# Patient Record
Sex: Female | Born: 1940
Health system: Southern US, Community
[De-identification: ages and names within clinical notes are randomized; demographics above are authoritative.]

## PROBLEM LIST (undated history)

## (undated) DIAGNOSIS — Z952 Presence of prosthetic heart valve: Secondary | ICD-10-CM

## (undated) DIAGNOSIS — I350 Nonrheumatic aortic (valve) stenosis: Secondary | ICD-10-CM

## (undated) DIAGNOSIS — D649 Anemia, unspecified: Secondary | ICD-10-CM

## (undated) DIAGNOSIS — I5032 Chronic diastolic (congestive) heart failure: Secondary | ICD-10-CM

## (undated) DIAGNOSIS — I700 Atherosclerosis of aorta: Secondary | ICD-10-CM

## (undated) DIAGNOSIS — J45909 Unspecified asthma, uncomplicated: Secondary | ICD-10-CM

## (undated) DIAGNOSIS — I10 Essential (primary) hypertension: Secondary | ICD-10-CM

## (undated) DIAGNOSIS — I4819 Other persistent atrial fibrillation: Secondary | ICD-10-CM

## (undated) DIAGNOSIS — I2510 Atherosclerotic heart disease of native coronary artery without angina pectoris: Secondary | ICD-10-CM

## (undated) DIAGNOSIS — E119 Type 2 diabetes mellitus without complications: Secondary | ICD-10-CM

## (undated) DIAGNOSIS — E785 Hyperlipidemia, unspecified: Secondary | ICD-10-CM

## (undated) HISTORY — PX: TUBAL LIGATION: SHX77

---

## 2014-09-03 DIAGNOSIS — I2510 Atherosclerotic heart disease of native coronary artery without angina pectoris: Secondary | ICD-10-CM

## 2014-09-03 HISTORY — DX: Atherosclerotic heart disease of native coronary artery without angina pectoris: I25.10

## 2014-09-25 ENCOUNTER — Encounter (HOSPITAL_COMMUNITY): Payer: Self-pay | Admitting: *Deleted

## 2014-09-25 ENCOUNTER — Inpatient Hospital Stay (HOSPITAL_COMMUNITY)
Admission: AD | Admit: 2014-09-25 | Discharge: 2014-09-28 | DRG: 286 | Disposition: A | Discharge status: Home / Self Care | Payer: Medicare Other | Source: Other Acute Inpatient Hospital | Attending: Cardiovascular Disease | Admitting: Cardiovascular Disease

## 2014-09-25 DIAGNOSIS — Z79899 Other long term (current) drug therapy: Secondary | ICD-10-CM | POA: Diagnosis not present

## 2014-09-25 DIAGNOSIS — I501 Left ventricular failure: Secondary | ICD-10-CM | POA: Insufficient documentation

## 2014-09-25 DIAGNOSIS — Z794 Long term (current) use of insulin: Secondary | ICD-10-CM

## 2014-09-25 DIAGNOSIS — Z7982 Long term (current) use of aspirin: Secondary | ICD-10-CM | POA: Diagnosis not present

## 2014-09-25 DIAGNOSIS — J810 Acute pulmonary edema: Secondary | ICD-10-CM

## 2014-09-25 DIAGNOSIS — R079 Chest pain, unspecified: Secondary | ICD-10-CM | POA: Diagnosis present

## 2014-09-25 DIAGNOSIS — I10 Essential (primary) hypertension: Secondary | ICD-10-CM | POA: Diagnosis present

## 2014-09-25 DIAGNOSIS — I2510 Atherosclerotic heart disease of native coronary artery without angina pectoris: Secondary | ICD-10-CM | POA: Diagnosis present

## 2014-09-25 DIAGNOSIS — I214 Non-ST elevation (NSTEMI) myocardial infarction: Secondary | ICD-10-CM

## 2014-09-25 DIAGNOSIS — Z880 Allergy status to penicillin: Secondary | ICD-10-CM | POA: Diagnosis not present

## 2014-09-25 DIAGNOSIS — E119 Type 2 diabetes mellitus without complications: Secondary | ICD-10-CM

## 2014-09-25 DIAGNOSIS — E669 Obesity, unspecified: Secondary | ICD-10-CM | POA: Diagnosis present

## 2014-09-25 DIAGNOSIS — I248 Other forms of acute ischemic heart disease: Secondary | ICD-10-CM | POA: Diagnosis present

## 2014-09-25 DIAGNOSIS — Z8249 Family history of ischemic heart disease and other diseases of the circulatory system: Secondary | ICD-10-CM | POA: Diagnosis not present

## 2014-09-25 DIAGNOSIS — I5032 Chronic diastolic (congestive) heart failure: Secondary | ICD-10-CM | POA: Diagnosis present

## 2014-09-25 DIAGNOSIS — Z789 Other specified health status: Secondary | ICD-10-CM

## 2014-09-25 DIAGNOSIS — Z882 Allergy status to sulfonamides status: Secondary | ICD-10-CM | POA: Diagnosis not present

## 2014-09-25 DIAGNOSIS — Z6841 Body Mass Index (BMI) 40.0 and over, adult: Secondary | ICD-10-CM | POA: Diagnosis not present

## 2014-09-25 DIAGNOSIS — I2489 Other forms of acute ischemic heart disease: Secondary | ICD-10-CM | POA: Diagnosis present

## 2014-09-25 DIAGNOSIS — E785 Hyperlipidemia, unspecified: Secondary | ICD-10-CM | POA: Diagnosis present

## 2014-09-25 DIAGNOSIS — Z888 Allergy status to other drugs, medicaments and biological substances status: Secondary | ICD-10-CM

## 2014-09-25 DIAGNOSIS — Z885 Allergy status to narcotic agent status: Secondary | ICD-10-CM | POA: Diagnosis not present

## 2014-09-25 DIAGNOSIS — I5031 Acute diastolic (congestive) heart failure: Secondary | ICD-10-CM | POA: Diagnosis present

## 2014-09-25 DIAGNOSIS — I5033 Acute on chronic diastolic (congestive) heart failure: Secondary | ICD-10-CM | POA: Diagnosis present

## 2014-09-25 HISTORY — DX: Essential (primary) hypertension: I10

## 2014-09-25 HISTORY — DX: Type 2 diabetes mellitus without complications: E11.9

## 2014-09-25 LAB — GLUCOSE, CAPILLARY: Glucose-Capillary: 129 mg/dL — ABNORMAL HIGH (ref 70–99)

## 2014-09-25 MED ORDER — INSULIN ASPART 100 UNIT/ML ~~LOC~~ SOLN: 0.0000 [IU] | Freq: Every day | SUBCUTANEOUS | Status: DC

## 2014-09-25 MED ORDER — ONDANSETRON HCL 4 MG/2ML IJ SOLN: 4.0000 mg | Freq: Four times a day (QID) | INTRAMUSCULAR | Status: DC | PRN

## 2014-09-25 MED ORDER — LOSARTAN POTASSIUM 50 MG PO TABS
50.0000 mg | ORAL_TABLET | Freq: Every day | ORAL | Status: DC
  Administered 2014-09-26 – 2014-09-28 (×3): 50 mg via ORAL
  Filled 2014-09-25 (×3): qty 1

## 2014-09-25 MED ORDER — METOPROLOL TARTRATE 50 MG PO TABS
50.0000 mg | ORAL_TABLET | Freq: Two times a day (BID) | ORAL | Status: DC
  Administered 2014-09-26 – 2014-09-27 (×4): 50 mg via ORAL
  Filled 2014-09-25 (×5): qty 1

## 2014-09-25 MED ORDER — CETYLPYRIDINIUM CHLORIDE 0.05 % MT LIQD: 7.0000 mL | Freq: Two times a day (BID) | OROMUCOSAL | Status: DC

## 2014-09-25 MED ORDER — ASPIRIN EC 81 MG PO TBEC
81.0000 mg | DELAYED_RELEASE_TABLET | Freq: Every day | ORAL | Status: DC
  Administered 2014-09-26: 81 mg via ORAL
  Filled 2014-09-25: qty 1

## 2014-09-25 MED ORDER — LOSARTAN POTASSIUM-HCTZ 50-12.5 MG PO TABS: 1.0000 | ORAL_TABLET | Freq: Every day | ORAL | Status: DC

## 2014-09-25 MED ORDER — FUROSEMIDE 10 MG/ML IJ SOLN
80.0000 mg | Freq: Once | INTRAMUSCULAR | Status: AC
  Administered 2014-09-26: 80 mg via INTRAVENOUS
  Filled 2014-09-25: qty 8

## 2014-09-25 MED ORDER — ACETAMINOPHEN 325 MG PO TABS
650.0000 mg | ORAL_TABLET | ORAL | Status: DC | PRN
  Administered 2014-09-26: 650 mg via ORAL
  Filled 2014-09-25: qty 2

## 2014-09-25 MED ORDER — INSULIN GLARGINE 100 UNIT/ML ~~LOC~~ SOLN
25.0000 [IU] | Freq: Every day | SUBCUTANEOUS | Status: DC
  Administered 2014-09-26 – 2014-09-28 (×3): 25 [IU] via SUBCUTANEOUS
  Filled 2014-09-25 (×3): qty 0.25

## 2014-09-25 MED ORDER — NITROGLYCERIN 0.4 MG SL SUBL: 0.4000 mg | SUBLINGUAL_TABLET | SUBLINGUAL | Status: DC | PRN

## 2014-09-25 MED ORDER — OMEGA-3-ACID ETHYL ESTERS 1 G PO CAPS
3.0000 g | ORAL_CAPSULE | Freq: Two times a day (BID) | ORAL | Status: DC
  Administered 2014-09-26 – 2014-09-28 (×5): 3 g via ORAL
  Filled 2014-09-25 (×8): qty 3

## 2014-09-25 MED ORDER — HYDROCHLOROTHIAZIDE 12.5 MG PO CAPS
12.5000 mg | ORAL_CAPSULE | Freq: Every day | ORAL | Status: DC
  Administered 2014-09-26 – 2014-09-28 (×3): 12.5 mg via ORAL
  Filled 2014-09-25 (×3): qty 1

## 2014-09-25 MED ORDER — INSULIN ASPART 100 UNIT/ML ~~LOC~~ SOLN
0.0000 [IU] | Freq: Three times a day (TID) | SUBCUTANEOUS | Status: DC
  Administered 2014-09-26: 3 [IU] via SUBCUTANEOUS
  Administered 2014-09-26 – 2014-09-27 (×3): 2 [IU] via SUBCUTANEOUS

## 2014-09-25 NOTE — H&P (Signed)
PCP:   No primary care provider on file.   Chief Complaint:  Chest pain and shortness of breath  HPI: This is 74 year old Caucasian female with past medical history of hypertension, hyperlipidemia, diabetes mellitus type 2 on insulin who was transferred from outside emergency department with chest pain, shortness of breath and mildly elevated troponin. Patient will show blinks no when she started having 10 out of 10 substernal heaviness and pressure as if elephant was seated in her chest. She also developed significant shortness of breath and wasn't able to catch her breath at all. Her neighbor called an ambulance and patient was transferred to the nearest emergency department. Patient reported that her symptoms lasted for at least 30 minutes until interventions in the outside emergency department. Patient's workup over there was notable for pulmonary venous congestion on the chest x-ray, unremarkable EKG, mildly elevated troponin. Start on heparin drip, given Nitropaste and transferred to [facility] The time of my evaluation patient reported mild dyspnea if she takes oxygen off, she also reported 0.5 out of 10 heaviness in her chest. Patient denied active symptoms of  PND orthopnea, lower extremity edema, frequent or prolonged palpitations, nausea vomiting, dysuria, diarrhea, fevers, chills  Review of Systems:  The patient denies anorexia, fever, weight loss,, vision loss, decreased hearing, hoarseness, chest pain, syncope, dyspnea on exertion, peripheral edema, balance deficits, hemoptysis, abdominal pain, melena, hematochezia, severe indigestion/heartburn, hematuria, incontinence, genital sores, muscle weakness, suspicious skin lesions, transient blindness, difficulty walking, depression, unusual weight change, abnormal bleeding, enlarged lymph nodes, angioedema, and breast masses.  Past Medical History: Past Medical History  Diagnosis Date  . Hypertension   . Diabetes mellitus without  complication     type 2  . Heart murmur    Past Surgical History  Procedure Laterality Date  . No past surgeries      Medications: Prior to Admission medications   Medication Sig Start Date End Date Taking? Authorizing Provider  aspirin 325 MG tablet Take 325 mg by mouth daily.   Yes Historical Provider, MD  insulin glargine (LANTUS) 100 UNIT/ML injection Inject 25 Units into the skin daily.   Yes Historical Provider, MD  losartan-hydrochlorothiazide (HYZAAR) 50-12.5 MG per tablet Take 1 tablet by mouth daily.   Yes Historical Provider, MD  metFORMIN (GLUCOPHAGE) 500 MG tablet Take 1,000 mg by mouth 2 (two) times daily with a meal.   Yes Historical Provider, MD  metoprolol (LOPRESSOR) 50 MG tablet Take 50 mg by mouth 2 (two) times daily.   Yes Historical Provider, MD  Multiple Vitamin (MULTIVITAMIN) tablet Take 1 tablet by mouth daily.   Yes Historical Provider, MD  omega-3 acid ethyl esters (LOVAZA) 1 G capsule Take 3 g by mouth 2 (two) times daily.   Yes Historical Provider, MD    Allergies:   Allergies  Allergen Reactions  . Penicillins Anaphylaxis  . Statins Other (See Comments)    Myalgias   . Codeine   . Prednisone Other (See Comments)    psychosis   . Lidocaine Palpitations  . Sulfa Antibiotics Rash    Social History:  reports that she has never smoked. She does not have any smokeless tobacco history on file. She reports that she does not drink alcohol or use illicit drugs.  Family History: Family History  Problem Relation Age of Onset  . Heart attack Mother 4  . Heart attack Brother 47    PHYSICAL EXAM:  Filed Vitals:   09/25/14 2200 09/25/14 2215 09/25/14 2230  BP:  189/78 138/69 134/71  Pulse: 92    Temp: 98.6 F (37 C)    TempSrc: Oral    Resp: 28 20 19   Height: 4\' 10"  (1.473 m)    Weight: 96.3 kg (212 lb 4.9 oz)    SpO2: 97% 98% 96%   General:  Well appearing. No respiratory difficulty HEENT: normal Neck: supple. no JVD. Carotids 2+ bilat; no  bruits. No lymphadenopathy or thryomegaly appreciated. Cor: PMI nondisplaced. Regular rate & rhythm. No rubs, gallops or murmurs. Lungs: bi-basilar crackles, B-lines bilaterally on the lung ultrasound Abdomen: soft, nontender, nondistended. No hepatosplenomegaly. No bruits or masses. Good bowel sounds. Extremities: no cyanosis, clubbing, rash, 1 +edema Neuro: alert & oriented x 3, cranial nerves grossly intact. moves all 4 extremities w/o difficulty. Affect pleasant.  Labs on Admission:  White cell count 13.60m hemoglobin 13.4; hematocrit 43.8; platelets 197 Sodium 139, potassium 4.1, chloride 101, CO2 28, BUN 22, creatinine 0.71 glucose 142, calcium 9.2, AST 42; ALT 49 albumin 3.9  EKG personally reviewed and interpreted by me: Normal sinus rhythm 69 bpm, normal axis, no acute ST deviations Chest x-ray report from outside emergency department indicated small lateral pleural effusions with signs of pulmonary venous congestion  Assessment/Plan Present on Admission:  . NSTEMI (non-ST elevated myocardial infarction) . Acute cardiogenic pulmonary edema Diabetes mellitus type 2 insulin-dependent Hyperlipidemia (stain intolerant) Hypertension  Plan: Admit to step down Continue hydrochlorothiazide, losartan, metoprolol, Lovaza for hypertension and hyperlipidemia Lasix IV 80 mg 1 for acute pulmonary edema Heparin drip Continue Nitropaste, may switch to nitroglycerin drip later Lantus continue home dose for diabetes as well as sliding scale Check TSH, A1c, BNP in the morning A.m. labs in the morning     Declin Rajan 09/25/2014, 11:25 PM

## 2014-09-26 DIAGNOSIS — I214 Non-ST elevation (NSTEMI) myocardial infarction: Secondary | ICD-10-CM

## 2014-09-26 LAB — GLUCOSE, CAPILLARY
Glucose-Capillary: 155 mg/dL — ABNORMAL HIGH (ref 70–99)
Glucose-Capillary: 144 mg/dL — ABNORMAL HIGH (ref 70–99)
Glucose-Capillary: 153 mg/dL — ABNORMAL HIGH (ref 70–99)
Glucose-Capillary: 169 mg/dL — ABNORMAL HIGH (ref 70–99)

## 2014-09-26 LAB — LIPID PANEL
Cholesterol: 219 mg/dL — ABNORMAL HIGH (ref 0–200)
HDL: 43 mg/dL (ref >39)
LDL Cholesterol: 130 mg/dL — ABNORMAL HIGH (ref 0–99)
Total CHOL/HDL Ratio: 5.1 RATIO
Triglycerides: 231 mg/dL — ABNORMAL HIGH (ref <150)
VLDL: 46 mg/dL — ABNORMAL HIGH (ref 0–40)

## 2014-09-26 LAB — BRAIN NATRIURETIC PEPTIDE: B Natriuretic Peptide: 278.5 pg/mL — ABNORMAL HIGH (ref 0.0–100.0)

## 2014-09-26 LAB — BASIC METABOLIC PANEL
Anion gap: 14 (ref 5–15)
BUN: 24 mg/dL — ABNORMAL HIGH (ref 6–23)
CO2: 28 mmol/L (ref 19–32)
Calcium: 9.4 mg/dL (ref 8.4–10.5)
Chloride: 99 mmol/L (ref 96–112)
Creatinine, Ser: 0.78 mg/dL (ref 0.50–1.10)
GFR calc Af Amer: >90 mL/min (ref >90)
GFR calc non Af Amer: 82 mL/min — ABNORMAL LOW (ref >90)
Glucose, Bld: 140 mg/dL — ABNORMAL HIGH (ref 70–99)
Potassium: 4.2 mmol/L (ref 3.5–5.1)
Sodium: 141 mmol/L (ref 135–145)

## 2014-09-26 LAB — MRSA PCR SCREENING: MRSA by PCR: NEGATIVE

## 2014-09-26 LAB — TROPONIN I
Troponin I: 0.35 ng/mL — ABNORMAL HIGH (ref <0.031)
Troponin I: 0.19 ng/mL — ABNORMAL HIGH (ref <0.031)

## 2014-09-26 LAB — HEMOGLOBIN A1C
Hgb A1c MFr Bld: 6.7 % — ABNORMAL HIGH (ref <5.7)
Mean Plasma Glucose: 146 mg/dL — ABNORMAL HIGH (ref <117)

## 2014-09-26 LAB — HEPARIN LEVEL (UNFRACTIONATED)
Heparin Unfractionated: 0.29 IU/mL — ABNORMAL LOW (ref 0.30–0.70)
Heparin Unfractionated: 0.18 IU/mL — ABNORMAL LOW (ref 0.30–0.70)

## 2014-09-26 LAB — MAGNESIUM: Magnesium: 1.7 mg/dL (ref 1.5–2.5)

## 2014-09-26 LAB — TSH: TSH: 1.94 u[IU]/mL (ref 0.350–4.500)

## 2014-09-26 MED ORDER — SODIUM CHLORIDE 0.9 % IJ SOLN: 3.0000 mL | INTRAMUSCULAR | Status: DC | PRN

## 2014-09-26 MED ORDER — SODIUM CHLORIDE 0.9 % IJ SOLN
3.0000 mL | Freq: Two times a day (BID) | INTRAMUSCULAR | Status: DC
  Administered 2014-09-26 – 2014-09-28 (×4): 3 mL via INTRAVENOUS

## 2014-09-26 MED ORDER — HEPARIN (PORCINE) IN NACL 100-0.45 UNIT/ML-% IJ SOLN
1400.0000 [IU]/h | INTRAMUSCULAR | Status: DC
  Administered 2014-09-26: 1400 [IU]/h via INTRAVENOUS
  Administered 2014-09-26: 1100 [IU]/h via INTRAVENOUS
  Filled 2014-09-26 (×5): qty 250

## 2014-09-26 MED ORDER — ASPIRIN EC 81 MG PO TBEC
81.0000 mg | DELAYED_RELEASE_TABLET | Freq: Every day | ORAL | Status: DC
Start: 2014-09-28 — End: 2014-09-28
  Administered 2014-09-28: 81 mg via ORAL
  Filled 2014-09-26: qty 1

## 2014-09-26 MED ORDER — ASPIRIN 81 MG PO CHEW
81.0000 mg | CHEWABLE_TABLET | ORAL | Status: AC
  Administered 2014-09-27: 81 mg via ORAL
  Filled 2014-09-26: qty 1

## 2014-09-26 MED ORDER — SODIUM CHLORIDE 0.9 % IV SOLN: 250.0000 mL | INTRAVENOUS | Status: DC | PRN

## 2014-09-26 NOTE — Progress Notes (Signed)
ANTICOAGULATION CONSULT NOTE - Initial Consult  Pharmacy Consult for Heparin Indication: chest pain/ACS  Allergies  Allergen Reactions  . Penicillins Anaphylaxis  . Statins Other (See Comments)    Myalgias   . Codeine   . Prednisone Other (See Comments)    psychosis   . Lidocaine Palpitations  . Sulfa Antibiotics Rash    Patient Measurements: Height: 4\' 10"  (147.3 cm) Weight: 212 lb 4.9 oz (96.3 kg) IBW/kg (Calculated) : 40.9 Heparin Dosing Weight: 65 kg  Vital Signs: Temp: 98.6 F (37 C) (01/23 2200) Temp Source: Oral (01/23 2300) BP: 145/82 mmHg (01/23 2300) Pulse Rate: 77 (01/23 2300)  Labs (at [facility]): WBC 13.1 Hgb 13.4 Hct 43.8 Plt 197  SCr 0.71   No results for input(s): HGB, HCT, PLT, APTT, LABPROT, INR, HEPARINUNFRC, CREATININE, CKTOTAL, CKMB, TROPONINI in the last 72 hours.  CrCl cannot be calculated (Patient has no serum creatinine result on file.).   Medical History: Past Medical History  Diagnosis Date  . Hypertension   . Diabetes mellitus without complication     type 2  . Heart murmur     Medications:  ASA  Lantus  Cozaar  Metformin  Lopressor  MVI  Lovaza  Assessment: 74 yo female with chest pain/SOB for heparin.  Heparin 7000 units IV bolus and 1450 units/hr started at OSH at 1945.    Goal of Therapy:  Heparin level 0.3-0.7 units/ml Monitor platelets by anticoagulation protocol: Yes   Plan:  Decrease heparin 1100 units/hr Check heparin level in 8 hours.   Eddie Candle 09/26/2014,12:02 AM

## 2014-09-26 NOTE — Progress Notes (Signed)
ANTICOAGULATION CONSULT NOTE - Follow Up Consult  Pharmacy Consult for Heparin Indication: chest pain/ACS  Allergies  Allergen Reactions  . Penicillins Anaphylaxis  . Statins Other (See Comments)    Myalgias   . Codeine   . Prednisone Other (See Comments)    psychosis   . Lidocaine Palpitations  . Sulfa Antibiotics Rash    Patient Measurements: Height: 4\' 10"  (147.3 cm) Weight: 211 lb 3.2 oz (95.8 kg) IBW/kg (Calculated) : 40.9 Heparin Dosing Weight: 65 kg  Vital Signs: Temp: 97.9 F (36.6 C) (01/24 0831) Temp Source: Oral (01/24 0831) BP: 124/78 mmHg (01/24 0831) Pulse Rate: 65 (01/24 0831)  Labs:  Recent Labs  09/26/14 0030 09/26/14 0645 09/26/14 0745  HEPARINUNFRC  --   --  0.29*  CREATININE  --  0.78  --   TROPONINI 0.35* 0.19*  --     Estimated Creatinine Clearance: 63.1 mL/min (by C-G formula based on Cr of 0.78).   Medications:  Scheduled:  . antiseptic oral rinse  7 mL Mouth Rinse BID  . aspirin EC  81 mg Oral Daily  . losartan  50 mg Oral Daily   And  . hydrochlorothiazide  12.5 mg Oral Daily  . insulin aspart  0-15 Units Subcutaneous TID WC  . insulin aspart  0-5 Units Subcutaneous QHS  . insulin glargine  25 Units Subcutaneous Daily  . metoprolol  50 mg Oral BID  . omega-3 acid ethyl esters  3 g Oral BID   Infusions:  . heparin 1,100 Units/hr (09/26/14 0031)    Assessment: 74 yo f admitted on 1/23 for CP/SOB as a transfer from [place].  HL this AM was just slightly subtherapeutic at 0.29 on 1100 units/hr. No CBC currently (one ordered for tomorrow AM).   Goal of Therapy:  Heparin level 0.3-0.7 units/ml Monitor platelets by anticoagulation protocol: Yes   Plan:  Increase heparin infusion slightly to 1150 units/hr 8-hr HL @ 1730 Monitor hgb/plts, s/s of bleeding, plans for cath  Cassie L. Roseanne Reno, PharmD  Clinical Pharmacy Resident Pager: [phone] 09/26/2014 9:43 AM

## 2014-09-26 NOTE — Progress Notes (Signed)
Utilization review completed.  

## 2014-09-26 NOTE — Progress Notes (Addendum)
Patient ID: Taylor Jenkins, female   DOB: 03-23-1941, 74 y.o.   MRN: [id number]    Subjective:  Denies SSCP, palpitations or Dyspnea Transferred from [facility] last night   Objective:  Filed Vitals:   09/26/14 0000 09/26/14 0032 09/26/14 0326 09/26/14 0831  BP:  137/47 96/42 124/78  Pulse:  74 58 65  Temp:   97.5 F (36.4 C) 97.9 F (36.6 C)  TempSrc:   Oral Oral  Resp: 24  20 17   Height:      Weight:   95.8 kg (211 lb 3.2 oz)   SpO2: 98%  97% 94%    Intake/Output from previous day:  Intake/Output Summary (Last 24 hours) at 09/26/14 5993 Last data filed at 09/26/14 0500  Gross per 24 hour  Intake  49.32 ml  Output   1100 ml  Net -1050.68 ml    Physical Exam: Affect appropriate Overweight white female  HEENT: normal Neck supple with no adenopathy JVP normal no bruits no thyromegaly Lungs clear with no wheezing and good diaphragmatic motion Heart:  S1/S2 no murmur, no rub, gallop or click PMI normal Abdomen: benighn, BS positve, no tenderness, no AAA no bruit.  No HSM or HJR Distal pulses intact with no bruits No edema Neuro non-focal Skin warm and dry No muscular weakness   Lab Results: Basic Metabolic Panel:  Recent Labs  57/01/77 0645  NA 141  K 4.2  CL 99  CO2 28  GLUCOSE 140*  BUN 24*  CREATININE 0.78  CALCIUM 9.4  MG 1.7   Cardiac Enzymes:  Recent Labs  09/26/14 0030 09/26/14 0645  TROPONINI 0.35* 0.19*   Fasting Lipid Panel:  Recent Labs  09/26/14 0030  CHOL 219*  HDL 43  LDLCALC 130*  TRIG 231*  CHOLHDL 5.1   Thyroid Function Tests:  Recent Labs  09/26/14 0030  TSH 1.940    Imaging: No results found.  Cardiac Studies:  ECG:   Per Fellow not in Epic   Normal sinus rhythm 69 bpm, normal axis, no acute ST deviations   Telemetry:  NSR no arrhythmia   Echo:   Medications:   . antiseptic oral rinse  7 mL Mouth Rinse BID  . aspirin EC  81 mg Oral Daily  . losartan  50 mg Oral Daily   And  .  hydrochlorothiazide  12.5 mg Oral Daily  . insulin aspart  0-15 Units Subcutaneous TID WC  . insulin aspart  0-5 Units Subcutaneous QHS  . insulin glargine  25 Units Subcutaneous Daily  . metoprolol  50 mg Oral BID  . omega-3 acid ethyl esters  3 g Oral BID     . heparin 1,100 Units/hr (09/26/14 0031)    Assessment/Plan:  Chest Pressure:  Concern for angina with mild elevation in troponin and IDDM  Discussed cath with patient including risks of stroke , bleeding MI and need for  Emergency CABG  willing to proceed Orders written put on board for MC/PJ DM  Has lost 50lbs over last year on Atkins diet doing well At home just once daily lantus  SS in house Chol:  Intolerant to statins check fasting lipids consider Praluent  HTN:  On ARB     Charlton Haws 09/26/2014, 8:39 AM

## 2014-09-26 NOTE — Progress Notes (Signed)
ANTICOAGULATION CONSULT NOTE - Follow Up Consult  Pharmacy Consult for Heparin Indication: chest pain/ACS  Allergies  Allergen Reactions  . Penicillins Anaphylaxis  . Statins Other (See Comments)    Myalgias   . Codeine   . Prednisone Other (See Comments)    psychosis   . Lidocaine Palpitations  . Sulfa Antibiotics Rash    Patient Measurements: Height: 4\' 10"  (147.3 cm) Weight: 211 lb 3.2 oz (95.8 kg) IBW/kg (Calculated) : 40.9 Heparin Dosing Weight: 65 kg  Vital Signs: Temp: 98 F (36.7 C) (01/24 1532) Temp Source: Oral (01/24 1532) BP: 152/71 mmHg (01/24 1532) Pulse Rate: 57 (01/24 1125)  Labs:  Recent Labs  09/26/14 0030 09/26/14 0645 09/26/14 0745 09/26/14 1803  HEPARINUNFRC  --   --  0.29* 0.18*  CREATININE  --  0.78  --   --   TROPONINI 0.35* 0.19*  --   --     Estimated Creatinine Clearance: 63.1 mL/min (by C-G formula based on Cr of 0.78).   Medications:  Scheduled:  . aspirin EC  81 mg Oral Daily  . losartan  50 mg Oral Daily   And  . hydrochlorothiazide  12.5 mg Oral Daily  . insulin aspart  0-15 Units Subcutaneous TID WC  . insulin aspart  0-5 Units Subcutaneous QHS  . insulin glargine  25 Units Subcutaneous Daily  . metoprolol  50 mg Oral BID  . omega-3 acid ethyl esters  3 g Oral BID   Infusions:  . heparin 1,130 Units/hr (09/26/14 1000)    Assessment: 74 yo f admitted on 1/23 for CP/SOB as a transfer from [place].  HL this AM was just slightly subtherapeutic at 0.29 on 1100 units/hr. No CBC currently (one ordered for tomorrow AM).  Rate was increased to 1150 uts/hr but HL dropped 0.18.  RN reports IV line running without problems.  Goal of Therapy:  Heparin level 0.3-0.7 units/ml Monitor platelets by anticoagulation protocol: Yes   Plan:  Increase heparin infusion 1400 units/hr Daily HL, CBC  Leota Sauers Pharm.D. CPP, BCPS Clinical Pharmacist [phone] 09/26/2014 7:04 PM

## 2014-09-27 ENCOUNTER — Encounter (HOSPITAL_COMMUNITY)
Admission: AD | Disposition: A | Discharge status: Home / Self Care | Payer: Self-pay | Source: Other Acute Inpatient Hospital | Attending: Cardiovascular Disease

## 2014-09-27 ENCOUNTER — Encounter (HOSPITAL_COMMUNITY): Payer: Self-pay | Admitting: Cardiovascular Disease

## 2014-09-27 DIAGNOSIS — I10 Essential (primary) hypertension: Secondary | ICD-10-CM

## 2014-09-27 DIAGNOSIS — R7989 Other specified abnormal findings of blood chemistry: Secondary | ICD-10-CM

## 2014-09-27 DIAGNOSIS — I2510 Atherosclerotic heart disease of native coronary artery without angina pectoris: Secondary | ICD-10-CM

## 2014-09-27 DIAGNOSIS — R778 Other specified abnormalities of plasma proteins: Secondary | ICD-10-CM | POA: Insufficient documentation

## 2014-09-27 DIAGNOSIS — E785 Hyperlipidemia, unspecified: Secondary | ICD-10-CM

## 2014-09-27 HISTORY — PX: LEFT HEART CATHETERIZATION WITH CORONARY ANGIOGRAM: SHX5451

## 2014-09-27 LAB — BASIC METABOLIC PANEL
Anion gap: 13 (ref 5–15)
BUN: 26 mg/dL — ABNORMAL HIGH (ref 6–23)
CO2: 26 mmol/L (ref 19–32)
Calcium: 9.1 mg/dL (ref 8.4–10.5)
Chloride: 101 mmol/L (ref 96–112)
Creatinine, Ser: 0.74 mg/dL (ref 0.50–1.10)
GFR calc Af Amer: >90 mL/min (ref >90)
GFR calc non Af Amer: 83 mL/min — ABNORMAL LOW (ref >90)
Glucose, Bld: 109 mg/dL — ABNORMAL HIGH (ref 70–99)
Potassium: 3.7 mmol/L (ref 3.5–5.1)
Sodium: 140 mmol/L (ref 135–145)

## 2014-09-27 LAB — PROTIME-INR
INR: 1.3 (ref 0.00–1.49)
Prothrombin Time: 16.3 seconds — ABNORMAL HIGH (ref 11.6–15.2)

## 2014-09-27 LAB — HEPARIN LEVEL (UNFRACTIONATED): Heparin Unfractionated: 0.31 IU/mL (ref 0.30–0.70)

## 2014-09-27 LAB — CBC
HCT: 39.2 % (ref 36.0–46.0)
Hemoglobin: 12.5 g/dL (ref 12.0–15.0)
MCH: 27.2 pg (ref 26.0–34.0)
MCHC: 31.9 g/dL (ref 30.0–36.0)
MCV: 85.2 fL (ref 78.0–100.0)
Platelets: 178 10*3/uL (ref 150–400)
RBC: 4.6 MIL/uL (ref 3.87–5.11)
RDW: 15.3 % (ref 11.5–15.5)
WBC: 8.5 10*3/uL (ref 4.0–10.5)

## 2014-09-27 LAB — GLUCOSE, CAPILLARY
Glucose-Capillary: 123 mg/dL — ABNORMAL HIGH (ref 70–99)
Glucose-Capillary: 96 mg/dL (ref 70–99)

## 2014-09-27 SURGERY — LEFT HEART CATHETERIZATION WITH CORONARY ANGIOGRAM
Anesthesia: LOCAL

## 2014-09-27 MED ORDER — METOPROLOL TARTRATE 50 MG PO TABS
75.0000 mg | ORAL_TABLET | Freq: Two times a day (BID) | ORAL | Status: DC
  Administered 2014-09-27 – 2014-09-28 (×2): 75 mg via ORAL
  Filled 2014-09-27 (×3): qty 1

## 2014-09-27 MED ORDER — MIDAZOLAM HCL 2 MG/2ML IJ SOLN
INTRAMUSCULAR | Status: AC
  Filled 2014-09-27: qty 2

## 2014-09-27 MED ORDER — HEPARIN (PORCINE) IN NACL 2-0.9 UNIT/ML-% IJ SOLN
INTRAMUSCULAR | Status: AC
Start: 2014-09-27 — End: 2014-09-27
  Filled 2014-09-27: qty 1500

## 2014-09-27 MED ORDER — NITROGLYCERIN 1 MG/10 ML FOR IR/CATH LAB
INTRA_ARTERIAL | Status: AC
  Filled 2014-09-27: qty 10

## 2014-09-27 MED ORDER — SODIUM CHLORIDE 0.9 % IV SOLN
INTRAVENOUS | Status: DC
  Administered 2014-09-27: 08:00:00 via INTRAVENOUS

## 2014-09-27 MED ORDER — BUPIVACAINE HCL (PF) 0.25 % IJ SOLN
INTRAMUSCULAR | Status: AC
  Filled 2014-09-27: qty 30

## 2014-09-27 MED ORDER — FENTANYL CITRATE 0.05 MG/ML IJ SOLN
INTRAMUSCULAR | Status: AC
  Filled 2014-09-27: qty 2

## 2014-09-27 MED ORDER — HEPARIN SODIUM (PORCINE) 1000 UNIT/ML IJ SOLN
INTRAMUSCULAR | Status: AC
  Filled 2014-09-27: qty 1

## 2014-09-27 NOTE — Interval H&P Note (Signed)
History and Physical Interval Note:  09/27/2014 10:04 AM  Taylor Jenkins  has presented today for surgery, with the diagnosis of NSTEMI  The various methods of treatment have been discussed with the patient and family. After consideration of risks, benefits and other options for treatment, the patient has consented to  Procedure(s): LEFT HEART CATHETERIZATION WITH CORONARY ANGIOGRAM (N/A) as a surgical intervention .  The patient's history has been reviewed, patient examined, no change in status, stable for surgery.  I have reviewed the patient's chart and labs.  Questions were answered to the patient's satisfaction.    Cath Lab Visit (complete for each Cath Lab visit)  Clinical Evaluation Leading to the Procedure:   ACS: Yes.    Non-ACS:    Anginal Classification: CCS IV  Anti-ischemic medical therapy: Minimal Therapy (1 class of medications)  Non-Invasive Test Results: No non-invasive testing performed  Prior CABG: No previous CABG       Tonny Bollman

## 2014-09-27 NOTE — Progress Notes (Signed)
ANTICOAGULATION CONSULT NOTE - Follow Up Consult  Pharmacy Consult for Heparin  Indication: chest pain/ACS  Allergies  Allergen Reactions  . Penicillins Anaphylaxis  . Statins Other (See Comments)    Myalgias   . Codeine   . Prednisone Other (See Comments)    psychosis   . Lidocaine Palpitations  . Sulfa Antibiotics Rash    Patient Measurements: Height: 4\' 10"  (147.3 cm) Weight: 211 lb 3.2 oz (95.8 kg) IBW/kg (Calculated) : 40.9  Vital Signs: Temp: 97.7 F (36.5 C) (01/25 0300) Temp Source: Oral (01/25 0300) BP: 110/39 mmHg (01/25 0300) Pulse Rate: 66 (01/25 0300)  Labs:  Recent Labs  09/26/14 0030 09/26/14 0645 09/26/14 0745 09/26/14 1803 09/27/14 0240  HGB  --   --   --   --  12.5  HCT  --   --   --   --  39.2  PLT  --   --   --   --  178  HEPARINUNFRC  --   --  0.29* 0.18* 0.31  CREATININE  --  0.78  --   --  0.74  TROPONINI 0.35* 0.19*  --   --   --     Estimated Creatinine Clearance: 63.1 mL/min (by C-G formula based on Cr of 0.74).   Assessment: Therapeutic heparin level x 1, other labs as above, looks like plan is for cath today.   Goal of Therapy:  Heparin level 0.3-0.7 units/ml Monitor platelets by anticoagulation protocol: Yes   Plan:  -Continue heparin at 1400 units/hr -1000 HL, pending cath -Daily CBC/HL -Monitor for bleeding  Abran Duke 09/27/2014,5:06 AM

## 2014-09-27 NOTE — CV Procedure (Signed)
    Cardiac Catheterization Procedure Note  Name: Taylor Jenkins MRN: [id number] DOB: [birth year]  Procedure: Left Heart Cath, Selective Coronary Angiography, LV angiography  Indication: NSTEMI/heart failure   Procedural Details: The right wrist was prepped, draped, and anesthetized with 1% lidocaine. Using the modified Seldinger technique, a 5/6 French Slender sheath was introduced into the right radial artery. 3 mg of verapamil was administered through the sheath, weight-based unfractionated heparin was administered intravenously. Standard Judkins catheters were used for selective coronary angiography and left ventriculography. Catheter exchanges were performed over an exchange length guidewire. There were no immediate procedural complications. A TR band was used for radial hemostasis at the completion of the procedure.  The patient was transferred to the post catheterization recovery area for further monitoring.  Procedural Findings: Hemodynamics: AO 122/58 LV 122/58  Coronary angiography: Coronary dominance: right  Left mainstem: The left mainstem is patent. The vessel arises from the left cusp and it has mild distal narrowing of about 30%.  Left anterior descending (LAD): The LAD is patent to the apex of the heart. There are diffuse irregularities. The mid vessel has 40-50% stenosis. The proximal vessel has diffuse irregularity with 20-30% stenosis. The diagonal branches are patent.  Left circumflex (LCx): The left circumflex is a large caliber vessel. The ostium has 30% stenosis. The mid circumflex has 30-40% stenosis. The obtuse marginal branches are patent.  Right coronary artery (RCA): This is a dominant vessel. There is no significant obstruction noted. There are minimal irregularities in the proximal, mid, and distal vessel. The PDA and PLA branches are patent.  Left ventriculography: Left ventricular systolic function is vigorous, LVEF is estimated at 75 %, there is no  significant mitral regurgitation   Estimated Blood Loss: Minimal  Final Conclusions:   1. Mild diffuse nonobstructive coronary artery disease 2. Hyperdynamic LV systolic function  Recommendations: Suspect patient's symptoms related to obesity and diastolic heart failure. Recommend medical therapy for nonobstructive coronary artery disease.  Tonny Bollman MD, [facility] 09/27/2014, 10:38 AM

## 2014-09-27 NOTE — Progress Notes (Signed)
PROGRESS NOTE  Subjective:   Taylor Jenkins is a 74 yo with hx of HTN,hyperlipidemia, DM2 who was transferred for furthere evaluation of CP and minimally elevated Troponin levels .  Cath showed mild CAD with vigorous LV function.     Objective:    Vital Signs:   Temp:  [97.3 F (36.3 C)-98.7 F (37.1 C)] 97.3 F (36.3 C) (01/25 1130) Pulse Rate:  [54-66] 63 (01/25 1130) Resp:  [14-20] 20 (01/25 1120) BP: (101-152)/(38-87) 137/87 mmHg (01/25 1120) SpO2:  [92 %-97 %] 97 % (01/25 1120) Weight:  [210 lb 5.1 oz (95.4 kg)] 210 lb 5.1 oz (95.4 kg) (01/25 0500)  Last BM Date: 09/25/14   24-hour weight change: Weight change: -1 lb 15.8 oz (-0.9 kg)  Weight trends: Filed Weights   09/25/14 2200 09/26/14 0326 09/27/14 0500  Weight: 212 lb 4.9 oz (96.3 kg) 211 lb 3.2 oz (95.8 kg) 210 lb 5.1 oz (95.4 kg)    Intake/Output:  01/24 0701 - 01/25 0700 In: 469.5 [P.O.:180; I.V.:289.5] Out: 2750 [Urine:2750] Total I/O In: 295.5 [I.V.:295.5] Out: -    Physical Exam: BP 137/87 mmHg  Pulse 63  Temp(Src) 97.3 F (36.3 C) (Oral)  Resp 20  Ht 4\' 10"  (1.473 m)  Wt 210 lb 5.1 oz (95.4 kg)  BMI 43.97 kg/m2  SpO2 97%  Wt Readings from Last 3 Encounters:  09/27/14 210 lb 5.1 oz (95.4 kg)    General: Vital signs reviewed and noted.   Head: Normocephalic, atraumatic.  Eyes: conjunctivae/corneas clear.  EOM's intact.   Throat: normal  Neck:  normal   Lungs:    clear  Heart:  Rr  Abdomen:  Soft, non-tender, non-distended  , mildly obese   Extremities: Right radial TR band in place    Neurologic: A&O X3, CN II - XII are grossly intact.   Psych: Normal     Labs: BMET:  Recent Labs  09/26/14 0645 09/27/14 0240  NA 141 140  K 4.2 3.7  CL 99 101  CO2 28 26  GLUCOSE 140* 109*  BUN 24* 26*  CREATININE 0.78 0.74  CALCIUM 9.4 9.1  MG 1.7  --     Liver function tests: No results for input(s): AST, ALT, ALKPHOS, BILITOT, PROT, ALBUMIN in the last 72 hours. No results for  input(s): LIPASE, AMYLASE in the last 72 hours.  CBC:  Recent Labs  09/27/14 0240  WBC 8.5  HGB 12.5  HCT 39.2  MCV 85.2  PLT 178    Cardiac Enzymes:  Recent Labs  09/26/14 0030 09/26/14 0645  TROPONINI 0.35* 0.19*    Coagulation Studies:  Recent Labs  09/27/14 0216  LABPROT 16.3*  INR 1.30    Other: Invalid input(s): POCBNP No results for input(s): DDIMER in the last 72 hours.  Recent Labs  09/26/14 0645  HGBA1C 6.7*    Recent Labs  09/26/14 0030  CHOL 219*  HDL 43  LDLCALC 130*  TRIG 231*  CHOLHDL 5.1    Recent Labs  09/26/14 0030  TSH 1.940   No results for input(s): VITAMINB12, FOLATE, FERRITIN, TIBC, IRON, RETICCTPCT in the last 72 hours.   Other results:  EKG :    NSR , no ST or T wave changes.   Medications:    Infusions: . sodium chloride 75 mL/hr at 09/27/14 1583    Scheduled Medications: . [START ON 09/28/2014] aspirin EC  81 mg Oral Daily  . losartan  50 mg Oral Daily  And  . hydrochlorothiazide  12.5 mg Oral Daily  . insulin aspart  0-15 Units Subcutaneous TID WC  . insulin aspart  0-5 Units Subcutaneous QHS  . insulin glargine  25 Units Subcutaneous Daily  . metoprolol  50 mg Oral BID  . omega-3 acid ethyl esters  3 g Oral BID  . sodium chloride  3 mL Intravenous Q12H   I have reviewed the cardiac cath and discussed with Dr. Excell Seltzer.    Assessment/ Plan:   1. Chest pressure:  Associated with a minimal Troponin elevation. Cath shows minor coronary irreg.  I suspect her chest pressure was due to overexertion.   Will increase her metoprolol. She needs to start a regular exercise program. She denies any shortness of breath at rest. No evidence of Pulmonary embolus.    2. Mild Troponin elevation:  Not c/w a NSTEMI.  I suspect she had demand ischemia.  Coronaries have minor luminal irreg.   3.  HTN : continue medical therapy.  Increase metoprolol   4. DM - further plans per primary md   5. Hyperlipidemia:     Disposition: anticipate DC tomorrow.   Length of Stay: 2  Alvia Grove., MD, [facility] 09/27/2014, 12:13 PM Office [phone] Pager [phone]

## 2014-09-27 NOTE — H&P (View-Only) (Signed)
Patient ID: Taylor Jenkins, female   DOB: 09/28/1940, 73 y.o.   MRN: [id number]    Subjective:  Denies SSCP, palpitations or Dyspnea Transferred from [facility] last night   Objective:  Filed Vitals:   09/26/14 0000 09/26/14 0032 09/26/14 0326 09/26/14 0831  BP:  137/47 96/42 124/78  Pulse:  74 58 65  Temp:   97.5 F (36.4 C) 97.9 F (36.6 C)  TempSrc:   Oral Oral  Resp: 24  20 17  Height:      Weight:   95.8 kg (211 lb 3.2 oz)   SpO2: 98%  97% 94%    Intake/Output from previous day:  Intake/Output Summary (Last 24 hours) at 09/26/14 0839 Last data filed at 09/26/14 0500  Gross per 24 hour  Intake  49.32 ml  Output   1100 ml  Net -1050.68 ml    Physical Exam: Affect appropriate Overweight white female  HEENT: normal Neck supple with no adenopathy JVP normal no bruits no thyromegaly Lungs clear with no wheezing and good diaphragmatic motion Heart:  S1/S2 no murmur, no rub, gallop or click PMI normal Abdomen: benighn, BS positve, no tenderness, no AAA no bruit.  No HSM or HJR Distal pulses intact with no bruits No edema Neuro non-focal Skin warm and dry No muscular weakness   Lab Results: Basic Metabolic Panel:  Recent Labs  09/26/14 0645  NA 141  K 4.2  CL 99  CO2 28  GLUCOSE 140*  BUN 24*  CREATININE 0.78  CALCIUM 9.4  MG 1.7   Cardiac Enzymes:  Recent Labs  09/26/14 0030 09/26/14 0645  TROPONINI 0.35* 0.19*   Fasting Lipid Panel:  Recent Labs  09/26/14 0030  CHOL 219*  HDL 43  LDLCALC 130*  TRIG 231*  CHOLHDL 5.1   Thyroid Function Tests:  Recent Labs  09/26/14 0030  TSH 1.940    Imaging: No results found.  Cardiac Studies:  ECG:   Per Fellow not in Epic   Normal sinus rhythm 69 bpm, normal axis, no acute ST deviations   Telemetry:  NSR no arrhythmia   Echo:   Medications:   . antiseptic oral rinse  7 mL Mouth Rinse BID  . aspirin EC  81 mg Oral Daily  . losartan  50 mg Oral Daily   And  .  hydrochlorothiazide  12.5 mg Oral Daily  . insulin aspart  0-15 Units Subcutaneous TID WC  . insulin aspart  0-5 Units Subcutaneous QHS  . insulin glargine  25 Units Subcutaneous Daily  . metoprolol  50 mg Oral BID  . omega-3 acid ethyl esters  3 g Oral BID     . heparin 1,100 Units/hr (09/26/14 0031)    Assessment/Plan:  Chest Pressure:  Concern for angina with mild elevation in troponin and IDDM  Discussed cath with patient including risks of stroke , bleeding MI and need for  Emergency CABG  willing to proceed Orders written put on board for MC/PJ DM  Has lost 50lbs over last year on Atkins diet doing well At home just once daily lantus  SS in house Chol:  Intolerant to statins check fasting lipids consider Praluent  HTN:  On ARB     Taylor Jenkins 09/26/2014, 8:39 AM     

## 2014-09-28 DIAGNOSIS — I2510 Atherosclerotic heart disease of native coronary artery without angina pectoris: Secondary | ICD-10-CM | POA: Diagnosis present

## 2014-09-28 DIAGNOSIS — Z789 Other specified health status: Secondary | ICD-10-CM

## 2014-09-28 DIAGNOSIS — I5031 Acute diastolic (congestive) heart failure: Secondary | ICD-10-CM

## 2014-09-28 DIAGNOSIS — E669 Obesity, unspecified: Secondary | ICD-10-CM | POA: Diagnosis present

## 2014-09-28 LAB — GLUCOSE, CAPILLARY
Glucose-Capillary: 81 mg/dL (ref 70–99)
Glucose-Capillary: 126 mg/dL — ABNORMAL HIGH (ref 70–99)
Glucose-Capillary: 104 mg/dL — ABNORMAL HIGH (ref 70–99)
Glucose-Capillary: 89 mg/dL (ref 70–99)

## 2014-09-28 MED ORDER — LOSARTAN POTASSIUM 50 MG PO TABS: 100.0000 mg | ORAL_TABLET | Freq: Every day | ORAL | Status: DC

## 2014-09-28 MED ORDER — HYDROCHLOROTHIAZIDE 25 MG PO TABS: 25.0000 mg | ORAL_TABLET | Freq: Every day | ORAL | Status: DC

## 2014-09-28 MED ORDER — ASPIRIN 81 MG PO TBEC: 81.0000 mg | DELAYED_RELEASE_TABLET | Freq: Every day | ORAL | Status: DC

## 2014-09-28 MED ORDER — ACETAMINOPHEN 325 MG PO TABS
650.0000 mg | ORAL_TABLET | ORAL | Status: DC | PRN
Start: 2014-09-28 — End: 2018-10-22

## 2014-09-28 MED ORDER — NITROGLYCERIN 0.4 MG SL SUBL: 0.4000 mg | SUBLINGUAL_TABLET | SUBLINGUAL | Status: DC | PRN

## 2014-09-28 MED ORDER — METFORMIN HCL 500 MG PO TABS: 1000.0000 mg | ORAL_TABLET | Freq: Two times a day (BID) | ORAL | Status: DC

## 2014-09-28 MED ORDER — METOPROLOL TARTRATE 50 MG PO TABS: 75.0000 mg | ORAL_TABLET | Freq: Two times a day (BID) | ORAL | Status: DC

## 2014-09-28 NOTE — Discharge Instructions (Signed)
Radial Site Care Refer to this sheet in the next few weeks. These instructions provide you with information on caring for yourself after your procedure. Your caregiver may also give you more specific instructions. Your treatment has been planned according to current medical practices, but problems sometimes occur. Call your caregiver if you have any problems or questions after your procedure. HOME CARE INSTRUCTIONS  You may shower the day after the procedure.Remove the bandage (dressing) and gently wash the site with plain soap and water.Gently pat the site dry.  Do not apply powder or lotion to the site.  Do not submerge the affected site in water for 3 to 5 days.  Inspect the site at least twice daily.  Do not flex or bend the affected arm for 24 hours.  No lifting over 5 pounds (2.3 kg) for 5 days after your procedure.  Do not drive home if you are discharged the same day of the procedure. Have someone else drive you.  You may drive 24 hours after the procedure unless otherwise instructed by your caregiver.  Do not operate machinery or power tools for 24 hours.  A responsible adult should be with you for the first 24 hours after you arrive home. What to expect:  Any bruising will usually fade within 1 to 2 weeks.  Blood that collects in the tissue (hematoma) may be painful to the touch. It should usually decrease in size and tenderness within 1 to 2 weeks. SEEK IMMEDIATE MEDICAL CARE IF:  You have unusual pain at the radial site.  You have redness, warmth, swelling, or pain at the radial site.  You have drainage (other than a small amount of blood on the dressing).  You have chills.  You have a fever or persistent symptoms for more than 72 hours.  You have a fever and your symptoms suddenly get worse.  Your arm becomes pale, cool, tingly, or numb.  You have heavy bleeding from the site. Hold pressure on the site. Document Released: 09/22/2010 Document Revised:  11/12/2011 Document Reviewed: 09/22/2010 [facility] Patient Information 2015 [place], [place]. This information is not intended to replace advice given to you by your health care provider. Make sure you discuss any questions you have with your health care provider. Hypertension Hypertension is another name for high blood pressure. High blood pressure forces your heart to work harder to pump blood. A blood pressure reading has two numbers, which includes a higher number over a lower number (example: 110/72). HOME CARE   Have your blood pressure rechecked by your doctor.  Only take medicine as told by your doctor. Follow the directions carefully. The medicine does not work as well if you skip doses. Skipping doses also puts you at risk for problems.  Do not smoke.  Monitor your blood pressure at home as told by your doctor. GET HELP IF:  You think you are having a reaction to the medicine you are taking.  You have repeat headaches or feel dizzy.  You have puffiness (swelling) in your ankles.  You have trouble with your vision. GET HELP RIGHT AWAY IF:   You get a very bad headache and are confused.  You feel weak, numb, or faint.  You get chest or belly (abdominal) pain.  You throw up (vomit).  You cannot breathe very well. MAKE SURE YOU:   Understand these instructions.  Will watch your condition.  Will get help right away if you are not doing well or get worse. Document Released: 02/06/2008  Document Revised: 08/25/2013 Document Reviewed: 06/12/2013 [facility] Patient Information 2015 [place], [place]. This information is not intended to replace advice given to you by your health care provider. Make sure you discuss any questions you have with your health care provider.

## 2014-09-28 NOTE — Care Management Note (Signed)
    Page 1 of 1   09/28/2014     11:05:50 AM CARE MANAGEMENT NOTE 09/28/2014  Patient:  Taylor Jenkins, Taylor Jenkins   Account Number:  [id number]  Date Initiated:  09/28/2014  Documentation initiated by:  Junius Creamer  Subjective/Objective Assessment:   adm w nstemi     Action/Plan:   lives at home   Anticipated DC Date:     Anticipated DC Plan:  HOME/SELF CARE         Choice offered to / List presented to:             Status of service:   Medicare Important Message given?  YES (If response is "NO", the following Medicare IM given date fields will be blank) Date Medicare IM given:  09/28/2014 Medicare IM given by:  Junius Creamer Date Additional Medicare IM given:   Additional Medicare IM given by:    Discharge Disposition:    Per UR Regulation:  Reviewed for med. necessity/level of care/duration of stay  If discussed at Long Length of Stay Meetings, dates discussed:    Comments:

## 2014-09-28 NOTE — Progress Notes (Addendum)
PROGRESS NOTE  Subjective:   Taylor Jenkins is a 74 yo with hx of HTN,hyperlipidemia, DM2 who was transferred for furthere evaluation of CP and minimally elevated Troponin levels .  Cath showed mild CAD with vigorous LV function.  She had an echo 1 month ago - by a local cardiologist in [place].  She does not know the exact results.    Objective:    Vital Signs:   Temp:  [97.3 F (36.3 C)-98 F (36.7 C)] 97.7 F (36.5 C) (01/26 0724) Pulse Rate:  [48-63] 61 (01/26 0724) Resp:  [14-20] 18 (01/26 0724) BP: (108-155)/(50-89) 144/57 mmHg (01/26 0724) SpO2:  [95 %-98 %] 95 % (01/26 0724) Weight:  [211 lb 13.8 oz (96.1 kg)] 211 lb 13.8 oz (96.1 kg) (01/26 0500)  Last BM Date: 09/25/14   24-hour weight change: Weight change: 1 lb 8.7 oz (0.7 kg)  Weight trends: Filed Weights   09/26/14 0326 09/27/14 0500 09/28/14 0500  Weight: 211 lb 3.2 oz (95.8 kg) 210 lb 5.1 oz (95.4 kg) 211 lb 13.8 oz (96.1 kg)    Intake/Output:  01/25 0701 - 01/26 0700 In: 1090.5 [P.O.:120; I.V.:970.5] Out: -  Total I/O In: 150 [P.O.:150] Out: -    Physical Exam: BP 144/57 mmHg  Pulse 61  Temp(Src) 97.7 F (36.5 C) (Oral)  Resp 18  Ht 4\' 10"  (1.473 m)  Wt 211 lb 13.8 oz (96.1 kg)  BMI 44.29 kg/m2  SpO2 95%  Wt Readings from Last 3 Encounters:  09/28/14 211 lb 13.8 oz (96.1 kg)    General: Vital signs reviewed and noted.   Head: Normocephalic, atraumatic.  Eyes: conjunctivae/corneas clear.  EOM's intact.   Throat: normal  Neck:  normal   Lungs:    clear  Heart:  Rr, 2/6 systolic murmur  Abdomen:  Soft, non-tender, non-distended  , mildly obese   Extremities: Right radial cath site looks good.   Neurologic: A&O X3, CN II - XII are grossly intact.   Psych: Normal     Labs: BMET:  Recent Labs  09/26/14 0645 09/27/14 0240  NA 141 140  K 4.2 3.7  CL 99 101  CO2 28 26  GLUCOSE 140* 109*  BUN 24* 26*  CREATININE 0.78 0.74  CALCIUM 9.4 9.1  MG 1.7  --     Liver function  tests: No results for input(s): AST, ALT, ALKPHOS, BILITOT, PROT, ALBUMIN in the last 72 hours. No results for input(s): LIPASE, AMYLASE in the last 72 hours.  CBC:  Recent Labs  09/27/14 0240  WBC 8.5  HGB 12.5  HCT 39.2  MCV 85.2  PLT 178    Cardiac Enzymes:  Recent Labs  09/26/14 0030 09/26/14 0645  TROPONINI 0.35* 0.19*    Coagulation Studies:  Recent Labs  09/27/14 0216  LABPROT 16.3*  INR 1.30    Other: Invalid input(s): POCBNP No results for input(s): DDIMER in the last 72 hours.  Recent Labs  09/26/14 0645  HGBA1C 6.7*    Recent Labs  09/26/14 0030  CHOL 219*  HDL 43  LDLCALC 130*  TRIG 231*  CHOLHDL 5.1    Recent Labs  09/26/14 0030  TSH 1.940   No results for input(s): VITAMINB12, FOLATE, FERRITIN, TIBC, IRON, RETICCTPCT in the last 72 hours.   Other results:  EKG :    NSR , no ST or T wave changes.   Medications:    Infusions: . sodium chloride Stopped (09/27/14 2100)  Scheduled Medications: . aspirin EC  81 mg Oral Daily  . losartan  50 mg Oral Daily   And  . hydrochlorothiazide  12.5 mg Oral Daily  . insulin aspart  0-15 Units Subcutaneous TID WC  . insulin aspart  0-5 Units Subcutaneous QHS  . insulin glargine  25 Units Subcutaneous Daily  . metoprolol  75 mg Oral BID  . omega-3 acid ethyl esters  3 g Oral BID  . sodium chloride  3 mL Intravenous Q12H   I have reviewed the cardiac cath and discussed with Dr. Excell Seltzer.    Assessment/ Plan:   1. Chest pressure:  Associated with a minimal Troponin elevation. Cath shows minor coronary irreg.  I suspect her chest pressure was due to overexertion.   Will increase her metoprolol. She needs to start a regular exercise program. She denies any shortness of breath at rest. No evidence of Pulmonary embolus.    2. Mild Troponin elevation:  Not c/w a NSTEMI.  I suspect she had demand ischemia.  Coronaries have minor luminal irreg.   3.  HTN : continue medical therapy.   Increase metoprolol   4. DM - further plans per primary md   5. Hyperlipidemia:    6. Murmur:  She has a soft systolic murmur - ? Outflow murmur.   LV function is vigorous - she may have a dynamic outflow obstruction. We have increased her metoprolol.  HR is well controlled.   Will have her follow up with her cardiologist in [facility] [place].    7. Obesity:  She is going to work on a diet and exercise program  Body mass index is 44.29 kg/(m^2).  Disposition: anticipate DC today.    Length of Stay: 3  Vesta Mixer, Montez Hageman., MD, [facility] 09/28/2014, 9:59 AM Office [phone] Pager [phone]

## 2014-09-28 NOTE — Progress Notes (Addendum)
CARDIAC REHAB PHASE I   PRE:  Rate/Rhythm: 59 SB with PACs    BP: sitting 166/60    SaO2:   MODE:  Ambulation: 600 ft   POST:  Rate/Rhythm: 90 SR with PACs    BP: sitting 198/81     SaO2: 98 RA  Pt tolerated well, sts she feels good. Does note some weakness from being in bed for a few days. BP elevated after walking. Sts she is a very active lady but does admit to needing more aerobic ex in her life. Gave walking gl to get started. Not interested in CRPII at this time. Pt is comfortable with her diet as she has been losing wt since last March.   [id number] Elissa Lovett [place] CES, ACSM 09/28/2014 10:40 AM

## 2014-09-28 NOTE — Discharge Summary (Signed)
Patient ID: Taylor Jenkins,  MRN: [id number], DOB/AGE: 73-19-1942 73 y.o.  Admit date: 09/25/2014 Discharge date: 09/28/2014  Primary Care Provider: No primary care provider on file. Primary Cardiologist: [facility] Eden Emms)  Discharge Diagnoses Principal Problem:   Demand ischemia Active Problems:   Acute diastolic CHF (congestive heart failure)   IDDM (insulin dependent diabetes mellitus)   HTN (hypertension)   HLD (hyperlipidemia)   Obesity BMI 44   CAD- mild, non obstructive by cath 09/27/14   Statin intolerance    Procedures:  Cath 09/27/14   Hospital Course:  74 year old obese (lost 50 lbs over the past year) Caucasian female with past medical history of hypertension, hyperlipidemia, diabetes mellitus type 2 on insulin who was transferred 09/25/14 from [facility] ED with chest pain, shortness of breath and mildly elevated troponin. Patient states she started having 10 out of 10 substernal heaviness and pressure as if elephant was seated in her chest. She also developed significant shortness of breath and wasn't able to catch her breath at all. Her neighbor called an ambulance and patient was transferred to the nearest emergency department. Patient reported that her symptoms lasted for at least 30 minutes until interventions in the emergency department. Patient's workup there was notable for pulmonary venous congestion on the chest x-ray, unremarkable EKG, mildly elevated troponin. She was started on a heparin drip, given Nitropaste and transferred to [facility]. It was felt she acute diastolic congestive heart failure and she was given IV lasix with a 2 L diuresis. Cath was done 09/27/14 and revealed minor,non obstructive CAD and hyperdynamic LVF. Plan is for medical Rx. Her beta blocker has been incrased. She was seen on the morning of the 26 th by Dr Elease Hashimoto and felt to be stable for discharge. The pt says she would prefer to f/u with her PCP. We will be glad to see her PRN. Home diuretics  were considered but Dr Elease Hashimoto felt she could be at risk for dynamic outflow obstruction if she became dehydrated.   Discharge Vitals:  Blood pressure 133/72, pulse 53, temperature 98 F (36.7 C), temperature source Oral, resp. rate 17, height  (1.473 m), weight 211 lb 13.8 oz (96.1 kg), SpO2 96 %.    Labs: Results for orders placed or performed during the hospital encounter of 09/25/14 (from the past 24 hour(s))  Glucose, capillary     Status: None   Collection Time: 09/27/14  4:34 PM  Result Value Ref Range   Glucose-Capillary 81 70 - 99 mg/dL   Comment 1 Capillary Sample   Glucose, capillary     Status: Abnormal   Collection Time: 09/27/14  9:17 PM  Result Value Ref Range   Glucose-Capillary 126 (H) 70 - 99 mg/dL   Comment 1 Capillary Sample   Glucose, capillary     Status: Abnormal   Collection Time: 09/28/14  7:29 AM  Result Value Ref Range   Glucose-Capillary 104 (H) 70 - 99 mg/dL   Comment 1 Capillary Sample   Glucose, capillary     Status: None   Collection Time: 09/28/14 12:07 PM  Result Value Ref Range   Glucose-Capillary 89 70 - 99 mg/dL   Comment 1 Capillary Sample     Disposition:      Follow-up Information    Please follow up.   Why:  call your doctor for follow up      Discharge Medications:    Medication List    STOP taking these medications  aspirin 325 MG tablet  Replaced by:  aspirin 81 MG EC tablet      TAKE these medications        acetaminophen 325 MG tablet  Commonly known as:  TYLENOL  Take 2 tablets (650 mg total) by mouth every 4 (four) hours as needed for headache or mild pain.     aspirin 81 MG EC tablet  Take 1 tablet (81 mg total) by mouth daily.     insulin glargine 100 UNIT/ML injection  Commonly known as:  LANTUS  Inject 25 Units into the skin daily.     losartan-hydrochlorothiazide 50-12.5 MG per tablet  Commonly known as:  HYZAAR  Take 1 tablet by mouth daily.     metFORMIN 500 MG tablet  Commonly known  as:  GLUCOPHAGE  Take 2 tablets (1,000 mg total) by mouth 2 (two) times daily with a meal.  Start taking on:  09/30/2014     metoprolol 50 MG tablet  Commonly known as:  LOPRESSOR  Take 1.5 tablets (75 mg total) by mouth 2 (two) times daily.     multivitamin tablet  Take 1 tablet by mouth daily.     nitroGLYCERIN 0.4 MG SL tablet  Commonly known as:  NITROSTAT  Place 1 tablet (0.4 mg total) under the tongue every 5 (five) minutes x 3 doses as needed for chest pain (or severe SOB).     omega-3 acid ethyl esters 1 G capsule  Commonly known as:  LOVAZA  Take 3 g by mouth 2 (two) times daily.         Duration of Discharge Encounter: Greater than 30 minutes including physician time.  Jolene Provost PA-C 09/28/2014 3:43 PM

## 2014-09-28 NOTE — Progress Notes (Signed)
Reveiwed AVS with patient using teachback method. Pt verbalized understanding and denied any questions at this time. Her daughter did have a question about a Lasix Rx that was discussed yesterday with Dr Melburn Popper. He was paged and asked about this Lasix Rx and stated that he chose not to place her on that medication in fear she would easily get dehydrated. The daughter was pleased with this answer and was eager to leave with her mother. Pt sent home with belongings.  Dawson Bills, RN

## 2015-06-03 LAB — HM DEXA SCAN

## 2015-08-02 ENCOUNTER — Ambulatory Visit (INDEPENDENT_AMBULATORY_CARE_PROVIDER_SITE_OTHER): Payer: Medicare Other | Admitting: Family Medicine

## 2015-08-02 ENCOUNTER — Encounter: Payer: Self-pay | Admitting: Family Medicine

## 2015-08-02 VITALS — BP 146/82 | HR 66 | Ht <= 58 in | Wt 216.0 lb

## 2015-08-02 DIAGNOSIS — M25561 Pain in right knee: Secondary | ICD-10-CM

## 2015-08-02 DIAGNOSIS — S8991XA Unspecified injury of right lower leg, initial encounter: Secondary | ICD-10-CM | POA: Diagnosis not present

## 2015-08-02 MED ORDER — METHYLPREDNISOLONE ACETATE 40 MG/ML IJ SUSP
40.0000 mg | Freq: Once | INTRAMUSCULAR | Status: AC
  Administered 2015-08-02: 40 mg via INTRA_ARTICULAR

## 2015-08-02 NOTE — Patient Instructions (Signed)
I'm concerned you tore the meniscus in your knee (likely medial). Icing 15 minutes at a time 3-4 times a day. Elevate above your heart as much as possible to help with swelling. TED hose for compression. You were given a cortisone injection today. If you're not improving after 1-2 weeks give me a call. Other considerations would be an MRI or physical therapy. I would recommend an MRI as the next step if the injection does not provide you with relief.

## 2015-08-03 DIAGNOSIS — S8991XA Unspecified injury of right lower leg, initial encounter: Secondary | ICD-10-CM | POA: Insufficient documentation

## 2015-08-03 NOTE — Progress Notes (Signed)
PCP: Danise Edge, MD  Subjective:   HPI: Patient is a 74 y.o. female here for right knee pain.  Patient reports having 3 months of anterior right knee pain. Started at that time when she got out of chair to get some water, turned and felt sharp pain posterior knee. Difficulty putting pressure on this leg immediately after this. Has been popping, clicking, catching, locking. Pain level 2/10, dull but up to 8/10 and sharp at times. + swelling into lower leg. Tried immobilizer, knee brace. No skin changes, fever, other complaints.  Past Medical History  Diagnosis Date  . Hypertension   . Diabetes mellitus without complication (HCC)     type 2  . Heart murmur     Current Outpatient Prescriptions on File Prior to Visit  Medication Sig Dispense Refill  . acetaminophen (TYLENOL) 325 MG tablet Take 2 tablets (650 mg total) by mouth every 4 (four) hours as needed for headache or mild pain.    Marland Kitchen aspirin EC 81 MG EC tablet Take 1 tablet (81 mg total) by mouth daily.    . insulin glargine (LANTUS) 100 UNIT/ML injection Inject 25 Units into the skin daily.    Marland Kitchen losartan-hydrochlorothiazide (HYZAAR) 50-12.5 MG per tablet Take 1 tablet by mouth daily.    . metFORMIN (GLUCOPHAGE) 500 MG tablet Take 2 tablets (1,000 mg total) by mouth 2 (two) times daily with a meal.    . metoprolol (LOPRESSOR) 50 MG tablet Take 1.5 tablets (75 mg total) by mouth 2 (two) times daily. 100 tablet 5  . Multiple Vitamin (MULTIVITAMIN) tablet Take 1 tablet by mouth daily.    . nitroGLYCERIN (NITROSTAT) 0.4 MG SL tablet Place 1 tablet (0.4 mg total) under the tongue every 5 (five) minutes x 3 doses as needed for chest pain (or severe SOB). 25 tablet 2  . omega-3 acid ethyl esters (LOVAZA) 1 G capsule Take 3 g by mouth 2 (two) times daily.     No current facility-administered medications on file prior to visit.    Past Surgical History  Procedure Laterality Date  . No past surgeries    . Left heart  catheterization with coronary angiogram N/A 09/27/2014    Procedure: LEFT HEART CATHETERIZATION WITH CORONARY ANGIOGRAM;  Surgeon: Micheline Chapman, MD;  Location[facility] CATH LAB;  Service: Cardiovascular;  Laterality: N/A;    Allergies  Allergen Reactions  . Penicillins Anaphylaxis  . Statins Other (See Comments)    Myalgias   . Codeine   . Prednisone Other (See Comments)    psychosis   . Lidocaine Palpitations  . Sulfa Antibiotics Rash    Social History   Social History  . Marital Status: Single    Spouse Name: N/A  . Number of Children: N/A  . Years of Education: N/A   Occupational History  . Not on file.   Social History Main Topics  . Smoking status: Never Smoker   . Smokeless tobacco: Not on file  . Alcohol Use: No  . Drug Use: No  . Sexual Activity: Yes    Birth Control/ Protection: Post-menopausal   Other Topics Concern  . Not on file   Social History Narrative    Family History  Problem Relation Age of Onset  . Heart attack Mother 15  . Heart attack Brother 47    BP 146/82 mmHg  Pulse 66  Ht  (1.473 m)  Wt 216 lb (97.977 kg)  BMI 45.16 kg/m2  Review of Systems: See HPI above.  Objective:  Physical Exam:  Gen: NAD  Right knee: Mild effusion.  No bruising. 1+ pitting edema compared to trace on left side.  No other deformity. TTP medial joint line.  No other tenderness. FROM. Negative ant/post drawers. Negative valgus/varus testing. Negative lachmanns. Positive mcmurrays, apleys, negative patellar apprehension. NV intact distally.  Left knee: FROM without pain.    Assessment & Plan:  1. Right knee injury - consistent with a medial meniscus tear.  Discussed options - will start with conservative measures though discussed concern about her mechanical symptoms.  Icing, elevation, compression.  Home exercises reviewed.  Injection given today.  If not improving would consider MRI.    After informed written consent, patient was lying  supine on exam table. Right knee was prepped with alcohol swab and utilizing superolateral approach with ultrasound guidance, patient's right knee was injected intraarticularly with 3:1 marcaine: depomedrol. Patient tolerated the procedure well without immediate complications.

## 2015-08-03 NOTE — Assessment & Plan Note (Signed)
consistent with a medial meniscus tear.  Discussed options - will start with conservative measures though discussed concern about her mechanical symptoms.  Icing, elevation, compression.  Home exercises reviewed.  Injection given today.  If not improving would consider MRI.    After informed written consent, patient was lying supine on exam table. Right knee was prepped with alcohol swab and utilizing superolateral approach with ultrasound guidance, patient's right knee was injected intraarticularly with 3:1 marcaine: depomedrol. Patient tolerated the procedure well without immediate complications.

## 2015-12-15 ENCOUNTER — Ambulatory Visit: Payer: Medicare Other | Admitting: Family Medicine

## 2016-01-27 ENCOUNTER — Ambulatory Visit: Payer: Medicare Other | Admitting: Family Medicine

## 2016-05-18 ENCOUNTER — Ambulatory Visit: Payer: Medicare Other | Admitting: Family Medicine

## 2016-09-07 ENCOUNTER — Telehealth: Payer: Self-pay

## 2016-09-07 NOTE — Telephone Encounter (Signed)
Left message fpor patient to call regarding pre-visit information.

## 2016-09-10 ENCOUNTER — Ambulatory Visit: Payer: Medicare Other | Admitting: Family Medicine

## 2017-04-24 ENCOUNTER — Emergency Department (HOSPITAL_BASED_OUTPATIENT_CLINIC_OR_DEPARTMENT_OTHER): Payer: Medicare Other

## 2017-04-24 ENCOUNTER — Inpatient Hospital Stay (HOSPITAL_BASED_OUTPATIENT_CLINIC_OR_DEPARTMENT_OTHER)
Admission: EM | Admit: 2017-04-24 | Discharge: 2017-04-29 | DRG: 377 | Disposition: A | Discharge status: Home / Self Care | Payer: Medicare Other | Attending: Internal Medicine | Admitting: Internal Medicine

## 2017-04-24 ENCOUNTER — Encounter (HOSPITAL_BASED_OUTPATIENT_CLINIC_OR_DEPARTMENT_OTHER): Payer: Self-pay

## 2017-04-24 DIAGNOSIS — I350 Nonrheumatic aortic (valve) stenosis: Secondary | ICD-10-CM | POA: Diagnosis present

## 2017-04-24 DIAGNOSIS — Z6838 Body mass index (BMI) 38.0-38.9, adult: Secondary | ICD-10-CM | POA: Diagnosis not present

## 2017-04-24 DIAGNOSIS — Z882 Allergy status to sulfonamides status: Secondary | ICD-10-CM

## 2017-04-24 DIAGNOSIS — Z79899 Other long term (current) drug therapy: Secondary | ICD-10-CM

## 2017-04-24 DIAGNOSIS — E785 Hyperlipidemia, unspecified: Secondary | ICD-10-CM | POA: Diagnosis present

## 2017-04-24 DIAGNOSIS — R0602 Shortness of breath: Secondary | ICD-10-CM

## 2017-04-24 DIAGNOSIS — Z7982 Long term (current) use of aspirin: Secondary | ICD-10-CM

## 2017-04-24 DIAGNOSIS — I248 Other forms of acute ischemic heart disease: Secondary | ICD-10-CM | POA: Diagnosis present

## 2017-04-24 DIAGNOSIS — K219 Gastro-esophageal reflux disease without esophagitis: Secondary | ICD-10-CM | POA: Diagnosis present

## 2017-04-24 DIAGNOSIS — I10 Essential (primary) hypertension: Secondary | ICD-10-CM | POA: Diagnosis not present

## 2017-04-24 DIAGNOSIS — I700 Atherosclerosis of aorta: Secondary | ICD-10-CM | POA: Diagnosis not present

## 2017-04-24 DIAGNOSIS — I5031 Acute diastolic (congestive) heart failure: Secondary | ICD-10-CM | POA: Diagnosis not present

## 2017-04-24 DIAGNOSIS — J810 Acute pulmonary edema: Secondary | ICD-10-CM | POA: Diagnosis not present

## 2017-04-24 DIAGNOSIS — E119 Type 2 diabetes mellitus without complications: Secondary | ICD-10-CM | POA: Diagnosis present

## 2017-04-24 DIAGNOSIS — I2510 Atherosclerotic heart disease of native coronary artery without angina pectoris: Secondary | ICD-10-CM | POA: Diagnosis present

## 2017-04-24 DIAGNOSIS — K5730 Diverticulosis of large intestine without perforation or abscess without bleeding: Secondary | ICD-10-CM | POA: Diagnosis present

## 2017-04-24 DIAGNOSIS — E669 Obesity, unspecified: Secondary | ICD-10-CM | POA: Diagnosis present

## 2017-04-24 DIAGNOSIS — E8771 Transfusion associated circulatory overload: Secondary | ICD-10-CM | POA: Diagnosis not present

## 2017-04-24 DIAGNOSIS — D62 Acute posthemorrhagic anemia: Secondary | ICD-10-CM | POA: Diagnosis present

## 2017-04-24 DIAGNOSIS — I4891 Unspecified atrial fibrillation: Secondary | ICD-10-CM

## 2017-04-24 DIAGNOSIS — K921 Melena: Secondary | ICD-10-CM | POA: Diagnosis present

## 2017-04-24 DIAGNOSIS — M109 Gout, unspecified: Secondary | ICD-10-CM | POA: Diagnosis present

## 2017-04-24 DIAGNOSIS — D649 Anemia, unspecified: Secondary | ICD-10-CM | POA: Diagnosis not present

## 2017-04-24 DIAGNOSIS — I110 Hypertensive heart disease with heart failure: Secondary | ICD-10-CM | POA: Diagnosis present

## 2017-04-24 DIAGNOSIS — Y848 Other medical procedures as the cause of abnormal reaction of the patient, or of later complication, without mention of misadventure at the time of the procedure: Secondary | ICD-10-CM | POA: Diagnosis not present

## 2017-04-24 DIAGNOSIS — D500 Iron deficiency anemia secondary to blood loss (chronic): Secondary | ICD-10-CM

## 2017-04-24 DIAGNOSIS — E872 Acidosis, unspecified: Secondary | ICD-10-CM

## 2017-04-24 DIAGNOSIS — Z515 Encounter for palliative care: Secondary | ICD-10-CM | POA: Diagnosis not present

## 2017-04-24 DIAGNOSIS — Z888 Allergy status to other drugs, medicaments and biological substances status: Secondary | ICD-10-CM

## 2017-04-24 DIAGNOSIS — D631 Anemia in chronic kidney disease: Secondary | ICD-10-CM | POA: Diagnosis present

## 2017-04-24 DIAGNOSIS — Z880 Allergy status to penicillin: Secondary | ICD-10-CM | POA: Diagnosis not present

## 2017-04-24 DIAGNOSIS — I361 Nonrheumatic tricuspid (valve) insufficiency: Secondary | ICD-10-CM | POA: Diagnosis not present

## 2017-04-24 DIAGNOSIS — I5033 Acute on chronic diastolic (congestive) heart failure: Secondary | ICD-10-CM | POA: Diagnosis present

## 2017-04-24 DIAGNOSIS — Z794 Long term (current) use of insulin: Secondary | ICD-10-CM | POA: Diagnosis not present

## 2017-04-24 DIAGNOSIS — Z886 Allergy status to analgesic agent status: Secondary | ICD-10-CM

## 2017-04-24 DIAGNOSIS — R195 Other fecal abnormalities: Secondary | ICD-10-CM | POA: Diagnosis not present

## 2017-04-24 DIAGNOSIS — I5032 Chronic diastolic (congestive) heart failure: Secondary | ICD-10-CM | POA: Diagnosis present

## 2017-04-24 HISTORY — DX: Atherosclerotic heart disease of native coronary artery without angina pectoris: I25.10

## 2017-04-24 HISTORY — DX: Nonrheumatic aortic (valve) stenosis: I35.0

## 2017-04-24 HISTORY — DX: Atherosclerosis of aorta: I70.0

## 2017-04-24 HISTORY — DX: Anemia, unspecified: D64.9

## 2017-04-24 LAB — CBC WITH DIFFERENTIAL/PLATELET
Basophils Absolute: 0 10*3/uL (ref 0.0–0.1)
Basophils Relative: 0 %
Eosinophils Absolute: 0.3 10*3/uL (ref 0.0–0.7)
Eosinophils Relative: 2 %
HCT: 23.2 % — ABNORMAL LOW (ref 36.0–46.0)
Hemoglobin: 6.6 g/dL — CL (ref 12.0–15.0)
Lymphocytes Relative: 15 %
Lymphs Abs: 1.9 10*3/uL (ref 0.7–4.0)
MCH: 28.2 pg (ref 26.0–34.0)
MCHC: 28.4 g/dL — ABNORMAL LOW (ref 30.0–36.0)
MCV: 99.1 fL (ref 78.0–100.0)
Monocytes Absolute: 1.1 10*3/uL — ABNORMAL HIGH (ref 0.1–1.0)
Monocytes Relative: 9 %
Neutro Abs: 9.2 10*3/uL — ABNORMAL HIGH (ref 1.7–7.7)
Neutrophils Relative %: 74 %
Platelets: 272 10*3/uL (ref 150–400)
RBC: 2.34 MIL/uL — ABNORMAL LOW (ref 3.87–5.11)
RDW: 17.9 % — ABNORMAL HIGH (ref 11.5–15.5)
WBC: 12.5 10*3/uL — ABNORMAL HIGH (ref 4.0–10.5)

## 2017-04-24 LAB — URINALYSIS, ROUTINE W REFLEX MICROSCOPIC
Bilirubin Urine: NEGATIVE
Glucose, UA: NEGATIVE mg/dL
Hgb urine dipstick: NEGATIVE
Ketones, ur: NEGATIVE mg/dL
Leukocytes, UA: NEGATIVE
Nitrite: NEGATIVE
Protein, ur: NEGATIVE mg/dL
Specific Gravity, Urine: 1.012 (ref 1.005–1.030)
pH: 5 (ref 5.0–8.0)

## 2017-04-24 LAB — COMPREHENSIVE METABOLIC PANEL
ALT: 41 U/L (ref 14–54)
AST: 47 U/L — ABNORMAL HIGH (ref 15–41)
Albumin: 3.4 g/dL — ABNORMAL LOW (ref 3.5–5.0)
Alkaline Phosphatase: 102 U/L (ref 38–126)
Anion gap: 13 (ref 5–15)
BUN: 38 mg/dL — ABNORMAL HIGH (ref 6–20)
CO2: 24 mmol/L (ref 22–32)
Calcium: 8.5 mg/dL — ABNORMAL LOW (ref 8.9–10.3)
Chloride: 99 mmol/L — ABNORMAL LOW (ref 101–111)
Creatinine, Ser: 0.83 mg/dL (ref 0.44–1.00)
GFR calc Af Amer: >60 mL/min (ref >60)
GFR calc non Af Amer: >60 mL/min (ref >60)
Glucose, Bld: 108 mg/dL — ABNORMAL HIGH (ref 65–99)
Potassium: 4.1 mmol/L (ref 3.5–5.1)
Sodium: 136 mmol/L (ref 135–145)
Total Bilirubin: 0.3 mg/dL (ref 0.3–1.2)
Total Protein: 6.8 g/dL (ref 6.5–8.1)

## 2017-04-24 LAB — T4, FREE: Free T4: 1.09 ng/dL (ref 0.61–1.12)

## 2017-04-24 LAB — TSH: TSH: 1.413 u[IU]/mL (ref 0.350–4.500)

## 2017-04-24 LAB — I-STAT CG4 LACTIC ACID, ED
Lactic Acid, Venous: 2.97 mmol/L (ref 0.5–1.9)
Lactic Acid, Venous: 1.9 mmol/L (ref 0.5–1.9)

## 2017-04-24 LAB — TROPONIN I: Troponin I: 0.08 ng/mL (ref <0.03)

## 2017-04-24 LAB — BRAIN NATRIURETIC PEPTIDE: B Natriuretic Peptide: 562.9 pg/mL — ABNORMAL HIGH (ref 0.0–100.0)

## 2017-04-24 LAB — OCCULT BLOOD X 1 CARD TO LAB, STOOL: Fecal Occult Bld: POSITIVE — AB

## 2017-04-24 MED ORDER — METOPROLOL SUCCINATE ER 50 MG PO TB24
50.0000 mg | ORAL_TABLET | Freq: Every day | ORAL | Status: DC
  Administered 2017-04-25: 50 mg via ORAL
  Filled 2017-04-24 (×2): qty 1

## 2017-04-24 MED ORDER — ACETAMINOPHEN 325 MG PO TABS: 650.0000 mg | ORAL_TABLET | ORAL | Status: DC | PRN

## 2017-04-24 MED ORDER — ONDANSETRON HCL 4 MG PO TABS: 4.0000 mg | ORAL_TABLET | Freq: Four times a day (QID) | ORAL | Status: DC | PRN

## 2017-04-24 MED ORDER — ONDANSETRON HCL 4 MG/2ML IJ SOLN: 4.0000 mg | Freq: Four times a day (QID) | INTRAMUSCULAR | Status: DC | PRN

## 2017-04-24 MED ORDER — ADULT MULTIVITAMIN W/MINERALS CH
1.0000 | ORAL_TABLET | Freq: Every day | ORAL | Status: DC
  Administered 2017-04-25 – 2017-04-29 (×5): 1 via ORAL
  Filled 2017-04-24 (×5): qty 1

## 2017-04-24 MED ORDER — INSULIN GLARGINE 100 UNIT/ML ~~LOC~~ SOLN: 10.0000 [IU] | Freq: Every day | SUBCUTANEOUS | Status: DC

## 2017-04-24 MED ORDER — INSULIN ASPART 100 UNIT/ML ~~LOC~~ SOLN
0.0000 [IU] | SUBCUTANEOUS | Status: DC
  Administered 2017-04-25 (×2): 1 [IU] via SUBCUTANEOUS
  Administered 2017-04-26: 3 [IU] via SUBCUTANEOUS
  Administered 2017-04-26: 1 [IU] via SUBCUTANEOUS
  Administered 2017-04-26: 2 [IU] via SUBCUTANEOUS
  Administered 2017-04-27: 3 [IU] via SUBCUTANEOUS
  Administered 2017-04-27 – 2017-04-28 (×2): 1 [IU] via SUBCUTANEOUS
  Administered 2017-04-28: 2 [IU] via SUBCUTANEOUS
  Administered 2017-04-28 – 2017-04-29 (×2): 1 [IU] via SUBCUTANEOUS
  Filled 2017-04-24 (×2): qty 1

## 2017-04-24 MED ORDER — SODIUM CHLORIDE 0.9 % IV SOLN
10.0000 mL/h | Freq: Once | INTRAVENOUS | Status: AC
  Administered 2017-04-25: 10 mL/h via INTRAVENOUS

## 2017-04-24 MED ORDER — LEVALBUTEROL HCL 0.63 MG/3ML IN NEBU: 0.6300 mg | INHALATION_SOLUTION | Freq: Once | RESPIRATORY_TRACT | Status: DC

## 2017-04-24 MED ORDER — VANCOMYCIN HCL IN DEXTROSE 1-5 GM/200ML-% IV SOLN
1000.0000 mg | Freq: Once | INTRAVENOUS | Status: AC
  Administered 2017-04-24: 1000 mg via INTRAVENOUS
  Filled 2017-04-24: qty 200

## 2017-04-24 MED ORDER — PANTOPRAZOLE SODIUM 40 MG IV SOLR: 40.0000 mg | Freq: Two times a day (BID) | INTRAVENOUS | Status: DC

## 2017-04-24 MED ORDER — SODIUM CHLORIDE 0.9 % IV SOLN
80.0000 mg | Freq: Once | INTRAVENOUS | Status: AC
  Administered 2017-04-25: 80 mg via INTRAVENOUS
  Filled 2017-04-24: qty 80

## 2017-04-24 MED ORDER — LEVALBUTEROL HCL 0.63 MG/3ML IN NEBU
0.6300 mg | INHALATION_SOLUTION | Freq: Once | RESPIRATORY_TRACT | Status: AC
  Administered 2017-04-24: 0.63 mg via RESPIRATORY_TRACT
  Filled 2017-04-24: qty 3

## 2017-04-24 MED ORDER — LEVOFLOXACIN IN D5W 500 MG/100ML IV SOLN
500.0000 mg | Freq: Once | INTRAVENOUS | Status: AC
  Administered 2017-04-24: 500 mg via INTRAVENOUS
  Filled 2017-04-24: qty 100

## 2017-04-24 MED ORDER — SODIUM CHLORIDE 0.9 % IV SOLN
8.0000 mg/h | INTRAVENOUS | Status: DC
  Administered 2017-04-25: 8 mg/h via INTRAVENOUS
  Filled 2017-04-24 (×2): qty 80

## 2017-04-24 MED ORDER — METOPROLOL TARTRATE 25 MG PO TABS: 75.0000 mg | ORAL_TABLET | Freq: Two times a day (BID) | ORAL | Status: DC

## 2017-04-24 MED ORDER — INSULIN GLARGINE 100 UNIT/ML ~~LOC~~ SOLN
10.0000 [IU] | Freq: Every day | SUBCUTANEOUS | Status: DC
  Filled 2017-04-24 (×2): qty 0.1

## 2017-04-24 NOTE — Progress Notes (Signed)
Nurse to page cardiology service once patient arrive at [facility].   Currently waiting for bed. EKG shows afib with HR 106. Positive FOB. Anemic with hemoglobin 6.6. Trop borderline elevated, maybe related to demand ischemia, but will assess based on trend. Elevated lactic acid. Admitting to hospitalist service, cardiology to consult.  Ramond Dial PA Pager: [phone]

## 2017-04-24 NOTE — Progress Notes (Addendum)
Relda, Gleason Female, 76 y.o., 1941-02-17  Transfer from [facility] ED due to dyspnea, new onset of afib (heart rate in the low 100's, bp low normal),  acute on chronic heart failure with bilateral lower extremity pitting edema,  cxr with cardiomegaly and pulmonary edema, she is also found to have acute anemia ( hgb 6.6) with + FOBT (she has been on iron supplement, but reports stool have been getting darker recently).  She denies chest pain, no acute st/t changes, she is on 2liter oxygen.  She does has leukocytosis and lactic acidosis, she got iv vanc/levaquin in the ED. she did not get lasix due to concerns of borderline bp per EDP . she did not get blood transfusion, not available at [facility] ED per EDP.  EDP consulted cardiology Dr Excell Seltzer who states will consult on patient.  She will need prbc transfusion with lasix and  gi consult once she gets  to [place].  Patient is accepted to stepdown.   Please call flow manager once patient arrives to [facility].

## 2017-04-24 NOTE — ED Triage Notes (Addendum)
Was advised to come to ED for low HGB-presents to triage in w/c-pale/tachypneic-denies pain-pt was seen Saturday by PCP office and started on levaquin for probable PNE-no CXR was done

## 2017-04-24 NOTE — H&P (Addendum)
History and Physical    Taylor Jenkins PVV:[id number] DOB: Mar 17, 1941 DOA: 04/24/2017  PCP: Bradd Canary, MD  Patient coming from: Home  I have personally briefly reviewed patient's old medical records in [facility]  Chief Complaint: Fatigue  HPI: Taylor Jenkins is a 76 y.o. female with medical history significant of DM2, HTN, moderate aortic stenosis.  Patient presents to the ED at [facility] with c/o increasing fatigue over last couple of weeks.  DOE, dizziness with exertion.   ED Course: HGB 6.6 in ED.  Patient notes that stool has been soft and black intermittently for the past 2+ weeks.  She had thought it was the iron supplement she was taking.  Hemoccult is positive.   Review of Systems: As per HPI otherwise 10 point review of systems negative.   Past Medical History:  Diagnosis Date  . Anemia   . Diabetes mellitus without complication (HCC)    type 2  . Heart murmur   . Hypertension     Past Surgical History:  Procedure Laterality Date  . LEFT HEART CATHETERIZATION WITH CORONARY ANGIOGRAM N/A 09/27/2014   Procedure: LEFT HEART CATHETERIZATION WITH CORONARY ANGIOGRAM;  Surgeon: Micheline Chapman, MD;  Location: [facility] CATH LAB;  Service: Cardiovascular;  Laterality: N/A;  . NO PAST SURGERIES       reports that she has never smoked. She has never used smokeless tobacco. She reports that she does not drink alcohol or use drugs.  Allergies  Allergen Reactions  . Penicillins Anaphylaxis  . Statins Other (See Comments)    Myalgias   . Codeine   . Prednisone Other (See Comments)    psychosis   . Lidocaine Palpitations  . Sulfa Antibiotics Rash    Family History  Problem Relation Age of Onset  . Heart attack Mother 71  . Heart attack Brother 47     Prior to Admission medications   Medication Sig Start Date End Date Taking? Authorizing Provider  acetaminophen (TYLENOL) 325 MG tablet Take 2 tablets (650 mg total) by mouth every 4 (four) hours as needed for  headache or mild pain. 09/28/14  Yes Kilroy, Luke K, PA-C  allopurinol (ZYLOPRIM) 100 MG tablet Take 100 mg by mouth daily. 01/29/17  Yes [provider]  aspirin EC 81 MG EC tablet Take 1 tablet (81 mg total) by mouth daily. 09/28/14  Yes Kilroy, Luke K, PA-C  insulin NPH Human (HUMULIN N,NOVOLIN N) 100 UNIT/ML injection Inject 18-25 Units into the skin See admin instructions. Use 25 units every morning and use 18 units at night   Yes [provider]  levofloxacin (LEVAQUIN) 750 MG tablet Take 750 mg by mouth daily. 04/20/17  Yes [provider]  metFORMIN (GLUCOPHAGE) 1000 MG tablet Take 1,000 mg by mouth 2 (two) times daily with a meal.   Yes [provider]  metoprolol succinate (TOPROL-XL) 50 MG 24 hr tablet Take 50 mg by mouth daily. 01/29/17  Yes [provider]  Multiple Vitamin (MULTIVITAMIN) tablet Take 1 tablet by mouth daily.   Yes [provider]  nitroGLYCERIN (NITROSTAT) 0.4 MG SL tablet Place 1 tablet (0.4 mg total) under the tongue every 5 (five) minutes x 3 doses as needed for chest pain (or severe SOB). 09/28/14  Yes Kilroy, Luke K, PA-C  omega-3 acid ethyl esters (LOVAZA) 1 G capsule Take 1-2 g by mouth See admin instructions. Take 2 capsules every morning and take 1 capsule every evening   Yes [provider]  potassium chloride (MICRO-K) 10 MEQ CR capsule Take 10 mEq by mouth daily.  08/01/15  Yes [provider]  torsemide (DEMADEX) 10 MG tablet Take 5 mg by mouth daily.  08/01/15  Yes [provider]    Physical Exam: Vitals:   04/24/17 2100 04/24/17 2130 04/24/17 2200 04/24/17 2317  BP: 102/64 (!) 107/49 97/79 133/77  Pulse:   63 88  Resp: (!) 25 (!) 26 (!) 21 (!) 30  Temp:    98.2 F (36.8 C)  TempSrc:    Oral  SpO2:   100% 100%  Weight:      Height:        Constitutional: NAD, calm, comfortable Eyes: PERRL, lids and conjunctivae normal ENMT: Mucous membranes are moist. Posterior pharynx  clear of any exudate or lesions.Normal dentition.  Neck: normal, supple, no masses, no thyromegaly Respiratory: clear to auscultation bilaterally, no wheezing, no crackles. Normal respiratory effort. No accessory muscle use.  Cardiovascular: Irr, irr with murmur Abdomen: no tenderness, no masses palpated. No hepatosplenomegaly. Bowel sounds positive.  Musculoskeletal: no clubbing / cyanosis. No joint deformity upper and lower extremities. Good ROM, no contractures. Normal muscle tone.  Skin: no rashes, lesions, ulcers. No induration Neurologic: CN 2-12 grossly intact. Sensation intact, DTR normal. Strength 5/5 in all 4.  Psychiatric: Normal judgment and insight. Alert and oriented x 3. Normal mood.    Labs on Admission: I have personally reviewed following labs and imaging studies  CBC:  Recent Labs Lab 04/24/17 1538  WBC 12.5*  NEUTROABS 9.2*  HGB 6.6*  HCT 23.2*  MCV 99.1  PLT 272   Basic Metabolic Panel:  Recent Labs Lab 04/24/17 1538  NA 136  K 4.1  CL 99*  CO2 24  GLUCOSE 108*  BUN 38*  CREATININE 0.83  CALCIUM 8.5*   GFR: Estimated Creatinine Clearance: 57.7 mL/min (by C-G formula based on SCr of 0.83 mg/dL). Liver Function Tests:  Recent Labs Lab 04/24/17 1538  AST 47*  ALT 41  ALKPHOS 102  BILITOT 0.3  PROT 6.8  ALBUMIN 3.4*   No results for input(s): LIPASE, AMYLASE in the last 168 hours. No results for input(s): AMMONIA in the last 168 hours. Coagulation Profile: No results for input(s): INR, PROTIME in the last 168 hours. Cardiac Enzymes:  Recent Labs Lab 04/24/17 1538  TROPONINI 0.08*   BNP (last 3 results) No results for input(s): PROBNP in the last 8760 hours. HbA1C: No results for input(s): HGBA1C in the last 72 hours. CBG: No results for input(s): GLUCAP in the last 168 hours. Lipid Profile: No results for input(s): CHOL, HDL, LDLCALC, TRIG, CHOLHDL, LDLDIRECT in the last 72 hours. Thyroid Function Tests:  Recent Labs   04/24/17 1538  TSH 1.413  FREET4 1.09   Anemia Panel: No results for input(s): VITAMINB12, FOLATE, FERRITIN, TIBC, IRON, RETICCTPCT in the last 72 hours. Urine analysis:    Component Value Date/Time   COLORURINE YELLOW 04/24/2017 1608   APPEARANCEUR CLEAR 04/24/2017 1608   LABSPEC 1.012 04/24/2017 1608   PHURINE 5.0 04/24/2017 1608   GLUCOSEU NEGATIVE 04/24/2017 1608   HGBUR NEGATIVE 04/24/2017 1608   BILIRUBINUR NEGATIVE 04/24/2017 1608   KETONESUR NEGATIVE 04/24/2017 1608   PROTEINUR NEGATIVE 04/24/2017 1608   NITRITE NEGATIVE 04/24/2017 1608   LEUKOCYTESUR NEGATIVE 04/24/2017 1608    Radiological Exams on Admission: Dg Chest Port 1 View  Result Date: 04/24/2017 CLINICAL DATA:  Low hemoglobin and shortness of breath. EXAM: PORTABLE CHEST 1 VIEW COMPARISON:  None. FINDINGS:  Interstitial coarsening that could be congestive or less likely bronchitic. No Kerley lines. Cardiomegaly and mild aortic tortuosity. No effusion or pneumothorax. Presumed hyperinflation with flat diaphragm. IMPRESSION: 1. Interstitial coarsening, favored congestive rather than bronchitic. 2. Cardiomegaly. Electronically Signed   By: Marnee Spring M.D.   On: 04/24/2017 16:25    EKG: Independently reviewed.  Assessment/Plan Principal Problem:   Acute blood loss anemia Active Problems:   Demand ischemia (HCC)   Acute diastolic CHF (congestive heart failure) (HCC)   IDDM (insulin dependent diabetes mellitus) (HCC)   HTN (hypertension)   Melena   New onset a-fib (HCC)    1. Acute blood loss anemia - 1. 2 unit PRBC transfusion 2. Repeat CBC in AM 3. PPI bolus and gtt for likely slow bleeding UGI source (maybe ulcer?) 4. Call GI in AM 5. Tele monitor 2. Demand ischemia and acute diastolic CHF - Secondary to above 1. Continue torsemide daily 2. May require additional diuresis in setting of transfusion 3. Transfuse slowly as patient has moderate AS as well 4. Serial trops ordered 3. HTN  - 1. Continue metoprolol daily 4. DM2 - 1. Hold home NPH and metformin 2. Lantus 10 daily 3. Sensitive SSI q4h 5. New onset A.Fib - 1. Continue metoprolol daily that she is conveniently enough already on and appears to be rate controlling her to just about 100 2. Obviously no blood thinners right now in setting of GI bleed  DVT prophylaxis: SCDs Code Status: Full Family Communication: Family at bedside Disposition Plan: Home after admit Consults called: None Admission status: Admit to inpatient - inpatient for GIB with significant symptomatic anemia   Takyia Sindt M. DO [facility] Pager [phone]  If 7AM-7PM, please contact day team taking care of patient [email or web address] Password [facility]  04/24/2017, 11:56 PM

## 2017-04-24 NOTE — ED Provider Notes (Signed)
11:22 PM Pt in ED the transfer from at [facility] with diagnosis of anemia. Patient is awaiting stepdown bed, not available at this time. Patient states that she has been feeling increasingly fatigued over the last several weeks. She states that she is usually very active, however the last few days she hasn't been able to do anything without getting short of breath and dizzy. Patient's hemoglobin was found to be 6.6.her fecal occult blood was positive. She was [facility]   Patient states that she is feeling nervous but overall has no complaints. Her vital signs are normal. She is maintaining normal blood pressure and heart rate. She does appear to be pale on exam. Regular heart rate and rhythm. No murmurs. Lungs are clear bilaterally. I will order blood transfusion. Patient will be in ED until she gets a stepdown bed.   Vitals:   04/24/17 2100 04/24/17 2130 04/24/17 2200 04/24/17 2317  BP: 102/64 (!) 107/49 97/79 133/77  Pulse:   63 88  Resp: (!) 25 (!) 26 (!) 21 (!) 30  Temp:    98.2 F (36.8 C)  TempSrc:    Oral  SpO2:   100% 100%  Weight:      Height:          Jaynie Crumble, PA-C 04/24/17 2329    Gilda Crease, MD 04/24/17 [phone]

## 2017-04-24 NOTE — ED Provider Notes (Signed)
[facility] Provider Note   CSN: [id number] Arrival date & time: 04/24/17  1514     History   Chief Complaint Chief Complaint  Patient presents with  . Abnormal Lab    HPI Taylor Jenkins is a 76 y.o. female.  HPI Patient is referred from her primary physician's office for anemia. Hemoglobin was 6 in the office. Patient was recently seen for dyspnea, nonproductive cough, fatigue and diaphoresis. Started on antibiotic. Patient states her symptoms have been persistent. Denies any chest pain. Has had lower extremity swelling but this has improved somewhat. Patient has noticed change in stool is now dark and melanotic appearing. She is on persistent iron therapy with no recent changes in dose. No history of atrial fibrillation. Past Medical History:  Diagnosis Date  . Anemia   . Diabetes mellitus without complication (HCC)    type 2  . Heart murmur   . Hypertension     Patient Active Problem List   Diagnosis Date Noted  . Anemia 04/24/2017  . Right knee injury 08/03/2015  . Obesity BMI 44 09/28/2014  . CAD- mild, non obstructive by cath 09/27/14 09/28/2014  . Statin intolerance 09/28/2014  . Elevated troponin   . Demand ischemia (HCC) 09/25/2014  . Acute diastolic CHF (congestive heart failure) (HCC) 09/25/2014  . Acute cardiogenic pulmonary edema (HCC) 09/25/2014  . IDDM (insulin dependent diabetes mellitus) (HCC) 09/25/2014  . HTN (hypertension) 09/25/2014  . HLD (hyperlipidemia) 09/25/2014    Past Surgical History:  Procedure Laterality Date  . LEFT HEART CATHETERIZATION WITH CORONARY ANGIOGRAM N/A 09/27/2014   Procedure: LEFT HEART CATHETERIZATION WITH CORONARY ANGIOGRAM;  Surgeon: Micheline Chapman, MD;  Location: [facility] CATH LAB;  Service: Cardiovascular;  Laterality: N/A;  . NO PAST SURGERIES      OB History    No data available       Home Medications    Prior to Admission medications   Medication Sig Start Date End Date Taking? Authorizing Provider    acetaminophen (TYLENOL) 325 MG tablet Take 2 tablets (650 mg total) by mouth every 4 (four) hours as needed for headache or mild pain. 09/28/14   Abelino Derrick, PA-C  aspirin EC 81 MG EC tablet Take 1 tablet (81 mg total) by mouth daily. 09/28/14   Abelino Derrick, PA-C  insulin glargine (LANTUS) 100 UNIT/ML injection Inject 25 Units into the skin daily.    [provider]  losartan-hydrochlorothiazide (HYZAAR) 50-12.5 MG per tablet Take 1 tablet by mouth daily.    [provider]  metFORMIN (GLUCOPHAGE) 500 MG tablet Take 2 tablets (1,000 mg total) by mouth 2 (two) times daily with a meal. 09/30/14   Kilroy, Eda Paschal, PA-C  metoprolol (LOPRESSOR) 50 MG tablet Take 1.5 tablets (75 mg total) by mouth 2 (two) times daily. 09/28/14   Abelino Derrick, PA-C  Multiple Vitamin (MULTIVITAMIN) tablet Take 1 tablet by mouth daily.    [provider]  nitroGLYCERIN (NITROSTAT) 0.4 MG SL tablet Place 1 tablet (0.4 mg total) under the tongue every 5 (five) minutes x 3 doses as needed for chest pain (or severe SOB). 09/28/14   Abelino Derrick, PA-C  omega-3 acid ethyl esters (LOVAZA) 1 G capsule Take 3 g by mouth 2 (two) times daily.    [provider]  potassium chloride (MICRO-K) 10 MEQ CR capsule  08/01/15   [provider]  torsemide (DEMADEX) 10 MG tablet  08/01/15   [provider]    Family  History Family History  Problem Relation Age of Onset  . Heart attack Mother 27  . Heart attack Brother 56    Social History Social History  Substance Use Topics  . Smoking status: Never Smoker  . Smokeless tobacco: Never Used  . Alcohol use No     Allergies   Penicillins; Statins; Codeine; Prednisone; Lidocaine; and Sulfa antibiotics   Review of Systems Review of Systems  Constitutional: Positive for activity change, diaphoresis and fatigue. Negative for fever.  HENT: Negative for facial swelling and trouble swallowing.   Respiratory: Positive for  cough, shortness of breath and wheezing.   Cardiovascular: Positive for leg swelling. Negative for chest pain and palpitations.  Gastrointestinal: Negative for abdominal pain, constipation, diarrhea, nausea and vomiting.  Genitourinary: Negative for dysuria, flank pain and frequency.  Musculoskeletal: Negative for back pain, myalgias, neck pain and neck stiffness.  Skin: Positive for pallor. Negative for rash and wound.  Neurological: Positive for weakness (generalized), light-headedness and headaches. Negative for syncope and numbness.  All other systems reviewed and are negative.    Physical Exam Updated Vital Signs BP (!) 101/58   Pulse 87   Temp 98.9 F (37.2 C) (Oral)   Resp (!) 22   Ht 4\' 11"  (1.499 m)   Wt 88.9 kg (196 lb)   SpO2 99%   BMI 39.59 kg/m   Physical Exam  Constitutional: She is oriented to person, place, and time. She appears well-developed and well-nourished. No distress.  HENT:  Head: Normocephalic and atraumatic.  Mouth/Throat: Oropharynx is clear and moist. No oropharyngeal exudate.  Eyes: Pupils are equal, round, and reactive to light. EOM are normal.  Neck: Normal range of motion. Neck supple.  Cardiovascular:  Tachycardia. Irregularly irregular.  Pulmonary/Chest: Effort normal. She has wheezes.  Expiratory wheezing throughout with crackles in bilateral bases. Mild increase work of breathing.  Abdominal: Soft. Bowel sounds are normal. There is no tenderness. There is no rebound and no guarding.  Musculoskeletal: Normal range of motion. She exhibits edema. She exhibits no tenderness.  1+ bilateral lower extremity edema. Distal pulses intact. No calf tenderness or asymmetry.  Lymphadenopathy:    She has no cervical adenopathy.  Neurological: She is alert and oriented to person, place, and time.  Moves all extremities without focal deficit. Sensation intact.  Skin: Skin is warm. No rash noted. She is diaphoretic. No erythema. There is pallor.   Psychiatric: She has a normal mood and affect. Her behavior is normal.  Nursing note and vitals reviewed.    ED Treatments / Results  Labs (all labs ordered are listed, but only abnormal results are displayed) Labs Reviewed  CBC WITH DIFFERENTIAL/PLATELET - Abnormal; Notable for the following:       Result Value   WBC 12.5 (*)    RBC 2.34 (*)    Hemoglobin 6.6 (*)    HCT 23.2 (*)    MCHC 28.4 (*)    RDW 17.9 (*)    Neutro Abs 9.2 (*)    Monocytes Absolute 1.1 (*)    All other components within normal limits  COMPREHENSIVE METABOLIC PANEL - Abnormal; Notable for the following:    Chloride 99 (*)    Glucose, Bld 108 (*)    BUN 38 (*)    Calcium 8.5 (*)    Albumin 3.4 (*)    AST 47 (*)    All other components within normal limits  BRAIN NATRIURETIC PEPTIDE - Abnormal; Notable for the following:    B Natriuretic  Peptide 562.9 (*)    All other components within normal limits  TROPONIN I - Abnormal; Notable for the following:    Troponin I 0.08 (*)    All other components within normal limits  OCCULT BLOOD X 1 CARD TO LAB, STOOL - Abnormal; Notable for the following:    Fecal Occult Bld POSITIVE (*)    All other components within normal limits  I-STAT CG4 LACTIC ACID, ED - Abnormal; Notable for the following:    Lactic Acid, Venous 2.97 (*)    All other components within normal limits  CULTURE, BLOOD (ROUTINE X 2)  CULTURE, BLOOD (ROUTINE X 2)  TSH  T4, FREE  URINALYSIS, ROUTINE W REFLEX MICROSCOPIC  I-STAT CG4 LACTIC ACID, ED    EKG  EKG Interpretation  Date/Time:  Wednesday April 24 2017 16:01:34 EDT Ventricular Rate:  106 PR Interval:    QRS Duration: 116 QT Interval:  362 QTC Calculation: 481 R Axis:   75 Text Interpretation:  Atrial fibrillation Ventricular premature complex Nonspecific intraventricular conduction delay Low voltage, precordial leads Borderline ST depression, anterolateral leads Baseline wander in lead(s) II aVF Confirmed by Ranae Palms  MD,  Stephanieann Popescu ([id number]) on 04/24/2017 5:31:44 PM       Radiology Dg Chest Port 1 View  Result Date: 04/24/2017 CLINICAL DATA:  Low hemoglobin and shortness of breath. EXAM: PORTABLE CHEST 1 VIEW COMPARISON:  None. FINDINGS: Interstitial coarsening that could be congestive or less likely bronchitic. No Kerley lines. Cardiomegaly and mild aortic tortuosity. No effusion or pneumothorax. Presumed hyperinflation with flat diaphragm. IMPRESSION: 1. Interstitial coarsening, favored congestive rather than bronchitic. 2. Cardiomegaly. Electronically Signed   By: Marnee Spring M.D.   On: 04/24/2017 16:25    Procedures Procedures (including critical care time)  Medications Ordered in ED Medications  levalbuterol (XOPENEX) nebulizer solution 0.63 mg (0.63 mg Nebulization Given 04/24/17 1612)  vancomycin (VANCOCIN) IVPB 1000 mg/200 mL premix (0 mg Intravenous Stopped 04/24/17 1828)  levofloxacin (LEVAQUIN) IVPB 500 mg (0 mg Intravenous Stopped 04/24/17 1944)    CRITICAL CARE Performed by: Ranae Palms, Hernan Turnage Total critical care time: 65 minutes Critical care time was exclusive of separately billable procedures and treating other patients. Critical care was necessary to treat or prevent imminent or life-threatening deterioration. Critical care was time spent personally by me on the following activities: development of treatment plan with patient and/or surrogate as well as nursing, discussions with consultants, evaluation of patient's response to treatment, examination of patient, obtaining history from patient or surrogate, ordering and performing treatments and interventions, ordering and review of laboratory studies, ordering and review of radiographic studies, pulse oximetry and re-evaluation of patient's condition. Initial Impression / Assessment and Plan / ED Course  I have reviewed the triage vital signs and the nursing notes.  Pertinent labs & imaging results that were available during my care of the  patient were reviewed by me and considered in my medical decision making (see chart for details).    Patient with anemia, CHF exacerbation, new onset atrial fibrillation with elevation and lactic acid and white blood cell count. She has had a recent change in her stool color and has a positive Hemoccult though she is taking iron. Believe that the loss is most likely cause for her anemia. Her other symptoms may be also caused by her anemia including new onset atrial fibrillation which is exacerbating her congestive heart failure. Though I believe sepsis to be less likely, blood cultures have been sent. We'll cover with broad-spectrum antibiotics but will  hold IV fluids given concern for fluid overload status. Patient will need blood products and likely gentle diuresis. She's been given a Xopenex treatment in the emergency department states that her shortness of breath has improved. She still does have expiratory wheezing but appears more comfortable. Have discussed with Dr. Excell Seltzer who seen the patient in the past. Cardiology will consult on the patient believes that priority should be given to transfusion.  Discussed with hospitalist and Dr Roda Shutters excepted to step down bed. Delay and transferring the patient due to multiple patients being held in the emergency department for step down beds. Given the fact that patient needs to start blood transfusion and no blood products are available in the emergency department at the [facility], discussed with Dr. Clarice Pole who will accept the patient to the emergency department so that she can and begin transfusion. Final Clinical Impressions(s) / ED Diagnoses   Final diagnoses:  Symptomatic anemia  New onset atrial fibrillation (HCC)  Acute pulmonary edema (HCC)  Lactic acidosis    New Prescriptions New Prescriptions   No medications on file     Loren Racer, MD 04/24/17 2150

## 2017-04-25 ENCOUNTER — Inpatient Hospital Stay (HOSPITAL_COMMUNITY): Payer: Medicare Other

## 2017-04-25 ENCOUNTER — Encounter (HOSPITAL_COMMUNITY): Payer: Self-pay | Admitting: Physician Assistant

## 2017-04-25 DIAGNOSIS — I4891 Unspecified atrial fibrillation: Secondary | ICD-10-CM | POA: Diagnosis present

## 2017-04-25 DIAGNOSIS — K921 Melena: Principal | ICD-10-CM

## 2017-04-25 DIAGNOSIS — I248 Other forms of acute ischemic heart disease: Secondary | ICD-10-CM

## 2017-04-25 DIAGNOSIS — I10 Essential (primary) hypertension: Secondary | ICD-10-CM

## 2017-04-25 DIAGNOSIS — D631 Anemia in chronic kidney disease: Secondary | ICD-10-CM | POA: Diagnosis present

## 2017-04-25 DIAGNOSIS — I5031 Acute diastolic (congestive) heart failure: Secondary | ICD-10-CM

## 2017-04-25 DIAGNOSIS — D649 Anemia, unspecified: Secondary | ICD-10-CM | POA: Diagnosis present

## 2017-04-25 DIAGNOSIS — R195 Other fecal abnormalities: Secondary | ICD-10-CM

## 2017-04-25 LAB — CBC
HCT: 24.5 % — ABNORMAL LOW (ref 36.0–46.0)
Hemoglobin: 7.1 g/dL — ABNORMAL LOW (ref 12.0–15.0)
MCH: 28.3 pg (ref 26.0–34.0)
MCHC: 29 g/dL — ABNORMAL LOW (ref 30.0–36.0)
MCV: 97.6 fL (ref 78.0–100.0)
Platelets: 246 10*3/uL (ref 150–400)
RBC: 2.51 MIL/uL — ABNORMAL LOW (ref 3.87–5.11)
RDW: 17.9 % — ABNORMAL HIGH (ref 11.5–15.5)
WBC: 8.9 10*3/uL (ref 4.0–10.5)

## 2017-04-25 LAB — BASIC METABOLIC PANEL
Anion gap: 9 (ref 5–15)
BUN: 24 mg/dL — ABNORMAL HIGH (ref 6–20)
CO2: 26 mmol/L (ref 22–32)
Calcium: 8.6 mg/dL — ABNORMAL LOW (ref 8.9–10.3)
Chloride: 103 mmol/L (ref 101–111)
Creatinine, Ser: 0.8 mg/dL (ref 0.44–1.00)
GFR calc Af Amer: >60 mL/min (ref >60)
GFR calc non Af Amer: >60 mL/min (ref >60)
Glucose, Bld: 113 mg/dL — ABNORMAL HIGH (ref 65–99)
Potassium: 4.1 mmol/L (ref 3.5–5.1)
Sodium: 138 mmol/L (ref 135–145)

## 2017-04-25 LAB — CBG MONITORING, ED
Glucose-Capillary: 103 mg/dL — ABNORMAL HIGH (ref 65–99)
Glucose-Capillary: 115 mg/dL — ABNORMAL HIGH (ref 65–99)
Glucose-Capillary: 120 mg/dL — ABNORMAL HIGH (ref 65–99)
Glucose-Capillary: 121 mg/dL — ABNORMAL HIGH (ref 65–99)
Glucose-Capillary: 124 mg/dL — ABNORMAL HIGH (ref 65–99)

## 2017-04-25 LAB — TROPONIN I
Troponin I: 0.09 ng/mL
Troponin I: 0.09 ng/mL (ref <0.03)
Troponin I: 0.08 ng/mL

## 2017-04-25 LAB — PREPARE RBC (CROSSMATCH)

## 2017-04-25 LAB — GLUCOSE, CAPILLARY
Glucose-Capillary: 100 mg/dL — ABNORMAL HIGH (ref 65–99)
Glucose-Capillary: 101 mg/dL — ABNORMAL HIGH (ref 65–99)
Glucose-Capillary: 113 mg/dL — ABNORMAL HIGH (ref 65–99)

## 2017-04-25 LAB — ABO/RH: ABO/RH(D): O POS

## 2017-04-25 MED ORDER — PANTOPRAZOLE SODIUM 40 MG IV SOLR
40.0000 mg | Freq: Two times a day (BID) | INTRAVENOUS | Status: DC
  Administered 2017-04-25 – 2017-04-29 (×8): 40 mg via INTRAVENOUS
  Filled 2017-04-25 (×8): qty 40

## 2017-04-25 MED ORDER — FUROSEMIDE 10 MG/ML IJ SOLN: 40.0000 mg | Freq: Every day | INTRAMUSCULAR | Status: DC

## 2017-04-25 MED ORDER — SODIUM CHLORIDE 0.9 % IV SOLN: 500.0000 mg | Freq: Every day | INTRAVENOUS | Status: DC

## 2017-04-25 MED ORDER — SODIUM CHLORIDE 0.9 % IV SOLN: 50.0000 mg | Freq: Once | INTRAVENOUS | Status: DC

## 2017-04-25 MED ORDER — SODIUM CHLORIDE 0.9 % IV SOLN: 25.0000 mg | Freq: Once | INTRAVENOUS | Status: DC

## 2017-04-25 MED ORDER — SODIUM CHLORIDE 0.9 % IV SOLN
510.0000 mg | Freq: Once | INTRAVENOUS | Status: AC
  Administered 2017-04-25: 510 mg via INTRAVENOUS
  Filled 2017-04-25: qty 17

## 2017-04-25 MED ORDER — ALLOPURINOL 100 MG PO TABS
100.0000 mg | ORAL_TABLET | Freq: Every day | ORAL | Status: DC
Start: 2017-04-25 — End: 2017-04-29
  Administered 2017-04-25 – 2017-04-29 (×5): 100 mg via ORAL
  Filled 2017-04-25 (×5): qty 1

## 2017-04-25 MED ORDER — SODIUM CHLORIDE 0.9 % IV SOLN
25.0000 mg | Freq: Once | INTRAVENOUS | Status: DC
  Filled 2017-04-25: qty 0.5

## 2017-04-25 MED ORDER — FUROSEMIDE 10 MG/ML IJ SOLN
40.0000 mg | Freq: Once | INTRAMUSCULAR | Status: DC
Start: 2017-04-25 — End: 2017-04-25

## 2017-04-25 MED ORDER — FUROSEMIDE 10 MG/ML IJ SOLN
40.0000 mg | Freq: Once | INTRAMUSCULAR | Status: AC
  Administered 2017-04-25: 40 mg via INTRAVENOUS
  Filled 2017-04-25: qty 4

## 2017-04-25 MED ORDER — FUROSEMIDE 10 MG/ML IJ SOLN
20.0000 mg | Freq: Once | INTRAMUSCULAR | Status: AC
  Administered 2017-04-25: 20 mg via INTRAVENOUS
  Filled 2017-04-25: qty 2

## 2017-04-25 MED ORDER — TORSEMIDE 5 MG PO TABS: 5.0000 mg | ORAL_TABLET | Freq: Every day | ORAL | Status: DC

## 2017-04-25 NOTE — ED Notes (Addendum)
Pt started c/o [facility] during transfusion

## 2017-04-25 NOTE — ED Notes (Signed)
GI stated pt could have water. Pt provided with a small cup.

## 2017-04-25 NOTE — Progress Notes (Signed)
Blood still transfusing at 13ml/hr, no problems reported by pt. Vs stable as charted

## 2017-04-25 NOTE — Consult Note (Signed)
Cardiology Consultation:   Patient ID: Taylor Jenkins; [id number]; [birth year]   Admit date: 04/24/2017 Date of Consult: 04/25/2017  Primary Care Provider: Bradd Canary, MD Primary Cardiologist: Dr Eden Emms in 2016 Primary Electrophysiologist:  n/a    Patient Profile:   Taylor Jenkins is a 76 y.o. female with a hx of non-obs CAD 2016 cath, IDDM, HTN, HLD, anemia, D-CHF, who is being seen today for the evaluation of atrial fib and elevated troponin at the request of Dr Darnelle Catalan.  History of Present Illness:   Taylor Jenkins was admitted in 2016 w/ CP/SOB. Her troponin was 0.35. Cath at that time showed non-obs disease. The troponin elevation was felt to be demand ischemia 2nd Diastolic CHF.   [name] has been tracking her weight. She had lost 22 lbs, was watching her intake and her blood sugars. She was doing well, but started gaining weight. She gained 14 lbs or so in the last 3 weeks.   She has been noticing brief flutters for at least a month. She is otherwise not aware of her heart rate.   She would occasionally get light-headed going up or down ladders. No real CP w/ exertion, but was having increasing DOE.  Her daughter (nurse) started noticing an irregular HR and BP up and down. She also felt that her H&H was low because she was more tired and was doing less.   A few days ago, she developed R shoulder pain and a pinching feeling in her neck. Her jaws felt tight as well. These sx were off and on.   She saw her PCP last Saturday and was dx with PNA by exam (no CXR available). She was coughing, has had a wheeze for a bout a week. She felt bad. She took the ABX but sx did not improve.   She finally came to the ER today because she was continuing to feel bad, still SOB and coughing. +LE edema for the last few days. ++orthopnea and PND. No fevers till 2 days ago, 101.    Past Medical History:  Diagnosis Date  . Anemia   . CAD (coronary artery disease) 2016   non-obstructive at cath  .  Diabetes mellitus without complication (HCC)    type 2  . Elevated troponin 2016   secondary to demand ischemia from CHF  . Hyperlipidemia associated with type 2 diabetes mellitus (HCC)   . Hypertension   . Moderate aortic stenosis     Past Surgical History:  Procedure Laterality Date  . LEFT HEART CATHETERIZATION WITH CORONARY ANGIOGRAM N/A 09/27/2014   Non-obstructive disease, CRF reduction recommended. Procedure: LEFT HEART CATHETERIZATION WITH CORONARY ANGIOGRAM;  Surgeon: Micheline Chapman, MD;  Location: [facility] CATH LAB;  Service: Cardiovascular;  Laterality: N/A;     Inpatient Medications: Scheduled Meds: . allopurinol  100 mg Oral Daily  . insulin aspart  0-9 Units Subcutaneous Q4H  . insulin glargine  10 Units Subcutaneous Daily  . metoprolol succinate  50 mg Oral Daily  . multivitamin with minerals  1 tablet Oral Daily  . pantoprazole  40 mg Intravenous Q12H   Continuous Infusions: . iron dextran (INFED/DEXFERRUM) IVPB (TEST DOSE)    . iron dextran (INFED/DEXFERRRUM) 500 MG IVPB     PRN Meds: acetaminophen, ondansetron **OR** ondansetron (ZOFRAN) IV Medication Sig  acetaminophen (TYLENOL) 325 MG tablet Take 2 tablets (650 mg total) by mouth every 4 (four) hours as needed for headache or mild pain.  allopurinol (ZYLOPRIM) 100 MG tablet Take 100 mg  by mouth daily.  aspirin EC 81 MG EC tablet Take 1 tablet (81 mg total) by mouth daily.  insulin NPH Human (HUMULIN N,NOVOLIN N) 100 UNIT/ML injection Inject 18-25 Units into the skin See admin instructions. Use 25 units every morning and use 18 units at night  levofloxacin (LEVAQUIN) 750 MG tablet Take 750 mg by mouth daily.  metFORMIN (GLUCOPHAGE) 1000 MG tablet Take 1,000 mg by mouth 2 (two) times daily with a meal.  metoprolol succinate (TOPROL-XL) 50 MG 24 hr tablet Take 50 mg by mouth daily.  Multiple Vitamin (MULTIVITAMIN) tablet Take 1 tablet by mouth daily.  nitroGLYCERIN (NITROSTAT) 0.4 MG SL tablet Place 1 tablet (0.4 mg  total) under the tongue every 5 (five) minutes x 3 doses as needed for chest pain (or severe SOB).  omega-3 acid ethyl esters (LOVAZA) 1 G capsule Take 1-2 g by mouth See admin instructions. Take 2 capsules every morning and take 1 capsule every evening  potassium chloride (MICRO-K) 10 MEQ CR capsule Take 10 mEq by mouth daily.   torsemide (DEMADEX) 10 MG tablet Take 5 mg by mouth daily.     Allergies:    Allergies  Allergen Reactions  . Penicillins Anaphylaxis  . Statins Other (See Comments)    Myalgias   . Codeine   . Prednisone Other (See Comments)    psychosis   . Lidocaine Palpitations  . Sulfa Antibiotics Rash    Social History:   Social History   Social History  . Marital status: Single    Spouse name: N/A  . Number of children: N/A  . Years of education: N/A   Occupational History  . Retired    Social History Main Topics  . Smoking status: Never Smoker  . Smokeless tobacco: Never Used  . Alcohol use No  . Drug use: No  . Sexual activity: Not on file   Other Topics Concern  . Not on file   Social History Narrative   Pt lives alone in [place]. Has been staying with her daughter for the last few days.     Family History:   The patient's family history includes Heart attack (age of onset: 40) in her brother; Heart attack (age of onset: 50) in her mother. Pt indicated that the status of her mother is unknown. She indicated that the status of her brother is unknown.   ROS:  Please see the history of present illness.  All other ROS reviewed and negative.      Physical Exam/Data:   Vitals:   04/25/17 1315 04/25/17 1330 04/25/17 1416 04/25/17 1417  BP: (!) 131/96 (!) 125/48  120/66  Pulse:  89 84   Resp: 16 (!) 22 (!) 24 15  Temp:      TempSrc:      SpO2:  100% 100%   Weight:      Height:        Intake/Output Summary (Last 24 hours) at 04/25/17 1437 Last data filed at 04/25/17 0854  Gross per 24 hour  Intake           533.83 ml  Output              1600 ml  Net         -1066.17 ml   Filed Weights   04/24/17 1522  Weight: 196 lb (88.9 kg)   Body mass index is 39.59 kg/m.  General:  Well nourished, well developed, in no acute distress HEENT: normal for age Lymph: no  adenopathy Neck: JVD 10 cm Endocrine:  No thryomegaly Vascular: No carotid bruits; 4/4 extremities pulses 2+ bilaterally without bruits  Cardiac:  normal S1, S2; RRR; 2/6 murmur  Lungs: decreased BS bases, + wheezing, few rhonchi, + rales  Abd: soft, nontender, no hepatomegaly  Ext: no edema Musculoskeletal:  No deformities, BUE and BLE strength normal and equal Skin: warm and dry  Neuro:  CNs 2-12 intact, no focal abnormalities noted Psych:  Normal affect   EKG:  The EKG was personally reviewed and demonstrates:  04/24/2017 Atrial fib, HR 106, no acute ischemic changes Telemetry:  Telemetry was personally reviewed and demonstrates:  Atrial fib  Relevant CV Studies: ECHO: 10/03/2016 Study Quality: Poor. Echo contrast used for LVEF measurement. SUMMARY The left ventricular size is normal. There is moderate concentric left ventricular hypertrophy. LV ejection fraction = >70%. Left ventricular filling pattern is impaired. The right ventricle is normal size. The right ventricular systolic function is normal. The left atrium is mildly dilated. Diffuse calcification of the aortic valve. There is moderate aortic stenosis. The peak aortic valve velocity 397 cm/s. Aortic valve peak pressure gradient is 63 mmHg. Aortic valve mean pressure gradient is 33 mmHg. The calculated aortic valve area using the continuity equation is 1.0 cm2. There is trace tricuspid regurgitation. IVC size was normal. There is no pericardial effusion.  CATH: 09/27/2014 Left mainstem: The left mainstem is patent. The vessel arises from the left cusp and it has mild distal narrowing of about 30%. Left anterior descending (LAD): The LAD is patent to the apex of the heart. There are  diffuse irregularities. The mid vessel has 40-50% stenosis. The proximal vessel has diffuse irregularity with 20-30% stenosis. The diagonal branches are patent. Left circumflex (LCx): The left circumflex is a large caliber vessel. The ostium has 30% stenosis. The mid circumflex has 30-40% stenosis. The obtuse marginal branches are patent. Right coronary artery (RCA): This is a dominant vessel. There is no significant obstruction noted. There are minimal irregularities in the proximal, mid, and distal vessel. The PDA and PLA branches are patent. Left ventriculography: Left ventricular systolic function is vigorous, LVEF is estimated at 75 %, there is no significant mitral regurgitation  Estimated Blood Loss: Minimal Final Conclusions:   1. Mild diffuse nonobstructive coronary artery disease 2. Hyperdynamic LV systolic function Recommendations: Suspect patient's symptoms related to obesity and diastolic heart failure. Recommend medical therapy for nonobstructive coronary artery disease.  Laboratory Data:  Chemistry  Recent Labs Lab 04/24/17 1538 04/25/17 0717  NA 136 138  K 4.1 4.1  CL 99* 103  CO2 24 26  GLUCOSE 108* 113*  BUN 38* 24*  CREATININE 0.83 0.80  CALCIUM 8.5* 8.6*  GFRNONAA >60 >60  GFRAA >60 >60  ANIONGAP 13 9     Recent Labs Lab 04/24/17 1538  PROT 6.8  ALBUMIN 3.4*  AST 47*  ALT 41  ALKPHOS 102  BILITOT 0.3   Hematology  Recent Labs Lab 04/24/17 1538 04/25/17 0717  WBC 12.5* 8.9  RBC 2.34* 2.51*  HGB 6.6* 7.1*  HCT 23.2* 24.5*  MCV 99.1 97.6  MCH 28.2 28.3  MCHC 28.4* 29.0*  RDW 17.9* 17.9*  PLT 272 246   Cardiac Enzymes  Recent Labs Lab 04/24/17 1538 04/24/17 2328 04/25/17 0717 04/25/17 1153  TROPONINI 0.08* 0.09* 0.09* 0.08*     BNP  Recent Labs Lab 04/24/17 1538  BNP 562.9*     Radiology/Studies:  Dg Chest Port 1 View  Result Date: 04/25/2017 CLINICAL DATA:  Shortness of breath chest radiograph 04/24/2017 EXAM:  PORTABLE CHEST 1 VIEW COMPARISON:  None. FINDINGS: Unchanged cardiomegaly with mild interstitial pulmonary edema. No pleural effusion. No pneumothorax. No focal consolidation. IMPRESSION: Cardiomegaly and mild interstitial pulmonary edema. Electronically Signed   By: Deatra Robinson M.D.   On: 04/25/2017 05:36   Dg Chest Port 1 View  Result Date: 04/24/2017 CLINICAL DATA:  Low hemoglobin and shortness of breath. EXAM: PORTABLE CHEST 1 VIEW COMPARISON:  None. FINDINGS: Interstitial coarsening that could be congestive or less likely bronchitic. No Kerley lines. Cardiomegaly and mild aortic tortuosity. No effusion or pneumothorax. Presumed hyperinflation with flat diaphragm. IMPRESSION: 1. Interstitial coarsening, favored congestive rather than bronchitic. 2. Cardiomegaly. Electronically Signed   By: Marnee Spring M.D.   On: 04/24/2017 16:25    Assessment and Plan:   1. Atrial fib, RVR - rate is a little high, but understandable in the setting of acute illness - continue home Toprol XL 50 mg for rate control - CHADS2VASC=6 (female, age x 1, HTN, DM, CHF, CAD) - anticoagulation is indicated, but cannot happen now due to GIB  2.  Acute diastolic CHF (congestive heart failure) (HCC) - unclear if the afib or GIB is contributing to her CHF - she had made dietary changes and was not using sodium, food was home-prepared - dietary compliance was good, so feel her other medical issues may be contributing  3.  Demand ischemia (HCC) - hx non-obs CAD 2016 cath - no ongoing ischemic sx - believe this is demand ischemia in the setting of anemia and CHF  Otherwise, per IM Principal Problem:   Acute blood loss anemia Active Problems:   IDDM (insulin dependent diabetes mellitus) (HCC)   HTN (hypertension)   Melena   New onset a-fib (HCC)   Symptomatic anemia     Signed, Theodore Demark, PA-C  04/25/2017 2:37 PM

## 2017-04-25 NOTE — ED Notes (Signed)
2 units blood ready.

## 2017-04-25 NOTE — ED Notes (Signed)
Attempted Report 

## 2017-04-25 NOTE — Progress Notes (Signed)
40mg  iv lasix administered at beginning of blood transfusion. Blood started infusing at 60ml/hr with no s/s of fluid overload noted, rate increased to 81ml/hr after first 15 minutes. Pt not voicing any shortness of breath or discomfort.

## 2017-04-25 NOTE — Progress Notes (Signed)
Received report on pt in ED with a blood product order and iron dextran to be administered this pm. MD text paged to verify which product to administer first. Pt hs 2 iv access points but risk of reaction or complications already reported during blood admin last night. Waiting for return call

## 2017-04-25 NOTE — Consult Note (Addendum)
[facility] Consult: 9:02 AM 04/25/2017  LOS: 1 day    Referring Provider: Dr Salome Arnt  Primary Care Physician:  Dr Watt Climes in [place].   Primary Gastroenterologist:  unassigned     Reason for Consultation:  Anemia, black/FOBT + stool.   HPI: Taylor Jenkins is a 76 y.o. female.  PMH IDDM.  Diastolic CHF.   Moderate aortic stenosis, EF > 70% on Echo 09/2016.   Htn.  Hld. GERD. Obesity, BMI 44.  Demand ischemia and acute diastolic heart failure diagnosed during admission in 09/2014.  09/2014 cardiac cath with minor, nonobstructive CAD, hyperdynamic LVEF  Osteopenia.  Gout.  Breast fibroid. S/p tonsillectomy and tubal ligation.  Anemia dating back to childhood but no previous transfusions. Has not followed through on rec for GI workup in past.  Prior stool testing for FOBT was negative.  Elevated BNP to 265 on 01/29/17.     Beginning in May when her anemia worsened (Hgb of 6.4 01/29/17), she added twice daily over-the-counter iron tablets to her daily, prenatal multivitamin with iron. Around that time she started noticing intermittent black but not necessarily tarry stools. She did not have any associated GI symptoms such as nausea, vomiting, anorexia, dysphagia, pyrosis, abdominal pain.  Hgb climbed to 11.7 of 6/26, though she was not transfused.  She has had exertional dyspnea for several months but lately it's been getting worse and she fatigues easily.  Taking Levaquin for CAP vs bronchitis.  04/20/17 Hgb 6.5 and she refused to go to ED for transfusion and refused GI referral.   Yesterday, 8/22, at PMD Hgb was 6.0.  Pt sent to ED for transfusion.  At [facility] Hgb 6.6 >> 7.1. MCV 97.  Has not had iron/B12/Folate levels.   Troponin 0.08 >>0.09.  BNP 563.  Lactic acid 2.9 >> 1.9.   AST 47 (RR 15-41) and albumin slightly low  at 3.4, otherwise normal LFTs.   CXR: CM, mild pulmonary edema.    During transfusion of her first unit of blood, she developed acute shortness of breath and transfusion was aborted.    Past Medical History:  Diagnosis Date  . Anemia   . Diabetes mellitus without complication (HCC)    type 2  . Heart murmur   . Hypertension     Past Surgical History:  Procedure Laterality Date  . LEFT HEART CATHETERIZATION WITH CORONARY ANGIOGRAM N/A 09/27/2014   Procedure: LEFT HEART CATHETERIZATION WITH CORONARY ANGIOGRAM;  Surgeon: Micheline Chapman, MD;  Location: [facility] CATH LAB;  Service: Cardiovascular;  Laterality: N/A;  . NO PAST SURGERIES      Prior to Admission medications   Medication Sig Start Date End Date Taking? Authorizing Provider  acetaminophen (TYLENOL) 325 MG tablet Take 2 tablets (650 mg total) by mouth every 4 (four) hours as needed for headache or mild pain. 09/28/14  Yes Kilroy, Luke K, PA-C  allopurinol (ZYLOPRIM) 100 MG tablet Take 100 mg by mouth daily. 01/29/17  Yes [provider]  aspirin EC 81 MG EC tablet Take 1 tablet (81 mg total) by  mouth daily. 09/28/14  Yes Kilroy, Luke K, PA-C  insulin NPH Human (HUMULIN N,NOVOLIN N) 100 UNIT/ML injection Inject 18-25 Units into the skin See admin instructions. Use 25 units every morning and use 18 units at night   Yes [provider]  levofloxacin (LEVAQUIN) 750 MG tablet Take 750 mg by mouth daily. 04/20/17  Yes [provider]  metFORMIN (GLUCOPHAGE) 1000 MG tablet Take 1,000 mg by mouth 2 (two) times daily with a meal.   Yes [provider]  metoprolol succinate (TOPROL-XL) 50 MG 24 hr tablet Take 50 mg by mouth daily. 01/29/17  Yes [provider]  Multiple Vitamin (MULTIVITAMIN) tablet Take 1 tablet by mouth daily.   Yes [provider]  nitroGLYCERIN (NITROSTAT) 0.4 MG SL tablet Place 1 tablet (0.4 mg total) under the tongue every 5 (five) minutes x 3 doses as needed for chest  pain (or severe SOB). 09/28/14  Yes Kilroy, Luke K, PA-C  omega-3 acid ethyl esters (LOVAZA) 1 G capsule Take 1-2 g by mouth See admin instructions. Take 2 capsules every morning and take 1 capsule every evening   Yes [provider]  potassium chloride (MICRO-K) 10 MEQ CR capsule Take 10 mEq by mouth daily.  08/01/15  Yes [provider]  torsemide (DEMADEX) 10 MG tablet Take 5 mg by mouth daily.  08/01/15  Yes [provider]    Scheduled Meds: . allopurinol  100 mg Oral Daily  . insulin aspart  0-9 Units Subcutaneous Q4H  . insulin glargine  10 Units Subcutaneous Daily  . metoprolol succinate  50 mg Oral Daily  . multivitamin with minerals  1 tablet Oral Daily  . [START ON 04/28/2017] pantoprazole  40 mg Intravenous Q12H   Infusions: . pantoprozole (PROTONIX) infusion 8 mg/hr (04/25/17 0837)   PRN Meds: acetaminophen, ondansetron **OR** ondansetron (ZOFRAN) IV   Allergies as of 04/24/2017 - Review Complete 04/24/2017  Allergen Reaction Noted  . Penicillins Anaphylaxis 09/25/2014  . Statins Other (See Comments) 09/25/2014  . Codeine  09/25/2014  . Prednisone Other (See Comments) 09/25/2014  . Lidocaine Palpitations 09/25/2014  . Sulfa antibiotics Rash 09/25/2014    Family History  Problem Relation Age of Onset  . Heart attack Mother 29  . Heart attack Brother 70    Social History   Social History  . Marital status: Single    Spouse name: N/A  . Number of children: N/A  . Years of education: N/A   Occupational History  . Not on file.   Social History Main Topics  . Smoking status: Never Smoker  . Smokeless tobacco: Never Used  . Alcohol use No  . Drug use: No  . Sexual activity: Not on file   Other Topics Concern  . Not on file   Social History Narrative  . No narrative on file    REVIEW OF SYSTEMS: Constitutional:  Weakness.  Weight was down 22#, then up 10# in last year or so.  ENT:  No nose bleeds Pulm:  Per HPI.  Dry  cough CV:  No palpitations, no LE edema.  GU:  No hematuria, no frequency GI:  Per HPI Heme:  No unusual or excessive bleeding/bruising.   Transfusions:  Per HPI Neuro:  No headaches, no peripheral tingling or numbness Derm: Pruritic lesions on the upper back, previously had these on her abdomen but they have resolved. Previously had nonpruritic lesions on her lower legs. Had been told many years ago, when she hadn't have insurance  for full workup, that she could possibly have lupus Endocrine:  No sweats or chills.  No polyuria or dysuria Immunization:  Did not inquire as to recent or previous immunizations. Travel:  None beyond local counties in last few months.    PHYSICAL EXAM: Vital signs in last 24 hours: Vitals:   04/25/17 0815 04/25/17 0830  BP: 121/78 133/67  Pulse: 94 99  Resp:  16  Temp:    SpO2: 100% 100%   Wt Readings from Last 3 Encounters:  04/24/17 88.9 kg (196 lb)  08/02/15 98 kg (216 lb)  09/28/14 96.1 kg (211 lb 13.8 oz)    General: Obese, chronically ill appearing WF who is anxious. Head:  No facial asymmetry or swelling. No signs of head trauma.  Eyes:  No scleral icterus.  Conjunctiva pale. Ears:  Not hard of hearing  Nose:  No congestion or discharge Mouth:  Moist, clear oropharynx. Tongue midline. Neck:  No JVD, no thyromegaly, no masses. Lungs:  Wheezing throughout. No crackles or rhonchi. Some shortness of breath with prolonged speaking Heart: RRR. S1, S2 audible. 2 to 3/6 systolic murmur.   Abdomen:  Obese. Soft. Nontender. Active bowel sounds. No distention. No obvious hernias. No bruits..   Rectal: Deferred rectal exam. Stool was FOBT positive in the lab.   Musc/Skeltl: No joint erythema or swelling. Extremities:  Slight, nonpitting pedal/ankle edema.  Neurologic:  Alert. Oriented times 3. Moves all 4 limbs. No limb weakness. No tremors. Skin:  Multiple lesions on the upper back in various states of healing with scarring from obvious scratch  marks. Tattoos:  None Nodes:  No cervical adenopathy.   Psych:  Cooperative. Somewhat anxious. Appropriate  Intake/Output from previous day: 08/22 0701 - 08/23 0700 In: 533.8 [Blood:133.8; IV Piggyback:400] Out: 600 [Urine:600] Intake/Output this shift: Total I/O In: -  Out: 1000 [Urine:1000]  LAB RESULTS:  Recent Labs  04/24/17 1538 04/25/17 0717  WBC 12.5* 8.9  HGB 6.6* 7.1*  HCT 23.2* 24.5*  PLT 272 246   BMET Lab Results  Component Value Date   NA 136 04/24/2017   NA 140 09/27/2014   NA 141 09/26/2014   K 4.1 04/24/2017   K 3.7 09/27/2014   K 4.2 09/26/2014   CL 99 (L) 04/24/2017   CL 101 09/27/2014   CL 99 09/26/2014   CO2 24 04/24/2017   CO2 26 09/27/2014   CO2 28 09/26/2014   GLUCOSE 108 (H) 04/24/2017   GLUCOSE 109 (H) 09/27/2014   GLUCOSE 140 (H) 09/26/2014   BUN 38 (H) 04/24/2017   BUN 26 (H) 09/27/2014   BUN 24 (H) 09/26/2014   CREATININE 0.83 04/24/2017   CREATININE 0.74 09/27/2014   CREATININE 0.78 09/26/2014   CALCIUM 8.5 (L) 04/24/2017   CALCIUM 9.1 09/27/2014   CALCIUM 9.4 09/26/2014   LFT  Recent Labs  04/24/17 1538  PROT 6.8  ALBUMIN 3.4*  AST 47*  ALT 41  ALKPHOS 102  BILITOT 0.3   PT/INR Lab Results  Component Value Date   INR 1.30 09/27/2014   Hepatitis Panel No results for input(s): HEPBSAG, HCVAB, HEPAIGM, HEPBIGM in the last 72 hours. C-Diff No components found for: CDIFF Lipase  No results found for: LIPASE  Drugs of Abuse  No results found for: LABOPIA, COCAINSCRNUR, LABBENZ, AMPHETMU, THCU, LABBARB   RADIOLOGY STUDIES: Dg Chest Port 1 View  Result Date: 04/25/2017 CLINICAL DATA:  Shortness of breath chest radiograph 04/24/2017 EXAM: PORTABLE CHEST 1 VIEW COMPARISON:  None. FINDINGS: Unchanged  cardiomegaly with mild interstitial pulmonary edema. No pleural effusion. No pneumothorax. No focal consolidation. IMPRESSION: Cardiomegaly and mild interstitial pulmonary edema. Electronically Signed   By: Deatra Robinson M.D.   On: 04/25/2017 05:36   Dg Chest Port 1 View  Result Date: 04/24/2017 CLINICAL DATA:  Low hemoglobin and shortness of breath. EXAM: PORTABLE CHEST 1 VIEW COMPARISON:  None. FINDINGS: Interstitial coarsening that could be congestive or less likely bronchitic. No Kerley lines. Cardiomegaly and mild aortic tortuosity. No effusion or pneumothorax. Presumed hyperinflation with flat diaphragm. IMPRESSION: 1. Interstitial coarsening, favored congestive rather than bronchitic. 2. Cardiomegaly. Electronically Signed   By: Marnee Spring M.D.   On: 04/24/2017 16:25     IMPRESSION:   *  FOBT positive anemia with intermittent black stools. Anemia persists despite twice daily over-the-counter iron as well as prenatal vitamin with iron daily. History of anemia dates back to childhood according to the patient. However she's never required previous transfusions. Her hemoglobin has dropped ~ 5 g in the last 2 months. She is symptomatic with worsening DOE and fatigue. No previous GI workup.  *  Transfusion reaction with acute shortness of breath. Felt to be transfusion associated circulatory overload.    PLAN:     *  Patient requires upper and lower endoscopy once her anemia and respiratory issues have resolved. She is aware of the need for these procedures and is willing to undergo testing in the future.   Jennye Moccasin  04/25/2017, 9:02 AM Pager: [phone]  ________________________________________________________________________  [facility] MD note:  I personally examined the patient, reviewed the data and agree with the assessment and plan described above.  Heme positive stool with symptomatic anemia.  She needs GI workup (colonoscopy and EGD), ideally after her Hb is a bit higher (8-9).  I discussed this with Dr. Darnelle Catalan hopefully will be able to restart blood transfusion today, much slower. Possibly colo/egd on Saturday.  She is in new afib also and I understand cardiology will be seeing her  soon as well.   Will follow, thanks  Rob Bunting, MD [facility] Pager [phone]

## 2017-04-25 NOTE — ED Notes (Signed)
Admitting MD advising to hold all basil insulin at this time

## 2017-04-25 NOTE — Progress Notes (Signed)
Pharmacy: IV Iron Replacement  75yof admitted with heme positive stool and symptomatic anemia despite taking iron supplements at home. Pharmacy asked to dose IV iron. No anemia panel on admission.  Plan: 1) Give Feraheme 510mg  IV x 1 tonight 2) Order anemia panel for 8/24 and give another dose if appropriate   Louie Casa, PharmD, BCPS 04/25/2017, 5:26 PM

## 2017-04-25 NOTE — Significant Event (Addendum)
Patient with SOB during transfusion of first unit.  Transfusion stopped.  SOB improved.  CXR ordered and pending.  Strongly suspect TACO.  Patient got about half of first unit of blood in.  Will have them hold off on giving the rest and hold the second unit as well for now while CXR pending.  On evaluation patient does have 2+ pitting edema BLE supporting taco.  Will give 20mg  IV lasix, to gently diurese, and hope to resume transfusion in a couple of hours after some diuresis.

## 2017-04-25 NOTE — ED Notes (Signed)
Patient transferred to a hospital bed.

## 2017-04-25 NOTE — Progress Notes (Signed)
Progress Note    Taylor Jenkins  [id number] DOB: March 21, 1941  DOA: 04/24/2017 PCP: Bradd Canary, MD    Brief Narrative:   Chief complaint: Follow-up symptomatic anemia  Medical records reviewed and are as summarized below:  Taylor Jenkins is an 76 y.o. female with a PMH of nonobstructive CAD status post cardiac catheterization 2016, insulin-dependent diabetes, hypertension, hyperlipidemia, moderate aortic stenosis, diastolic CHF and chronic anemia who was admitted 04/24/17 for generalized weakness, lack of energy, weight gain, fever, and recent diagnosis of pneumonia by PCP. Upon initial evaluation in the ED, she was found to have a hemoglobin of 6.6 mg/dL.  Assessment/Plan:   Principal Problem:   Likely chronic blood loss, symptomatic anemia from GI losses/melena Transfused but unable to tolerate PRBCs this morning.  Developed SOB. Will re-attempt and run blood in slowly over 4 hours.  Give IV iron as well.  GI consulted and recommends further endoscopic evaluation. Continue PPI.  Active Problems:   Demand ischemia (HCC) Troponin elevation occurred in the setting of afib w/ RVR and profound anemia. This is most likely demand ischemia. H/O mild diffuse non-obstructive CAD.  Evaluated by cardiology with recommendations to treat medically.    Acute diastolic CHF (congestive heart failure) (HCC) Likely from rapid afib. Control rate. Give lasix at initiation of PRBCs.    IDDM (insulin dependent diabetes mellitus) (HCC) Currently being managed by Lantus 10 units and insulin sensitive SSI Q 4 hours. CBGs 115-121.    HTN (hypertension) Continue metoprolol.    New onset a-fib (HCC) CHADS2VASC=6 (female, age x 1, HTN, DM, CHF, CAD). No prior history of afib, and may simply be triggered by severe anemia. Continue beta blocker for rate control. Not a candidate for anti-coagulation in the setting of GI bleeding.    Obesity Body mass index is 39.59 kg/m.   Family  Communication/Anticipated D/C date and plan/Code Status   DVT prophylaxis: SCDs ordered. Code Status: Full Code.  Family Communication: Daughter at the bedside. Disposition Plan: Home when hgb stable.   Medical Consultants:    Gastroenterology  Cardiology   Anti-Infectives:    None  Subjective:   The patient tells me she has been weak, dizzy. Her stools are intermittently dark in color.  Objective:    Vitals:   04/25/17 0730 04/25/17 0745 04/25/17 0815 04/25/17 0830  BP: 134/68 110/65 121/78 133/67  Pulse: 96 97 94 99  Resp:  19  16  Temp:      TempSrc:      SpO2: 100% 100% 100% 100%  Weight:      Height:        Intake/Output Summary (Last 24 hours) at 04/25/17 0848 Last data filed at 04/25/17 0831  Gross per 24 hour  Intake           533.83 ml  Output             1000 ml  Net          -466.17 ml   Filed Weights   04/24/17 1522  Weight: 88.9 kg (196 lb)    Exam: General: No acute distress. Cardiovascular: Heart sounds show an irregular rhythm, mildly tachycardic rate. No gallops or rubs. III/VI systolic murmur. No JVD. Lungs: Clear to auscultation bilaterally with good air movement. No rales, rhonchi or wheezes. Abdomen: Soft, nontender, nondistended with normal active bowel sounds. No masses. No hepatosplenomegaly. Neurological: Alert and oriented 3. Moves all extremities 4 with equal strength. Cranial nerves II through XII grossly  intact. Skin: Warm and dry. No rashes or lesions. Extremities: No clubbing or cyanosis. No edema. Pedal pulses 2+. Psychiatric: Mood and affect are mildly anxious. Insight and judgment are mildly impaired.   Data Reviewed:   I have personally reviewed following labs and imaging studies:  Labs: Labs show the following:   Basic Metabolic Panel:  Recent Labs Lab 04/24/17 1538  NA 136  K 4.1  CL 99*  CO2 24  GLUCOSE 108*  BUN 38*  CREATININE 0.83  CALCIUM 8.5*   GFR Estimated Creatinine Clearance: 57.7  mL/min (by C-G formula based on SCr of 0.83 mg/dL). Liver Function Tests:  Recent Labs Lab 04/24/17 1538  AST 47*  ALT 41  ALKPHOS 102  BILITOT 0.3  PROT 6.8  ALBUMIN 3.4*   No results for input(s): LIPASE, AMYLASE in the last 168 hours. No results for input(s): AMMONIA in the last 168 hours. Coagulation profile No results for input(s): INR, PROTIME in the last 168 hours.  CBC:  Recent Labs Lab 04/24/17 1538 04/25/17 0717  WBC 12.5* 8.9  NEUTROABS 9.2*  --   HGB 6.6* 7.1*  HCT 23.2* 24.5*  MCV 99.1 97.6  PLT 272 246   Cardiac Enzymes:  Recent Labs Lab 04/24/17 1538 04/24/17 2328  TROPONINI 0.08* 0.09*   BNP (last 3 results) No results for input(s): PROBNP in the last 8760 hours. CBG:  Recent Labs Lab 04/25/17 0057 04/25/17 0428 04/25/17 0836  GLUCAP 120* 115* 121*   D-Dimer: No results for input(s): DDIMER in the last 72 hours. Hgb A1c: No results for input(s): HGBA1C in the last 72 hours. Lipid Profile: No results for input(s): CHOL, HDL, LDLCALC, TRIG, CHOLHDL, LDLDIRECT in the last 72 hours. Thyroid function studies:  Recent Labs  04/24/17 1538  TSH 1.413   Anemia work up: No results for input(s): VITAMINB12, FOLATE, FERRITIN, TIBC, IRON, RETICCTPCT in the last 72 hours. Sepsis Labs:  Recent Labs Lab 04/24/17 1538 04/24/17 1625 04/24/17 2004 04/25/17 0717  WBC 12.5*  --   --  8.9  LATICACIDVEN  --  2.97* 1.90  --     Microbiology No results found for this or any previous visit (from the past 240 hour(s)).  Procedures and diagnostic studies:  04/24/17 Dg Chest Port 1 View:  1. Interstitial coarsening, favored congestive rather than bronchitic. 2. Cardiomegaly.   04/25/2017 CXR: My independent review of the image shows: Mild edema as pictured. Significant ardiomegaly.      Medications:   . allopurinol  100 mg Oral Daily  . insulin aspart  0-9 Units Subcutaneous Q4H  . insulin glargine  10 Units Subcutaneous Daily  .  metoprolol succinate  50 mg Oral Daily  . multivitamin with minerals  1 tablet Oral Daily  . [START ON 04/28/2017] pantoprazole  40 mg Intravenous Q12H   Continuous Infusions: . pantoprozole (PROTONIX) infusion 8 mg/hr (04/25/17 0837)     LOS: 1 day   Level 3  RAMA,CHRISTINA  [facility] Pager [phone]. If unable to reach me by pager, please call my cell phone at [phone].  *Please refer to [email or web address], password [email or web address] to get updated schedule on who will round on this patient, as hospitalists switch teams weekly. If 7PM-7AM, please contact night-coverage at [email or web address], password [email or web address] for any overnight needs.  04/25/2017, 8:48 AM

## 2017-04-26 ENCOUNTER — Inpatient Hospital Stay (HOSPITAL_COMMUNITY): Payer: Medicare Other

## 2017-04-26 DIAGNOSIS — R0602 Shortness of breath: Secondary | ICD-10-CM

## 2017-04-26 DIAGNOSIS — I350 Nonrheumatic aortic (valve) stenosis: Secondary | ICD-10-CM

## 2017-04-26 DIAGNOSIS — I4891 Unspecified atrial fibrillation: Secondary | ICD-10-CM | POA: Diagnosis present

## 2017-04-26 DIAGNOSIS — I361 Nonrheumatic tricuspid (valve) insufficiency: Secondary | ICD-10-CM

## 2017-04-26 HISTORY — DX: Nonrheumatic aortic (valve) stenosis: I35.0

## 2017-04-26 LAB — TYPE AND SCREEN
ABO/RH(D): O POS
Antibody Screen: NEGATIVE
Unit division: 0
Unit division: 0

## 2017-04-26 LAB — ECHOCARDIOGRAM COMPLETE
AO mean calculated velocity dopler: 315 cm/s
AV Area VTI index: 0.36 cm2/m2
AV Area VTI: 0.65 cm2
AV Area mean vel: 0.64 cm2
AV Mean grad: 44 mmHg
AV Peak grad: 68 mmHg
AV VEL mean LVOT/AV: 0.25
AV area mean vel ind: 0.35 cm2/m2
AV peak Index: 0.36
AV pk vel: 412 cm/s
AV vel: 0.65
Ao pk vel: 0.25 m/s
FS: 30 % (ref 28–44)
Height: 59 in
IVS/LV PW RATIO, ED: 0.83
LA ID, A-P, ES: 47 mm
LA diam end sys: 47 mm
LA diam index: 2.57 cm/m2
LA vol A4C: 98.7 ml
LV PW d: 12.8 mm — AB (ref 0.6–1.1)
LVOT SV: 60 mL
LVOT VTI: 23.8 cm
LVOT area: 2.54 cm2
LVOT diameter: 18 mm
LVOT peak VTI: 0.26 cm
LVOT peak vel: 105 cm/s
TAPSE: 21.9 mm
VTI: 93.3 cm
Valve area index: 0.36
Valve area: 0.65 cm2
Weight: 3128.77 oz

## 2017-04-26 LAB — BASIC METABOLIC PANEL
Anion gap: 11 (ref 5–15)
BUN: 18 mg/dL (ref 6–20)
CO2: 29 mmol/L (ref 22–32)
Calcium: 8.8 mg/dL — ABNORMAL LOW (ref 8.9–10.3)
Chloride: 99 mmol/L — ABNORMAL LOW (ref 101–111)
Creatinine, Ser: 0.87 mg/dL (ref 0.44–1.00)
GFR calc Af Amer: >60 mL/min (ref >60)
GFR calc non Af Amer: >60 mL/min (ref >60)
Glucose, Bld: 114 mg/dL — ABNORMAL HIGH (ref 65–99)
Potassium: 3.7 mmol/L (ref 3.5–5.1)
Sodium: 139 mmol/L (ref 135–145)

## 2017-04-26 LAB — BRAIN NATRIURETIC PEPTIDE: B Natriuretic Peptide: 723 pg/mL — ABNORMAL HIGH (ref 0.0–100.0)

## 2017-04-26 LAB — BPAM RBC
Blood Product Expiration Date: 201809152359
Blood Product Expiration Date: 201809152359
ISSUE DATE / TIME: 201808230139
ISSUE DATE / TIME: 201808231712
Unit Type and Rh: 5100
Unit Type and Rh: 5100

## 2017-04-26 LAB — CBC
HCT: 28.1 % — ABNORMAL LOW (ref 36.0–46.0)
Hemoglobin: 8.2 g/dL — ABNORMAL LOW (ref 12.0–15.0)
MCH: 27.7 pg (ref 26.0–34.0)
MCHC: 29.2 g/dL — ABNORMAL LOW (ref 30.0–36.0)
MCV: 94.9 fL (ref 78.0–100.0)
Platelets: 243 10*3/uL (ref 150–400)
RBC: 2.96 MIL/uL — ABNORMAL LOW (ref 3.87–5.11)
RDW: 18.7 % — ABNORMAL HIGH (ref 11.5–15.5)
WBC: 8.9 10*3/uL (ref 4.0–10.5)

## 2017-04-26 LAB — MAGNESIUM: Magnesium: 2.1 mg/dL (ref 1.7–2.4)

## 2017-04-26 LAB — IRON AND TIBC
Iron: 321 ug/dL — ABNORMAL HIGH (ref 28–170)
Saturation Ratios: 74 % — ABNORMAL HIGH (ref 10.4–31.8)
TIBC: 435 ug/dL (ref 250–450)
UIBC: 114 ug/dL

## 2017-04-26 LAB — FOLATE: Folate: 77.4 ng/mL (ref >5.9)

## 2017-04-26 LAB — FERRITIN: Ferritin: 14 ng/mL (ref 11–307)

## 2017-04-26 LAB — GLUCOSE, CAPILLARY
Glucose-Capillary: 110 mg/dL — ABNORMAL HIGH (ref 65–99)
Glucose-Capillary: 213 mg/dL — ABNORMAL HIGH (ref 65–99)
Glucose-Capillary: 124 mg/dL — ABNORMAL HIGH (ref 65–99)
Glucose-Capillary: 170 mg/dL — ABNORMAL HIGH (ref 65–99)

## 2017-04-26 LAB — RETICULOCYTES
RBC.: 2.96 MIL/uL — ABNORMAL LOW (ref 3.87–5.11)
Retic Count, Absolute: 233.8 10*3/uL — ABNORMAL HIGH (ref 19.0–186.0)
Retic Ct Pct: 7.9 % — ABNORMAL HIGH (ref 0.4–3.1)

## 2017-04-26 LAB — VITAMIN B12: Vitamin B-12: 3970 pg/mL — ABNORMAL HIGH (ref 180–914)

## 2017-04-26 LAB — HEMOGLOBIN AND HEMATOCRIT, BLOOD
HCT: 29 % — ABNORMAL LOW (ref 36.0–46.0)
Hemoglobin: 8.7 g/dL — ABNORMAL LOW (ref 12.0–15.0)

## 2017-04-26 MED ORDER — LEVALBUTEROL HCL 0.63 MG/3ML IN NEBU
0.6300 mg | INHALATION_SOLUTION | Freq: Four times a day (QID) | RESPIRATORY_TRACT | Status: DC
  Administered 2017-04-26 – 2017-04-27 (×6): 0.63 mg via RESPIRATORY_TRACT
  Filled 2017-04-26 (×6): qty 3

## 2017-04-26 MED ORDER — LEVALBUTEROL HCL 0.63 MG/3ML IN NEBU: 0.6300 mg | INHALATION_SOLUTION | Freq: Four times a day (QID) | RESPIRATORY_TRACT | Status: DC

## 2017-04-26 MED ORDER — POTASSIUM CHLORIDE CRYS ER 20 MEQ PO TBCR
40.0000 meq | EXTENDED_RELEASE_TABLET | Freq: Once | ORAL | Status: AC
  Administered 2017-04-26: 40 meq via ORAL
  Filled 2017-04-26: qty 2

## 2017-04-26 MED ORDER — METOPROLOL SUCCINATE ER 25 MG PO TB24
25.0000 mg | ORAL_TABLET | Freq: Every day | ORAL | Status: DC
  Administered 2017-04-27: 25 mg via ORAL
  Filled 2017-04-26: qty 1

## 2017-04-26 MED ORDER — FUROSEMIDE 10 MG/ML IJ SOLN
40.0000 mg | Freq: Once | INTRAMUSCULAR | Status: AC
  Administered 2017-04-26: 40 mg via INTRAVENOUS
  Filled 2017-04-26: qty 4

## 2017-04-26 MED ORDER — FUROSEMIDE 10 MG/ML IJ SOLN: 20.0000 mg | Freq: Every day | INTRAMUSCULAR | Status: DC

## 2017-04-26 MED ORDER — POTASSIUM CHLORIDE CRYS ER 20 MEQ PO TBCR
20.0000 meq | EXTENDED_RELEASE_TABLET | Freq: Every day | ORAL | Status: DC
  Administered 2017-04-27 – 2017-04-29 (×3): 20 meq via ORAL
  Filled 2017-04-26 (×4): qty 1

## 2017-04-26 NOTE — Progress Notes (Signed)
[facility] Progress Note    Since last GI note: She feels much better after 1 unit blood transfusion and iron infusion.  Objective: Vital signs in last 24 hours: Temp:  [97.9 F (36.6 C)-98.7 F (37.1 C)] 98.6 F (37 C) (08/24 0530) Pulse Rate:  [46-101] 71 (08/24 0530) Resp:  [10-25] 19 (08/24 0530) BP: (100-147)/(48-99) 121/61 (08/24 0530) SpO2:  [93 %-100 %] 100 % (08/24 0530) Weight:  [195 lb 8.8 oz (88.7 kg)] 195 lb 8.8 oz (88.7 kg) (08/23 2014) Last BM Date: 04/23/17 General: alert and oriented times 3 Heart: regular rate and rythm Abdomen: soft, non-tender, non-distended, normal bowel sounds   Lab Results:  Recent Labs  04/24/17 1538 04/25/17 0717 04/26/17 0305  WBC 12.5* 8.9 8.9  HGB 6.6* 7.1* 8.2*  PLT 272 246 243  MCV 99.1 97.6 94.9    Recent Labs  04/24/17 1538 04/25/17 0717 04/26/17 0305  NA 136 138 139  K 4.1 4.1 3.7  CL 99* 103 99*  CO2 _0 GLUCOSE 108* 113* 114*  BUN 38* 24* 18  CREATININE 0.83 0.80 0.87  CALCIUM 8.5* 8.6* 8.8*    Recent Labs  04/24/17 1538  PROT 6.8  ALBUMIN 3.4*  AST 47*  ALT 41  ALKPHOS 102  BILITOT 0.3    Medications: Scheduled Meds: . allopurinol  100 mg Oral Daily  . furosemide  20 mg Intravenous Daily  . insulin aspart  0-9 Units Subcutaneous Q4H  . insulin glargine  10 Units Subcutaneous Daily  . metoprolol succinate  50 mg Oral Daily  . multivitamin with minerals  1 tablet Oral Daily  . pantoprazole  40 mg Intravenous Q12H   Continuous Infusions: PRN Meds:.acetaminophen, ondansetron **OR** ondansetron (ZOFRAN) IV   Assessment/Plan: 76 y.o. female with heme positive severe anemia  I think she is OK for GI testing however the patient is reluctant to consent.  Her daughter Chiropractor, with whom I spoke with on the phone this AM) is advising against testing until she is more comfortable about her cardiac situation. She would like reassurance from cardiology team that with her  elevated BNP she is OK for sedation (MAC) for colonoscopy and upper endoscopy.  I will relayed this to Ms. Dunn from cardiology.  She already ate her breakfast this AM.  Please call or page when the patient consents to GI testing.    Thanks Johny Shock, MD  04/26/2017, 8:58 AM [facility] Pager [phone]

## 2017-04-26 NOTE — Progress Notes (Signed)
Progress Note    Taylor Jenkins  [id number] DOB: [birth year]  DOA: 04/24/2017 PCP: Bradd Canary, MD    Brief Narrative:   Chief complaint: Follow-up symptomatic anemia  Medical records reviewed and are as summarized below:  Taylor Jenkins is an 76 y.o. female with a PMH of nonobstructive CAD status post cardiac catheterization 2016, insulin-dependent diabetes, hypertension, hyperlipidemia, moderate aortic stenosis, diastolic CHF and chronic anemia who was admitted 04/24/17 for generalized weakness, lack of energy, weight gain, fever, and recent diagnosis of pneumonia by PCP. Upon initial evaluation in the ED, she was found to have a hemoglobin of 6.6 mg/dL.  Assessment/Plan:   Principal Problem:   Likely chronic blood loss, symptomatic anemia from GI losses/melena Hemoglobin 8.2 after 1 unit of PRBCs and approximately 110 mL of a second unit. Was also given IV iron 04/25/17.  GI consulted and recommends further endoscopic evaluation, However the patient's daughters are nurses and they are reluctant to have her put under conscious sedation and want assurance from the cardiologist that it is safe to do this. Continue PPI.  Active Problems:   Demand ischemia (HCC)/severe aortic stenosis Troponin elevation occurred in the setting of afib w/ RVR and profound anemia. This is most likely demand ischemia. H/O mild diffuse non-obstructive CAD.  Evaluated by cardiology with recommendations to treat medically. 2-D echo performed 04/26/17: EF 65-70 percent with severe aortic stenosis. May need referral to CVTS if the family willing to consider definitive treatment.    Acute diastolic CHF (congestive heart failure) (HCC) Likely from rapid afib. Heart rate has now been controlled. Repeat EKG personally reviewed and shows A. fib with controlled ventricular rate at 87 bpm, no ischemic changes. I/O balance -1.1 L.     IDDM (insulin dependent diabetes mellitus) (HCC) Currently being managed by Lantus  10 units and insulin sensitive SSI Q 4 hours. CBGs 100-113.    HTN (hypertension) Continue metoprolol.    New onset a-fib (HCC) CHADS2VASC=6 (female, age x 1, HTN, DM, CHF, CAD). No prior history of afib, and may simply be triggered by severe anemia. Continue beta blocker for rate control. Not a candidate for anti-coagulation in the setting of GI bleeding.    Obesity Body mass index is 39.5 kg/m.   Family Communication/Anticipated D/C date and plan/Code Status   DVT prophylaxis: SCDs ordered. Code Status: Full Code.  Family Communication: Daughter by telephone, son at the bedside. Disposition Plan: Home when hgb stable.   Medical Consultants:    Gastroenterology  Cardiology   Anti-Infectives:    None  Subjective:   The patient reports shortness of breath, frothy sputum. No dizziness. No BMs since admission.  Objective:    Vitals:   04/25/17 1735 04/25/17 1835 04/25/17 2014 04/26/17 0530  BP: 123/61 (!) 122/58 104/67 121/61  Pulse: 85 88 (!) 46 71  Resp: 18 16 18 19   Temp: 98.3 F (36.8 C) 98.6 F (37 C) 98.7 F (37.1 C) 98.6 F (37 C)  TempSrc: Oral Oral Oral Oral  SpO2: 100% 100% 100% 100%  Weight:   88.7 kg (195 lb 8.8 oz)   Height:        Intake/Output Summary (Last 24 hours) at 04/26/17 0808 Last data filed at 04/26/17 6122  Gross per 24 hour  Intake           767.92 ml  Output             1925 ml  Net         -  1157.08 ml   Filed Weights   04/24/17 1522 04/25/17 2014  Weight: 88.9 kg (196 lb) 88.7 kg (195 lb 8.8 oz)    Exam: General: No acute distress. Cardiovascular: Heart sounds irregularly irregular. No gallops or rubs. 3/6 systolic ejection murmur. No JVD. Lungs: Clear to auscultation bilaterally with good air movement. No rales, rhonchi or wheezes. Abdomen: Soft, nontender, nondistended with normal active bowel sounds. No masses. No hepatosplenomegaly. Neurological: Alert and oriented 3. Moves all extremities 4 with equal strength.  Cranial nerves II through XII grossly intact. Skin: Warm and dry. No rashes or lesions. Extremities: No clubbing or cyanosis. No edema. Pedal pulses 2+. Psychiatric: Mood and affect are normal. Insight and judgment are impaired.  Data Reviewed:   I have personally reviewed following labs and imaging studies:  Labs: Labs show the following: Sodium 139, potassium 3.7, chloride 99, bicarbonate 29, BUN 18, creatinine 0.87, calcium 8.8. WBC 8.9, hemoglobin 8.2--->8.7, hematocrit 28.1, platelets 243.  LFTs WNL with the exception of AST 47 and albumin 3.4. Troponin: 0.08, 0.09, 0.09, 0.08.  TSH 1.413.  Vitamin B-12 3970, folate 77.4, ferritin 14, TIBC 435, iron 321, reticulocyte count 7.9.   Microbiology 04/24/17: Blood culture left hand: Negative to date.  04/24/17: Blood culture left antecubital: Negative to date.  Procedures and diagnostic studies:  04/24/17 Dg Chest Port 1 View:  1. Interstitial coarsening, favored congestive rather than bronchitic. 2. Cardiomegaly.   04/25/2017 CXR: My independent review of the image shows: Mild edema as pictured. Significant cardiomegaly.      Medications:   . allopurinol  100 mg Oral Daily  . furosemide  40 mg Intravenous Daily  . insulin aspart  0-9 Units Subcutaneous Q4H  . insulin glargine  10 Units Subcutaneous Daily  . metoprolol succinate  50 mg Oral Daily  . multivitamin with minerals  1 tablet Oral Daily  . pantoprazole  40 mg Intravenous Q12H   Continuous Infusions:    LOS: 2 days   Greater than 35 minutes spent with this patient with greater than 50% of the time in face-to-face contact providing extensive explanations regarding diagnostic testing, diagnostic impression, and plan of care to both the patient and her family.  RAMA,CHRISTINA  [facility] Pager [phone]. If unable to reach me by pager, please call my cell phone at [phone].  *Please refer to [email or web address], password [email or web address] to get updated schedule on  who will round on this patient, as hospitalists switch teams weekly. If 7PM-7AM, please contact night-coverage at [email or web address], password [email or web address] for any overnight needs.  04/26/2017, 8:08 AM

## 2017-04-26 NOTE — Progress Notes (Signed)
Progress Note  Patient Name: Taylor Jenkins Date of Encounter: 04/26/2017  Primary Cardiologist: Remotely Dr. Eden Emms  Subjective   Feeling tremendously better today s/p blood, iron, IV Lasix. Edema near resolved. Still wheezy. Denies firsthand tobacco abuse but reports longstanding secondhand exposure.  Inpatient Medications    Scheduled Meds: . allopurinol  100 mg Oral Daily  . furosemide  40 mg Intravenous Daily  . insulin aspart  0-9 Units Subcutaneous Q4H  . insulin glargine  10 Units Subcutaneous Daily  . metoprolol succinate  50 mg Oral Daily  . multivitamin with minerals  1 tablet Oral Daily  . pantoprazole  40 mg Intravenous Q12H   Continuous Infusions:  PRN Meds: acetaminophen, ondansetron **OR** ondansetron (ZOFRAN) IV   Vital Signs    Vitals:   04/25/17 1735 04/25/17 1835 04/25/17 2014 04/26/17 0530  BP: 123/61 (!) 122/58 104/67 121/61  Pulse: 85 88 (!) 46 71  Resp: 18 16 18 19   Temp: 98.3 F (36.8 C) 98.6 F (37 C) 98.7 F (37.1 C) 98.6 F (37 C)  TempSrc: Oral Oral Oral Oral  SpO2: 100% 100% 100% 100%  Weight:   195 lb 8.8 oz (88.7 kg)   Height:        Intake/Output Summary (Last 24 hours) at 04/26/17 0854 Last data filed at 04/26/17 1610  Gross per 24 hour  Intake           767.92 ml  Output              925 ml  Net          -157.08 ml   Filed Weights   04/24/17 1522 04/25/17 2014  Weight: 196 lb (88.9 kg) 195 lb 8.8 oz (88.7 kg)    Telemetry    Persistent atrial fib rates controlled, brief run NSVT vs abberrancy (9 beats) - Personally Reviewed  Physical Exam   GEN: No acute distress.  HEENT: Normocephalic, atraumatic, sclera non-icteric. Neck: No JVD or bruits. Cardiac: Irregularly irregular, rate controlled, 2/6 SEM, no rubs or gallops.  Radials/DP/PT 1+ and equal bilaterally.  Respiratory: Moderate air movement with diffuse end expiratory wheezing. Breathing is unlabored. GI: Soft, nontender, non-distended, BS +x 4. MS: no  deformity. Extremities: No clubbing or cyanosis. Trace pedal edema. Distal pedal pulses are 2+ and equal bilaterally. Neuro:  AAOx3. Follows commands. Psych:  Responds to questions appropriately with a normal affect.  Labs    Chemistry Recent Labs Lab 04/24/17 1538 04/25/17 0717 04/26/17 0305  NA 136 138 139  K 4.1 4.1 3.7  CL 99* 103 99*  CO2 24 26 29   GLUCOSE 108* 113* 114*  BUN 38* 24* 18  CREATININE 0.83 0.80 0.87  CALCIUM 8.5* 8.6* 8.8*  PROT 6.8  --   --   ALBUMIN 3.4*  --   --   AST 47*  --   --   ALT 41  --   --   ALKPHOS 102  --   --   BILITOT 0.3  --   --   GFRNONAA >60 >60 >60  GFRAA >60 >60 >60  ANIONGAP 13 9 11      Hematology Recent Labs Lab 04/24/17 1538 04/25/17 0717 04/26/17 0305  WBC 12.5* 8.9 8.9  RBC 2.34* 2.51* 2.96*  2.96*  HGB 6.6* 7.1* 8.2*  HCT 23.2* 24.5* 28.1*  MCV 99.1 97.6 94.9  MCH 28.2 28.3 27.7  MCHC 28.4* 29.0* 29.2*  RDW 17.9* 17.9* 18.7*  PLT 272 246 243  Cardiac Enzymes Recent Labs Lab 04/24/17 1538 04/24/17 2328 04/25/17 0717 04/25/17 1153  TROPONINI 0.08* 0.09* 0.09* 0.08*   No results for input(s): TROPIPOC in the last 168 hours.   BNP Recent Labs Lab 04/24/17 1538  BNP 562.9*     DDimer No results for input(s): DDIMER in the last 168 hours.   Radiology    Dg Chest Port 1 View  Result Date: 04/25/2017 CLINICAL DATA:  Shortness of breath chest radiograph 04/24/2017 EXAM: PORTABLE CHEST 1 VIEW COMPARISON:  None. FINDINGS: Unchanged cardiomegaly with mild interstitial pulmonary edema. No pleural effusion. No pneumothorax. No focal consolidation. IMPRESSION: Cardiomegaly and mild interstitial pulmonary edema. Electronically Signed   By: Deatra Robinson M.D.   On: 04/25/2017 05:36   Dg Chest Port 1 View  Result Date: 04/24/2017 CLINICAL DATA:  Low hemoglobin and shortness of breath. EXAM: PORTABLE CHEST 1 VIEW COMPARISON:  None. FINDINGS: Interstitial coarsening that could be congestive or less likely  bronchitic. No Kerley lines. Cardiomegaly and mild aortic tortuosity. No effusion or pneumothorax. Presumed hyperinflation with flat diaphragm. IMPRESSION: 1. Interstitial coarsening, favored congestive rather than bronchitic. 2. Cardiomegaly. Electronically Signed   By: Marnee Spring M.D.   On: 04/24/2017 16:25    Cardiac Studies   Pending  Patient Profile     76 y.o. female with non-obstructive CAD by 2016 cath (in setting of elevated troponin felt due to diastolic CHF), IDDM, HTN, HLD, anemia, obesity, moderate aortic stenosis (? Listed in PMH but no prior echo - prior murmur per pt) who presented to [facility] with complaints of fluttering in her chest x 1 month, increasing DOE, right shoulder/neck pain, recently dx OP PNA (cough/wheeze/fever), LEE, weight gain (had recently made efforts to lose), and black stool. Found to have GIB, ABL anemia and new onset of atrial fib.  Assessment & Plan    1. Acute blood loss anemia with suspected GIB - per IM, GI. Pending further studies this admission.  2. Newly recognized atrial fib RVR - duration unknown. Rate much improved s/p blood transfusion and diuresis. Cannot anticoagulate or pursue restoration of NSR until the specifics of #1 have resolved. CHADSVASC 4 thus would recommend anticoag when cleared by GI.   3. Suspected acute diastolic CHF - may be due to AF RVR and GIB with high output state. She also has h/o murmur and moderate AS listed in chart. Repeat echo pending. She does still continue to have some wheezing. BP has been on the lower side. I will reduce today's Lasix to 20mg  IV today. Would recommend primary team consider titration of pulm regimen for wheezing.  4. Elevated troponin - suspect demand ischemia. F/u EF by echo. No ASA at present time due to #1.  5. NSVT versus AF with abberrancy - K 3.7, will replet with X1 and start KCl daily. Continue BB. Add Mg level.   Signed, Laurann Montana, PA-C  04/26/2017, 8:54 AM

## 2017-04-26 NOTE — Progress Notes (Signed)
Patient on room air washing up at the sink when RT entered the room. Sats were 95% on room air. Placed patient back on 2L [place] after treatment was finished.

## 2017-04-26 NOTE — Progress Notes (Signed)
Patient refused Lantus.  Patient stated she does not take the Lantus anymore.  Patient also stated she does not take 50 mg of Lopressor that she has been taking half.  Dr. Darnelle Catalan notified via text page.

## 2017-04-26 NOTE — Progress Notes (Signed)
  Echocardiogram 2D Echocardiogram has been performed.  Delcie Roch 04/26/2017, 1:49 PM

## 2017-04-27 ENCOUNTER — Inpatient Hospital Stay (HOSPITAL_COMMUNITY): Payer: Medicare Other

## 2017-04-27 DIAGNOSIS — Z515 Encounter for palliative care: Secondary | ICD-10-CM

## 2017-04-27 DIAGNOSIS — D62 Acute posthemorrhagic anemia: Secondary | ICD-10-CM

## 2017-04-27 LAB — BASIC METABOLIC PANEL
Anion gap: 9 (ref 5–15)
BUN: 17 mg/dL (ref 6–20)
CO2: 30 mmol/L (ref 22–32)
Calcium: 8.8 mg/dL — ABNORMAL LOW (ref 8.9–10.3)
Chloride: 99 mmol/L — ABNORMAL LOW (ref 101–111)
Creatinine, Ser: 0.78 mg/dL (ref 0.44–1.00)
GFR calc Af Amer: >60 mL/min (ref >60)
GFR calc non Af Amer: >60 mL/min (ref >60)
Glucose, Bld: 137 mg/dL — ABNORMAL HIGH (ref 65–99)
Potassium: 3.8 mmol/L (ref 3.5–5.1)
Sodium: 138 mmol/L (ref 135–145)

## 2017-04-27 LAB — CBC
HCT: 28.9 % — ABNORMAL LOW (ref 36.0–46.0)
Hemoglobin: 8.5 g/dL — ABNORMAL LOW (ref 12.0–15.0)
MCH: 27.6 pg (ref 26.0–34.0)
MCHC: 29.4 g/dL — ABNORMAL LOW (ref 30.0–36.0)
MCV: 93.8 fL (ref 78.0–100.0)
Platelets: 245 10*3/uL (ref 150–400)
RBC: 3.08 MIL/uL — ABNORMAL LOW (ref 3.87–5.11)
RDW: 18.2 % — ABNORMAL HIGH (ref 11.5–15.5)
WBC: 7.6 10*3/uL (ref 4.0–10.5)

## 2017-04-27 LAB — GLUCOSE, CAPILLARY
Glucose-Capillary: 103 mg/dL — ABNORMAL HIGH (ref 65–99)
Glucose-Capillary: 119 mg/dL — ABNORMAL HIGH (ref 65–99)
Glucose-Capillary: 120 mg/dL — ABNORMAL HIGH (ref 65–99)
Glucose-Capillary: 124 mg/dL — ABNORMAL HIGH (ref 65–99)
Glucose-Capillary: 130 mg/dL — ABNORMAL HIGH (ref 65–99)
Glucose-Capillary: 133 mg/dL — ABNORMAL HIGH (ref 65–99)

## 2017-04-27 LAB — MAGNESIUM: Magnesium: 2.2 mg/dL (ref 1.7–2.4)

## 2017-04-27 MED ORDER — IOPAMIDOL (ISOVUE-300) INJECTION 61%: 30.0000 mL | Freq: Once | INTRAVENOUS | Status: DC | PRN

## 2017-04-27 MED ORDER — IOPAMIDOL (ISOVUE-300) INJECTION 61%
INTRAVENOUS | Status: AC
Start: 2017-04-27 — End: 2017-04-27
  Administered 2017-04-27: 100 mL via INTRAVENOUS
  Filled 2017-04-27: qty 100

## 2017-04-27 MED ORDER — METOPROLOL TARTRATE 25 MG PO TABS
25.0000 mg | ORAL_TABLET | Freq: Four times a day (QID) | ORAL | Status: DC
  Administered 2017-04-27 – 2017-04-29 (×9): 25 mg via ORAL
  Filled 2017-04-27 (×9): qty 1

## 2017-04-27 NOTE — Progress Notes (Signed)
Dr. Theodore Demark, cardiology, notified via telephone regarding Afib RVR with HR 140's this morning.  Patient is now Afib HR 107.  No new orders received. Will continue to monitor.

## 2017-04-27 NOTE — Progress Notes (Signed)
Progress Note  Patient Name: Desirie Bosscher Date of Encounter: 04/27/2017  Primary Cardiologist:   Dr. Eden Emms  Subjective   Atrial fib with rapid rate this AM.    She is in no distress and is not having chest pain or SOB.    Inpatient Medications    Scheduled Meds: . allopurinol  100 mg Oral Daily  . insulin aspart  0-9 Units Subcutaneous Q4H  . levalbuterol  0.63 mg Nebulization Q6H  . metoprolol succinate  25 mg Oral Daily  . multivitamin with minerals  1 tablet Oral Daily  . pantoprazole  40 mg Intravenous Q12H  . potassium chloride  20 mEq Oral Daily   Continuous Infusions:  PRN Meds: acetaminophen, ondansetron **OR** ondansetron (ZOFRAN) IV   Vital Signs    Vitals:   04/27/17 0430 04/27/17 0755 04/27/17 0900 04/27/17 1026  BP: 106/64 (!) 147/88  137/69  Pulse: 91 79  98  Resp: 15 18  20   Temp: 97.7 F (36.5 C) 97.7 F (36.5 C)  98.3 F (36.8 C)  TempSrc: Oral Oral  Oral  SpO2: 100% 98% 94% 96%  Weight:      Height:        Intake/Output Summary (Last 24 hours) at 04/27/17 1247 Last data filed at 04/27/17 0900  Gross per 24 hour  Intake              600 ml  Output                0 ml  Net              600 ml   Filed Weights   04/24/17 1522 04/25/17 2014 04/26/17 2007  Weight: 196 lb (88.9 kg) 195 lb 8.8 oz (88.7 kg) 190 lb 6.4 oz (86.4 kg)    Telemetry    Atrial fib with RVR - Personally Reviewed  ECG     - Personally Reviewed  Physical Exam   GEN: No acute distress.   Neck: No  JVD Cardiac: Irregular RR, 2/6 apical mid to late peaking systolic murmur, no diastolic murmurs, rubs, or gallops.  Respiratory: Clear  to auscultation bilaterally. GI: Soft, nontender, non-distended  MS: No  edema; No deformity. Neuro:  Nonfocal  Psych: Normal affect   Labs    Chemistry Recent Labs Lab 04/24/17 1538 04/25/17 0717 04/26/17 0305 04/27/17 0818  NA 136 138 139 138  K 4.1 4.1 3.7 3.8  CL 99* 103 99* 99*  CO2 24 26 29 30   GLUCOSE 108* 113*  114* 137*  BUN 38* 24* 18 17  CREATININE 0.83 0.80 0.87 0.78  CALCIUM 8.5* 8.6* 8.8* 8.8*  PROT 6.8  --   --   --   ALBUMIN 3.4*  --   --   --   AST 47*  --   --   --   ALT 41  --   --   --   ALKPHOS 102  --   --   --   BILITOT 0.3  --   --   --   GFRNONAA >60 >60 >60 >60  GFRAA >60 >60 >60 >60  ANIONGAP 13 9 11 9      Hematology Recent Labs Lab 04/25/17 0717 04/26/17 0305 04/26/17 1615 04/27/17 0818  WBC 8.9 8.9  --  7.6  RBC 2.51* 2.96*  2.96*  --  3.08*  HGB 7.1* 8.2* 8.7* 8.5*  HCT 24.5* 28.1* 29.0* 28.9*  MCV 97.6 94.9  --  93.8  MCH 28.3 27.7  --  27.6  MCHC 29.0* 29.2*  --  29.4*  RDW 17.9* 18.7*  --  18.2*  PLT 246 243  --  245    Cardiac Enzymes Recent Labs Lab 04/24/17 1538 04/24/17 2328 04/25/17 0717 04/25/17 1153  TROPONINI 0.08* 0.09* 0.09* 0.08*   No results for input(s): TROPIPOC in the last 168 hours.   BNP Recent Labs Lab 04/24/17 1538 04/26/17 1615  BNP 562.9* 723.0*     DDimer No results for input(s): DDIMER in the last 168 hours.   Radiology    No results found.  Cardiac Studies   ECHOCARDIOGRAM:  04/26/17  Study Conclusions  - Left ventricle: The cavity size was normal. There was mild   concentric hypertrophy. Systolic function was vigorous. The   estimated ejection fraction was in the range of 65% to 70%. Wall   motion was normal; there were no regional wall motion   abnormalities. The study was not technically sufficient to allow   evaluation of LV diastolic dysfunction due to atrial   fibrillation. - Aortic valve: Valve mobility was restricted. There was severe   stenosis. There was mild regurgitation. Mean gradient (S): 49 mm   Hg. Peak gradient (S): 77 mm Hg. Valve area (VTI): 0.65 cm^2.   Valve area (Vmax): 0.65 cm^2. Valve area (Vmean): 0.64 cm^2. - Aortic root: The aortic root was normal in size. - Mitral valve: Calcified annulus. Severely thickened leaflets .   There was mild regurgitation. - Left atrium: The  atrium was moderately dilated. - Right ventricle: The cavity size was normal. Wall thickness was   normal. Systolic function was normal. - Right atrium: The atrium was normal in size. - Tricuspid valve: There was mild regurgitation. - Pulmonary arteries: Systolic pressure was within the normal   range. - Inferior vena cava: The vessel was normal in size. - Pericardium, extracardiac: There was no pericardial effusion.  Impressions:  - Severe aortic stenosis, referral to CT surgery is recommended.   Patient Profile     76 y.o. female with a hx of non-obs CAD 2016 cath, IDDM, HTN, HLD, anemia, D-CHF, who is being seen for the evaluation of atrial fib and elevated troponin at the request of Dr Darnelle Catalan.  Assessment & Plan    ACUTE BLOOD LOSS ANEMIA:   She would like a second opinion form a gastroenterologist.  I have suggest that she proceed with upper and lower GI evaluation so that we can have a better understanding of risk benefit of anticoagulation.  ATRIAL FIB WITH RVR:  I will change to metoprolol 25 IR qid for now to better control her rate.  I will consolidate meds later.    ACUTE ON CHRONIC DIASTOLIC DYSFUNCTION:  Seems to be euvolemic.     ELEVATED TROPONIN:    AS:  Severe by echo.    Signed, Rollene Rotunda, MD  04/27/2017, 12:47 PM

## 2017-04-27 NOTE — Progress Notes (Signed)
Progress Note    Taylor Jenkins  MWU:[id number] DOB: Apr 06, 1941  DOA: 04/24/2017 PCP: No primary care provider on file.    Brief Narrative:   Chief complaint: Follow-up symptomatic anemia  Medical records reviewed and are as summarized below:  Taylor Jenkins is an 76 y.o. female with a PMH of nonobstructive CAD status post cardiac catheterization 2016, insulin-dependent diabetes, hypertension, hyperlipidemia, moderate aortic stenosis, diastolic CHF and chronic anemia who was admitted 04/24/17 for generalized weakness, lack of energy, weight gain, fever, and recent diagnosis of pneumonia by PCP. Upon initial evaluation in the ED, she was found to have a hemoglobin of 6.6 mg/dL.  Assessment/Plan:   Principal Problem:   Likely chronic blood loss, symptomatic anemia from GI losses/melena Hemoglobin 8.7 last evening after 1 unit of PRBCs and approximately 110 mL of a second unit. Was also given IV iron 04/25/17.  GI consulted and recommends further endoscopic evaluation, however the patient's daughters are nurses and they are reluctant to have her put under conscious sedation and want assurance from the cardiologist that it is safe to do this.Lengthy discussion held with the patient herself also wishes to avoid this and simply wants and on invasive workup and symptomatic treatment. May benefit from a discussion with the palliative care team. Continue PPI. Will get a CT of the abdomen and pelvis to see if this reveals any obvious source.  Active Problems:   Demand ischemia (HCC)/severe aortic stenosis Troponin elevation occurred in the setting of afib w/ RVR and profound anemia. This is most likely demand ischemia. H/O mild diffuse non-obstructive CAD.  Evaluated by cardiology with recommendations to treat medically. 2-D echo performed 04/26/17: EF 65-70 percent with severe aortic stenosis. May need referral to CVTS if the family willing to consider definitive treatment. Would need to be on  anticoagulants to consider definitive treatment, however, and with her current refusal of endoscopic evaluation, cannot safely be anticoagulated. Metoprolol adjusted to achieve better rate control, as she had an episode of RVR again this morning with heart rates into the 140s.    Acute diastolic CHF (congestive heart failure) (HCC) Likely from rapid afib. Heart rate has now been controlled. Repeat EKG 04/26/17 personally reviewed and showed A. fib with controlled ventricular rate at 87 bpm, no ischemic changes. Lasix dose adjusted by cardiology. Heart rate 77-91, but had an episode of RVR this morning with rate into the 140s.    IDDM (insulin dependent diabetes mellitus) (HCC) Currently being managed by Lantus 10 units and insulin sensitive SSI Q 4 hours. CBGs 124-170.    HTN (hypertension) Continue metoprolol.    New onset a-fib (HCC) CHADS2VASC=6 (female, age x 1, HTN, DM, CHF, CAD). No prior history of afib, and may simply be triggered by severe anemia. Continue beta blocker for rate control. Not a candidate for anti-coagulation in the setting of GI bleeding.    Obesity Body mass index is 38.46 kg/m.   Family Communication/Anticipated D/C date and plan/Code Status   DVT prophylaxis: SCDs ordered. Code Status: Full Code.  Family Communication: No family present today, children updated yesterday. Disposition Plan: Home when hgb stable.   Medical Consultants:    Gastroenterology  Cardiology   Anti-Infectives:    None  Subjective:   The patient reports shortness of breath, frothy sputum. No dizziness. No BMs since admission.  Objective:    Vitals:   04/26/17 2007 04/27/17 0229 04/27/17 0430 04/27/17 0755  BP: (!) 126/55  106/64 (!) 147/88  Pulse: 77  91 79  Resp: 20  15 18   Temp: 98.4 F (36.9 C)  97.7 F (36.5 C) 97.7 F (36.5 C)  TempSrc: Oral  Oral Oral  SpO2: 97% 97% 100% 98%  Weight: 86.4 kg (190 lb 6.4 oz)     Height:        Intake/Output Summary  (Last 24 hours) at 04/27/17 0819 Last data filed at 04/27/17 0600  Gross per 24 hour  Intake              360 ml  Output                0 ml  Net              360 ml   Filed Weights   04/24/17 1522 04/25/17 2014 04/26/17 2007  Weight: 88.9 kg (196 lb) 88.7 kg (195 lb 8.8 oz) 86.4 kg (190 lb 6.4 oz)    Exam: General: No acute distress.Sitting up in chair. Cardiovascular: Heart sounds are irregularly irregular. No gallops or rubs. 3/6 systolic ejection murmur. No JVD. Lungs: Mildly coarse to auscultation bilaterally with good air movement. No rales, rhonchi or wheezes. Abdomen: Soft, nontender, nondistended with normal active bowel sounds. No masses. No hepatosplenomegaly. Neurological: Alert and oriented 3. Nonfocal. Skin: Warm and dry. No rashes or lesions. Extremities: No clubbing or cyanosis. No edema. Pedal pulses 2+. Psychiatric: Mood and affect are normal. Insight and judgment are fair.   Data Reviewed:   I have personally reviewed following labs and imaging studies:  Labs: Labs show the following: Sodium 138, potassium 3.8, chloride 99, bicarbonate 30, BUN 17, creatinine 0.78, calcium 8.8. WBC 7.6, hemoglobin 8.2--->8.7 ---> 8.5, hematocrit 28.9, platelets 245. Magnesium 2.2.  LFTs WNL with the exception of AST 47 and albumin 3.4. Troponin: 0.08, 0.09, 0.09, 0.08.  TSH 1.413.  Vitamin B-12 3970, folate 77.4, ferritin 14, TIBC 435, iron 321, reticulocyte count 7.9.   Microbiology 04/24/17: Blood culture left hand: Negative to date.  04/24/17: Blood culture left antecubital: Negative to date.  Procedures and diagnostic studies:  04/24/17 Dg Chest Port 1 View:  1. Interstitial coarsening, favored congestive rather than bronchitic. 2. Cardiomegaly.   04/25/2017 CXR: My independent review of the image shows: Mild edema as pictured. Significant cardiomegaly.    04/26/17 2-D echocardiogram:  Study Conclusions  - Left ventricle: The cavity size was normal. There was  mild   concentric hypertrophy. Systolic function was vigorous. The   estimated ejection fraction was in the range of 65% to 70%. Wall   motion was normal; there were no regional wall motion   abnormalities. The study was not technically sufficient to allow   evaluation of LV diastolic dysfunction due to atrial   fibrillation. - Aortic valve: Valve mobility was restricted. There was severe   stenosis. There was mild regurgitation. Mean gradient (S): 49 mm   Hg. Peak gradient (S): 77 mm Hg. Valve area (VTI): 0.65 cm^2.   Valve area (Vmax): 0.65 cm^2. Valve area (Vmean): 0.64 cm^2. - Aortic root: The aortic root was normal in size. - Mitral valve: Calcified annulus. Severely thickened leaflets .   There was mild regurgitation. - Left atrium: The atrium was moderately dilated. - Right ventricle: The cavity size was normal. Wall thickness was   normal. Systolic function was normal. - Right atrium: The atrium was normal in size. - Tricuspid valve: There was mild regurgitation. - Pulmonary arteries: Systolic pressure was within the normal   range. - Inferior vena  cava: The vessel was normal in size. - Pericardium, extracardiac: There was no pericardial effusion.  Impressions:  - Severe aortic stenosis, referral to CT surgery is recommended.  Medications:   . allopurinol  100 mg Oral Daily  . insulin aspart  0-9 Units Subcutaneous Q4H  . levalbuterol  0.63 mg Nebulization Q6H  . metoprolol succinate  25 mg Oral Daily  . multivitamin with minerals  1 tablet Oral Daily  . pantoprazole  40 mg Intravenous Q12H  . potassium chloride  20 mEq Oral Daily   Continuous Infusions:    LOS: 3 days   Greater than 35 minutes spent with this patient with greater than 50% of the time in face-to-face contact providing extensive explanations regarding diagnostic testing, diagnostic impression, and plan of care to both the patient.  Zykera Abella  [facility] Pager [phone]. If  unable to reach me by pager, please call my cell phone at [phone].  *Please refer to [email or web address], password [email or web address] to get updated schedule on who will round on this patient, as hospitalists switch teams weekly. If 7PM-7AM, please contact night-coverage at [email or web address], password [email or web address] for any overnight needs.  04/27/2017, 8:19 AM

## 2017-04-27 NOTE — Consult Note (Signed)
Consultation Note Date: 04/27/2017   Patient Name: Taylor Jenkins  DOB: 1941-01-15  MRN: [id number]  Age / Sex: 76 y.o., female  PCP: No primary care provider on file. Referring Physician: Rama, Venetia Maxon, MD  Reason for Consultation: Establishing goals of care and Psychosocial/spiritual support  HPI/Patient Profile: 76 y.o. female  with past medical history of coronary artery disease status post cardcatheter in 2016, insulin-dependent diabetes, hypertension, hyperlipidemia, moderate aortic stenosis, diastolic heart failure, chronic anemia admitted on 04/24/2017 with generalized weakness, lack of energy,. In the emergency room, she was found to have a hemoglobin of 6.6. She has been transfused 1 unit of packed red blood cells and her hemoglobin is 8.7.  Consult ordered for goals of care. Met with patient and her daughter, Rojelio Brenner. Patient shares with me that in May 2018,  she dropped her hemoglobin to 6.2. At that time she did defer colonoscopy and endoscopy and was able to manage this condition with watchful waiting. She did not receive a transfusion then. Per the daughter, when she was rechecked in the doctor's office approximately 2 weeks later her hemoglobin had returned to 11.9.   Clinical Assessment and Goals of Care: Patient is alert and oriented 4. She is able to articulate a good understanding of her current clinical condition as follows: Repeat GI bleeding episode now requiring a colonoscopy and endoscopy; new atrial fib with RVR that ideally would be treated with anticoagulation but because of her GI bleeding she cannot have that at this time, worsening aortic stenosis.  Patient is decisional and is able to make her own healthcare decisions. In the event that she were unable to do this HER-2 daughters, Bonney Roussel, and Cindy Blitzer, would be her healthcare proxies    SUMMARY OF RECOMMENDATIONS   Full  code Patient willing to move forward with colonoscopy and endoscopy. Discussed specifics of pt's concerns with Dr. Rockne Menghini who is going to facilitate further workup Patient going for CT of the abdomen tonight Code Status/Advance Care Planning:  Full code   Palliative Prophylaxis:   Aspiration, Bowel Regimen, Frequent Pain Assessment, Oral Care and Turn Reposition  Additional Recommendations (Limitations, Scope, Preferences):  Full Scope Treatment  Psycho-social/Spiritual:   Desire for further Chaplaincy support:no   Prognosis:   Unable to determine  Discharge Planning: To Be Determined      Primary Diagnoses: Present on Admission: . Acute blood loss anemia . Melena . Acute diastolic CHF (congestive heart failure) ([place]) . Demand ischemia ([place]) . HTN (hypertension) . New onset a-fib ([place]) . Symptomatic anemia . New onset atrial fibrillation ([place])   I have reviewed the medical record, interviewed the patient and family, and examined the patient. The following aspects are pertinent.  Past Medical History:  Diagnosis Date  . Anemia   . CAD (coronary artery disease) 2016   non-obstructive at cath  . Diabetes mellitus without complication ([place])    type 2  . Elevated troponin 2016   secondary to demand ischemia from CHF  . Hyperlipidemia associated with type 2  diabetes mellitus ([place])   . Hypertension   . Moderate aortic stenosis    Social History   Social History  . Marital status: Single    Spouse name: N/A  . Number of children: N/A  . Years of education: N/A   Occupational History  . Retired    Social History Main Topics  . Smoking status: Never Smoker  . Smokeless tobacco: Never Used  . Alcohol use No  . Drug use: No  . Sexual activity: Not Asked   Other Topics Concern  . None   Social History Narrative   Pt lives alone in [place].    Family History  Problem Relation Age of Onset  . Heart attack Mother 6  . Heart attack Brother 47    Scheduled Meds: . allopurinol  100 mg Oral Daily  . insulin aspart  0-9 Units Subcutaneous Q4H  . levalbuterol  0.63 mg Nebulization Q6H  . metoprolol tartrate  25 mg Oral QID  . multivitamin with minerals  1 tablet Oral Daily  . pantoprazole  40 mg Intravenous Q12H  . potassium chloride  20 mEq Oral Daily   Continuous Infusions: PRN Meds:.acetaminophen, iopamidol, ondansetron **OR** ondansetron (ZOFRAN) IV Medications Prior to Admission:  Prior to Admission medications   Medication Sig Start Date End Date Taking? Authorizing Provider  acetaminophen (TYLENOL) 325 MG tablet Take 2 tablets (650 mg total) by mouth every 4 (four) hours as needed for headache or mild pain. 09/28/14  Yes Kilroy, Luke K, PA-C  allopurinol (ZYLOPRIM) 100 MG tablet Take 100 mg by mouth daily. 01/29/17  Yes [provider]  aspirin EC 81 MG EC tablet Take 1 tablet (81 mg total) by mouth daily. 09/28/14  Yes Kilroy, Luke K, PA-C  insulin NPH Human (HUMULIN N,NOVOLIN N) 100 UNIT/ML injection Inject 18-25 Units into the skin See admin instructions. Use 25 units every morning and use 18 units at night   Yes [provider]  levofloxacin (LEVAQUIN) 750 MG tablet Take 750 mg by mouth daily. 04/20/17  Yes [provider]  metFORMIN (GLUCOPHAGE) 1000 MG tablet Take 1,000 mg by mouth 2 (two) times daily with a meal.   Yes [provider]  metoprolol succinate (TOPROL-XL) 50 MG 24 hr tablet Take 50 mg by mouth daily. 01/29/17  Yes [provider]  Multiple Vitamin (MULTIVITAMIN) tablet Take 1 tablet by mouth daily.   Yes [provider]  nitroGLYCERIN (NITROSTAT) 0.4 MG SL tablet Place 1 tablet (0.4 mg total) under the tongue every 5 (five) minutes x 3 doses as needed for chest pain (or severe SOB). 09/28/14  Yes Kilroy, Luke K, PA-C  omega-3 acid ethyl esters (LOVAZA) 1 G capsule Take 1-2 g by mouth See admin instructions. Take 2 capsules every morning and take 1 capsule every  evening   Yes [provider]  potassium chloride (MICRO-K) 10 MEQ CR capsule Take 10 mEq by mouth daily.  08/01/15  Yes [provider]  torsemide (DEMADEX) 10 MG tablet Take 5 mg by mouth daily.  08/01/15  Yes [provider]   Allergies  Allergen Reactions  . Penicillins Anaphylaxis  . Statins Other (See Comments)    Myalgias   . Codeine   . Prednisone Other (See Comments)    psychosis   . Lidocaine Palpitations  . Sulfa Antibiotics Rash   Review of Systems  Constitutional: Positive for activity change and fatigue.  HENT: Negative.   Eyes: Negative.   Respiratory: Positive for shortness  of breath.   Cardiovascular: Positive for palpitations.  Gastrointestinal: Positive for blood in stool.  Endocrine: Negative.   Genitourinary: Negative.   Musculoskeletal: Positive for neck stiffness.  Skin: Negative.   Allergic/Immunologic: Negative.   Neurological: Positive for weakness and light-headedness.  Hematological: Negative.   Psychiatric/Behavioral: The patient is nervous/anxious.     Physical Exam  Constitutional: She is oriented to person, place, and time. She appears well-developed and well-nourished.  Older female, alert and oriented, in no acute distress  Neck: Normal range of motion.  Pulmonary/Chest: Effort normal.  Musculoskeletal: Normal range of motion.  Neurological: She is alert and oriented to person, place, and time.  Skin: Skin is warm and dry. There is pallor.  Psychiatric: She has a normal mood and affect. Her behavior is normal. Judgment and thought content normal.  Nursing note and vitals reviewed.   Vital Signs: BP 130/68 (BP Location: Right Arm)   Pulse 75   Temp 98.6 F (37 C) (Oral)   Resp 20   Ht '4\' 11"'$  (1.499 m)   Wt 86.4 kg (190 lb 6.4 oz)   SpO2 95%   BMI 38.46 kg/m  Pain Assessment: No/denies pain   Pain Score: 0-No pain   SpO2: SpO2: 95 % O2 Device:SpO2: 95 % O2 Flow Rate: .O2 Flow Rate (L/min): 2  L/min  IO: Intake/output summary:  Intake/Output Summary (Last 24 hours) at 04/27/17 1808 Last data filed at 04/27/17 0900  Gross per 24 hour  Intake              360 ml  Output                0 ml  Net              360 ml    LBM: Last BM Date: 04/26/17 Baseline Weight: Weight: 88.9 kg (196 lb) Most recent weight: Weight: 86.4 kg (190 lb 6.4 oz)     Palliative Assessment/Data:   Flowsheet Rows     Most Recent Value  Intake Tab  Referral Department  Hospitalist  Unit at Time of Referral  Med/Surg Unit  Palliative Care Primary Diagnosis  Other (Comment)  Date Notified  04/27/17  Palliative Care Type  New Palliative care  Date of Admission  04/24/17  Date first seen by Palliative Care  04/27/17  # of days Palliative referral response time  0 Day(s)  # of days IP prior to Palliative referral  3  Clinical Assessment  Palliative Performance Scale Score  50%  Pain Max last 24 hours  0  Pain Min Last 24 hours  0  Dyspnea Max Last 24 Hours  0  Dyspnea Min Last 24 hours  0  Nausea Max Last 24 Hours  0  Nausea Min Last 24 Hours  0  Anxiety Max Last 24 Hours  0  Anxiety Min Last 24 Hours  0  Other Max Last 24 Hours  0  Psychosocial & Spiritual Assessment  Palliative Care Outcomes  Patient/Family meeting held?  Yes  Who was at the meeting?  pt and daughter [facility]  Palliative Care Outcomes  Clarified goals of care, Provided psychosocial or spiritual support      Time In: 1700 Time Out: 1810 Time Total: 70 min Greater than 50%  of this time was spent counseling and coordinating care related to the above assessment and plan. Staffed with Dr. Rockne Menghini  Signed by: Dory Horn, NP   Please contact Palliative Medicine Team phone at  [phone] for questions and concerns.  For individual provider: See Shea Evans

## 2017-04-27 NOTE — Progress Notes (Signed)
Received notification from CMT. Patient in Afib RVR HR 140's.  Dr. Darnelle Catalan notified via text page.

## 2017-04-28 LAB — BASIC METABOLIC PANEL
Anion gap: 7 (ref 5–15)
BUN: 14 mg/dL (ref 6–20)
CO2: 29 mmol/L (ref 22–32)
Calcium: 8.8 mg/dL — ABNORMAL LOW (ref 8.9–10.3)
Chloride: 103 mmol/L (ref 101–111)
Creatinine, Ser: 0.76 mg/dL (ref 0.44–1.00)
GFR calc Af Amer: >60 mL/min (ref >60)
GFR calc non Af Amer: >60 mL/min (ref >60)
Glucose, Bld: 142 mg/dL — ABNORMAL HIGH (ref 65–99)
Potassium: 4.3 mmol/L (ref 3.5–5.1)
Sodium: 139 mmol/L (ref 135–145)

## 2017-04-28 LAB — CBC
HCT: 29.5 % — ABNORMAL LOW (ref 36.0–46.0)
Hemoglobin: 8.5 g/dL — ABNORMAL LOW (ref 12.0–15.0)
MCH: 27.4 pg (ref 26.0–34.0)
MCHC: 28.8 g/dL — ABNORMAL LOW (ref 30.0–36.0)
MCV: 95.2 fL (ref 78.0–100.0)
Platelets: 255 10*3/uL (ref 150–400)
RBC: 3.1 MIL/uL — ABNORMAL LOW (ref 3.87–5.11)
RDW: 18.4 % — ABNORMAL HIGH (ref 11.5–15.5)
WBC: 9.3 10*3/uL (ref 4.0–10.5)

## 2017-04-28 LAB — GLUCOSE, CAPILLARY
Glucose-Capillary: 139 mg/dL — ABNORMAL HIGH (ref 65–99)
Glucose-Capillary: 124 mg/dL — ABNORMAL HIGH (ref 65–99)
Glucose-Capillary: 113 mg/dL — ABNORMAL HIGH (ref 65–99)
Glucose-Capillary: 166 mg/dL — ABNORMAL HIGH (ref 65–99)
Glucose-Capillary: 143 mg/dL — ABNORMAL HIGH (ref 65–99)
Glucose-Capillary: 110 mg/dL — ABNORMAL HIGH (ref 65–99)

## 2017-04-28 MED ORDER — LEVALBUTEROL HCL 0.63 MG/3ML IN NEBU
0.6300 mg | INHALATION_SOLUTION | Freq: Four times a day (QID) | RESPIRATORY_TRACT | Status: DC | PRN
  Administered 2017-04-29: 0.63 mg via RESPIRATORY_TRACT
  Filled 2017-04-28: qty 3

## 2017-04-28 MED ORDER — LORATADINE 10 MG PO TABS
10.0000 mg | ORAL_TABLET | Freq: Once | ORAL | Status: AC
  Administered 2017-04-28: 10 mg via ORAL
  Filled 2017-04-28: qty 1

## 2017-04-28 MED ORDER — FAMOTIDINE 20 MG PO TABS
20.0000 mg | ORAL_TABLET | Freq: Once | ORAL | Status: AC
  Administered 2017-04-28: 20 mg via ORAL
  Filled 2017-04-28: qty 1

## 2017-04-28 MED ORDER — LEVALBUTEROL HCL 0.63 MG/3ML IN NEBU
0.6300 mg | INHALATION_SOLUTION | Freq: Three times a day (TID) | RESPIRATORY_TRACT | Status: DC
  Administered 2017-04-28 (×2): 0.63 mg via RESPIRATORY_TRACT
  Filled 2017-04-28 (×3): qty 3

## 2017-04-28 NOTE — Progress Notes (Signed)
Patient complain of not feeling right, tingling & tightness in feet & hands, face and neck flushed.  Lungs clear no complaints of SOB.  Dr. Darnelle Catalan notified.  Pepcid & Claritin one time dose ordered.

## 2017-04-28 NOTE — Progress Notes (Signed)
Went by to see pt this am. She is feeling much better. requesting information on advanced directives, designation of HCPOA.  Brought MOST forms as well as advance directive packet to pt. Consult placed to Spiritual Care to notarize paperwork on 04/29/17  Palliative Care to sign off. Please feel free to reconsult should Ms. Andy have any new issues to arise. Thank you,  Eduard Roux, ANP

## 2017-04-28 NOTE — Progress Notes (Signed)
Progress Note    Taylor Jenkins  ZOX:[id number] DOB: 29-Jun-1941  DOA: 04/24/2017 PCP: No primary care provider on file.    Brief Narrative:   Chief complaint: Follow-up symptomatic anemia  Medical records reviewed and are as summarized below:  Taylor Jenkins is an 76 y.o. female with a PMH of nonobstructive CAD status post cardiac catheterization 2016, insulin-dependent diabetes, hypertension, hyperlipidemia, moderate aortic stenosis, diastolic CHF and chronic anemia who was admitted 04/24/17 for generalized weakness, lack of energy, weight gain, fever, and recent diagnosis of pneumonia by PCP. Upon initial evaluation in the ED, she was found to have a hemoglobin of 6.6 mg/dL.  Assessment/Plan:   Principal Problem:   Likely chronic blood loss, symptomatic anemia from GI losses/melena Hemoglobin 8.7 last evening after 1 unit of PRBCs and approximately 110 mL of a second unit. Was also given IV iron 04/25/17.  GI consulted and recommends further endoscopic evaluation, and the patient is now willing to have this done. Continue PPI. CT of the abdomen and pelvis did not reveal any colonic masses or suspicious wall thickening. Sigmoid diverticulosis that was mild and hepatic capsular nodularity were seen. Hemoglobin remains stable at 8.5 mg/dL Patient requests Dr. Loreta Ave.  I will attempt to facilitate over the next 24 hours.   Active Problems:   Demand ischemia (HCC)/severe aortic stenosis Troponin elevation occurred in the setting of afib w/ RVR and profound anemia. This is most likely demand ischemia. H/O mild diffuse non-obstructive CAD.  Evaluated by cardiology with recommendations to treat medically. 2-D echo performed 04/26/17: EF 65-70 percent with severe aortic stenosis. Will need referral to CVTS once endoscopic evaluation of GI bleeding performed. Good rate control on metoprolol.    Acute diastolic CHF (congestive heart failure) (HCC) Likely from rapid afib. Heart rate has now been  controlled. Repeat EKG 04/26/17 personally reviewed and showed A. fib with controlled ventricular rate at 87 bpm, no ischemic changes. Lasix dose adjusted by cardiology. Heart rate controlled in the 60s-70s.    IDDM (insulin dependent diabetes mellitus) (HCC) Currently being managed by Lantus 10 units and insulin sensitive SSI Q 4 hours. CBGs 113-139.    HTN (hypertension) Continue metoprolol.    New onset a-fib (HCC)/aortic atherosclerosis CHADS2VASC=6 (female, age x 1, HTN, DM, CHF, CAD). No prior history of afib, and may simply be triggered by severe anemia. Continue beta blocker for rate control. Not a candidate for anti-coagulation in the setting of GI bleeding.    Obesity Body mass index is 38.17 kg/m.   Family Communication/Anticipated D/C date and plan/Code Status   DVT prophylaxis: SCDs ordered. Code Status: Full Code.  Family Communication: No family present today, children updated yesterday. Disposition Plan: Home when hgb stable and GI work up completed.   Medical Consultants:    Gastroenterology  Cardiology   Anti-Infectives:    None  Subjective:   Says she is breathing much better and feels that the breathing treatments have "really helped". Still has a cough, but no longer productive. Energy level improved. Had a dark stool.  Objective:    Vitals:   04/27/17 1628 04/27/17 1921 04/27/17 2031 04/28/17 0511  BP: 130/68  131/77 116/60  Pulse: 75  79 62  Resp: 20  20 18   Temp: 98.6 F (37 C)  98.3 F (36.8 C) 98.3 F (36.8 C)  TempSrc: Oral  Oral Oral  SpO2: 95% 96% 94% 98%  Weight:   85.7 kg (189 lb)   Height:  Intake/Output Summary (Last 24 hours) at 04/28/17 0828 Last data filed at 04/28/17 0600  Gross per 24 hour  Intake              900 ml  Output                0 ml  Net              900 ml   Filed Weights   04/25/17 2014 04/26/17 2007 04/27/17 2031  Weight: 88.7 kg (195 lb 8.8 oz) 86.4 kg (190 lb 6.4 oz) 85.7 kg (189 lb)     Exam: General: No acute distress. Cardiovascular: Heart sounds are irregularly irregular. No gallops or rubs. 3/6 systolic ejection murmur. No JVD. Lungs: Mildly coarse, but improved. No rales, rhonchi or wheezes. Abdomen: Soft, nontender, nondistended with normal active bowel sounds. No masses. No hepatosplenomegaly. Skin: Warm and dry. No rashes or lesions. Extremities: No clubbing or cyanosis. No edema. Pedal pulses 2+.  Data Reviewed:   I have personally reviewed following labs and imaging studies:  Labs: Labs show the following: Sodium 139, potassium 4.3, chloride 103, bicarbonate 29, BUN 14, creatinine 0.76. WBC 9.3, hemoglobin 8.5, hematocrit 29.5, platelets 255.  LFTs WNL with the exception of AST 47 and albumin 3.4. Troponin: 0.08, 0.09, 0.09, 0.08.  TSH 1.413.  Vitamin B-12 3970, folate 77.4, ferritin 14, TIBC 435, iron 321, reticulocyte count 7.9.   Microbiology 04/24/17: Blood culture left hand: Negative to date.  04/24/17: Blood culture left antecubital: Negative to date.  Procedures and diagnostic studies:  04/24/17 Dg Chest Port 1 View:  1. Interstitial coarsening, favored congestive rather than bronchitic. 2. Cardiomegaly.   04/25/2017 CXR: My independent review of the image shows: Mild edema as pictured. Significant cardiomegaly.    04/26/17 2-D echocardiogram:  Study Conclusions  - Left ventricle: The cavity size was normal. There was mild   concentric hypertrophy. Systolic function was vigorous. The   estimated ejection fraction was in the range of 65% to 70%. Wall   motion was normal; there were no regional wall motion   abnormalities. The study was not technically sufficient to allow   evaluation of LV diastolic dysfunction due to atrial   fibrillation. - Aortic valve: Valve mobility was restricted. There was severe   stenosis. There was mild regurgitation. Mean gradient (S): 49 mm   Hg. Peak gradient (S): 77 mm Hg. Valve area (VTI): 0.65 cm^2.    Valve area (Vmax): 0.65 cm^2. Valve area (Vmean): 0.64 cm^2. - Aortic root: The aortic root was normal in size. - Mitral valve: Calcified annulus. Severely thickened leaflets .   There was mild regurgitation. - Left atrium: The atrium was moderately dilated. - Right ventricle: The cavity size was normal. Wall thickness was   normal. Systolic function was normal. - Right atrium: The atrium was normal in size. - Tricuspid valve: There was mild regurgitation. - Pulmonary arteries: Systolic pressure was within the normal   range. - Inferior vena cava: The vessel was normal in size. - Pericardium, extracardiac: There was no pericardial effusion.  Impressions:  - Severe aortic stenosis, referral to CT surgery is recommended.  Medications:   . allopurinol  100 mg Oral Daily  . insulin aspart  0-9 Units Subcutaneous Q4H  . levalbuterol  0.63 mg Nebulization TID  . metoprolol tartrate  25 mg Oral QID  . multivitamin with minerals  1 tablet Oral Daily  . pantoprazole  40 mg Intravenous Q12H  . potassium chloride  20 mEq Oral Daily   Continuous Infusions:    LOS: 4 days    Teighan Aubert  [facility] Pager [phone]. If unable to reach me by pager, please call my cell phone at [phone].  *Please refer to [email or web address], password [email or web address] to get updated schedule on who will round on this patient, as hospitalists switch teams weekly. If 7PM-7AM, please contact night-coverage at [email or web address], password [email or web address] for any overnight needs.  04/28/2017, 8:28 AM

## 2017-04-29 ENCOUNTER — Encounter (HOSPITAL_COMMUNITY): Payer: Self-pay | Admitting: Internal Medicine

## 2017-04-29 DIAGNOSIS — E669 Obesity, unspecified: Secondary | ICD-10-CM

## 2017-04-29 DIAGNOSIS — I350 Nonrheumatic aortic (valve) stenosis: Secondary | ICD-10-CM

## 2017-04-29 DIAGNOSIS — I700 Atherosclerosis of aorta: Secondary | ICD-10-CM

## 2017-04-29 HISTORY — DX: Atherosclerosis of aorta: I70.0

## 2017-04-29 LAB — CULTURE, BLOOD (ROUTINE X 2)
Culture: NO GROWTH
Culture: NO GROWTH
Special Requests: ADEQUATE
Special Requests: ADEQUATE

## 2017-04-29 LAB — GLUCOSE, CAPILLARY
Glucose-Capillary: 116 mg/dL — ABNORMAL HIGH (ref 65–99)
Glucose-Capillary: 122 mg/dL — ABNORMAL HIGH (ref 65–99)
Glucose-Capillary: 124 mg/dL — ABNORMAL HIGH (ref 65–99)
Glucose-Capillary: 134 mg/dL — ABNORMAL HIGH (ref 65–99)

## 2017-04-29 LAB — CBC
HCT: 32.4 % — ABNORMAL LOW (ref 36.0–46.0)
Hemoglobin: 9.4 g/dL — ABNORMAL LOW (ref 12.0–15.0)
MCH: 28 pg (ref 26.0–34.0)
MCHC: 29 g/dL — ABNORMAL LOW (ref 30.0–36.0)
MCV: 96.4 fL (ref 78.0–100.0)
Platelets: 286 10*3/uL (ref 150–400)
RBC: 3.36 MIL/uL — ABNORMAL LOW (ref 3.87–5.11)
RDW: 18.9 % — ABNORMAL HIGH (ref 11.5–15.5)
WBC: 9.3 10*3/uL (ref 4.0–10.5)

## 2017-04-29 LAB — BASIC METABOLIC PANEL
Anion gap: 10 (ref 5–15)
BUN: 15 mg/dL (ref 6–20)
CO2: 23 mmol/L (ref 22–32)
Calcium: 9.1 mg/dL (ref 8.9–10.3)
Chloride: 104 mmol/L (ref 101–111)
Creatinine, Ser: 0.81 mg/dL (ref 0.44–1.00)
GFR calc Af Amer: >60 mL/min (ref >60)
GFR calc non Af Amer: >60 mL/min (ref >60)
Glucose, Bld: 163 mg/dL — ABNORMAL HIGH (ref 65–99)
Potassium: 4.5 mmol/L (ref 3.5–5.1)
Sodium: 137 mmol/L (ref 135–145)

## 2017-04-29 MED ORDER — POTASSIUM CHLORIDE ER 10 MEQ PO CPCR: 20.0000 meq | ORAL_CAPSULE | Freq: Every day | ORAL | 2 refills | Status: DC

## 2017-04-29 MED ORDER — METOPROLOL TARTRATE 25 MG PO TABS: 25.0000 mg | ORAL_TABLET | Freq: Four times a day (QID) | ORAL | 2 refills | Status: DC

## 2017-04-29 MED ORDER — PANTOPRAZOLE SODIUM 40 MG PO TBEC: 40.0000 mg | DELAYED_RELEASE_TABLET | Freq: Every day | ORAL | 1 refills | Status: DC

## 2017-04-29 MED ORDER — LEVALBUTEROL TARTRATE 45 MCG/ACT IN AERO: 2.0000 | INHALATION_SPRAY | RESPIRATORY_TRACT | 2 refills | Status: DC | PRN

## 2017-04-29 NOTE — Discharge Instructions (Signed)
Atrial Fibrillation °Atrial fibrillation is a type of irregular or rapid heartbeat (arrhythmia). In atrial fibrillation, the heart quivers continuously in a chaotic pattern. This occurs when parts of the heart receive disorganized signals that make the heart unable to pump blood normally. This can increase the risk for stroke, heart failure, and other heart-related conditions. There are different types of atrial fibrillation, including: °· Paroxysmal atrial fibrillation. This type starts suddenly, and it usually stops on its own shortly after it starts. °· Persistent atrial fibrillation. This type often lasts longer than a week. It may stop on its own or with treatment. °· Long-lasting persistent atrial fibrillation. This type lasts longer than 12 months. °· Permanent atrial fibrillation. This type does not go away. ° °Talk with your health care provider to learn about the type of atrial fibrillation that you have. °What are the causes? °This condition is caused by some heart-related conditions or procedures, including: °· A heart attack. °· Coronary artery disease. °· Heart failure. °· Heart valve conditions. °· High blood pressure. °· Inflammation of the sac that surrounds the heart (pericarditis). °· Heart surgery. °· Certain heart rhythm disorders, such as Wolf-Parkinson-White syndrome. ° °Other causes include: °· Pneumonia. °· Obstructive sleep apnea. °· Blockage of an artery in the lungs (pulmonary embolism, or PE). °· Lung cancer. °· Chronic lung disease. °· Thyroid problems, especially if the thyroid is overactive (hyperthyroidism). °· Caffeine. °· Excessive alcohol use or illegal drug use. °· Use of some medicines, including certain decongestants and diet pills. ° °Sometimes, the cause cannot be found. °What increases the risk? °This condition is more likely to develop in: °· People who are older in age. °· People who smoke. °· People who have diabetes mellitus. °· People who are overweight  (obese). °· Athletes who exercise vigorously. ° °What are the signs or symptoms? °Symptoms of this condition include: °· A feeling that your heart is beating rapidly or irregularly. °· A feeling of discomfort or pain in your chest. °· Shortness of breath. °· Sudden light-headedness or weakness. °· Getting tired easily during exercise. ° °In some cases, there are no symptoms. °How is this diagnosed? °Your health care provider may be able to detect atrial fibrillation when taking your pulse. If detected, this condition may be diagnosed with: °· An electrocardiogram (ECG). °· A Holter monitor test that records your heartbeat patterns over a 24-hour period. °· Transthoracic echocardiogram (TTE) to evaluate how blood flows through your heart. °· Transesophageal echocardiogram (TEE) to view more detailed images of your heart. °· A stress test. °· Imaging tests, such as a CT scan or chest X-ray. °· Blood tests. ° °How is this treated? °The main goals of treatment are to prevent blood clots from forming and to keep your heart beating at a normal rate and rhythm. The type of treatment that you receive depends on many factors, such as your underlying medical conditions and how you feel when you are experiencing atrial fibrillation. °This condition may be treated with: °· Medicine to slow down the heart rate, bring the heart’s rhythm back to normal, or prevent clots from forming. °· Electrical cardioversion. This is a procedure that resets your heart’s rhythm by delivering a controlled, low-energy shock to the heart through your skin. °· Different types of ablation, such as catheter ablation, catheter ablation with pacemaker, or surgical ablation. These procedures destroy the heart tissues that send abnormal signals. When the pacemaker is used, it is placed under your skin to help your heart beat in   a regular rhythm. ° °Follow these instructions at home: °· Take over-the counter and prescription medicines only as told by your  health care provider. °· If your health care provider prescribed a blood-thinning medicine (anticoagulant), take it exactly as told. Taking too much blood-thinning medicine can cause bleeding. If you do not take enough blood-thinning medicine, you will not have the protection that you need against stroke and other problems. °· Do not use tobacco products, including cigarettes, chewing tobacco, and e-cigarettes. If you need help quitting, ask your health care provider. °· If you have obstructive sleep apnea, manage your condition as told by your health care provider. °· Do not drink alcohol. °· Do not drink beverages that contain caffeine, such as coffee, soda, and tea. °· Maintain a healthy weight. Do not use diet pills unless your health care provider approves. Diet pills may make heart problems worse. °· Follow diet instructions as told by your health care provider. °· Exercise regularly as told by your health care provider. °· Keep all follow-up visits as told by your health care provider. This is important. °How is this prevented? °· Avoid drinking beverages that contain caffeine or alcohol. °· Avoid certain medicines, especially medicines that are used for breathing problems. °· Avoid certain herbs and herbal medicines, such as those that contain ephedra or ginseng. °· Do not use illegal drugs, such as cocaine and amphetamines. °· Do not smoke. °· Manage your high blood pressure. °Contact a health care provider if: °· You notice a change in the rate, rhythm, or strength of your heartbeat. °· You are taking an anticoagulant and you notice increased bruising. °· You tire more easily when you exercise or exert yourself. °Get help right away if: °· You have chest pain, abdominal pain, sweating, or weakness. °· You feel nauseous. °· You notice blood in your vomit, bowel movement, or urine. °· You have shortness of breath. °· You suddenly have swollen feet and ankles. °· You feel dizzy. °· You have sudden weakness or  numbness of the face, arm, or leg, especially on one side of the body. °· You have trouble speaking, trouble understanding, or both (aphasia). °· Your face or your eyelid droops on one side. °These symptoms may represent a serious problem that is an emergency. Do not wait to see if the symptoms will go away. Get medical help right away. Call your local emergency services (911 in the [place] Do not drive yourself to the hospital. °This information is not intended to replace advice given to you by your health care provider. Make sure you discuss any questions you have with your health care provider. °Document Released: 08/20/2005 Document Revised: 12/28/2015 Document Reviewed: 12/15/2014 °[employer]vier Interactive Patient Education © 2017 [employer] ° ° °Aspirin and Your Heart °Aspirin is a medicine that affects the way blood clots. Aspirin can be used to help reduce the risk of blood clots, heart attacks, and other heart-related problems. °Should I take aspirin? °Your health care provider will help you determine whether it is safe and beneficial for you to take aspirin daily. Taking aspirin daily may be beneficial if you: °· Have had a heart attack or chest pain. °· Have undergone open heart surgery such as coronary artery bypass surgery (CABG). °· Have had coronary angioplasty. °· Have experienced a stroke or transient ischemic attack (TIA). °· Have peripheral vascular disease (PVD). °· Have chronic heart rhythm problems such as atrial fibrillation. ° °Are there any risks of taking aspirin daily? °Daily use of   aspirin can increase your risk of side effects. Some of these include: °· Bleeding. Bleeding problems can be minor or serious. An example of a minor problem is a cut that does not stop bleeding. An example of a more serious problem is stomach bleeding or bleeding into the brain. Your risk of bleeding is increased if you are also taking non-steroidal anti-inflammatory medicine (NSAIDs). °· Increased  bruising. °· Upset stomach. °· An allergic reaction. People who have nasal polyps have an increased risk of developing an aspirin allergy. ° °What are some guidelines I should follow when taking aspirin? °· Take aspirin only as directed by your health care provider. Make sure you understand how much you should take and what form you should take. The two forms of aspirin are: °? Non-enteric-coated. This type of aspirin does not have a coating and is absorbed quickly. Non-enteric-coated aspirin is usually recommended for people with chest pain. This type of aspirin also comes in a chewable form. °? Enteric-coated. This type of aspirin has a special coating that releases the medicine very slowly. Enteric-coated aspirin causes less stomach upset than non-enteric-coated aspirin. This type of aspirin should not be chewed or crushed. °· Drink alcohol in moderation. Drinking alcohol increases your risk of bleeding. °When should I seek medical care? °· You have unusual bleeding or bruising. °· You have stomach pain. °· You have an allergic reaction. Symptoms of an allergic reaction include: °? Hives. °? Itchy skin. °? Swelling of the lips, tongue, or face. °· You have ringing in your ears. °When should I seek immediate medical care? °· Your bowel movements are bloody, dark red, or black in color. °· You vomit or cough up blood. °· You have blood in your urine. °· You cough, wheeze, or feel short of breath. °If you have any of the following symptoms, this is an emergency. Do not wait to see if the pain will go away. Get medical help at once. Call your local emergency services (911 in the [place] Do not drive yourself to the hospital. °· You have severe chest pain, especially if the pain is crushing or pressure-like and spreads to the arms, back, neck, or jaw. °· You have stroke-like symptoms, such as: °? Loss of vision. °? Difficulty talking. °? Numbness or weakness on one side of your body. °? Numbness or weakness in your arm  or leg. °? Not thinking clearly or feeling confused. ° °This information is not intended to replace advice given to you by your health care provider. Make sure you discuss any questions you have with your health care provider. °Document Released: 08/02/2008 Document Revised: 12/28/2015 Document Reviewed: 11/25/2013 °Elsevier Interactive Patient Education © 2018 [employer] ° °

## 2017-04-29 NOTE — Discharge Summary (Signed)
Physician Discharge Summary  Annjeanette Pehl IRW:[id number] DOB: 02/12/1941 DOA: 04/24/2017  PCP: No primary care provider on file.  Admit date: 04/24/2017 Discharge date: 04/29/2017  Admitted From: Home Discharge disposition: Home   Recommendations for Outpatient Follow-Up:   1. Will F/U with Dr. Loreta Ave for an endoscopic evaluation as an outpatient. 2. Will F/U with [facility] cardiology for further treatment recommendations regarding AS and afib once endoscopic evaluation completed.   Discharge Diagnosis:   Principal Problem:   Acute blood loss anemia Active Problems:   Demand ischemia (HCC)   Acute diastolic CHF (congestive heart failure) (HCC)   IDDM (insulin dependent diabetes mellitus) (HCC)   HTN (hypertension)   Melena   New onset a-fib (HCC)   Symptomatic anemia   New onset atrial fibrillation (HCC)   Severe aortic stenosis   Palliative care by specialist   Aortic atherosclerosis   Obesity  Discharge Condition: Improved.  Diet recommendation: Low sodium, heart healthy.  Carbohydrate-modified.    History of Present Illness:   Taylor Jenkins is an 76 y.o. female with a PMH of nonobstructive CAD status post cardiac catheterization 2016, insulin-dependent diabetes, hypertension, hyperlipidemia, moderate aortic stenosis, diastolic CHF and chronic anemia who was admitted 04/24/17 for generalized weakness, lack of energy, weight gain, fever, and recent diagnosis of pneumonia by PCP. Upon initial evaluation in the ED, she was found to have a hemoglobin of 6.6 mg/dL.  Hospital Course by Problem:   Principal Problem:   Likely chronic blood loss, symptomatic anemia from GI losses/melena Status post 1 unit of PRBCs and approximately 110 mL of a second unit. Was also given IV iron 04/25/17.  GI consulted and recommends further endoscopic evaluation, but wants this done by Dr. Loreta Ave who is not available. CT of the abdomen and pelvis did not reveal any colonic masses or suspicious wall  thickening. Sigmoid diverticulosis that was mild and hepatic capsular nodularity were seen. D/C home on PPI. Hemoglobin remains stable at 9.4 mg/dL  Active Problems:   Demand ischemia (HCC)/severe aortic stenosis Troponin elevation occurred in the setting of afib w/ RVR and profound anemia. This is most likely demand ischemia. H/O mild diffuse non-obstructive CAD.  Evaluated by cardiology with recommendations to treat medically. 2-D echo performed 04/26/17: EF 65-70 percent with severe aortic stenosis. Will need referral to CVTS once endoscopic evaluation of GI bleeding performed. Good rate control on metoprolol.    Acute diastolic CHF (congestive heart failure) (HCC) Likely from rapid afib. Heart rate has now been controlled. Repeat EKG 04/26/17 personally reviewed and showed A. fib with controlled ventricular rate at 87 bpm, no ischemic changes. Heart rate controlled in the 60s-70s. CHF symptoms resolved. OK to resume Torsemide at discharge.    IDDM (insulin dependent diabetes mellitus) (HCC) Currently being managed by Lantus 10 units and insulin sensitive SSI Q 4 hours. CBGs 113-139. Resume home regimen at discharge.    HTN (hypertension) Continue metoprolol.    New onset a-fib (HCC)/aortic atherosclerosis CHADS2VASC=6 (female, age x 1, HTN, DM, CHF, CAD). No prior history of afib, and may simply be triggered by severe anemia. Continue beta blocker for rate control. Not a candidate for anti-coagulation in the setting of GI bleeding. Can be considered once endoscopic evaluation completed and source addressed.    Obesity Body mass index is 38.17 kg/m.   Medical Consultants:    Gastroenterology  Cardiology  Palliative Care   Discharge Exam:   Vitals:   04/29/17 0800 04/29/17 0900  BP: (!) 123/101 112/82  Pulse:  65 70  Resp: 16   Temp: 98 F (36.7 C)   SpO2:     Vitals:   04/28/17 2054 04/29/17 0505 04/29/17 0800 04/29/17 0900  BP: (!) 144/81 (!) 110/59 (!) 123/101  112/82  Pulse: 71 74 65 70  Resp: 16 18 16    Temp: 98.1 F (36.7 C) 97.9 F (36.6 C) 98 F (36.7 C)   TempSrc: Oral  Oral   SpO2: 99% 94%    Weight: 86.1 kg (189 lb 12.8 oz)     Height:        General exam: Appears calm and comfortable.  Respiratory system: Clear to auscultation. Respiratory effort normal. Cardiovascular system: HSIR. No JVD,  rubs, gallops or clicks.III/VI murmur. Gastrointestinal system: Abdomen is nondistended, soft and nontender. No organomegaly or masses felt. Normal bowel sounds heard. Central nervous system: Alert and oriented. No focal neurological deficits. Extremities: No clubbing,  or cyanosis. No edema. Skin: No rashes, lesions or ulcers. Psychiatry: Judgement and insight appear normal. Mood & affect anxious.    The results of significant diagnostics from this hospitalization (including imaging, microbiology, ancillary and laboratory) are listed below for reference.     Procedures and Diagnostic Studies:   Dg Chest Port 1 View  Result Date: 04/25/2017 CLINICAL DATA:  Shortness of breath chest radiograph 04/24/2017 EXAM: PORTABLE CHEST 1 VIEW COMPARISON:  None. FINDINGS: Unchanged cardiomegaly with mild interstitial pulmonary edema. No pleural effusion. No pneumothorax. No focal consolidation. IMPRESSION: Cardiomegaly and mild interstitial pulmonary edema. Electronically Signed   By: Deatra Robinson M.D.   On: 04/25/2017 05:36   Dg Chest Port 1 View  Result Date: 04/24/2017 CLINICAL DATA:  Low hemoglobin and shortness of breath. EXAM: PORTABLE CHEST 1 VIEW COMPARISON:  None. FINDINGS: Interstitial coarsening that could be congestive or less likely bronchitic. No Kerley lines. Cardiomegaly and mild aortic tortuosity. No effusion or pneumothorax. Presumed hyperinflation with flat diaphragm. IMPRESSION: 1. Interstitial coarsening, favored congestive rather than bronchitic. 2. Cardiomegaly. Electronically Signed   By: Marnee Spring M.D.   On: 04/24/2017 16:25      Labs:   Basic Metabolic Panel:  Recent Labs Lab 04/25/17 0717 04/26/17 0305 04/27/17 0818 04/28/17 0253 04/29/17 0844  NA 138 139 138 139 137  K 4.1 3.7 3.8 4.3 4.5  CL 103 99* 99* 103 104  CO2 26 29 30 29 23   GLUCOSE 113* 114* 137* 142* 163*  BUN 24* 18 17 14 15   CREATININE 0.80 0.87 0.78 0.76 0.81  CALCIUM 8.6* 8.8* 8.8* 8.8* 9.1  MG  --  2.1 2.2  --   --    GFR Estimated Creatinine Clearance: 58.1 mL/min (by C-G formula based on SCr of 0.81 mg/dL). Liver Function Tests:  Recent Labs Lab 04/24/17 1538  AST 47*  ALT 41  ALKPHOS 102  BILITOT 0.3  PROT 6.8  ALBUMIN 3.4*   CBC:  Recent Labs Lab 04/24/17 1538 04/25/17 0717 04/26/17 0305 04/26/17 1615 04/27/17 0818 04/28/17 0253 04/29/17 0844  WBC 12.5* 8.9 8.9  --  7.6 9.3 9.3  NEUTROABS 9.2*  --   --   --   --   --   --   HGB 6.6* 7.1* 8.2* 8.7* 8.5* 8.5* 9.4*  HCT 23.2* 24.5* 28.1* 29.0* 28.9* 29.5* 32.4*  MCV 99.1 97.6 94.9  --  93.8 95.2 96.4  PLT 272 246 243  --  245 255 286   Cardiac Enzymes:  Recent Labs Lab 04/24/17 1538 04/24/17 2328 04/25/17 0717 04/25/17 1153  TROPONINI 0.08* 0.09* 0.09* 0.08*   CBG:  Recent Labs Lab 04/28/17 1715 04/28/17 2053 04/29/17 0043 04/29/17 0453 04/29/17 0804  GLUCAP 143* 110* 122* 124* 116*   Microbiology Recent Results (from the past 240 hour(s))  Culture, blood (Routine X 2) w Reflex to ID Panel     Status: None (Preliminary result)   Collection Time: 04/24/17  4:15 PM  Result Value Ref Range Status   Specimen Description BLOOD LEFT ANTECUBITAL  Final   Special Requests IN PEDIATRIC BOTTLE Blood Culture adequate volume  Final   Culture   Final    NO GROWTH 4 DAYS Performed at [facility], [address], [place], [place] [address]    Report Status PENDING  Incomplete  Culture, blood (Routine X 2) w Reflex to ID Panel     Status: None (Preliminary result)   Collection Time: 04/24/17  4:35 PM  Result Value Ref Range Status    Specimen Description BLOOD BLOOD LEFT HAND  Final   Special Requests   Final    BOTTLES DRAWN AEROBIC AND ANAEROBIC Blood Culture adequate volume   Culture   Final    NO GROWTH 4 DAYS Performed at [facility], [address] [place], [place] [address]    Report Status PENDING  Incomplete     Discharge Instructions:   Discharge Instructions    (HEART FAILURE PATIENTS) Call MD:  Anytime you have any of the following symptoms: 1) 3 pound weight gain in 24 hours or 5 pounds in 1 week 2) shortness of breath, with or without a dry hacking cough 3) swelling in the hands, feet or stomach 4) if you have to sleep on extra pillows at night in order to breathe.    Complete by:  As directed    Call MD for:  extreme fatigue    Complete by:  As directed    Call MD for:  persistant dizziness or light-headedness    Complete by:  As directed    Diet - low sodium heart healthy    Complete by:  As directed    Increase activity slowly    Complete by:  As directed      Allergies as of 04/29/2017      Reactions   Penicillins Anaphylaxis   Statins Other (See Comments)   Myalgias   Codeine    Prednisone Other (See Comments)   psychosis    Lidocaine Palpitations   Sulfa Antibiotics Rash      Medication List    STOP taking these medications   aspirin 81 MG EC tablet   levofloxacin 750 MG tablet Commonly known as:  LEVAQUIN   metoprolol succinate 50 MG 24 hr tablet Commonly known as:  TOPROL-XL     TAKE these medications   acetaminophen 325 MG tablet Commonly known as:  TYLENOL Take 2 tablets (650 mg total) by mouth every 4 (four) hours as needed for headache or mild pain.   allopurinol 100 MG tablet Commonly known as:  ZYLOPRIM Take 100 mg by mouth daily.   insulin NPH Human 100 UNIT/ML injection Commonly known as:  HUMULIN N,NOVOLIN N Inject 18-25 Units into the skin See admin instructions. Use 25 units every morning and use 18 units at night   levalbuterol 45 MCG/ACT  inhaler Commonly known as:  XOPENEX HFA Inhale 2 puffs into the lungs every 4 (four) hours as needed for wheezing.   metFORMIN 1000 MG tablet Commonly known as:  GLUCOPHAGE Take  1,000 mg by mouth 2 (two) times daily with a meal.   metoprolol tartrate 25 MG tablet Commonly known as:  LOPRESSOR Take 1 tablet (25 mg total) by mouth 4 (four) times daily.   multivitamin tablet Take 1 tablet by mouth daily.   nitroGLYCERIN 0.4 MG SL tablet Commonly known as:  NITROSTAT Place 1 tablet (0.4 mg total) under the tongue every 5 (five) minutes x 3 doses as needed for chest pain (or severe SOB).   omega-3 acid ethyl esters 1 g capsule Commonly known as:  LOVAZA Take 1-2 g by mouth See admin instructions. Take 2 capsules every morning and take 1 capsule every evening   pantoprazole 40 MG tablet Commonly known as:  PROTONIX Take 1 tablet (40 mg total) by mouth daily.   potassium chloride 10 MEQ CR capsule Commonly known as:  MICRO-K Take 2 capsules (20 mEq total) by mouth daily. What changed:  how much to take   torsemide 10 MG tablet Commonly known as:  DEMADEX Take 5 mg by mouth daily.            Discharge Care Instructions        Start     Ordered   04/29/17 0000  levalbuterol [facility] HFA) 45 MCG/ACT inhaler  Every 4 hours PRN     04/29/17 1030   04/29/17 0000  metoprolol tartrate (LOPRESSOR) 25 MG tablet  4 times daily     04/29/17 1030   04/29/17 0000  potassium chloride (MICRO-K) 10 MEQ CR capsule  Daily     04/29/17 1030   04/29/17 0000  pantoprazole (PROTONIX) 40 MG tablet  Daily     04/29/17 1030   04/29/17 0000  Increase activity slowly     04/29/17 1030   04/29/17 0000  Diet - low sodium heart healthy     04/29/17 1030   04/29/17 0000  Call MD for:  persistant dizziness or light-headedness     04/29/17 1030   04/29/17 0000  Call MD for:  extreme fatigue     04/29/17 1030   04/29/17 0000  (HEART FAILURE PATIENTS) Call MD:  Anytime you have any of the  following symptoms: 1) 3 pound weight gain in 24 hours or 5 pounds in 1 week 2) shortness of breath, with or without a dry hacking cough 3) swelling in the hands, feet or stomach 4) if you have to sleep on extra pillows at night in order to breathe.     04/29/17 1030        Time coordinating discharge: 35 minutes.  Signed:  RAMA,CHRISTINA  Pager [phone] [facility] 04/29/2017, 10:35 AM

## 2017-04-29 NOTE — Progress Notes (Signed)
Taylor Jenkins to be D/C'd Home per MD order.  Discussed prescriptions and follow up appointments with the patient. Prescriptions given to patient, medication list explained in detail. Pt verbalized understanding.  Allergies as of 04/29/2017      Reactions   Penicillins Anaphylaxis   Statins Other (See Comments)   Myalgias   Codeine    Prednisone Other (See Comments)   psychosis    Lidocaine Palpitations   Sulfa Antibiotics Rash      Medication List    STOP taking these medications   aspirin 81 MG EC tablet   levofloxacin 750 MG tablet Commonly known as:  LEVAQUIN   metoprolol succinate 50 MG 24 hr tablet Commonly known as:  TOPROL-XL     TAKE these medications   acetaminophen 325 MG tablet Commonly known as:  TYLENOL Take 2 tablets (650 mg total) by mouth every 4 (four) hours as needed for headache or mild pain.   allopurinol 100 MG tablet Commonly known as:  ZYLOPRIM Take 100 mg by mouth daily.   insulin NPH Human 100 UNIT/ML injection Commonly known as:  HUMULIN N,NOVOLIN N Inject 18-25 Units into the skin See admin instructions. Use 25 units every morning and use 18 units at night   levalbuterol 45 MCG/ACT inhaler Commonly known as:  XOPENEX HFA Inhale 2 puffs into the lungs every 4 (four) hours as needed for wheezing.   metFORMIN 1000 MG tablet Commonly known as:  GLUCOPHAGE Take 1,000 mg by mouth 2 (two) times daily with a meal.   metoprolol tartrate 25 MG tablet Commonly known as:  LOPRESSOR Take 1 tablet (25 mg total) by mouth 4 (four) times daily.   multivitamin tablet Take 1 tablet by mouth daily.   nitroGLYCERIN 0.4 MG SL tablet Commonly known as:  NITROSTAT Place 1 tablet (0.4 mg total) under the tongue every 5 (five) minutes x 3 doses as needed for chest pain (or severe SOB).   omega-3 acid ethyl esters 1 g capsule Commonly known as:  LOVAZA Take 1-2 g by mouth See admin instructions. Take 2 capsules every morning and take 1 capsule every  evening   pantoprazole 40 MG tablet Commonly known as:  PROTONIX Take 1 tablet (40 mg total) by mouth daily.   potassium chloride 10 MEQ CR capsule Commonly known as:  MICRO-K Take 2 capsules (20 mEq total) by mouth daily. What changed:  how much to take   torsemide 10 MG tablet Commonly known as:  DEMADEX Take 5 mg by mouth daily.            Discharge Care Instructions        Start     Ordered   04/29/17 0000  levalbuterol [facility] HFA) 45 MCG/ACT inhaler  Every 4 hours PRN     04/29/17 1030   04/29/17 0000  metoprolol tartrate (LOPRESSOR) 25 MG tablet  4 times daily     04/29/17 1030   04/29/17 0000  potassium chloride (MICRO-K) 10 MEQ CR capsule  Daily     04/29/17 1030   04/29/17 0000  pantoprazole (PROTONIX) 40 MG tablet  Daily     04/29/17 1030   04/29/17 0000  Increase activity slowly     04/29/17 1030   04/29/17 0000  Diet - low sodium heart healthy     04/29/17 1030   04/29/17 0000  Call MD for:  persistant dizziness or light-headedness     04/29/17 1030   04/29/17 0000  Call MD for:  extreme  fatigue     04/29/17 1030   04/29/17 0000  (HEART FAILURE PATIENTS) Call MD:  Anytime you have any of the following symptoms: 1) 3 pound weight gain in 24 hours or 5 pounds in 1 week 2) shortness of breath, with or without a dry hacking cough 3) swelling in the hands, feet or stomach 4) if you have to sleep on extra pillows at night in order to breathe.     04/29/17 1030      Vitals:   04/29/17 0800 04/29/17 0900  BP: (!) 123/101 112/82  Pulse: 65 70  Resp: 16   Temp: 98 F (36.7 C)   SpO2:      Skin clean, dry and intact without evidence of skin break down, no evidence of skin tears noted. IV catheter discontinued intact. Site without signs and symptoms of complications. Dressing and pressure applied. Pt denies pain at this time. No complaints noted.  An After Visit Summary was printed and given to the patient. Patient escorted via WC, and D/C home via private  auto.  Nelma Rothman, RN [facility] [address] Phone [phone]

## 2017-04-29 NOTE — Care Management Important Message (Signed)
Important Message  Patient Details  Name: Taylor Jenkins MRN: [id number] Date of Birth: 1941/07/05   Medicare Important Message Given:  Yes    Geneen Dieter 04/29/2017, 1:28 PM

## 2017-04-29 NOTE — Progress Notes (Signed)
Progress Note  Patient Name: Taylor Jenkins Date of Encounter: 04/29/2017  Primary Cardiologist:    Hochrein  Subjective   NO complaints still wondering when her endoscopy will be perfomred    Inpatient Medications    Scheduled Meds: . allopurinol  100 mg Oral Daily  . insulin aspart  0-9 Units Subcutaneous Q4H  . metoprolol tartrate  25 mg Oral QID  . multivitamin with minerals  1 tablet Oral Daily  . pantoprazole  40 mg Intravenous Q12H  . potassium chloride  20 mEq Oral Daily   Continuous Infusions:  PRN Meds: acetaminophen, iopamidol, levalbuterol, ondansetron **OR** ondansetron (ZOFRAN) IV   Vital Signs    Vitals:   04/28/17 1553 04/28/17 1847 04/28/17 2054 04/29/17 0505  BP: 115/74 120/65 (!) 144/81 (!) 110/59  Pulse:  62 71 74  Resp: Temp: 99 F (37.2 C) 98.1 F (36.7 C) 98.1 F (36.7 C) 97.9 F (36.6 C)  TempSrc: Oral Oral Oral   SpO2: 99% 100% 99% 94%  Weight:   189 lb 12.8 oz (86.1 kg)   Height:        Intake/Output Summary (Last 24 hours) at 04/29/17 0754 Last data filed at 04/29/17 0600  Gross per 24 hour  Intake              540 ml  Output                0 ml  Net              540 ml   Filed Weights   04/26/17 2007 04/27/17 2031 04/28/17 2054  Weight: 190 lb 6.4 oz (86.4 kg) 189 lb (85.7 kg) 189 lb 12.8 oz (86.1 kg)    Telemetry    Atrial fib with RVR - Personally Reviewed  ECG     - Personally Reviewed  Physical Exam   BP (!) 110/59 (BP Location: Right Arm)   Pulse 74   Temp 97.9 F (36.6 C)   Resp 18   Ht  (1.499 m)   Wt 189 lb 12.8 oz (86.1 kg)   SpO2 94%   BMI 38.33 kg/m  Affect appropriate Healthy:  Pale female  HEENT: normal Neck supple with no adenopathy JVP normal no bruits no thyromegaly Lungs clear with no wheezing and good diaphragmatic motion Heart:  S1/S2 AS  murmur, no rub, gallop or click PMI normal Abdomen: benighn, BS positve, no tenderness, no AAA no bruit.  No HSM or HJR Distal  pulses intact with no bruits No edema Neuro non-focal Skin warm and dry No muscular weakness   Labs    Chemistry Recent Labs Lab 04/24/17 1538  04/26/17 0305 04/27/17 0818 04/28/17 0253  NA 136  < > 139 138 139  K 4.1  < > 3.7 3.8 4.3  CL 99*  < > 99* 99* 103  CO2 24  < > GLUCOSE 108*  < > 114* 137* 142*  BUN 38*  < > CREATININE 0.83  < > 0.87 0.78 0.76  CALCIUM 8.5*  < > 8.8* 8.8* 8.8*  PROT 6.8  --   --   --   --   ALBUMIN 3.4*  --   --   --   --   AST 47*  --   --   --   --   ALT 41  --   --   --   --  ALKPHOS 102  --   --   --   --   BILITOT 0.3  --   --   --   --   GFRNONAA >60  < > >60 >60 >60  GFRAA >60  < > >60 >60 >60  ANIONGAP 13  < > 11 9 7   < > = values in this interval not displayed.   Hematology  Recent Labs Lab 04/26/17 0305 04/26/17 1615 04/27/17 0818 04/28/17 0253  WBC 8.9  --  7.6 9.3  RBC 2.96*  2.96*  --  3.08* 3.10*  HGB 8.2* 8.7* 8.5* 8.5*  HCT 28.1* 29.0* 28.9* 29.5*  MCV 94.9  --  93.8 95.2  MCH 27.7  --  27.6 27.4  MCHC 29.2*  --  29.4* 28.8*  RDW 18.7*  --  18.2* 18.4*  PLT 243  --  245 255    Cardiac Enzymes  Recent Labs Lab 04/24/17 1538 04/24/17 2328 04/25/17 0717 04/25/17 1153  TROPONINI 0.08* 0.09* 0.09* 0.08*   No results for input(s): TROPIPOC in the last 168 hours.   BNP  Recent Labs Lab 04/24/17 1538 04/26/17 1615  BNP 562.9* 723.0*     DDimer No results for input(s): DDIMER in the last 168 hours.   Radiology    Ct Abdomen Pelvis W Contrast  Result Date: 04/27/2017 CLINICAL DATA:  Colorectal cancer screen, avg risk, no hx. Anemia. Generalized weakness. EXAM: CT ABDOMEN AND PELVIS WITH CONTRAST TECHNIQUE: Multidetector CT imaging of the abdomen and pelvis was performed using the standard protocol following bolus administration of intravenous contrast. CONTRAST:  ISOVUE-300 IOPAMIDOL (ISOVUE-300) INJECTION 61% COMPARISON:  None. FINDINGS: Lower chest: Small bilateral pleural  effusions with adjacent compressive atelectasis. Mitral annulus calcifications are noted. Tiny hiatal hernia. Hepatobiliary: No focal hepatic lesion. Question of subtle capsular nodularity of the liver. Gallbladder physiologically distended, no calcified stone. No biliary dilatation. Pancreas: No ductal dilatation or inflammation. Spleen: Upper normal in size spanning 13.3 cm. No focal abnormality. Adrenals/Urinary Tract: No adrenal nodule. No hydronephrosis. No perinephric edema. Homogeneous renal enhancement. Symmetric excretion on delayed phase imaging. No focal renal lesion. Urinary bladder is physiologically distended, no bladder wall thickening. Stomach/Bowel: Small hiatal hernia. The stomach is decompressed. No small bowel thickening, inflammatory change or obstruction. Minimal diverticulosis in the sigmoid colon. No colonic wall thickening or evidence of focal colonic mass. The appendix is not visualized, no pericecal or right lower quadrant inflammation. Vascular/Lymphatic: Calcified and noncalcified atheromatous plaque in the abdominal aorta. No aneurysm. Prominent portacaval node measures 13 mm. Otherwise no abdominal or pelvic adenopathy. Reproductive: Uterus and bilateral adnexa are unremarkable. Other: No free air, free fluid, or intra-abdominal fluid collection. Tiny fat containing umbilical hernia. Soft tissue densities in the lower anterior abdominal wall at typical of injection sites. Musculoskeletal: Multilevel degenerative change throughout the spine. Degenerative change of both sacroiliac joints, pubic symphysis and both hips. There are no acute or suspicious osseous abnormalities. IMPRESSION: 1. No CT findings of colonic mass or suspicious wall thickening. 2. Mild sigmoid diverticulosis without acute inflammation. 3. Questionable but not definitive hepatic capsular nodularity, which can be seen in the setting of cirrhosis. Recommend correlation for cirrhosis risk factors. 4. Aortic  Atherosclerosis (ICD10-I70.0). Electronically Signed   By: Rubye Oaks M.D.   On: 04/27/2017 22:46    Cardiac Studies   ECHOCARDIOGRAM:  04/26/17  Study Conclusions  - Left ventricle: The cavity size was normal. There was mild   concentric hypertrophy. Systolic function was vigorous. The  estimated ejection fraction was in the range of 65% to 70%. Wall   motion was normal; there were no regional wall motion   abnormalities. The study was not technically sufficient to allow   evaluation of LV diastolic dysfunction due to atrial   fibrillation. - Aortic valve: Valve mobility was restricted. There was severe   stenosis. There was mild regurgitation. Mean gradient (S): 49 mm   Hg. Peak gradient (S): 77 mm Hg. Valve area (VTI): 0.65 cm^2.   Valve area (Vmax): 0.65 cm^2. Valve area (Vmean): 0.64 cm^2. - Aortic root: The aortic root was normal in size. - Mitral valve: Calcified annulus. Severely thickened leaflets .   There was mild regurgitation. - Left atrium: The atrium was moderately dilated. - Right ventricle: The cavity size was normal. Wall thickness was   normal. Systolic function was normal. - Right atrium: The atrium was normal in size. - Tricuspid valve: There was mild regurgitation. - Pulmonary arteries: Systolic pressure was within the normal   range. - Inferior vena cava: The vessel was normal in size. - Pericardium, extracardiac: There was no pericardial effusion.  Impressions:  - Severe aortic stenosis, referral to CT surgery is recommended.   Patient Profile     76 y.o. female with a hx of non-obs CAD 2016 cath, IDDM, HTN, HLD, anemia, D-CHF, who is being seen for the evaluation of atrial fib and elevated troponin at the request of Dr Darnelle Catalan.  Assessment & Plan    ACUTE BLOOD LOSS ANEMIA:  Wants Dr Loreta Ave to do endoscopy CT with no mass/CA  Lab Results  Component Value Date   HGBA1C 6.7 (H) 09/26/2014  Consider transfusion to Hb 8 given severe AS and  elevated troponin    ATRIAL FIB WITH RVR:  Ino anticoagulation until after endoscopy     ACUTE ON CHRONIC DIASTOLIC DYSFUNCTION:  Seems to be euvolemic.     ELEVATED TROPONIN:  Demand due to rapid afib and anemia no urgent need for cath   AS:  Severe by echo.  Will need right and left cath and CVTS referral once anemia better and after she has had endoscopy  Signed, Charlton Haws, MD  04/29/2017, 7:54 AM

## 2017-04-29 NOTE — Progress Notes (Signed)
   Advanced Directive completed and notarized.  Copy added to chart; original w/ patient.

## 2018-10-09 ENCOUNTER — Ambulatory Visit: Payer: Medicare HMO | Admitting: Internal Medicine

## 2018-10-09 ENCOUNTER — Encounter: Payer: Self-pay | Admitting: Internal Medicine

## 2018-10-09 VITALS — BP 134/84 | HR 89 | Wt 203.0 lb

## 2018-10-09 DIAGNOSIS — R0683 Snoring: Secondary | ICD-10-CM

## 2018-10-09 DIAGNOSIS — I10 Essential (primary) hypertension: Secondary | ICD-10-CM

## 2018-10-09 DIAGNOSIS — G4719 Other hypersomnia: Secondary | ICD-10-CM

## 2018-10-09 DIAGNOSIS — E782 Mixed hyperlipidemia: Secondary | ICD-10-CM

## 2018-10-09 DIAGNOSIS — I350 Nonrheumatic aortic (valve) stenosis: Secondary | ICD-10-CM | POA: Diagnosis not present

## 2018-10-09 DIAGNOSIS — I482 Chronic atrial fibrillation, unspecified: Secondary | ICD-10-CM

## 2018-10-09 NOTE — Patient Instructions (Signed)
Medication Instructions:  Continue current medications  If you need a refill on your cardiac medications before your next appointment, please call your pharmacy.  Labwork: None Ordered   Take the provided lab slips with you to the lab for your blood draw.   When you have your labs (blood work) drawn today and your tests are completely normal, you will receive your results only by MyChart Message (if you have MyChart) -OR-  A paper copy in the mail.  If you have any lab test that is abnormal or we need to change your treatment, we will call you to review these results.  Testing/Procedures: Your physician has requested that you have an echocardiogram. Echocardiography is a painless test that uses sound waves to create images of your heart. It provides your doctor with information about the size and shape of your heart and how well your heart's chambers and valves are working. This procedure takes approximately one hour. There are no restrictions for this procedure.   Follow-Up: . You have been referred to Dr Excell Seltzer, or Dr Clifton James . Your physician recommends that you schedule a follow-up appointment in: 3 Months with Dr Rennis Golden   At [facility], you and your health needs are our priority.  As part of our continuing mission to provide you with exceptional heart care, we have created designated Provider Care Teams.  These Care Teams include your primary Cardiologist (physician) and Advanced Practice Providers (APPs -  Physician Assistants and Nurse Practitioners) who all work together to provide you with the care you need, when you need it.  Thank you for choosing [facility] at [facility]!!

## 2018-10-09 NOTE — Progress Notes (Signed)
OFFICE CONSULT NOTE  Chief Complaint:  Establish cardiologist  Primary Care Physician: Rubie Maid, MD  HPI:  Taylor Jenkins is a 78 y.o. female who is being seen today for the evaluation of establish cardiologist at the request of Rubie Maid, MD.  This is a pleasant 78 year old female who is here today to establish a new cardiologist.  She was previously followed by Dr. Darral Dash in [facility].  Past medical history significant for aortic stenosis, chronic atrial fibrillation, morbid obesity, mitral annular calcification, AVM with iron deficiency anemia and chronic blood loss, cardiomegaly, type 2 diabetes, hypertension and dyslipidemia.  Her 2 daughters are both nurses, 1 working at [employer] in [place] the other is the Therapist, sports on [employer] [place] at [employer].  Her last echocardiogram was in October 2018.  This demonstrated LVEF 70%, mild LVH, moderate MAC, trileaflet aortic valve with moderate to severe aortic stenosis and a mean gradient of 42 mmHg, and a severely dilated left atrium.  Despite this the office notes indicated moderate to severe left ear.  Symptomatically, however she has had worsening shortness of breath, significant fatigue and chest discomfort with exertion.  This is been progressively worse in fact has limited her activities significantly.  In addition she is struggled with anemia and recently thought this was the reason for her fatigue.  She however did have blood work which showed no significant iron deficiency at this point.  Because of small bowel AVMs and her anemia, apparently she has not been anticoagulated appropriately for her degree of atrial fibrillation.  Her CHADSVASC score is high, likely 4-5, indicating at least a 10% annual risk of stroke.  Finally, in addition to her fatigue, she reports poor sleep at night.  She had an Epworth sleepiness scale score of 18 which is significantly elevated.  Per her daughter she has a loud snoring, witnessed apnea, increased neck  thickness, morbid obesity and other risk factors for sleep apnea.  A sleep study is not yet been performed.  PMHx:  Past Medical History:  Diagnosis Date  . Acute diastolic CHF (congestive heart failure) ([place]) 09/25/2014  . Anemia   . Aortic atherosclerosis ([place]) 04/29/2017  . CAD (coronary artery disease) 2016   non-obstructive at cath  . Diabetes mellitus without complication ([place])    type 2  . Elevated troponin 2016   secondary to demand ischemia from CHF  . Hyperlipidemia associated with type 2 diabetes mellitus ([place])   . Hypertension   . Moderate aortic stenosis   . New onset atrial fibrillation ([place])   . Severe aortic stenosis 04/26/2017    Past Surgical History:  Procedure Laterality Date  . LEFT HEART CATHETERIZATION WITH CORONARY ANGIOGRAM N/A 09/27/2014   Non-obstructive disease, CRF reduction recommended. Procedure: LEFT HEART CATHETERIZATION WITH CORONARY ANGIOGRAM;  Surgeon: Blane Ohara, MD;  Location: [facility] CATH LAB;  Service: Cardiovascular;  Laterality: N/A;    FAMHx:  Family History  Problem Relation Age of Onset  . Heart attack Mother 28  . Heart attack Brother 74    SOCHx:   reports that she has never smoked. She has never used smokeless tobacco. She reports that she does not drink alcohol or use drugs.  ALLERGIES:  Allergies  Allergen Reactions  . Penicillins Anaphylaxis  . Statins Other (See Comments)    Myalgias   . Codeine   . Prednisone Other (See Comments)    psychosis   . Lidocaine Palpitations  . Sulfa Antibiotics Rash    ROS:  Pertinent items noted in HPI and remainder of comprehensive ROS otherwise negative.  HOME MEDS: Current Outpatient Medications on File Prior to Visit  Medication Sig Dispense Refill  . acetaminophen (TYLENOL) 325 MG tablet Take 2 tablets (650 mg total) by mouth every 4 (four) hours as needed for headache or mild pain.    Marland Kitchen allopurinol (ZYLOPRIM) 100 MG tablet Take 100 mg by mouth daily.    . insulin NPH Human  (HUMULIN N,NOVOLIN N) 100 UNIT/ML injection Inject 18-25 Units into the skin See admin instructions. Use 25 units every morning and use 18 units at night    . levalbuterol (XOPENEX HFA) 45 MCG/ACT inhaler Inhale 2 puffs into the lungs every 4 (four) hours as needed for wheezing. 1 Inhaler 2  . metFORMIN (GLUCOPHAGE) 1000 MG tablet Take 1,000 mg by mouth 2 (two) times daily with a meal.    . metoprolol tartrate (LOPRESSOR) 25 MG tablet Take 1 tablet (25 mg total) by mouth 4 (four) times daily. 120 tablet 2  . Multiple Vitamin (MULTIVITAMIN) tablet Take 1 tablet by mouth daily.    . nitroGLYCERIN (NITROSTAT) 0.4 MG SL tablet Place 1 tablet (0.4 mg total) under the tongue every 5 (five) minutes x 3 doses as needed for chest pain (or severe SOB). 25 tablet 2  . omega-3 acid ethyl esters (LOVAZA) 1 G capsule Take 1-2 g by mouth See admin instructions. Take 2 capsules every morning and take 1 capsule every evening    . pantoprazole (PROTONIX) 40 MG tablet Take 1 tablet (40 mg total) by mouth daily. 30 tablet 1  . potassium chloride (MICRO-K) 10 MEQ CR capsule Take 2 capsules (20 mEq total) by mouth daily. 60 capsule 2  . torsemide (DEMADEX) 10 MG tablet Take 5 mg by mouth daily.      No current facility-administered medications on file prior to visit.     LABS/IMAGING: No results found for this or any previous visit (from the past 48 hour(s)). No results found.  LIPID PANEL:    Component Value Date/Time   CHOL 219 (H) 09/26/2014 0030   TRIG 231 (H) 09/26/2014 0030   HDL 43 09/26/2014 0030   CHOLHDL 5.1 09/26/2014 0030   VLDL 46 (H) 09/26/2014 0030   LDLCALC 130 (H) 09/26/2014 0030    WEIGHTS: Wt Readings from Last 3 Encounters:  10/09/18 203 lb (92.1 kg)  04/28/17 189 lb 12.8 oz (86.1 kg)  08/02/15 216 lb (98 kg)    VITALS: BP 134/84   Pulse 89   Wt 203 lb (92.1 kg)   BMI 41.00 kg/m   EXAM: General appearance: alert and no distress Neck: no carotid bruit, no JVD and thyroid not  enlarged, symmetric, no tenderness/mass/nodules Lungs: clear to auscultation bilaterally Heart: regular rate and rhythm, S1: normal, S2: Absent and systolic murmur: systolic ejection 3/6, crescendo at 2nd right intercostal space Abdomen: soft, non-tender; bowel sounds normal; no masses,  no organomegaly and obese Extremities: extremities normal, atraumatic, no cyanosis or edema Pulses: 2+ and symmetric Skin: Skin color, texture, turgor normal. No rashes or lesions Neurologic: Grossly normal Psych: Pleasant  EKG: A. fib at 89- personally reviewed  ASSESSMENT: 1. Severe symptomatic aortic stenosis 2. Longstanding persistent if not permanent atrial fibrillation 3. Severe left atrial enlargement 4. Morbid obesity 5. Type 2 diabetes 6. Hypertension 7. Hyperlipidemia 8. Family history of premature onset coronary disease 9. Probable obstructive sleep apnea  PLAN: 1.   Mrs. alvira has severe symptomatic aortic stenosis.  I cannot  auscultate a second heart sound and her most recent echo in 2018 showed a mean gradient of 42 mmHg.  I will plan to repeat another echocardiogram and I suspect that her gradient is even higher.  We will refer her to the multidisciplinary valve clinic.  Because she cannot tolerate anticoagulation due to AVMs and bleeding, perhaps if she were a candidate for SAVR she could have left atrial appendage occlusion.  Alternatively, we would have to consider how we could protect her from stroke long-term if she were to proceed to TAVR.  I am not sure if TAVR and watchman could be done concomitantly or successively.  We will also refer her for sleep study based on high EPWSS score and concerning symptoms for sleep apnea.  Follow-up with me in 3 months.  Pixie Casino, MD, [facility], [place] Director of the Advanced Lipid Disorders &  Cardiovascular Risk Reduction Clinic Diplomate of the American Board of Clinical Lipidology Attending  Cardiologist  Direct Dial: [phone]  Fax: [phone]  Website:  [email or web address] 10/09/2018, 5:11 PM

## 2018-10-13 ENCOUNTER — Ambulatory Visit (HOSPITAL_COMMUNITY): Payer: Medicare HMO | Attending: Cardiology

## 2018-10-13 DIAGNOSIS — I350 Nonrheumatic aortic (valve) stenosis: Secondary | ICD-10-CM

## 2018-10-14 NOTE — H&P (View-Only) (Signed)
6  Chief Complaint  Patient presents with  . New Patient (Initial Visit)    Severe aortic stenosis   History of Present Illness: 78 yo female with history of diastolic CHF, CAD, DM, hyperlipidemia, HTN, atrial fibrillation and severe aortic stenosis here today as a new consult, referred by Dr. Rennis Golden, for further discussion regarding her aortic stenosis and possible TAVR. Her aortic stenosis has been followed for several years by Dr. Heron Nay in [facility]. She was seen on 10/09/18 as a new patient by Dr. Rennis Golden. Echocardiogram in 2018 with normal LV systolic function, moderate to severe aortic stenosis with mean gradient of 42 mmHg. She has a history of iron deficiency anemia and small bowel AVMs. She has been receiving iron infusions. She has persistent atrial fibrillation but has not been on anti-coagulation because of prior GI bleeding and anemia. (CHADS VASC score of 4-5). She had a cardiac cath in 2016 with mild CAD reported. She had been felt to be asymptomatic in regards to her aortic stenosis prior to recently. She has now begun to have progressive fatigue, dyspnea on exertion and chest pressure. Echocardiogram 10/13/18 at [facility] with LVEF=60-65% with moderate LVH. The aortic valve leaflets are thickened and calcified with limited leaflet mobility. Mean gradient 58 mmHg, peak gradient 100 mmHg. AVA 0.75 cm2. Dimensionless index 0.18. There is at least mild aortic valve insufficiency. Mild mitral regurgitation.   She tells me today that she has been having dyspnea with minimal exertion. She has no energy. She has tatigue with minimal exertion. No chest pain. She has lower extremity edema for several years. No dizziness, near syncope or syncope. She lives alone in [place], [place]. She is retired as a Diplomatic Services operational officer. She sees the dentist regularly. She has no active dental issues. Her daughter is a Engineer, civil (consulting) on 4E at [employer]. Her other daughter is a Engineer, civil (consulting) at [employer].   Primary Care Physician: Tarri Fuller, MD Primary Cardiologist: [facility] Referring Cardiologist: Rennis Golden  Past Medical History:  Diagnosis Date  . Acute diastolic CHF (congestive heart failure) (HCC) 09/25/2014  . Anemia   . Aortic atherosclerosis (HCC) 04/29/2017  . CAD (coronary artery disease) 2016   non-obstructive at cath  . Diabetes mellitus without complication (HCC)    type 2  . Elevated troponin 2016   secondary to demand ischemia from CHF  . Hyperlipidemia associated with type 2 diabetes mellitus (HCC)   . Hypertension   . Moderate aortic stenosis   . New onset atrial fibrillation (HCC)   . Severe aortic stenosis 04/26/2017    Past Surgical History:  Procedure Laterality Date  . LEFT HEART CATHETERIZATION WITH CORONARY ANGIOGRAM N/A 09/27/2014   Non-obstructive disease, CRF reduction recommended. Procedure: LEFT HEART CATHETERIZATION WITH CORONARY ANGIOGRAM;  Surgeon: Micheline Chapman, MD;  Location: [facility] CATH LAB;  Service: Cardiovascular;  Laterality: N/A;    Current Outpatient Medications  Medication Sig Dispense Refill  . acetaminophen (TYLENOL) 325 MG tablet Take 2 tablets (650 mg total) by mouth every 4 (four) hours as needed for headache or mild pain.    Marland Kitchen allopurinol (ZYLOPRIM) 100 MG tablet Take 100 mg by mouth daily.    Marland Kitchen aspirin EC 81 MG tablet Take 81 mg by mouth daily.    . insulin NPH Human (HUMULIN N,NOVOLIN N) 100 UNIT/ML injection Inject 18-25 Units into the skin See admin instructions. Use 25 units every morning and use 18 units at night    . levalbuterol (XOPENEX HFA) 45 MCG/ACT inhaler Inhale 2 puffs  into the lungs every 4 (four) hours as needed for wheezing. 1 Inhaler 2  . metFORMIN (GLUCOPHAGE) 1000 MG tablet Take 1,000 mg by mouth 2 (two) times daily with a meal.    . metoprolol tartrate (LOPRESSOR) 25 MG tablet Take 1 tablet (25 mg total) by mouth 4 (four) times daily. 120 tablet 2  . Multiple Vitamin (MULTIVITAMIN) tablet Take 1 tablet by mouth daily.    . nitroGLYCERIN (NITROSTAT)  0.4 MG SL tablet Place 1 tablet (0.4 mg total) under the tongue every 5 (five) minutes x 3 doses as needed for chest pain (or severe SOB). 25 tablet 2  . omega-3 acid ethyl esters (LOVAZA) 1 G capsule Take 1-2 g by mouth See admin instructions. Take 2 capsules every morning and take 1 capsule every evening    . potassium chloride (MICRO-K) 10 MEQ CR capsule Take 2 capsules (20 mEq total) by mouth daily. 60 capsule 2  . torsemide (DEMADEX) 10 MG tablet Take 5 mg by mouth daily.     . pantoprazole (PROTONIX) 40 MG tablet Take 1 tablet (40 mg total) by mouth daily. 30 tablet 1   No current facility-administered medications for this visit.     Allergies  Allergen Reactions  . Penicillins Anaphylaxis  . Statins Other (See Comments)    Myalgias   . Codeine   . Prednisone Other (See Comments)    psychosis   . Lidocaine Palpitations  . Sulfa Antibiotics Rash    Social History   Socioeconomic History  . Marital status: Widowed    Spouse name: Not on file  . Number of children: 4  . Years of education: Not on file  . Highest education level: Not on file  Occupational History  . Occupation: Retired-Secretary  Social Needs  . Financial resource strain: Not on file  . Food insecurity:    Worry: Not on file    Inability: Not on file  . Transportation needs:    Medical: Not on file    Non-medical: Not on file  Tobacco Use  . Smoking status: Never Smoker  . Smokeless tobacco: Never Used  Substance and Sexual Activity  . Alcohol use: No    Alcohol/week: 0.0 standard drinks  . Drug use: No  . Sexual activity: Not on file  Lifestyle  . Physical activity:    Days per week: Not on file    Minutes per session: Not on file  . Stress: Not on file  Relationships  . Social connections:    Talks on phone: Not on file    Gets together: Not on file    Attends religious service: Not on file    Active member of club or organization: Not on file    Attends meetings of clubs or  organizations: Not on file    Relationship status: Not on file  . Intimate partner violence:    Fear of current or ex partner: Not on file    Emotionally abused: Not on file    Physically abused: Not on file    Forced sexual activity: Not on file  Other Topics Concern  . Not on file  Social History Narrative   Pt lives alone in [place].     Family History  Problem Relation Age of Onset  . Transient ischemic attack Mother   . Heart attack Brother 47    Review of Systems:  As stated in the HPI and otherwise negative.   BP 124/64   Pulse 78  Ht 4\' 11"  (1.499 m)   Wt 203 lb (92.1 kg)   BMI 41.00 kg/m   Physical Examination: General: Well developed, well nourished, NAD  HEENT: OP clear, mucus membranes moist  SKIN: warm, dry. No rashes. Neuro: No focal deficits  Musculoskeletal: Muscle strength 5/5 all ext  Psychiatric: Mood and affect normal  Neck: No JVD, no carotid bruits, no thyromegaly, no lymphadenopathy.  Lungs:Clear bilaterally, no wheezes, rhonci, crackles Cardiovascular: Irreg irreg. Loud, harsh systolic murmur.  Abdomen:Soft. Bowel sounds present. Non-tender.  Extremities: No lower extremity edema. Pulses are 2 + in the bilateral DP/PT.  Echo 10/13/18:  1. The left ventricle has normal systolic function of 60-65%. The cavity size was normal. There is moderately increased left ventricular wall thickness. Left ventricular diastolic function could not be evaluated secondary to atrial fibrillation.  2. The right ventricle has normal systolic function. The cavity size was normal. There is no increase in right ventricular wall thickness. Right ventricular systolic pressure is moderately elevated with an estimated pressure of 49.8 mmHg.  3. Left atrial size was moderately dilated.  4. There is mild thickening. There is moderate mitral annular calcification present. The MR jet is centrally-directed. Mild mitral valve stenosis. Mean gradient 5 mm Hg at a mean heart rate of  88 bpm.  5. The tricuspid valve is normal in structure.  6. The aortic valve is tricuspid There is severe thickening and severe calcifcation of the aortic valve, with severely decreased cusp excursion. Aortic valve regurgitation is mild to moderate by color flow Doppler. The calculated aortic valve area is  0.75 cm, consistent with severe stenosis. The mean gradient is 58 mm Hg.  7. The pulmonic valve was normal in structure.  8. No evidence of left ventricular regional wall motion abnormalities.  FINDINGS  Left Ventricle: The left ventricle has normal systolic function of 60-65%. The cavity size was normal. There is moderately increased left ventricular wall thickness. Left ventricular diastology could not be evaluated secondary to atrial fibrillation. No  evidence of left ventricular regional wall motion abnormalities.. Right Ventricle: The right ventricle has normal systolic function. The cavity was normal. There is no increase in right ventricular wall thickness. Right ventricular systolic pressure is moderately elevated with an estimated pressure of 49.8 mmHg. Left Atrium: left atrial size was moderately dilated Right Atrium: right atrial size was normal in size Interatrial Septum: No atrial level shunt detected by color flow Doppler.  Pericardium: There is no evidence of pericardial effusion. Mitral Valve: The mitral valve is degenerative in appearance. There is mild thickening. There is moderate mitral annular calcification present. Mitral valve regurgitation is mild by color flow Doppler. The MR jet is centrally-directed. mild mitral valve  stenosis. Tricuspid Valve: The tricuspid valve is normal in structure. Tricuspid valve regurgitation is mild by color flow Doppler. Aortic Valve: The aortic valve is tricuspid There is severe thickening of the aortic valve, with severely decreased cusp excursion. Aortic valve regurgitation is mild to moderate by color flow Doppler. The calculated  aortic valve area is 0.75 cm,  consistent with severe stenosis. Pulmonic Valve: The pulmonic valve was normal in structure. Pulmonic valve regurgitation is not visualized by color flow Doppler. Venous: The inferior vena cava is normal in size with greater than 50% respiratory variability.   LEFT VENTRICLE PLAX 2D (Teich) LV EF:          76.5 %   Diastology LVIDd:          3.14 cm  LV e'  lateral:   6.53 cm/s LVIDs:          1.76 cm  LV E/e' lateral: 25.1 LV PW:          1.46 cm  LV e' medial:    5.00 cm/s LV IVS:         1.71 cm  LV E/e' medial:  32.8 LVOT diam:      2.30 cm LV SV:          30 ml LVOT Area:      4.15 cm  RIGHT VENTRICLE RV S prime:     11.20 cm/s TAPSE (M-mode): 1.5 cm RVSP:           49.8 mmHg  LEFT ATRIUM              Index       RIGHT ATRIUM           Index LA diam:        4.90 cm  2.64 cm/m  RA Pressure: 3 mmHg LA Vol (A2C):   79.3 ml  42.72 ml/m RA Area:     17.10 cm LA Vol (A4C):   106.0 ml 57.11 ml/m RA Volume:   47.20 ml  25.43 ml/m LA Biplane Vol: 92.5 ml  49.84 ml/m  AORTIC VALVE AV Area (Vmax):    0.79 cm AV Area (Vmean):   0.71 cm AV Area (VTI):     0.75 cm AV Vmax:           500.00 cm/s AV Vmean:          351.200 cm/s AV VTI:            1.222 m AV Peak Grad:      100.0 mmHg AV Mean Grad:      57.8 mmHg LVOT Vmax:         94.50 cm/s LVOT Vmean:        59.900 cm/s LVOT VTI:          0.221 m LVOT/AV VTI ratio: 0.18 AR PHT:            445 msec   AORTA Ao Root diam: 3.00 cm Ao Asc diam:  3.50 cm  MITRAL VALVE               TR Peak grad: 46.8 mmHg MV Area (PHT): 2.82 cm    TR Vmax:      347.00 cm/s MV Peak grad:  16.5 mmHg   RVSP:         49.8 mmHg MV Mean grad:  5.0 mmHg MV Vmax:       2.03 m/s MV Vmean:      97.7 cm/s MV VTI:        0.46 m MV PHT:        78 msec MV Decel Time: 338 msec MR Peak grad: 199.4 mmHg MR Mean grad: 133.0 mmHg MR Vmax:      706.00 cm/s MR Vmean:     544.0 cm/s MR PISA:      1.01 MV E velocity:  164.00 cm/s  EKG:  EKG is not ordered today. The ekg from 10/09/18 shows atrial fibrillation  Recent Labs: No results found for requested labs within last 8760 hours.    Wt Readings from Last 3 Encounters:  10/15/18 203 lb (92.1 kg)  10/09/18 203 lb (92.1 kg)  04/28/17 189 lb 12.8 oz (86.1 kg)     Other studies Reviewed: Additional studies/ records that were reviewed  today include: echo images, office notes, EKG Review of the above records demonstrates: severe AS   Assessment and Plan:   1. Severe aortic valve stenosis: She has severe, stage D aortic valve stenosis. I have personally reviewed the echo images. The aortic valve is thickened, calcified with limited leaflet mobility. I think she would benefit from AVR. Given advanced age, she is not a good candidate for conventional AVR by surgical approach. I think she may be a good candidate for TAVR.   STS Risk Score: Risk of Mortality: 4.868%  Renal Failure: 6.049%  Permanent Stroke: 1.404%  Prolonged Ventilation: 15.695%  DSW Infection: 0.282%  Reoperation: 3.528%  Morbidity or Mortality: 24.729%  Short Length of Stay: 15.728%  Long Length of Stay: 14.245%  I have reviewed the natural history of aortic stenosis with the patient and their family members  who are present today. We have discussed the limitations of medical therapy and the poor prognosis associated with symptomatic aortic stenosis. We have reviewed potential treatment options, including palliative medical therapy, conventional surgical aortic valve replacement, and transcatheter aortic valve replacement. We discussed treatment options in the context of the patient's specific comorbid medical conditions.   She would like to proceed with planning for TAVR. I [facility]. Risks and benefits of the cath procedure and the TAVR procedure are reviewed with the patient. After the cath, she will have a gated cardiac CTA,  CTA of the chest/abdomen and pelvis, carotid dopplers, PFTs, PT assessment and will then be referred to see one of the CT surgeons on our TAVR team.   We will assess her left atrial appendage for thrombus with the gated cardiac CT. If she has evidence of LAA thrombus, we may have to start a trial of anti-coagulation before proceeding with TAVR.    Current medicines are reviewed at length with the patient today.  The patient does not have concerns regarding medicines.  The following changes have been made:  no change  Labs/ tests ordered today include:   Orders Placed This Encounter  Procedures  . Basic Metabolic Panel (BMET)  . CBC     Disposition:   FU with the valve team.    Signed, Verne Carrowhristopher Shilah Hefel, MD 10/15/2018 9:51 AM    [facility] [address], [place], [place]  [address] Phone: [phone]; Fax: [phone]

## 2018-10-14 NOTE — Progress Notes (Signed)
6  Chief Complaint  Patient presents with  . New Patient (Initial Visit)    Severe aortic stenosis   History of Present Illness: 78 yo female with history of diastolic CHF, CAD, DM, hyperlipidemia, HTN, atrial fibrillation and severe aortic stenosis here today as a new consult, referred by Dr. Hilty, for further discussion regarding her aortic stenosis and possible TAVR. Her aortic stenosis has been followed for several years by Dr. Khalil in [place]. She was seen on 10/09/18 as a new patient by Dr. Hilty. Echocardiogram in 2018 with normal LV systolic function, moderate to severe aortic stenosis with mean gradient of 42 mmHg. She has a history of iron deficiency anemia and small bowel AVMs. She has been receiving iron infusions. She has persistent atrial fibrillation but has not been on anti-coagulation because of prior GI bleeding and anemia. (CHADS VASC score of 4-5). She had a cardiac cath in 2016 with mild CAD reported. She had been felt to be asymptomatic in regards to her aortic stenosis prior to recently. She has now begun to have progressive fatigue, dyspnea on exertion and chest pressure. Echocardiogram 10/13/18 at [facility] with LVEF=60-65% with moderate LVH. The aortic valve leaflets are thickened and calcified with limited leaflet mobility. Mean gradient 58 mmHg, peak gradient 100 mmHg. AVA 0.75 cm2. Dimensionless index 0.18. There is at least mild aortic valve insufficiency. Mild mitral regurgitation.   She tells me today that she has been having dyspnea with minimal exertion. She has no energy. She has tatigue with minimal exertion. No chest pain. She has lower extremity edema for several years. No dizziness, near syncope or syncope. She lives alone in [place], [place]. She is retired as a secretary. She sees the dentist regularly. She has no active dental issues. Her daughter is a nurse on 4E at [facility]. Her other daughter is a nurse at [employer] [place].   Primary Care Physician: Escajeda,  Richard, MD Primary Cardiologist: Hilty Referring Cardiologist: Hilty  Past Medical History:  Diagnosis Date  . Acute diastolic CHF (congestive heart failure) (HCC) 09/25/2014  . Anemia   . Aortic atherosclerosis (HCC) 04/29/2017  . CAD (coronary artery disease) 2016   non-obstructive at cath  . Diabetes mellitus without complication (HCC)    type 2  . Elevated troponin 2016   secondary to demand ischemia from CHF  . Hyperlipidemia associated with type 2 diabetes mellitus (HCC)   . Hypertension   . Moderate aortic stenosis   . New onset atrial fibrillation (HCC)   . Severe aortic stenosis 04/26/2017    Past Surgical History:  Procedure Laterality Date  . LEFT HEART CATHETERIZATION WITH CORONARY ANGIOGRAM N/A 09/27/2014   Non-obstructive disease, CRF reduction recommended. Procedure: LEFT HEART CATHETERIZATION WITH CORONARY ANGIOGRAM;  Surgeon: Michael D Cooper, MD;  Location: [facility] CATH LAB;  Service: Cardiovascular;  Laterality: N/A;    Current Outpatient Medications  Medication Sig Dispense Refill  . acetaminophen (TYLENOL) 325 MG tablet Take 2 tablets (650 mg total) by mouth every 4 (four) hours as needed for headache or mild pain.    . allopurinol (ZYLOPRIM) 100 MG tablet Take 100 mg by mouth daily.    . aspirin EC 81 MG tablet Take 81 mg by mouth daily.    . insulin NPH Human (HUMULIN N,NOVOLIN N) 100 UNIT/ML injection Inject 18-25 Units into the skin See admin instructions. Use 25 units every morning and use 18 units at night    . levalbuterol (XOPENEX HFA) 45 MCG/ACT inhaler Inhale 2 puffs   into the lungs every 4 (four) hours as needed for wheezing. 1 Inhaler 2  . metFORMIN (GLUCOPHAGE) 1000 MG tablet Take 1,000 mg by mouth 2 (two) times daily with a meal.    . metoprolol tartrate (LOPRESSOR) 25 MG tablet Take 1 tablet (25 mg total) by mouth 4 (four) times daily. 120 tablet 2  . Multiple Vitamin (MULTIVITAMIN) tablet Take 1 tablet by mouth daily.    . nitroGLYCERIN (NITROSTAT)  0.4 MG SL tablet Place 1 tablet (0.4 mg total) under the tongue every 5 (five) minutes x 3 doses as needed for chest pain (or severe SOB). 25 tablet 2  . omega-3 acid ethyl esters (LOVAZA) 1 G capsule Take 1-2 g by mouth See admin instructions. Take 2 capsules every morning and take 1 capsule every evening    . potassium chloride (MICRO-K) 10 MEQ CR capsule Take 2 capsules (20 mEq total) by mouth daily. 60 capsule 2  . torsemide (DEMADEX) 10 MG tablet Take 5 mg by mouth daily.     . pantoprazole (PROTONIX) 40 MG tablet Take 1 tablet (40 mg total) by mouth daily. 30 tablet 1   No current facility-administered medications for this visit.     Allergies  Allergen Reactions  . Penicillins Anaphylaxis  . Statins Other (See Comments)    Myalgias   . Codeine   . Prednisone Other (See Comments)    psychosis   . Lidocaine Palpitations  . Sulfa Antibiotics Rash    Social History   Socioeconomic History  . Marital status: Widowed    Spouse name: Not on file  . Number of children: 4  . Years of education: Not on file  . Highest education level: Not on file  Occupational History  . Occupation: Retired-Secretary  Social Needs  . Financial resource strain: Not on file  . Food insecurity:    Worry: Not on file    Inability: Not on file  . Transportation needs:    Medical: Not on file    Non-medical: Not on file  Tobacco Use  . Smoking status: Never Smoker  . Smokeless tobacco: Never Used  Substance and Sexual Activity  . Alcohol use: No    Alcohol/week: 0.0 standard drinks  . Drug use: No  . Sexual activity: Not on file  Lifestyle  . Physical activity:    Days per week: Not on file    Minutes per session: Not on file  . Stress: Not on file  Relationships  . Social connections:    Talks on phone: Not on file    Gets together: Not on file    Attends religious service: Not on file    Active member of club or organization: Not on file    Attends meetings of clubs or  organizations: Not on file    Relationship status: Not on file  . Intimate partner violence:    Fear of current or ex partner: Not on file    Emotionally abused: Not on file    Physically abused: Not on file    Forced sexual activity: Not on file  Other Topics Concern  . Not on file  Social History Narrative   Pt lives alone in [place].     Family History  Problem Relation Age of Onset  . Transient ischemic attack Mother   . Heart attack Brother 47    Review of Systems:  As stated in the HPI and otherwise negative.   BP 124/64   Pulse 78     Ht 4' 11" (1.499 m)   Wt 203 lb (92.1 kg)   BMI 41.00 kg/m   Physical Examination: General: Well developed, well nourished, NAD  HEENT: OP clear, mucus membranes moist  SKIN: warm, dry. No rashes. Neuro: No focal deficits  Musculoskeletal: Muscle strength 5/5 all ext  Psychiatric: Mood and affect normal  Neck: No JVD, no carotid bruits, no thyromegaly, no lymphadenopathy.  Lungs:Clear bilaterally, no wheezes, rhonci, crackles Cardiovascular: Irreg irreg. Loud, harsh systolic murmur.  Abdomen:Soft. Bowel sounds present. Non-tender.  Extremities: No lower extremity edema. Pulses are 2 + in the bilateral DP/PT.  Echo 10/13/18:  1. The left ventricle has normal systolic function of 60-65%. The cavity size was normal. There is moderately increased left ventricular wall thickness. Left ventricular diastolic function could not be evaluated secondary to atrial fibrillation.  2. The right ventricle has normal systolic function. The cavity size was normal. There is no increase in right ventricular wall thickness. Right ventricular systolic pressure is moderately elevated with an estimated pressure of 49.8 mmHg.  3. Left atrial size was moderately dilated.  4. There is mild thickening. There is moderate mitral annular calcification present. The MR jet is centrally-directed. Mild mitral valve stenosis. Mean gradient 5 mm Hg at a mean heart rate of  88 bpm.  5. The tricuspid valve is normal in structure.  6. The aortic valve is tricuspid There is severe thickening and severe calcifcation of the aortic valve, with severely decreased cusp excursion. Aortic valve regurgitation is mild to moderate by color flow Doppler. The calculated aortic valve area is  0.75 cm, consistent with severe stenosis. The mean gradient is 58 mm Hg.  7. The pulmonic valve was normal in structure.  8. No evidence of left ventricular regional wall motion abnormalities.  FINDINGS  Left Ventricle: The left ventricle has normal systolic function of 60-65%. The cavity size was normal. There is moderately increased left ventricular wall thickness. Left ventricular diastology could not be evaluated secondary to atrial fibrillation. No  evidence of left ventricular regional wall motion abnormalities.. Right Ventricle: The right ventricle has normal systolic function. The cavity was normal. There is no increase in right ventricular wall thickness. Right ventricular systolic pressure is moderately elevated with an estimated pressure of 49.8 mmHg. Left Atrium: left atrial size was moderately dilated Right Atrium: right atrial size was normal in size Interatrial Septum: No atrial level shunt detected by color flow Doppler.  Pericardium: There is no evidence of pericardial effusion. Mitral Valve: The mitral valve is degenerative in appearance. There is mild thickening. There is moderate mitral annular calcification present. Mitral valve regurgitation is mild by color flow Doppler. The MR jet is centrally-directed. mild mitral valve  stenosis. Tricuspid Valve: The tricuspid valve is normal in structure. Tricuspid valve regurgitation is mild by color flow Doppler. Aortic Valve: The aortic valve is tricuspid There is severe thickening of the aortic valve, with severely decreased cusp excursion. Aortic valve regurgitation is mild to moderate by color flow Doppler. The calculated  aortic valve area is 0.75 cm,  consistent with severe stenosis. Pulmonic Valve: The pulmonic valve was normal in structure. Pulmonic valve regurgitation is not visualized by color flow Doppler. Venous: The inferior vena cava is normal in size with greater than 50% respiratory variability.   LEFT VENTRICLE PLAX 2D (Teich) LV EF:          76.5 %   Diastology LVIDd:          3.14 cm  LV e'   lateral:   6.53 cm/s LVIDs:          1.76 cm  LV E/e' lateral: 25.1 LV PW:          1.46 cm  LV e' medial:    5.00 cm/s LV IVS:         1.71 cm  LV E/e' medial:  32.8 LVOT diam:      2.30 cm LV SV:          30 ml LVOT Area:      4.15 cm  RIGHT VENTRICLE RV S prime:     11.20 cm/s TAPSE (M-mode): 1.5 cm RVSP:           49.8 mmHg  LEFT ATRIUM              Index       RIGHT ATRIUM           Index LA diam:        4.90 cm  2.64 cm/m  RA Pressure: 3 mmHg LA Vol (A2C):   79.3 ml  42.72 ml/m RA Area:     17.10 cm LA Vol (A4C):   106.0 ml 57.11 ml/m RA Volume:   47.20 ml  25.43 ml/m LA Biplane Vol: 92.5 ml  49.84 ml/m  AORTIC VALVE AV Area (Vmax):    0.79 cm AV Area (Vmean):   0.71 cm AV Area (VTI):     0.75 cm AV Vmax:           500.00 cm/s AV Vmean:          351.200 cm/s AV VTI:            1.222 m AV Peak Grad:      100.0 mmHg AV Mean Grad:      57.8 mmHg LVOT Vmax:         94.50 cm/s LVOT Vmean:        59.900 cm/s LVOT VTI:          0.221 m LVOT/AV VTI ratio: 0.18 AR PHT:            445 msec   AORTA Ao Root diam: 3.00 cm Ao Asc diam:  3.50 cm  MITRAL VALVE               TR Peak grad: 46.8 mmHg MV Area (PHT): 2.82 cm    TR Vmax:      347.00 cm/s MV Peak grad:  16.5 mmHg   RVSP:         49.8 mmHg MV Mean grad:  5.0 mmHg MV Vmax:       2.03 m/s MV Vmean:      97.7 cm/s MV VTI:        0.46 m MV PHT:        78 msec MV Decel Time: 338 msec MR Peak grad: 199.4 mmHg MR Mean grad: 133.0 mmHg MR Vmax:      706.00 cm/s MR Vmean:     544.0 cm/s MR PISA:      1.01 MV E velocity:  164.00 cm/s  EKG:  EKG is not ordered today. The ekg from 10/09/18 shows atrial fibrillation  Recent Labs: No results found for requested labs within last 8760 hours.    Wt Readings from Last 3 Encounters:  10/15/18 203 lb (92.1 kg)  10/09/18 203 lb (92.1 kg)  04/28/17 189 lb 12.8 oz (86.1 kg)     Other studies Reviewed: Additional studies/ records that were reviewed   today include: echo images, office notes, EKG Review of the above records demonstrates: severe AS   Assessment and Plan:   1. Severe aortic valve stenosis: She has severe, stage D aortic valve stenosis. I have personally reviewed the echo images. The aortic valve is thickened, calcified with limited leaflet mobility. I think she would benefit from AVR. Given advanced age, she is not a good candidate for conventional AVR by surgical approach. I think she may be a good candidate for TAVR.   STS Risk Score: Risk of Mortality: 4.868%  Renal Failure: 6.049%  Permanent Stroke: 1.404%  Prolonged Ventilation: 15.695%  DSW Infection: 0.282%  Reoperation: 3.528%  Morbidity or Mortality: 24.729%  Short Length of Stay: 15.728%  Long Length of Stay: 14.245%  I have reviewed the natural history of aortic stenosis with the patient and their family members  who are present today. We have discussed the limitations of medical therapy and the poor prognosis associated with symptomatic aortic stenosis. We have reviewed potential treatment options, including palliative medical therapy, conventional surgical aortic valve replacement, and transcatheter aortic valve replacement. We discussed treatment options in the context of the patient's specific comorbid medical conditions.   She would like to proceed with planning for TAVR. I will arrange a right and left heart catheterization at [facility] 10/30/18. Risks and benefits of the cath procedure and the TAVR procedure are reviewed with the patient. After the cath, she will have a gated cardiac CTA,  CTA of the chest/abdomen and pelvis, carotid dopplers, PFTs, PT assessment and will then be referred to see one of the CT surgeons on our TAVR team.   We will assess her left atrial appendage for thrombus with the gated cardiac CT. If she has evidence of LAA thrombus, we may have to start a trial of anti-coagulation before proceeding with TAVR.    Current medicines are reviewed at length with the patient today.  The patient does not have concerns regarding medicines.  The following changes have been made:  no change  Labs/ tests ordered today include:   Orders Placed This Encounter  Procedures  . Basic Metabolic Panel (BMET)  . CBC     Disposition:   FU with the valve team.    Signed, Christopher McAlhany, MD 10/15/2018 9:51 AM    [facility] [address], [place], [place]  [address] Phone: [phone]; Fax: [phone]   

## 2018-10-15 ENCOUNTER — Encounter: Payer: Self-pay | Admitting: Cardiovascular Disease

## 2018-10-15 ENCOUNTER — Ambulatory Visit: Payer: Medicare HMO | Admitting: Cardiovascular Disease

## 2018-10-15 VITALS — BP 124/64 | HR 78 | Ht 59.0 in | Wt 203.0 lb

## 2018-10-15 DIAGNOSIS — I350 Nonrheumatic aortic (valve) stenosis: Secondary | ICD-10-CM | POA: Diagnosis not present

## 2018-10-15 LAB — BASIC METABOLIC PANEL
BUN/Creatinine Ratio: 23 (ref 12–28)
BUN: 19 mg/dL (ref 8–27)
CO2: 23 mmol/L (ref 20–29)
Calcium: 9.6 mg/dL (ref 8.7–10.3)
Chloride: 100 mmol/L (ref 96–106)
Creatinine, Ser: 0.83 mg/dL (ref 0.57–1.00)
GFR calc Af Amer: 79 mL/min/{1.73_m2} (ref >59)
GFR calc non Af Amer: 69 mL/min/{1.73_m2} (ref >59)
Glucose: 92 mg/dL (ref 65–99)
Potassium: 4.4 mmol/L (ref 3.5–5.2)
Sodium: 139 mmol/L (ref 134–144)

## 2018-10-15 LAB — CBC
Hematocrit: 37.6 % (ref 34.0–46.6)
Hemoglobin: 12.5 g/dL (ref 11.1–15.9)
MCH: 29.6 pg (ref 26.6–33.0)
MCHC: 33.2 g/dL (ref 31.5–35.7)
MCV: 89 fL (ref 79–97)
Platelets: 236 10*3/uL (ref 150–450)
RBC: 4.22 x10E6/uL (ref 3.77–5.28)
RDW: 15 % (ref 11.7–15.4)
WBC: 10.3 10*3/uL (ref 3.4–10.8)

## 2018-10-15 NOTE — Patient Instructions (Addendum)
Medication Instructions:  Your physician recommends that you continue on your current medications as directed. Please refer to the Current Medication list given to you today. See catheterization instructions below If you need a refill on your cardiac medications before your next appointment, please call your pharmacy.   Lab work: Lab work to be done today--BMP and CBC If you have labs (blood work) drawn today and your tests are completely normal, you will receive your results only by: Marland Kitchen MyChart Message (if you have MyChart) OR . A paper copy in the mail If you have any lab test that is abnormal or we need to change your treatment, we will call you to review the results.  Testing/Procedures: Your physician has requested that you have a cardiac catheterization. Cardiac catheterization is used to diagnose and/or treat various heart conditions. Doctors may recommend this procedure for a number of different reasons. The most common reason is to evaluate chest pain. Chest pain can be a symptom of coronary artery disease (CAD), and cardiac catheterization can show whether plaque is narrowing or blocking your heart's arteries. This procedure is also used to evaluate the valves, as well as measure the blood flow and oxygen levels in different parts of your heart. For further information please visit [email or web address] Please follow instruction sheet, as given.  Scheduled for 10/30/2018  Follow-Up:  To be arranged after catheterization.       [facility] CARDIOVASCULAR DIVISION [facility] OFFICE [address] [place] [address] Dept: [phone] Loc: [phone]  Taylor Jenkins  10/15/2018  You are scheduled for a Cardiac Catheterization on Thursday, February 27 with Dr. Verne Carrow.  1. Please arrive at the [facility] (Main Entrance A) at [facility]: [address] [place], [place] [address] at 5:30 AM (This time  is two hours before your procedure to ensure your preparation). Free valet parking service is available.   Special note: Every effort is made to have your procedure done on time. Please understand that emergencies sometimes delay scheduled procedures.  2. Diet: Do not eat solid foods after midnight.  The patient may have clear liquids until 5am upon the day of the procedure.  3. Labs: Lab work was done in office on 10/15/18.  4. Medication instructions in preparation for your procedure:   Contrast Allergy: No  Take half your normal insulin dose the evening before the procedure. Do not take any insulin or metformin the day of the procedure. Do not take metformin the day of procedure and for 48 hours after procedure. Do not take torsemide and potassium the morning of the procedure.       On the morning of your procedure, take Aspirin 81 mg and any morning medicines NOT listed above.  You may use sips of water.  5. Plan for one night stay--bring personal belongings. 6. Bring a current list of your medications and current insurance cards. 7. You MUST have a responsible person to drive you home. 8. Someone MUST be with you the first 24 hours after you arrive home or your discharge will be delayed. 9. Please wear clothes that are easy to get on and off and wear slip-on shoes.  Thank you for allowing [name] to care for you!   -- [facility]

## 2018-10-28 ENCOUNTER — Other Ambulatory Visit: Payer: Self-pay

## 2018-10-28 ENCOUNTER — Telehealth: Payer: Self-pay | Admitting: *Deleted

## 2018-10-28 DIAGNOSIS — I350 Nonrheumatic aortic (valve) stenosis: Secondary | ICD-10-CM

## 2018-10-28 DIAGNOSIS — R0609 Other forms of dyspnea: Secondary | ICD-10-CM

## 2018-10-28 DIAGNOSIS — R0600 Dyspnea, unspecified: Secondary | ICD-10-CM

## 2018-10-28 NOTE — Telephone Encounter (Signed)
Pt contacted pre-catheterization scheduled at [facility] for: Thursday February 27,2020 7:30 AM Verified arrival time and place: [facility] Main Entrance A at: 5:30 AM  No solid food after midnight prior to cath, clear liquids until 5 AM day of procedure. Contrast allergy: no  Hold: Metformin-day of procedure and 48 hours post procedure. Insulin-AM of procedure. 1/2 Insulin PM prior to procedure. Torsemide/KCl-AM of procedure  Except hold medications AM meds can be  taken pre-cath with sip of water including: ASA 81 mg  Confirmed patient has responsible person to drive home post procedure and observe 24 hours after arriving home: yes  I reviewed instructions with patient's daughter, Arline Asp, she verbalized understanding, thanked me for call. Arline Asp is aware that per Dr Clifton James and her request, I have placed orders for Iron/TIBC and CBC to be done at hospital morning of cath.

## 2018-10-29 ENCOUNTER — Encounter (HOSPITAL_COMMUNITY): Payer: Self-pay | Admitting: Cardiology

## 2018-10-30 ENCOUNTER — Ambulatory Visit (HOSPITAL_COMMUNITY)
Admission: RE | Admit: 2018-10-30 | Discharge: 2018-10-30 | Disposition: A | Discharge status: Home / Self Care | Payer: Medicare HMO | Source: Ambulatory Visit | Attending: Cardiovascular Disease | Admitting: Cardiovascular Disease

## 2018-10-30 ENCOUNTER — Encounter (HOSPITAL_COMMUNITY): Payer: Self-pay | Admitting: Cardiovascular Disease

## 2018-10-30 ENCOUNTER — Encounter (HOSPITAL_COMMUNITY)
Admission: RE | Disposition: A | Discharge status: Home / Self Care | Payer: Self-pay | Source: Ambulatory Visit | Attending: Cardiovascular Disease

## 2018-10-30 DIAGNOSIS — Z888 Allergy status to other drugs, medicaments and biological substances status: Secondary | ICD-10-CM | POA: Insufficient documentation

## 2018-10-30 DIAGNOSIS — I4891 Unspecified atrial fibrillation: Secondary | ICD-10-CM | POA: Insufficient documentation

## 2018-10-30 DIAGNOSIS — E785 Hyperlipidemia, unspecified: Secondary | ICD-10-CM | POA: Insufficient documentation

## 2018-10-30 DIAGNOSIS — Z79899 Other long term (current) drug therapy: Secondary | ICD-10-CM | POA: Diagnosis not present

## 2018-10-30 DIAGNOSIS — Z8249 Family history of ischemic heart disease and other diseases of the circulatory system: Secondary | ICD-10-CM | POA: Insufficient documentation

## 2018-10-30 DIAGNOSIS — Z794 Long term (current) use of insulin: Secondary | ICD-10-CM | POA: Diagnosis not present

## 2018-10-30 DIAGNOSIS — Z880 Allergy status to penicillin: Secondary | ICD-10-CM | POA: Diagnosis not present

## 2018-10-30 DIAGNOSIS — I5031 Acute diastolic (congestive) heart failure: Secondary | ICD-10-CM | POA: Diagnosis not present

## 2018-10-30 DIAGNOSIS — E119 Type 2 diabetes mellitus without complications: Secondary | ICD-10-CM | POA: Insufficient documentation

## 2018-10-30 DIAGNOSIS — Z882 Allergy status to sulfonamides status: Secondary | ICD-10-CM | POA: Diagnosis not present

## 2018-10-30 DIAGNOSIS — I110 Hypertensive heart disease with heart failure: Secondary | ICD-10-CM | POA: Insufficient documentation

## 2018-10-30 DIAGNOSIS — I060 Rheumatic aortic stenosis: Secondary | ICD-10-CM | POA: Insufficient documentation

## 2018-10-30 DIAGNOSIS — Z885 Allergy status to narcotic agent status: Secondary | ICD-10-CM | POA: Diagnosis not present

## 2018-10-30 DIAGNOSIS — Z7982 Long term (current) use of aspirin: Secondary | ICD-10-CM | POA: Insufficient documentation

## 2018-10-30 DIAGNOSIS — I350 Nonrheumatic aortic (valve) stenosis: Secondary | ICD-10-CM | POA: Diagnosis not present

## 2018-10-30 DIAGNOSIS — I2510 Atherosclerotic heart disease of native coronary artery without angina pectoris: Secondary | ICD-10-CM | POA: Diagnosis not present

## 2018-10-30 HISTORY — PX: RIGHT/LEFT HEART CATH AND CORONARY ANGIOGRAPHY: CATH118266

## 2018-10-30 LAB — POCT I-STAT 7, (LYTES, BLD GAS, ICA,H+H)
Acid-Base Excess: 2 mmol/L (ref 0.0–2.0)
Bicarbonate: 25.5 mmol/L (ref 20.0–28.0)
Calcium, Ion: 1.06 mmol/L — ABNORMAL LOW (ref 1.15–1.40)
HCT: 34 % — ABNORMAL LOW (ref 36.0–46.0)
Hemoglobin: 11.6 g/dL — ABNORMAL LOW (ref 12.0–15.0)
O2 Saturation: 99 %
Potassium: 3.5 mmol/L (ref 3.5–5.1)
Sodium: 142 mmol/L (ref 135–145)
TCO2: 27 mmol/L (ref 22–32)
pCO2 arterial: 36.7 mmHg (ref 32.0–48.0)
pH, Arterial: 7.45 (ref 7.350–7.450)
pO2, Arterial: 110 mmHg — ABNORMAL HIGH (ref 83.0–108.0)

## 2018-10-30 LAB — IRON AND TIBC
Iron: 70 ug/dL (ref 28–170)
Saturation Ratios: 19 % (ref 10.4–31.8)
TIBC: 378 ug/dL (ref 250–450)
UIBC: 308 ug/dL

## 2018-10-30 LAB — POCT I-STAT EG7
Bicarbonate: 25.2 mmol/L (ref 20.0–28.0)
Calcium, Ion: 0.92 mmol/L — ABNORMAL LOW (ref 1.15–1.40)
HCT: 32 % — ABNORMAL LOW (ref 36.0–46.0)
Hemoglobin: 10.9 g/dL — ABNORMAL LOW (ref 12.0–15.0)
O2 Saturation: 66 %
Potassium: 3.1 mmol/L — ABNORMAL LOW (ref 3.5–5.1)
Sodium: 146 mmol/L — ABNORMAL HIGH (ref 135–145)
TCO2: 26 mmol/L (ref 22–32)
pCO2, Ven: 42 mmHg — ABNORMAL LOW (ref 44.0–60.0)
pH, Ven: 7.387 (ref 7.250–7.430)
pO2, Ven: 35 mmHg (ref 32.0–45.0)

## 2018-10-30 LAB — GLUCOSE, CAPILLARY
Glucose-Capillary: 143 mg/dL — ABNORMAL HIGH (ref 70–99)
Glucose-Capillary: 135 mg/dL — ABNORMAL HIGH (ref 70–99)

## 2018-10-30 LAB — CBC
HCT: 40.1 % (ref 36.0–46.0)
Hemoglobin: 12.7 g/dL (ref 12.0–15.0)
MCH: 29.5 pg (ref 26.0–34.0)
MCHC: 31.7 g/dL (ref 30.0–36.0)
MCV: 93.3 fL (ref 80.0–100.0)
Platelets: 249 10*3/uL (ref 150–400)
RBC: 4.3 MIL/uL (ref 3.87–5.11)
RDW: 15.6 % — ABNORMAL HIGH (ref 11.5–15.5)
WBC: 8.4 10*3/uL (ref 4.0–10.5)
nRBC: 0 % (ref 0.0–0.2)

## 2018-10-30 SURGERY — RIGHT/LEFT HEART CATH AND CORONARY ANGIOGRAPHY
Anesthesia: LOCAL

## 2018-10-30 MED ORDER — LIDOCAINE HCL (PF) 1 % IJ SOLN
INTRAMUSCULAR | Status: AC
  Filled 2018-10-30: qty 30

## 2018-10-30 MED ORDER — SODIUM CHLORIDE 0.9 % IV SOLN: 250.0000 mL | INTRAVENOUS | Status: DC | PRN

## 2018-10-30 MED ORDER — HEPARIN (PORCINE) IN NACL 1000-0.9 UT/500ML-% IV SOLN
INTRAVENOUS | Status: DC | PRN
  Administered 2018-10-30 (×2): 500 mL

## 2018-10-30 MED ORDER — FENTANYL CITRATE (PF) 100 MCG/2ML IJ SOLN
INTRAMUSCULAR | Status: AC
  Filled 2018-10-30: qty 2

## 2018-10-30 MED ORDER — HEPARIN SODIUM (PORCINE) 1000 UNIT/ML IJ SOLN
INTRAMUSCULAR | Status: DC | PRN
  Administered 2018-10-30: 4500 [IU] via INTRAVENOUS

## 2018-10-30 MED ORDER — IOHEXOL 350 MG/ML SOLN
INTRAVENOUS | Status: DC | PRN
  Administered 2018-10-30: 40 mL

## 2018-10-30 MED ORDER — HEPARIN SODIUM (PORCINE) 1000 UNIT/ML IJ SOLN
INTRAMUSCULAR | Status: AC
  Filled 2018-10-30: qty 1

## 2018-10-30 MED ORDER — SODIUM CHLORIDE 0.9 % IV SOLN
INTRAVENOUS | Status: AC
  Administered 2018-10-30: 07:00:00 via INTRAVENOUS

## 2018-10-30 MED ORDER — HEPARIN (PORCINE) IN NACL 1000-0.9 UT/500ML-% IV SOLN
INTRAVENOUS | Status: AC
  Filled 2018-10-30: qty 1000

## 2018-10-30 MED ORDER — MIDAZOLAM HCL 2 MG/2ML IJ SOLN
INTRAMUSCULAR | Status: AC
  Filled 2018-10-30: qty 2

## 2018-10-30 MED ORDER — FENTANYL CITRATE (PF) 100 MCG/2ML IJ SOLN
INTRAMUSCULAR | Status: DC | PRN
  Administered 2018-10-30: 25 ug via INTRAVENOUS

## 2018-10-30 MED ORDER — LIDOCAINE HCL (PF) 1 % IJ SOLN
INTRAMUSCULAR | Status: DC | PRN
  Administered 2018-10-30 (×2): 2 mL

## 2018-10-30 MED ORDER — ASPIRIN 81 MG PO CHEW: 81.0000 mg | CHEWABLE_TABLET | ORAL | Status: DC

## 2018-10-30 MED ORDER — MIDAZOLAM HCL 2 MG/2ML IJ SOLN
INTRAMUSCULAR | Status: DC | PRN
  Administered 2018-10-30: 1 mg via INTRAVENOUS

## 2018-10-30 MED ORDER — SODIUM CHLORIDE 0.9% FLUSH: 3.0000 mL | Freq: Two times a day (BID) | INTRAVENOUS | Status: DC

## 2018-10-30 MED ORDER — VERAPAMIL HCL 2.5 MG/ML IV SOLN
INTRAVENOUS | Status: DC | PRN
  Administered 2018-10-30: 10 mL via INTRA_ARTERIAL

## 2018-10-30 MED ORDER — BUPIVACAINE HCL (PF) 0.25 % IJ SOLN
INTRAMUSCULAR | Status: AC
  Filled 2018-10-30: qty 30

## 2018-10-30 MED ORDER — VERAPAMIL HCL 2.5 MG/ML IV SOLN
INTRAVENOUS | Status: AC
  Filled 2018-10-30: qty 2

## 2018-10-30 MED ORDER — SODIUM CHLORIDE 0.9% FLUSH: 3.0000 mL | INTRAVENOUS | Status: DC | PRN

## 2018-10-30 SURGICAL SUPPLY — 13 items
CATH 5FR JL3.5 JR4 ANG PIG MP (CATHETERS) ×2 IMPLANT
CATH BALLN WEDGE 5F 110CM (CATHETERS) ×2 IMPLANT
CATH INFINITI 5FR AL1 (CATHETERS) ×2 IMPLANT
DEVICE RAD COMP TR BAND LRG (VASCULAR PRODUCTS) ×2 IMPLANT
GLIDESHEATH SLEND SS 6F .021 (SHEATH) ×2 IMPLANT
GUIDEWIRE INQWIRE 1.5J.035X260 (WIRE) ×1 IMPLANT
INQWIRE 1.5J .035X260CM (WIRE) ×2
KIT HEART LEFT (KITS) ×2 IMPLANT
PACK CARDIAC CATHETERIZATION (CUSTOM PROCEDURE TRAY) ×2 IMPLANT
SHEATH GLIDE SLENDER 4/5FR (SHEATH) ×2 IMPLANT
TRANSDUCER W/STOPCOCK (MISCELLANEOUS) ×2 IMPLANT
TUBING CIL FLEX 10 FLL-RA (TUBING) ×2 IMPLANT
WIRE EMERALD ST .035X150CM (WIRE) ×2 IMPLANT

## 2018-10-30 NOTE — Interval H&P Note (Signed)
History and Physical Interval Note:  10/30/2018 7:05 AM  [name]  has presented today for cardiac cath with the diagnosis of severe aortic stenosis.  The various methods of treatment have been discussed with the patient and family. After consideration of risks, benefits and other options for treatment, the patient has consented to  Procedure(s): RIGHT/LEFT HEART CATH AND CORONARY ANGIOGRAPHY (N/A) as a surgical intervention .  The patient's history has been reviewed, patient examined, no change in status, stable for surgery.  I have reviewed the patient's chart and labs.  Questions were answered to the patient's satisfaction.    Cath Lab Visit (complete for each Cath Lab visit)  Clinical Evaluation Leading to the Procedure:   ACS: No.  Non-ACS:    Anginal Classification: CCS II  Anti-ischemic medical therapy: Minimal Therapy (1 class of medications)  Non-Invasive Test Results: No non-invasive testing performed  Prior CABG: No previous CABG         Verne Carrow

## 2018-10-30 NOTE — Discharge Instructions (Signed)
°NO METFORMIN/GLUCOPHAGE FOR 2 DAYS ° ° ° °Radial Site Care ° °This sheet gives you information about how to care for yourself after your procedure. Your health care provider may also give you more specific instructions. If you have problems or questions, contact your health care provider. °What can I expect after the procedure? °After the procedure, it is common to have: °· Bruising and tenderness at the catheter insertion area. °Follow these instructions at home: °Medicines °· Take over-the-counter and prescription medicines only as told by your health care provider. °Insertion site care °· Follow instructions from your health care provider about how to take care of your insertion site. Make sure you: °? Wash your hands with soap and water before you change your bandage (dressing). If soap and water are not available, use hand sanitizer. °? Change your dressing as told by your health care provider. °? Leave stitches (sutures), skin glue, or adhesive strips in place. These skin closures may need to stay in place for 2 weeks or longer. If adhesive strip edges start to loosen and curl up, you may trim the loose edges. Do not remove adhesive strips completely unless your health care provider tells you to do that. °· Check your insertion site every day for signs of infection. Check for: °? Redness, swelling, or pain. °? Fluid or blood. °? Pus or a bad smell. °? Warmth. °· Do not take baths, swim, or use a hot tub until your health care provider approves. °· You may shower 24-48 hours after the procedure, or as directed by your health care provider. °? Remove the dressing and gently wash the site with plain soap and water. °? Pat the area dry with a clean towel. °? Do not rub the site. That could cause bleeding. °· Do not apply powder or lotion to the site. °Activity ° °· For 24 hours after the procedure, or as directed by your health care provider: °? Do not flex or bend the affected arm. °? Do not push or pull heavy  objects with the affected arm. °? Do not drive yourself home from the hospital or clinic. You may drive 24 hours after the procedure unless your health care provider tells you not to. °? Do not operate machinery or power tools. °· Do not lift anything that is heavier than 10 lb (4.5 kg), or the limit that you are told, until your health care provider says that it is safe. °· Ask your health care provider when it is okay to: °? Return to work or school. °? Resume usual physical activities or sports. °? Resume sexual activity. °General instructions °· If the catheter site starts to bleed, raise your arm and put firm pressure on the site. If the bleeding does not stop, get help right away. This is a medical emergency. °· If you went home on the same day as your procedure, a responsible adult should be with you for the first 24 hours after you arrive home. °· Keep all follow-up visits as told by your health care provider. This is important. °Contact a health care provider if: °· You have a fever. °· You have redness, swelling, or yellow drainage around your insertion site. °Get help right away if: °· You have unusual pain at the radial site. °· The catheter insertion area swells very fast. °· The insertion area is bleeding, and the bleeding does not stop when you hold steady pressure on the area. °· Your arm or hand becomes pale, cool, tingly, or   numb. °These symptoms may represent a serious problem that is an emergency. Do not wait to see if the symptoms will go away. Get medical help right away. Call your local emergency services (911 in the [place] Do not drive yourself to the hospital. °Summary °· After the procedure, it is common to have bruising and tenderness at the site. °· Follow instructions from your health care provider about how to take care of your radial site wound. Check the wound every day for signs of infection. °· Do not lift anything that is heavier than 10 lb (4.5 kg), or the limit that you are told,  until your health care provider says that it is safe. °This information is not intended to replace advice given to you by your health care provider. Make sure you discuss any questions you have with your health care provider. °Document Released: 09/22/2010 Document Revised: 09/25/2017 Document Reviewed: 09/25/2017 °Elsevier Interactive Patient Education © 2019 [employer] ° ° ° °Moderate Conscious Sedation, Adult, Care After °These instructions provide you with information about caring for yourself after your procedure. Your health care provider may also give you more specific instructions. Your treatment has been planned according to current medical practices, but problems sometimes occur. Call your health care provider if you have any problems or questions after your procedure. °What can I expect after the procedure? °After your procedure, it is common: °· To feel sleepy for several hours. °· To feel clumsy and have poor balance for several hours. °· To have poor judgment for several hours. °· To vomit if you eat too soon. °Follow these instructions at home: °For at least 24 hours after the procedure: ° °· Do not: °? Participate in activities where you could fall or become injured. °? Drive. °? Use heavy machinery. °? Drink alcohol. °? Take sleeping pills or medicines that cause drowsiness. °? Make important decisions or sign legal documents. °? Take care of children on your own. °· Rest. °Eating and drinking °· Follow the diet recommended by your health care provider. °· If you vomit: °? Drink water, juice, or soup when you can drink without vomiting. °? Make sure you have little or no nausea before eating solid foods. °General instructions °· Have a responsible adult stay with you until you are awake and alert. °· Take over-the-counter and prescription medicines only as told by your health care provider. °· If you smoke, do not smoke without supervision. °· Keep all follow-up visits as told by your health care  provider. This is important. °Contact a health care provider if: °· You keep feeling nauseous or you keep vomiting. °· You feel light-headed. °· You develop a rash. °· You have a fever. °Get help right away if: °· You have trouble breathing. °This information is not intended to replace advice given to you by your health care provider. Make sure you discuss any questions you have with your health care provider. °Document Released: 06/10/2013 Document Revised: 01/23/2016 Document Reviewed: 12/10/2015 °Elsevier Interactive Patient Education © 2019 [employer] ° °

## 2018-11-04 ENCOUNTER — Telehealth (HOSPITAL_COMMUNITY): Payer: Self-pay | Admitting: Emergency Medicine

## 2018-11-04 NOTE — Telephone Encounter (Signed)
Created encounter by error.

## 2018-11-05 ENCOUNTER — Other Ambulatory Visit: Payer: Self-pay

## 2018-11-05 ENCOUNTER — Ambulatory Visit (HOSPITAL_COMMUNITY)
Admission: RE | Admit: 2018-11-05 | Discharge: 2018-11-05 | Disposition: A | Discharge status: Home / Self Care | Payer: Medicare HMO | Source: Ambulatory Visit | Attending: Cardiovascular Disease | Admitting: Cardiovascular Disease

## 2018-11-05 ENCOUNTER — Encounter: Payer: Self-pay | Admitting: Physical Therapy

## 2018-11-05 ENCOUNTER — Ambulatory Visit: Payer: Medicare HMO | Attending: Cardiovascular Disease | Admitting: Physical Therapy

## 2018-11-05 ENCOUNTER — Ambulatory Visit (HOSPITAL_BASED_OUTPATIENT_CLINIC_OR_DEPARTMENT_OTHER)
Admission: RE | Admit: 2018-11-05 | Discharge: 2018-11-05 | Disposition: A | Discharge status: Home / Self Care | Payer: Medicare HMO | Source: Ambulatory Visit | Attending: Cardiovascular Disease | Admitting: Cardiovascular Disease

## 2018-11-05 DIAGNOSIS — R0609 Other forms of dyspnea: Secondary | ICD-10-CM | POA: Diagnosis present

## 2018-11-05 DIAGNOSIS — R0600 Dyspnea, unspecified: Secondary | ICD-10-CM

## 2018-11-05 DIAGNOSIS — I350 Nonrheumatic aortic (valve) stenosis: Secondary | ICD-10-CM

## 2018-11-05 DIAGNOSIS — R262 Difficulty in walking, not elsewhere classified: Secondary | ICD-10-CM | POA: Diagnosis not present

## 2018-11-05 LAB — PULMONARY FUNCTION TEST
DL/VA % pred: 85 %
DL/VA: 3.69 ml/min/mmHg/L
DLCO cor % pred: 64 %
DLCO cor: 9.57 ml/min/mmHg
DLCO unc % pred: 63 %
DLCO unc: 9.36 ml/min/mmHg
FEF 25-75 Pre: 1 L/sec
FEF2575-%Pred-Pre: 81 %
FEV1-%Pred-Pre: 89 %
FEV1-Pre: 1.29 L
FEV1FVC-%Pred-Pre: 104 %
FEV6-%Pred-Pre: 89 %
FEV6-Pre: 1.66 L
FEV6FVC-%Pred-Pre: 106 %
FVC-%Pred-Pre: 84 %
FVC-Pre: 1.66 L
Pre FEV1/FVC ratio: 78 %
Pre FEV6/FVC Ratio: 100 %
RV % pred: 112 %
RV: 2.21 L
TLC % pred: 102 %
TLC: 4.09 L

## 2018-11-05 MED ORDER — IOHEXOL 350 MG/ML SOLN
120.0000 mL | Freq: Once | INTRAVENOUS | Status: AC | PRN
  Administered 2018-11-05: 120 mL via INTRAVENOUS

## 2018-11-05 NOTE — Therapy (Signed)
[facility] [address] [place], [place], [address] Phone: [phone]   Fax:  [phone]  Physical Therapy Treatment/Pre-TAVR  Patient Details  Name: Taylor Jenkins MRN: [id number] Date of Birth: 12-07-1940 Referring Provider (PT): Kathleene Hazel, MD   Encounter Date: 11/05/2018  PT End of Session - 11/05/18 1341    Visit Number  1    PT Start Time  1330    PT Stop Time  1424    PT Time Calculation (min)  54 min    Activity Tolerance  Patient tolerated treatment well;Patient limited by fatigue;Treatment limited secondary to medical complications (Comment)    Behavior During Therapy  [facility] for tasks assessed/performed       Past Medical History:  Diagnosis Date  . Acute diastolic CHF (congestive heart failure) (HCC) 09/25/2014  . Anemia   . Aortic atherosclerosis (HCC) 04/29/2017  . CAD (coronary artery disease) 2016   non-obstructive at cath  . Diabetes mellitus without complication (HCC)    type 2  . Hyperlipidemia associated with type 2 diabetes mellitus (HCC)   . Hypertension   . New onset atrial fibrillation (HCC)   . Severe aortic stenosis 04/26/2017    Past Surgical History:  Procedure Laterality Date  . LEFT HEART CATHETERIZATION WITH CORONARY ANGIOGRAM N/A 09/27/2014   Non-obstructive disease, CRF reduction recommended. Procedure: LEFT HEART CATHETERIZATION WITH CORONARY ANGIOGRAM;  Surgeon: Micheline Chapman, MD;  Location: [facility] CATH LAB;  Service: Cardiovascular;  Laterality: N/A;  . RIGHT/LEFT HEART CATH AND CORONARY ANGIOGRAPHY N/A 10/30/2018   Procedure: RIGHT/LEFT HEART CATH AND CORONARY ANGIOGRAPHY;  Surgeon: Kathleene Hazel, MD;  Location: [facility] INVASIVE CV LAB;  Service: Cardiovascular;  Laterality: N/A;    There were no vitals filed for this visit.  Subjective Assessment - 11/05/18 1337    Subjective  Used to clean homes aafter retirement. Slowly started slowing down but just in the last couple  of years walking short distances at home takes everything out of me. Stairs on back deck. Animals at home. Reports a lot of back pain and Rt ankle pain. Checks on neighbor and helps around his house. Requires assistance for larger grocery shopping.     How long can you walk comfortably?  about 50 feet    Currently in Pain?  Yes    Pain Score  4     Pain Location  Back    Pain Orientation  Lower;Left    Pain Descriptors / Indicators  Throbbing    Pain Type  Chronic pain    Pain Frequency  Constant    Aggravating Factors   vaccuuming, making bed    Pain Relieving Factors  occasional meds, just ignore it         [facility] PT Assessment - 11/05/18 0001      Assessment   Medical Diagnosis  severe aortic stenosis    Referring Provider (PT)  Kathleene Hazel, MD    Onset Date/Surgical Date  11/18/18    Hand Dominance  Right    Prior Therapy  no      Precautions   Precautions  None      Restrictions   Weight Bearing Restrictions  No      Balance Screen   Has the patient fallen in the past 6 months  No      Home Environment   Living Environment  Private residence    Living Arrangements  Alone   animals     Prior Function  Level of Independence  Independent;Independent with household mobility without device    Leisure  caring for animals      Cognition   Overall Cognitive Status  Within Functional Limits for tasks assessed      Sensation   Additional Comments  WFL      Posture/Postural Control   Posture Comments  mild rounding of shoulders      ROM / Strength   AROM / PROM / Strength  AROM;Strength      AROM   Overall AROM Comments  WFL      Strength   Overall Strength Comments  gross 5/5    Strength Assessment Site  Hand    Right/Left hand  Right;Left    Right Hand Grip (lbs)  45    Left Hand Grip (lbs)  40       [facility] Pre-Surgical Assessment - 11/05/18 0001    5 Meter Walk Test- trial 1  4 sec    5 Meter Walk Test- trial 2  4 sec.     5 Meter Walk Test-  trial 3  5 sec.    5 meter walk test average  4.33 sec    4 Stage Balance Test Position  1    Sit To Stand Test- trial 1  15 sec.    Other comment  7/12    6 Minute Walk- Baseline  yes    BP (mmHg)  136/73   97% 78bpm 132/79  standing   HR (bpm)  74    02 Sat (%RA)  97 %    Modified Borg Scale for Dyspnea  0- Nothing at all    Perceived Rate of Exertion (Borg)  6-    6 Minute Walk Post Test  yes    BP (mmHg)  (!) 166/100    HR (bpm)  83    02 Sat (%RA)  89 %    Modified Borg Scale for Dyspnea  4- somewhat severe    Perceived Rate of Exertion (Borg)  15- Hard    Aerobic Endurance Distance Walked  585    Endurance additional comments  62% disability compared to age-related normative values                                     Plan - 11/05/18 1439    PT Frequency  --   TAVR one-time evaluation   Consulted and Agree with Plan of Care  Patient;Family member/caregiver    Family Member Consulted  Daughter      Clinical Impression Statement: Pt is a 78 yo F presenting to OP PT for evaluation prior to possible TAVR surgery due to severe aortic stenosis. Pt reports onset of SOB a couple of years ago ago. Symptoms are  limiting ambulation in community and in-home distances. Pt presents with WFL ROM and strength and reports low back and Rt ankle musculoskeletal pain.  Pt ambulated 250 feet in 1:20 before requesting a standing rest beak lasting 1 min. At time of rest, patient's HR was 92 bpm and O2 was 89% on room air. Pt reported 3/10 shortness of breath on modified scale for dyspnea. Pt ambulated 185 feet in 1:10 before requesting a standing rest beak lasting 1:15 min. At time of rest, patient's HR was 110 bpm and O2 was 86% on room air. Pt reported 4/10 shortness of breath on modified scale for dyspnea Pt able to resume  after rest and ambulate an additional 155 feet.Pt ambulated a total of 585 feet in 6 minute walk and reported 4/10 SOB on modified scale for dyspena  and 15/20 RPE on Borg's perceived exertion and pain scale at the end of the walk. Based on the Short Physical Performance Battery, patient has a frailty rating of 7/12 with </= 5/12 considered frail.  Visit Diagnosis: Difficulty in walking, not elsewhere classified     Problem List Patient Active Problem List   Diagnosis Date Noted  . Severe aortic stenosis   . Aortic atherosclerosis (HCC) 04/29/2017  . Obesity (BMI 30-39.9) 04/29/2017  . Palliative care by specialist   . Aortic valve stenosis 04/26/2017  . New onset atrial fibrillation (HCC)   . Symptomatic anemia 04/25/2017  . Acute blood loss anemia 04/24/2017  . Melena 04/24/2017  . Right knee injury 08/03/2015  . CAD- mild, non obstructive by cath 09/27/14 09/28/2014  . Statin intolerance 09/28/2014  . Elevated troponin   . Demand ischemia (HCC) 09/25/2014  . Acute diastolic CHF (congestive heart failure) (HCC) 09/25/2014  . Acute cardiogenic pulmonary edema (HCC) 09/25/2014  . IDDM (insulin dependent diabetes mellitus) (HCC) 09/25/2014  . HTN (hypertension) 09/25/2014  . HLD (hyperlipidemia) 09/25/2014   Shalev Helminiak C. Jams Trickett PT, DPT 11/05/18 2:43 PM   [facility] [address] [place], [place], [address] Phone: [phone]   Fax:  [phone]  Name: Moncerrath Cass MRN: [id number] Date of Birth: 16-May-1941

## 2018-11-12 ENCOUNTER — Other Ambulatory Visit: Payer: Self-pay

## 2018-11-12 ENCOUNTER — Institutional Professional Consult (permissible substitution) (INDEPENDENT_AMBULATORY_CARE_PROVIDER_SITE_OTHER): Payer: Medicare HMO | Admitting: Surgery

## 2018-11-12 ENCOUNTER — Encounter: Payer: Self-pay | Admitting: Surgery

## 2018-11-12 VITALS — BP 124/80 | HR 79 | Resp 16 | Ht <= 58 in | Wt 206.0 lb

## 2018-11-12 DIAGNOSIS — I350 Nonrheumatic aortic (valve) stenosis: Secondary | ICD-10-CM | POA: Diagnosis not present

## 2018-11-12 DIAGNOSIS — I4891 Unspecified atrial fibrillation: Secondary | ICD-10-CM

## 2018-11-12 NOTE — Anesthesia Preprocedure Evaluation (Addendum)
Anesthesia Evaluation  Patient identified by MRN, date of birth, ID band Patient awake    Reviewed: Allergy & Precautions, NPO status , Patient's Chart, lab work & pertinent test results  History of Anesthesia Complications Negative for: history of anesthetic complications  Airway Mallampati: IV  TM Distance: >3 FB Neck ROM: Full    Dental  (+) Dental Advisory Given, Teeth Intact   Pulmonary COPD (last inhaler needed 2 weeks ago),  COPD inhaler,    breath sounds clear to auscultation       Cardiovascular hypertension, Pt. on medications (-) angina+ CAD (non-obstructive)  + dysrhythmias Atrial Fibrillation + Valvular Problems/Murmurs (severe ) AS  Rhythm:Irregular Rate:Normal  10/2018 cath: Mild to moderate non-obstructive CAD  LAD has a moderate stenosis. Mild to moderate ostial Circumflex stenosis. Large dominant RCA with luminal irregularities. Severe AS (mean gradient 43 mmHg, peak to peak gradient 65 mmhg, AVA 0.64 cm2).   2/20 ECHO: EF 60-65%, mild MS with mild MR, severe AS   Neuro/Psych negative neurological ROS     GI/Hepatic (+) Cirrhosis       , history of GI bleeding due to gastric AVMs   Endo/Other  diabetes (glu 151), Insulin Dependent, Oral Hypoglycemic AgentsMorbid obesity  Renal/GU      Musculoskeletal   Abdominal (+) + obese,   Peds  Hematology  (+) Blood dyscrasia (Hb 10.3), anemia ,   Anesthesia Other Findings   Reproductive/Obstetrics                           Anesthesia Physical Anesthesia Plan  ASA: III  Anesthesia Plan: General   Post-op Pain Management:    Induction: Intravenous  PONV Risk Score and Plan: 3 and Ondansetron, Dexamethasone and Treatment may vary due to age or medical condition  Airway Management Planned: Oral ETT and Video Laryngoscope Planned  Additional Equipment: Arterial line  Intra-op Plan:   Post-operative Plan: Extubation in  OR  Informed Consent: I have reviewed the patients History and Physical, chart, labs and discussed the procedure including the risks, benefits and alternatives for the proposed anesthesia with the patient or authorized representative who has indicated his/her understanding and acceptance.     Dental advisory given  Plan Discussed with: CRNA and Surgeon  Anesthesia Plan Comments: (Per TAVR team, may need general anesthesia. Shonna Chock, PA-C (11/12/2018) Plan routine monitors, A-line, GETA )      Anesthesia Quick Evaluation

## 2018-11-12 NOTE — Progress Notes (Signed)
Patient ID: Taylor Jenkins, female   DOB: 1941-06-01, 78 y.o.   MRN: [id number]  [facility] CONSULTATION REPORT  Referring Provider is Hilty, Nadean Corwin, MD Primary Cardiologist is No primary care provider on file. PCP is Rubie Maid, MD  Chief Complaint  Patient presents with   Aortic Stenosis    eval for TAVR...all required studies have been completed    HPI:  The patient is a 78 year old woman with history of type 2 diabetes, hyperlipidemia, hypertension, diastolic congestive heart failure, coronary disease, atrial fibrillation, and severe aortic stenosis who was referred for evaluation for transcatheter aortic valve replacement.  Her aortic stenosis had been followed for several years in [facility] by Dr. Darral Dash.  She said that she was having symptoms but nothing was being done.   she was evaluated in [place] and was seen by Dr. Radford Pax.  She was admitted to [facility] in August 2018 with marked anemia with a hemoglobin of 6.6, heme positive stools, and congestive heart failure.  She did have an echocardiogram done here in August 2018 which showed severe aortic stenosis with a mean gradient of 49 mmHg and a peak of 77 mmHg.  Aortic valve area at that time was 0.65 cm.  Left ventricular ejection fraction was normal.  She was transfused and felt better.  GI was consulted and endoscopic evaluation was recommended.  She subsequently underwent outpatient upper endoscopy by Dr. Collene Mares which showed AVMs which were apparently clipped.  She then went back for cardiology follow-up in [facility] and continued to have fatigue and exertional shortness of breath.  She eventually self referred to Dr. Debara Pickett who she saw last month.  A follow-up echocardiogram on 10/13/2018 showed severe aortic stenosis with a mean gradient of 58 mmHg and aortic valve area of 0.75 cm.  There is moderate aortic insufficiency.  Left ventricular  ejection fraction is 60-65%.  There is moderate left ventricular hypertrophy.  She underwent cardiac catheterization on 10/30/2018 which showed mild to moderate nonobstructive coronary disease with about 30-40% mid LAD stenosis which was unchanged from prior catheterization in 2016.  There is also about 40% ostial proximal left circumflex stenosis.  The mean gradient across aortic valve was 43 mmHg with a peak to peak gradient of 65 mmHg and a valve area of 0.64 cm.  PA pressure was 37/19 with a mean of 23.  The patient is here with HER-2 daughters today who are nurses.  One works on [employer] and the other works in [employer] in the emergency room.  She is widowed and lives alone in [place].  She had previously been very active until the past year when she has noted a progressive downhill course with exertional fatigue and shortness of breath.  She has had no dizziness or syncope.  She does have orthopnea and peripheral edema.  Past Medical History:  Diagnosis Date   Acute diastolic CHF (congestive heart failure) ([place]) 09/25/2014   Anemia    Aortic atherosclerosis ([place]) 04/29/2017   CAD (coronary artery disease) 2016   non-obstructive at cath   Diabetes mellitus without complication ([place])    type 2   Hyperlipidemia associated with type 2 diabetes mellitus ([place])    Hypertension    New onset atrial fibrillation ([facility])    Severe aortic stenosis 04/26/2017    Past Surgical History:  Procedure Laterality Date   LEFT HEART CATHETERIZATION WITH CORONARY ANGIOGRAM N/A 09/27/2014  Non-obstructive disease, CRF reduction recommended. Procedure: LEFT HEART CATHETERIZATION WITH CORONARY ANGIOGRAM;  Surgeon: Blane Ohara, MD;  Location: [facility] CATH LAB;  Service: Cardiovascular;  Laterality: N/A;   RIGHT/LEFT HEART CATH AND CORONARY ANGIOGRAPHY N/A 10/30/2018   Procedure: RIGHT/LEFT HEART CATH AND CORONARY ANGIOGRAPHY;  Surgeon: Burnell Blanks, MD;  Location: [facility] CV LAB;  Service: Cardiovascular;  Laterality: N/A;    Family History  Problem Relation Age of Onset   Transient ischemic attack Mother    Heart attack Brother 36    Social History   Socioeconomic History   Marital status: Widowed    Spouse name: Not on file   Number of children: 4   Years of education: Not on file   Highest education level: Not on file  Occupational History   Occupation: Retired-Secretary  Social Needs   Financial resource strain: Not on file   Food insecurity:    Worry: Not on file    Inability: Not on file   Transportation needs:    Medical: Not on file    Non-medical: Not on file  Tobacco Use   Smoking status: Never Smoker   Smokeless tobacco: Never Used  Substance and Sexual Activity   Alcohol use: No    Alcohol/week: 0.0 standard drinks   Drug use: No   Sexual activity: Not on file  Lifestyle   Physical activity:    Days per week: Not on file    Minutes per session: Not on file   Stress: Not on file  Relationships   Social connections:    Talks on phone: Not on file    Gets together: Not on file    Attends religious service: Not on file    Active member of club or organization: Not on file    Attends meetings of clubs or organizations: Not on file    Relationship status: Not on file   Intimate partner violence:    Fear of current or ex partner: Not on file    Emotionally abused: Not on file    Physically abused: Not on file    Forced sexual activity: Not on file  Other Topics Concern   Not on file  Social History Narrative   Pt lives alone in [place].     Current Outpatient Medications  Medication Sig Dispense Refill   acetaminophen (TYLENOL) 500 MG tablet Take 500 mg by mouth every 6 (six) hours as needed for moderate pain or headache.     allopurinol (ZYLOPRIM) 100 MG tablet Take 100 mg by mouth daily.     aspirin EC 81 MG tablet Take 81 mg by mouth daily.     calcium carbonate (TUMS EX) 750 MG  chewable tablet Chew 1 tablet by mouth daily as needed for heartburn.     ferrous sulfate 325 (65 FE) MG tablet Take 325 mg by mouth daily.     insulin NPH Human (HUMULIN N,NOVOLIN N) 100 UNIT/ML injection Inject 15 Units into the skin 2 (two) times daily.      levalbuterol (XOPENEX HFA) 45 MCG/ACT inhaler Inhale 2 puffs into the lungs every 4 (four) hours as needed for wheezing. 1 Inhaler 2   losartan (COZAAR) 25 MG tablet Take 25 mg by mouth daily.     metFORMIN (GLUCOPHAGE) 1000 MG tablet Take 1,000 mg by mouth 2 (two) times daily with a meal.     metoprolol tartrate (LOPRESSOR) 50 MG tablet Take 50 mg by mouth 2 (two) times daily.  nitroGLYCERIN (NITROSTAT) 0.4 MG SL tablet Place 1 tablet (0.4 mg total) under the tongue every 5 (five) minutes x 3 doses as needed for chest pain (or severe SOB). 25 tablet 2   potassium chloride (MICRO-K) 10 MEQ CR capsule Take 2 capsules (20 mEq total) by mouth daily. (Patient taking differently: Take 10 mEq by mouth daily. ) 60 capsule 2   Prenatal Vit-Fe Fumarate-FA (PRENATAL PO) Take 1 tablet by mouth daily.     torsemide (DEMADEX) 10 MG tablet Take 10 mg by mouth 2 (two) times daily.      No current facility-administered medications for this visit.     Allergies  Allergen Reactions   Codeine Shortness Of Breath and Itching   Epinephrine Shortness Of Breath and Palpitations   Penicillins Anaphylaxis    Did it involve swelling of the face/tongue/throat, SOB, or low BP? Yes Did it involve sudden or severe rash/hives, skin peeling, or any reaction on the inside of your mouth or nose? No Did you need to seek medical attention at a hospital or doctor's office? Yes When did it last happen?40+ years If all above answers are NO, may proceed with cephalosporin use.    Statins Other (See Comments)    Myalgias    Prednisone Other (See Comments)    psychosis    Lidocaine Palpitations   Sulfa Antibiotics Rash      Review of  Systems:   General:  normal appetite, decreased energy, + weight gain, no weight loss, no fever  Cardiac:  no chest pain with exertion, no chest pain at rest, +SOB with mild exertion, no resting SOB, no PND, + orthopnea, + palpitations, + arrhythmia, + atrial fibrillation, + LE edema, no dizzy spells, no syncope  Respiratory:  + shortness of breath, no home oxygen, no productive cough, no dry cough, no bronchitis, no wheezing, no hemoptysis, no asthma, no pain with inspiration or cough, + suspected sleep apnea, no CPAP at night  GI:   no difficulty swallowing, no reflux, no frequent heartburn, + hiatal hernia, no abdominal pain, no constipation, no diarrhea, no hematochezia, no hematemesis, no melena  GU:   no dysuria,  no frequency, no urinary tract infection, no hematuria, no kidney stones, no kidney disease  Vascular:  no pain suggestive of claudication, no pain in feet, no leg cramps, no varicose veins, no DVT, no non-healing foot ulcer  Neuro:   no stroke, no TIA's, no seizures, no headaches, no temporary blindness one eye,  no slurred speech, + peripheral neuropathy, no chronic pain, no instability of gait, no memory/cognitive dysfunction  Musculoskeletal: no arthritis, no joint swelling, no myalgias, no difficulty walking, normal mobility   Skin:   + rash, + itching, no skin infections, + pressure sores or ulcerations  Psych:   no anxiety, no depression, no nervousness, no unusual recent stress  Eyes:   + blurry vision, + floaters, no recent vision changes, + wears glasses or contacts  ENT:   no hearing loss, no loose or painful teeth, no dentures, last saw dentist 10/30/18  Hematologic:  + easy bruising, no abnormal bleeding, no clotting disorder, no frequent epistaxis  Endocrine:  + diabetes, does check CBG's at home           Physical Exam:   BP 124/80 (BP Location: Right Arm, Patient Position: Sitting, Cuff Size: Large)    Pulse 79    Resp 16    Ht '4\' 9"'  (1.448 m)    Wt 93.4 kg  SpO2 95% Comment: ON RA   BMI 44.58 kg/m   General:  Obese woman in no distress    HEENT:  Unremarkable, NCAT, PERLA, EOMI, oropharynx clear, teeth in good condition  Neck:   no JVD, no bruits, no adenopathy or thyromegaly  Chest:   clear to auscultation, symmetrical breath sounds, no wheezes, no rhonchi   CV:   RRR, grade III/VI crescendo/decrescendo murmur heard best at RSB,  no diastolic murmur  Abdomen:  soft, non-tender, no masses, panniculus  Extremities:  warm, well-perfused, pulses palpable in feet, mild LE edema  Rectal/GU  Deferred  Neuro:   Grossly non-focal and symmetrical throughout  Skin:   Clean and dry, no rashes, no breakdown   Diagnostic Tests:   ECHOCARDIOGRAM REPORT       Patient Name:   KORAL THADEN  Date of Exam: 10/13/2018 Medical Rec #:  [id number]     Height:       59.0 in Accession #:    [id number]    Weight:       203.0 lb Date of Birth:  Sep 07, 1940     BSA:          1.86 m Patient Age:    34 years      BP:           134/84 mmHg Patient Gender: F             HR:           97 bpm. Exam Location:  [address]    Procedure: 2D Echo, 3D Echo, Cardiac Doppler and Color Doppler  Indications:    I35.0 Aortic Stenosis (Severe)   History:        Patient has prior history of Echocardiogram examinations, most                 recent 04/26/2017. CHF, CAD, Atrial Fibrillation, Aortic Valve                 Disease and Prior Mean Gradient 49 mmHG; Signs/Symptoms:                 Dyspnea; Risk Factors: Hypertension, Diabetes, Dyslipidemia and                 Family History of Coronary Artery Disease. Obesity, Edema, Chest                 Pain, Severe Aortic Stenosis.   Sonographer:    Deliah Boston RDCS Referring Phys: [phone] KENNETH C HILTY  IMPRESSIONS    1. The left ventricle has normal systolic function of 55-37%. The cavity size was normal. There is moderately increased left ventricular wall thickness. Left ventricular diastolic function could not  be evaluated secondary to atrial fibrillation.  2. The right ventricle has normal systolic function. The cavity size was normal. There is no increase in right ventricular wall thickness. Right ventricular systolic pressure is moderately elevated with an estimated pressure of 49.8 mmHg.  3. Left atrial size was moderately dilated.  4. There is mild thickening. There is moderate mitral annular calcification present. The MR jet is centrally-directed. Mild mitral valve stenosis. Mean gradient 5 mm Hg at a mean heart rate of 88 bpm.  5. The tricuspid valve is normal in structure.  6. The aortic valve is tricuspid There is severe thickening and severe calcifcation of the aortic valve, with severely decreased cusp excursion. Aortic valve regurgitation is mild to moderate by color flow Doppler. The calculated aortic  valve area is  0.75 cm, consistent with severe stenosis. The mean gradient is 58 mm Hg.  7. The pulmonic valve was normal in structure.  8. No evidence of left ventricular regional wall motion abnormalities.  FINDINGS  Left Ventricle: The left ventricle has normal systolic function of 01-65%. The cavity size was normal. There is moderately increased left ventricular wall thickness. Left ventricular diastology could not be evaluated secondary to atrial fibrillation. No  evidence of left ventricular regional wall motion abnormalities.. Right Ventricle: The right ventricle has normal systolic function. The cavity was normal. There is no increase in right ventricular wall thickness. Right ventricular systolic pressure is moderately elevated with an estimated pressure of 49.8 mmHg. Left Atrium: left atrial size was moderately dilated Right Atrium: right atrial size was normal in size Interatrial Septum: No atrial level shunt detected by color flow Doppler.  Pericardium: There is no evidence of pericardial effusion. Mitral Valve: The mitral valve is degenerative in appearance. There is mild  thickening. There is moderate mitral annular calcification present. Mitral valve regurgitation is mild by color flow Doppler. The MR jet is centrally-directed. mild mitral valve  stenosis. Tricuspid Valve: The tricuspid valve is normal in structure. Tricuspid valve regurgitation is mild by color flow Doppler. Aortic Valve: The aortic valve is tricuspid There is severe thickening of the aortic valve, with severely decreased cusp excursion. Aortic valve regurgitation is mild to moderate by color flow Doppler. The calculated aortic valve area is 0.75 cm,  consistent with severe stenosis. Pulmonic Valve: The pulmonic valve was normal in structure. Pulmonic valve regurgitation is not visualized by color flow Doppler. Venous: The inferior vena cava is normal in size with greater than 50% respiratory variability.   LEFT VENTRICLE PLAX 2D (Teich) LV EF:          76.5 %   Diastology LVIDd:          3.14 cm  LV e' lateral:   6.53 cm/s LVIDs:          1.76 cm  LV E/e' lateral: 25.1 LV PW:          1.46 cm  LV e' medial:    5.00 cm/s LV IVS:         1.71 cm  LV E/e' medial:  32.8 LVOT diam:      2.30 cm LV SV:          30 ml LVOT Area:      4.15 cm  RIGHT VENTRICLE RV S prime:     11.20 cm/s TAPSE (M-mode): 1.5 cm RVSP:           49.8 mmHg  LEFT ATRIUM              Index       RIGHT ATRIUM           Index LA diam:        4.90 cm  2.64 cm/m  RA Pressure: 3 mmHg LA Vol (A2C):   79.3 ml  42.72 ml/m RA Area:     17.10 cm LA Vol (A4C):   106.0 ml 57.11 ml/m RA Volume:   47.20 ml  25.43 ml/m LA Biplane Vol: 92.5 ml  49.84 ml/m  AORTIC VALVE AV Area (Vmax):    0.79 cm AV Area (Vmean):   0.71 cm AV Area (VTI):     0.75 cm AV Vmax:           500.00 cm/s AV Vmean:  351.200 cm/s AV VTI:            1.222 m AV Peak Grad:      100.0 mmHg AV Mean Grad:      57.8 mmHg LVOT Vmax:         94.50 cm/s LVOT Vmean:        59.900 cm/s LVOT VTI:          0.221 m LVOT/AV VTI ratio: 0.18 AR  PHT:            445 msec   AORTA Ao Root diam: 3.00 cm Ao Asc diam:  3.50 cm  MITRAL VALVE               TR Peak grad: 46.8 mmHg MV Area (PHT): 2.82 cm    TR Vmax:      347.00 cm/s MV Peak grad:  16.5 mmHg   RVSP:         49.8 mmHg MV Mean grad:  5.0 mmHg MV Vmax:       2.03 m/s MV Vmean:      97.7 cm/s MV VTI:        0.46 m MV PHT:        78 msec MV Decel Time: 338 msec MR Peak grad: 199.4 mmHg MR Mean grad: 133.0 mmHg MR Vmax:      706.00 cm/s MR Vmean:     544.0 cm/s MR PISA:      1.01 MV E velocity: 164.00 cm/s    Dani Watlington Croitoru MD Electronically signed by Sanda Klein MD Signature Date/Time: 10/13/2018/1:09:23 PM    Physicians   Panel Physicians Referring Physician Case Authorizing Physician  Burnell Blanks, MD (Primary)    Procedures   RIGHT/LEFT HEART CATH AND CORONARY ANGIOGRAPHY  Conclusion     Prox RCA to Mid RCA lesion is 10% stenosed.  Mid RCA to Dist RCA lesion is 10% stenosed.  Mid LM to Dist LM lesion is 20% stenosed.  Ost Cx to Prox Cx lesion is 40% stenosed.  Ost 2nd Mrg lesion is 30% stenosed.  Prox LAD lesion is 40% stenosed.  Mid LAD to Dist LAD lesion is 30% stenosed.   1. Mild to moderate non-obstructive CAD 2. The mid LAD has a moderate stenosis. Unchanged from cath in 2016 3. Mild to moderate ostial Circumflex stenosis.  4. Large dominant RCA with luminal irregularities.  5. Severe aortic stenosis (mean gradient 43 mmHg, peak to peak gradient 65 mmhg, AVA 0.64 cm2).   Recommendations: Medical management of CAD. Continue workup for TAVR.    Indications   Severe aortic stenosis [I35.0 (ICD-10-CM)]  Procedural Details   Technical Details Indication: 78 yo female with CAD, atrial fibrillation, severe AS. Cardiac cath in workup for TAVR.   Procedure: The risks, benefits, complications, treatment options, and expected outcomes were discussed with the patient. The patient and/or family concurred with the proposed  plan, giving informed consent. The patient was brought to the cath lab after IV hydration was given. The patient was sedated with Versed and Fentanyl. I changed out the IV catheter in the right antecubital vein for a 5 French sheath. Right heart cath performed with a balloon tipped catheter. The right wrist was prepped and draped in a sterile fashion. 1% lidocaine was used for local anesthesia. Using the modified Seldinger access technique, a 5 French sheath was placed in the right radial artery. 3 mg Verapamil was given through the sheath. 4500 units IV heparin was given. Standard diagnostic  catheters were used to perform selective coronary angiography. I crossed the aortic valve with an AL-1 catheter and a straight wire. The sheath was removed from the right radial artery and a Terumo hemostasis band was applied at the arteriotomy site on the right wrist.    Estimated blood loss <50 mL.   During this procedure medications were administered to achieve and maintain moderate conscious sedation while the patient's heart rate, blood pressure, and oxygen saturation were continuously monitored and I was present face-to-face 100% of this time.  Medications  (Filter: Administrations occurring from 10/30/18 0718 to 10/30/18 5465)  Medication Rate/Dose/Volume Action  Date Time   lidocaine (PF) (XYLOCAINE) 1 % injection (mL) 2 mL Given 10/30/18 0753   Total dose as of 11/12/18 1545 2 mL Given 0759   4 mL        Radial Cocktail/Verapamil only (mL) 10 mL Given 10/30/18 0801   Total dose as of 11/12/18 1545        10 mL        fentaNYL (SUBLIMAZE) injection (mcg) 25 mcg Given 10/30/18 0750   Total dose as of 11/12/18 1545        25 mcg        midazolam (VERSED) injection (mg) 1 mg Given 10/30/18 0750   Total dose as of 11/12/18 1545        1 mg        heparin injection (Units) 4,500 Units Given 10/30/18 0804   Total dose as of 11/12/18 1545        4,500 Units        Heparin (Porcine) in NaCl 1000-0.9  UT/500ML-% SOLN (mL) 500 mL Given 10/30/18 0804   Total dose as of 11/12/18 1545 500 mL Given 0804   1,000 mL        iohexol (OMNIPAQUE) 350 MG/ML injection (mL) 40 mL Given 10/30/18 0816   Total dose as of 11/12/18 1545        40 mL        Sedation Time   Sedation Time Physician-1: 24 minutes 52 seconds  Complications   Complications documented before study signed (10/30/2018 8:41 AM EST)    RIGHT/LEFT HEART CATH AND CORONARY ANGIOGRAPHY   None Documented by Burnell Blanks, MD 10/30/2018 8:30 AM EST  Time Range: Intraprocedure      Coronary Findings   Diagnostic  Dominance: Right  Left Main  Mid LM to Dist LM lesion 20% stenosed  Mid LM to Dist LM lesion is 20% stenosed. The lesion is eccentric.  Left Anterior Descending  Vessel is large.  Prox LAD lesion 40% stenosed  Prox LAD lesion is 40% stenosed.  Mid LAD to Dist LAD lesion 30% stenosed  Mid LAD to Dist LAD lesion is 30% stenosed.  Left Circumflex  Vessel is large.  Ost Cx to Prox Cx lesion 40% stenosed  Ost Cx to Prox Cx lesion is 40% stenosed.  Second Obtuse Marginal Branch  Vessel is moderate in size.  Ost 2nd Mrg lesion 30% stenosed  Ost 2nd Mrg lesion is 30% stenosed.  Right Coronary Artery  Vessel is large.  Prox RCA to Mid RCA lesion 10% stenosed  Prox RCA to Mid RCA lesion is 10% stenosed.  Mid RCA to Dist RCA lesion 10% stenosed  Mid RCA to Dist RCA lesion is 10% stenosed.  Intervention   No interventions have been documented.  Coronary Diagrams   Diagnostic  Dominance: Right    Intervention  Implants    No implant documentation for this case.  Syngo Images   Show images for CARDIAC CATHETERIZATION  MERGE Images   Show images for CARDIAC CATHETERIZATION   Link to Procedure Log   Procedure Log    Hemo Data    Most Recent Value  Fick Cardiac Output 4.22 L/min  Fick Cardiac Output Index 2.33 (L/min)/BSA  Aortic Mean Gradient 42.98 mmHg  Aortic Peak Gradient 65 mmHg    Aortic Valve Area 0.64  Aortic Value Area Index 0.35 cm2/BSA  RA A Wave 7 mmHg  RA V Wave 5 mmHg  RA Mean 4 mmHg  RV Systolic Pressure 34 mmHg  RV Diastolic Pressure 2 mmHg  RV EDP 4 mmHg  PA Systolic Pressure 37 mmHg  PA Diastolic Pressure 19 mmHg  PA Mean 23 mmHg  PW A Wave 3 mmHg  PW V Wave 4 mmHg  PW Mean 2 mmHg  AO Systolic Pressure 91 mmHg  AO Diastolic Pressure 52 mmHg  AO Mean 70 mmHg  LV Systolic Pressure 335 mmHg  LV Diastolic Pressure 2 mmHg  LV EDP 8 mmHg  AOp Systolic Pressure 93 mmHg  AOp Diastolic Pressure 48 mmHg  AOp Mean Pressure 65 mmHg  LVp Systolic Pressure 456 mmHg  LVp Diastolic Pressure 3 mmHg  LVp EDP Pressure 8 mmHg  QP/QS 1  TPVR Index 9.86 HRUI  TSVR Index 30.01 HRUI  PVR SVR Ratio 0.32  TPVR/TSVR Ratio 0.33    ADDENDUM REPORT: 11/05/2018 15:30  EXAM: OVER-READ INTERPRETATION  CT CHEST  The following report is an over-read performed by radiologist Dr. Rebekah [facility] on 11/05/2018. This over-read does not include interpretation of cardiac or coronary anatomy or pathology. The cardiac CTA interpretation by the cardiologist is attached.  COMPARISON:  None.  FINDINGS: Extracardiac findings are described separately under dictation for contemporaneously obtained CTA chest, abdomen and pelvis.  IMPRESSION: Please see separate dictation for contemporaneously obtained CTA chest, abdomen and pelvis dated 11/05/2018 for full description of relevant extracardiac findings.   Electronically Signed   By: Vinnie Langton M.D.   On: 11/05/2018 15:30   Addended by Etheleen Mayhew, MD on 11/05/2018 3:32 PM    Study Result   CLINICAL DATA:  Aortic Stenosis  EXAM: Cardiac TAVR CT  TECHNIQUE: The patient was scanned on a Siemens Force 256 slice scanner. A 120 kV retrospective scan was triggered in the ascending thoracic aorta at 140 HU's. Gantry rotation speed was 250 msecs and collimation was .6  mm. No beta blockade or nitro were given. The 3D data set was reconstructed in 5% intervals of the R-R cycle. Systolic and diastolic phases were analyzed on a dedicated work station using MPR, MIP and VRT modes. The patient received 80 cc of contrast.  FINDINGS: Aortic Valve: Tri leaflet calcified with restricted leaflet motion  Aorta: Normal diameter no aneurysm moderate calcific atherosclerosis  Sino-tubular Junction: 27 mm  Ascending Thoracic Aorta: 34 mm  Aortic Arch: 26 mm  Descending Thoracic Aorta: 23 mm  Sinus of Valsalva Measurements:  Non-coronary: 27.5 mm  Right - coronary: 28.6 mm  Left -   coronary: 27.6 mm  Coronary Artery Height above Annulus:  Left Main: 12.8 mm above annulus  Right Coronary: 15.1 mm above annulus  Virtual Basal Annulus Measurements:  Maximum / Minimum Diameter: 25 mm x 17.4 mm  Perimeter: 69 mm  Area: 361 mm2  Coronary Arteries: Sufficient height above annulus for deployment  Optimum Fluoroscopic Angle for Delivery:  LAO 2 Caudal 4 degrees  IMPRESSION: 1. Calcified tri leaflet AV with annular area of 361 mm2 suitable for a 23 mm Sapien 3 valve  2.  Normal aortic root 3.4 cm  3.  Optimum angiographic angle for deployment LAO 2 Caudal 4 degrees  4.  Coronary arteries sufficient height above annulus for deployment  Jenkins Rouge  Electronically Signed: By: Jenkins Rouge M.D. On: 11/05/2018 12:34       CLINICAL DATA:  78 year old female with history of severe aortic stenosis. Preprocedural study prior to potential transcatheter aortic valve replacement (TAVR) procedure.  EXAM: CT ANGIOGRAPHY CHEST, ABDOMEN AND PELVIS  TECHNIQUE: Multidetector CT imaging through the chest, abdomen and pelvis was performed using the standard protocol during bolus administration of intravenous contrast. Multiplanar reconstructed images and MIPs were obtained and reviewed to evaluate the vascular  anatomy.  CONTRAST:  110m OMNIPAQUE IOHEXOL 350 MG/ML SOLN  COMPARISON:  CT the abdomen and pelvis 04/27/2017.  FINDINGS: CTA CHEST FINDINGS  Cardiovascular: Heart size is enlarged with left atrial dilatation. There is no significant pericardial fluid, thickening or pericardial calcification. There is aortic atherosclerosis, as well as atherosclerosis of the great vessels of the mediastinum and the coronary arteries, including calcified atherosclerotic plaque in the left main, left anterior descending and right coronary arteries. Severe thickening calcification of the aortic valve. Moderate to severe calcifications of the mitral annulus.  Mediastinum/Lymph Nodes: No pathologically enlarged mediastinal or hilar lymph nodes. Esophagus is unremarkable in appearance. No axillary lymphadenopathy.  Lungs/Pleura: No acute consolidative airspace disease. No pleural effusions. No suspicious appearing pulmonary nodules or masses are noted.  Musculoskeletal/Soft Tissues: There are no aggressive appearing lytic or blastic lesions noted in the visualized portions of the skeleton.  CTA ABDOMEN AND PELVIS FINDINGS  Hepatobiliary: Liver has a shrunken appearance and nodular contour, indicative of cirrhosis. In segment 3 of the liver there is a 1.1 x 1.4 cm hypervascular area (axial image 86 of series 18). No intra or extrahepatic biliary ductal dilatation. Gallbladder is normal in appearance.  Pancreas: No pancreatic mass. No pancreatic ductal dilatation. No pancreatic or peripancreatic fluid or inflammatory changes.  Spleen: Wedge-shaped area of hypoperfusion in the spleen concerning for potential infarct (axial image 83 of series 18).  Adrenals/Urinary Tract: Bilateral kidneys and bilateral adrenal glands are normal in appearance. No hydroureteronephrosis. Urinary bladder is normal in appearance.  Stomach/Bowel: Normal appearance of the stomach. No  pathologic dilatation of small bowel or colon. The appendix is not confidently identified and may be surgically absent. Regardless, there are no inflammatory changes noted adjacent to the cecum to suggest the presence of an acute appendicitis at this time.  Vascular/Lymphatic: Aortic atherosclerosis, without evidence of aneurysm or dissection in the abdominal or pelvic vasculature. Vascular findings and measurements pertinent to potential TAVR procedure, as detailed below. No lymphadenopathy noted in the abdomen or pelvis.  Reproductive: Uterus and ovaries are unremarkable in appearance.  Other: No significant volume of ascites.  No pneumoperitoneum.  Musculoskeletal: There are no aggressive appearing lytic or blastic lesions noted in the visualized portions of the skeleton.  VASCULAR MEASUREMENTS PERTINENT TO TAVR:  AORTA:  Minimal Aortic Diameter-12 x 9 mm  Severity of Aortic Calcification-moderate  RIGHT PELVIS:  Right Common Iliac Artery -  Minimal Diameter-6.8 x 6.2 mm  Tortuosity-mild  Calcification-moderate  Right External Iliac Artery -  Minimal Diameter-5.8 x 5.8 mm  Tortuosity-moderate  Calcification-none  Right Common Femoral Artery -  Minimal Diameter-6.1 x 6.1 mm  Tortuosity-mild  Calcification-mild  LEFT PELVIS:  Left Common Iliac Artery -  Minimal Diameter-6.2 x 5.1 mm  Tortuosity-mild  Calcification-moderate  Left External Iliac Artery -  Minimal Diameter-6.6 x 6.4 mm  Tortuosity-moderate  Calcification-none  Left Common Femoral Artery -  Minimal Diameter-6.0 x 5.8 mm  Tortuosity-mild  Calcification-mild  Review of the MIP images confirms the above findings.  IMPRESSION: 1. Vascular findings and measurements pertinent to potential TAVR procedure, as detailed above. 2. Thickening calcification of the aortic valve, compatible with the reported clinical history of severe aortic  stenosis. 3. Aortic atherosclerosis, in addition to left main and 2 vessel coronary artery disease. Assessment for potential risk factor modification, dietary therapy or pharmacologic therapy may be warranted, if clinically indicated. 4. There is also moderate to severe calcifications of the mitral annulus. 5. The liver is cirrhotic, and there is a hypervascular area in segment 3 of the liver. Although this is favored to represent a perfusion anomaly, given the cirrhosis, further evaluation with nonemergent abdominal MRI with and without IV gadolinium is strongly recommended in the near future to better characterize this finding, as the possibility of hepatocellular carcinoma is not excluded. 6. Wedge-shaped area of hypoperfusion in the spleen concerning for potential splenic infarct. This could alternatively be related to the early phase of enhancement. Attention at time of forthcoming abdominal MRI is recommended to better evaluate this finding during a multiphase examination.   Electronically Signed   By: Vinnie Langton M.D.   On: 11/05/2018 15:29  STS Risk Score: AVR Risk of Mortality: 4.868%  Renal Failure: 6.049%  Permanent Stroke: 1.404%  Prolonged Ventilation: 15.695%  DSW Infection: 0.282%  Reoperation: 3.528%  Morbidity or Mortality: 24.729%  Short Length of Stay: 15.728%  Long Length of Stay: 14.245%  Impression:  This 78 year old woman has stage D, critical, symptomatic aortic stenosis with New York Heart Association class III symptoms of exertional fatigue and shortness of breath as well as orthopnea and peripheral edema consistent with chronic diastolic congestive heart failure.  I have personally reviewed her 2D echocardiogram, cardiac catheterization, and CTA studies.  Her echocardiogram shows a trileaflet aortic valve with severe thickening and calcification of the valve leaflets with severe restriction of mobility.  The mean gradient is 58 mmHg consistent  with critical aortic stenosis.  There is also moderate aortic insufficiency with an AI pressure half-time of 445 ms.  Left ventricular systolic function is normal.  Cardiac catheterization shows mild to moderate nonobstructive coronary disease.  The mean gradient was measured at 43 mmHg with a peak to peak gradient of 65 mmHg and an aortic valve area of 0.64 cm.  I agree that aortic valve replacement is indicated in this patient with progressive symptoms that are markedly limiting her quality of life and activity level.  She would be a moderate risk patient for open surgical aortic valve replacement and I think transcatheter aortic valve replacement would be the best option for her at 78 years old.  Her gated cardiac CTA shows anatomy suitable for transcatheter aortic valve replacement using a 23 mm Sapien 3 valve.  Her BSA is 1.86 m so this valve should be adequate for her.  She has been in chronic atrial fibrillation and not on anticoagulation due to history of GI bleeding due to gastric AVMs but there is no evidence of left atrial appendage thrombus on gated cardiac CTA.  Her abdominal and pelvic CTA shows adequate pelvic vascular anatomy to allow transfemoral insertion.    The patient and her two daughters  were counseled at length regarding treatment alternatives for management of severe symptomatic aortic stenosis. The risks and benefits of surgical intervention has been discussed in detail. Long-term prognosis with medical therapy was discussed. Alternative approaches such as conventional surgical aortic valve replacement, transcatheter aortic valve replacement, and palliative medical therapy were compared and contrasted at length. This discussion was placed in the context of the patient's own specific clinical presentation and past medical history. All of their questions have been addressed.    Following the decision to proceed with transcatheter aortic valve replacement, a discussion was held  regarding what types of management strategies would be attempted intraoperatively in the event of life-threatening complications, including whether or not the patient would be considered a candidate for the use of cardiopulmonary bypass and/or conversion to open sternotomy for attempted surgical intervention. The patient is aware of the fact that transient use of cardiopulmonary bypass may be necessary.  I think she would be a candidate for emergent sternotomy if needed to manage any intraoperative complications.  The patient has been advised of a variety of complications that might develop including but not limited to risks of death, stroke, paravalvular leak, aortic dissection or other major vascular complications, aortic annulus rupture, device embolization, cardiac rupture or perforation, mitral regurgitation, acute myocardial infarction, arrhythmia, heart block or bradycardia requiring permanent pacemaker placement, congestive heart failure, respiratory failure, renal failure, pneumonia, infection, other late complications related to structural valve deterioration or migration, or other complications that might ultimately cause a temporary or permanent loss of functional independence or other long term morbidity. The patient provides full informed consent for the procedure as described and all questions were answered.       Plan:  Transfemoral transcatheter aortic valve replacement using a Sapien 3 valve on Tuesday, 11/18/2018.  I spent 60 minutes performing this consultation and > 50% of this time was spent face to face counseling and coordinating the care of this patient's critical aortic stenosis.    Gaye Pollack  MD 11/12/2018

## 2018-11-13 ENCOUNTER — Encounter: Payer: Medicare HMO | Admitting: Surgery

## 2018-11-14 ENCOUNTER — Other Ambulatory Visit (HOSPITAL_COMMUNITY): Payer: Medicare HMO

## 2018-11-14 NOTE — Pre-Procedure Instructions (Signed)
Jakya Hargrett  11/14/2018      [id number] - [place], [place] - [address] [place] [address] Phone: [phone] Fax: [phone]    Your procedure is scheduled on March 17th.  Report to [facility] Entrance "A" at 7:15 A.M.  Call this number if you have problems the morning of surgery:  [phone]   Remember:  Do not eat or drink after midnight.     Take these medicines the morning of surgery with A SIP OF WATER: NONE  Follow your surgeon's instructions on when to stop Asprin.  If no instructions were given by your surgeon then you will need to call the office to get those instructions.    7 days prior to surgery STOP taking any Aspirin (unless otherwise instructed by your surgeon), Aleve, Naproxen, Ibuprofen, Motrin, Advil, Goody's, BC's, all herbal medications, fish oil, and all vitamins.   WHAT DO I DO ABOUT MY DIABETES MEDICATION?   Marland Kitchen Do not take METFORMIN the day before or the morning of surgery.  . THE NIGHT BEFORE SURGERY, take 7 units (1/2 of normal dose) of Humulin N insulin.      . THE MORNING OF SURGERY, take 7 units (1/2 of normal dose) of Humulin N insulin    How to Manage Your Diabetes Before and After Surgery  Why is it important to control my blood sugar before and after surgery? . Improving blood sugar levels before and after surgery helps healing and can limit problems. . A way of improving blood sugar control is eating a healthy diet by: o  Eating less sugar and carbohydrates o  Increasing activity/exercise o  Talking with your doctor about reaching your blood sugar goals . High blood sugars (greater than 180 mg/dL) can raise your risk of infections and slow your recovery, so you will need to focus on controlling your diabetes during the weeks before surgery. . Make sure that the doctor who takes care of your diabetes knows about your planned surgery including the date and location.  How do I manage my blood  sugar before surgery? . Check your blood sugar at least 4 times a day, starting 2 days before surgery, to make sure that the level is not too high or low. o Check your blood sugar the morning of your surgery when you wake up and every 2 hours until you get to the Short Stay unit. . If your blood sugar is less than 70 mg/dL, you will need to treat for low blood sugar: o Do not take insulin. o Treat a low blood sugar (less than 70 mg/dL) with  cup of clear juice (cranberry or apple), 4 glucose tablets, OR glucose gel. o Recheck blood sugar in 15 minutes after treatment (to make sure it is greater than 70 mg/dL). If your blood sugar is not greater than 70 mg/dL on recheck, call [phone] for further instructions. . Report your blood sugar to the short stay nurse when you get to Short Stay.  . If you are admitted to the hospital after surgery: o Your blood sugar will be checked by the staff and you will probably be given insulin after surgery (instead of oral diabetes medicines) to make sure you have good blood sugar levels. o The goal for blood sugar control after surgery is 80-180 mg/dL.     Do not wear jewelry, make-up or nail polish.  Do not wear lotions, powders, or perfumes, or deodorant.  Do not  shave 48 hours prior to surgery.    Do not bring valuables to the hospital.  [facility] is not responsible for any belongings or valuables.   [facility]- Preparing For Surgery  Before surgery, you can play an important role. Because skin is not sterile, your skin needs to be as free of germs as possible. You can reduce the number of germs on your skin by washing with CHG (chlorahexidine gluconate) Soap before surgery.  CHG is an antiseptic cleaner which kills germs and bonds with the skin to continue killing germs even after washing.    Oral Hygiene is also important to reduce your risk of infection.  Remember - BRUSH YOUR TEETH THE MORNING OF SURGERY WITH YOUR REGULAR TOOTHPASTE  Please  do not use if you have an allergy to CHG or antibacterial soaps. If your skin becomes reddened/irritated stop using the CHG.  Do not shave (including legs and underarms) for at least 48 hours prior to first CHG shower. It is OK to shave your face.  Please follow these instructions carefully.   1. Shower the NIGHT BEFORE SURGERY and the MORNING OF SURGERY with CHG.   2. If you chose to wash your hair, wash your hair first as usual with your normal shampoo.  3. After you shampoo, rinse your hair and body thoroughly to remove the shampoo.  4. Use CHG as you would any other liquid soap. You can apply CHG directly to the skin and wash gently with a scrungie or a clean washcloth.   5. Apply the CHG Soap to your body ONLY FROM THE NECK DOWN.  Do not use on open wounds or open sores. Avoid contact with your eyes, ears, mouth and genitals (private parts). Wash Face and genitals (private parts)  with your normal soap.  6. Wash thoroughly, paying special attention to the area where your surgery will be performed.  7. Thoroughly rinse your body with warm water from the neck down.  8. DO NOT shower/wash with your normal soap after using and rinsing off the CHG Soap.  9. Pat yourself dry with a CLEAN TOWEL.  10. Wear CLEAN PAJAMAS to bed the night before surgery, wear comfortable clothes the morning of surgery  11. Place CLEAN SHEETS on your bed the night of your first shower and DO NOT SLEEP WITH PETS.   Day of Surgery:  Do not apply any deodorants/lotions.  Please wear clean clothes to the hospital/surgery center.   Remember to brush your teeth WITH YOUR REGULAR TOOTHPASTE.   Contacts, dentures or bridgework may not be worn into surgery.  Leave your suitcase in the car.  After surgery it may be brought to your room.  For patients admitted to the hospital, discharge time will be determined by your treatment team.  Patients discharged the day of surgery will not be allowed to drive home.    Please read over the following fact sheets that you were given. Coughing and Deep Breathing, MRSA Information and Surgical Site Infection Prevention

## 2018-11-17 ENCOUNTER — Encounter (HOSPITAL_COMMUNITY): Payer: Self-pay | Admitting: Certified Registered Nurse Anesthetist

## 2018-11-17 ENCOUNTER — Encounter (HOSPITAL_COMMUNITY)
Admission: RE | Admit: 2018-11-17 | Discharge: 2018-11-17 | Disposition: A | Discharge status: Home / Self Care | Payer: Medicare HMO | Source: Ambulatory Visit | Attending: Cardiovascular Disease | Admitting: Cardiovascular Disease

## 2018-11-17 ENCOUNTER — Encounter (HOSPITAL_COMMUNITY): Payer: Self-pay

## 2018-11-17 ENCOUNTER — Other Ambulatory Visit: Payer: Self-pay

## 2018-11-17 ENCOUNTER — Ambulatory Visit (HOSPITAL_COMMUNITY)
Admission: RE | Admit: 2018-11-17 | Discharge: 2018-11-17 | Disposition: A | Discharge status: Home / Self Care | Payer: Medicare HMO | Source: Ambulatory Visit | Attending: Cardiovascular Disease | Admitting: Cardiovascular Disease

## 2018-11-17 DIAGNOSIS — I350 Nonrheumatic aortic (valve) stenosis: Secondary | ICD-10-CM

## 2018-11-17 HISTORY — DX: Unspecified asthma, uncomplicated: J45.909

## 2018-11-17 LAB — COMPREHENSIVE METABOLIC PANEL
ALT: 34 U/L (ref 0–44)
AST: 41 U/L (ref 15–41)
Albumin: 3.7 g/dL (ref 3.5–5.0)
Alkaline Phosphatase: 98 U/L (ref 38–126)
Anion gap: 10 (ref 5–15)
BUN: 27 mg/dL — ABNORMAL HIGH (ref 8–23)
CO2: 20 mmol/L — ABNORMAL LOW (ref 22–32)
Calcium: 9 mg/dL (ref 8.9–10.3)
Chloride: 108 mmol/L (ref 98–111)
Creatinine, Ser: 0.84 mg/dL (ref 0.44–1.00)
GFR calc Af Amer: >60 mL/min (ref >60)
GFR calc non Af Amer: >60 mL/min (ref >60)
Glucose, Bld: 150 mg/dL — ABNORMAL HIGH (ref 70–99)
Potassium: 4.2 mmol/L (ref 3.5–5.1)
Sodium: 138 mmol/L (ref 135–145)
Total Bilirubin: 0.4 mg/dL (ref 0.3–1.2)
Total Protein: 7.1 g/dL (ref 6.5–8.1)

## 2018-11-17 LAB — CBC
HCT: 34 % — ABNORMAL LOW (ref 36.0–46.0)
Hemoglobin: 10.3 g/dL — ABNORMAL LOW (ref 12.0–15.0)
MCH: 29.6 pg (ref 26.0–34.0)
MCHC: 30.3 g/dL (ref 30.0–36.0)
MCV: 97.7 fL (ref 80.0–100.0)
Platelets: 217 10*3/uL (ref 150–400)
RBC: 3.48 MIL/uL — ABNORMAL LOW (ref 3.87–5.11)
RDW: 17 % — ABNORMAL HIGH (ref 11.5–15.5)
WBC: 8.7 10*3/uL (ref 4.0–10.5)
nRBC: 0 % (ref 0.0–0.2)

## 2018-11-17 LAB — SURGICAL PCR SCREEN
MRSA, PCR: POSITIVE — AB
Staphylococcus aureus: POSITIVE — AB

## 2018-11-17 LAB — TYPE AND SCREEN
ABO/RH(D): O POS
Antibody Screen: NEGATIVE

## 2018-11-17 LAB — URINALYSIS, ROUTINE W REFLEX MICROSCOPIC
Bacteria, UA: NONE SEEN
Bilirubin Urine: NEGATIVE
Glucose, UA: NEGATIVE mg/dL
Hgb urine dipstick: NEGATIVE
Ketones, ur: NEGATIVE mg/dL
Nitrite: NEGATIVE
Protein, ur: NEGATIVE mg/dL
Specific Gravity, Urine: 1.018 (ref 1.005–1.030)
pH: 5 (ref 5.0–8.0)

## 2018-11-17 LAB — BLOOD GAS, ARTERIAL
Acid-base deficit: 1.9 mmol/L (ref 0.0–2.0)
Bicarbonate: 21.8 mmol/L (ref 20.0–28.0)
Drawn by: 449841
FIO2: 21
O2 Saturation: 93.7 %
Patient temperature: 98.6
pCO2 arterial: 33.3 mmHg (ref 32.0–48.0)
pH, Arterial: 7.431 (ref 7.350–7.450)
pO2, Arterial: 68.6 mmHg — ABNORMAL LOW (ref 83.0–108.0)

## 2018-11-17 LAB — GLUCOSE, CAPILLARY: Glucose-Capillary: 151 mg/dL — ABNORMAL HIGH (ref 70–99)

## 2018-11-17 LAB — HEMOGLOBIN A1C
Hgb A1c MFr Bld: 6.2 % — ABNORMAL HIGH (ref 4.8–5.6)
Mean Plasma Glucose: 131.24 mg/dL

## 2018-11-17 LAB — BRAIN NATRIURETIC PEPTIDE: B Natriuretic Peptide: 262.8 pg/mL — ABNORMAL HIGH (ref 0.0–100.0)

## 2018-11-17 LAB — PROTIME-INR
INR: 1.1 (ref 0.8–1.2)
Prothrombin Time: 13.6 seconds (ref 11.4–15.2)

## 2018-11-17 LAB — APTT: aPTT: 34 seconds (ref 24–36)

## 2018-11-17 MED ORDER — NOREPINEPHRINE 4 MG/250ML-% IV SOLN
0.0000 ug/min | INTRAVENOUS | Status: AC
  Administered 2018-11-18: 2 ug/min via INTRAVENOUS
  Filled 2018-11-17: qty 250

## 2018-11-17 MED ORDER — LEVOFLOXACIN IN D5W 500 MG/100ML IV SOLN
500.0000 mg | INTRAVENOUS | Status: AC
  Administered 2018-11-18: 500 mg via INTRAVENOUS
  Filled 2018-11-17 (×2): qty 100

## 2018-11-17 MED ORDER — DEXMEDETOMIDINE HCL IN NACL 400 MCG/100ML IV SOLN
0.1000 ug/kg/h | INTRAVENOUS | Status: DC
  Filled 2018-11-17 (×2): qty 100

## 2018-11-17 MED ORDER — SODIUM CHLORIDE 0.9 % IV SOLN
INTRAVENOUS | Status: DC
  Filled 2018-11-17: qty 30

## 2018-11-17 MED ORDER — MAGNESIUM SULFATE 50 % IJ SOLN
40.0000 meq | INTRAMUSCULAR | Status: DC
  Filled 2018-11-17: qty 9.85

## 2018-11-17 MED ORDER — POTASSIUM CHLORIDE 2 MEQ/ML IV SOLN
80.0000 meq | INTRAVENOUS | Status: DC
  Filled 2018-11-17: qty 40

## 2018-11-17 MED ORDER — VANCOMYCIN HCL 10 G IV SOLR
1250.0000 mg | INTRAVENOUS | Status: DC
  Filled 2018-11-17: qty 1250

## 2018-11-17 NOTE — Progress Notes (Addendum)
PCP - Tarri Fuller Cardiologist - Dr. Rennis Golden  Chest x-ray - 11/17/18 EKG - 11/17/18 Fayrene Fearing called and notified of abnormal EKG) ECHO - 10/13/18 Cardiac Cath - 10/30/18  SA - at risk, pt already aware that she is at risk, PCP is aware pt plans on having sleep study done after surgery  DM - type 2 Fasting Blood Sugar - 100-120s  Aspirin Instructions: follow your surgeon's instructions on when to stop aspirin  Anesthesia review: yes, heart history  Patient denies fever, cough and chest pain at PAT appointment Pt states she has SOB upon exertion.  SOB has has gradually gotten worse over the past few months.   Patient verbalized understanding of instructions that were given to them at the PAT appointment. Patient was also instructed that they will need to review over the PAT instructions again at home before surgery.

## 2018-11-17 NOTE — H&P (Signed)
[address]       [place] [address]             [phone]      Cardiothoracic Surgery Admission History and Physical   Referring Provider is Hilty, Lisette Abu, MD Primary Cardiologist is Chrystie Nose, MD PCP is Tarri Fuller, MD      Chief Complaint  Patient presents with   Aortic Stenosis        HPI:  The patient is a 78 year old woman with history of type 2 diabetes, hyperlipidemia, hypertension, diastolic congestive heart failure, coronary disease, atrial fibrillation, and severe aortic stenosis who was referred for evaluation for transcatheter aortic valve replacement.  Her aortic stenosis had been followed for several years in [facility] by Dr. Heron Nay.  She said that she was having symptoms but nothing was being done.   she was evaluated in [place] and was seen by Dr. Mayford Knife.  She was admitted to [facility] in August 2018 with marked anemia with a hemoglobin of 6.6, heme positive stools, and congestive heart failure.  She did have an echocardiogram done here in August 2018 which showed severe aortic stenosis with a mean gradient of 49 mmHg and a peak of 77 mmHg.  Aortic valve area at that time was 0.65 cm.  Left ventricular ejection fraction was normal.  She was transfused and felt better.  GI was consulted and endoscopic evaluation was recommended.  She subsequently underwent outpatient upper endoscopy by Dr. Loreta Ave which showed AVMs which were apparently clipped.  She then went back for cardiology follow-up in [facility] and continued to have fatigue and exertional shortness of breath.  She eventually self referred to Dr. Rennis Golden who she saw last month.  A follow-up echocardiogram on 10/13/2018 showed severe aortic stenosis with a mean gradient of 58 mmHg and aortic valve area of 0.75 cm.  There is moderate aortic insufficiency.  Left ventricular ejection fraction is 60-65%.  There is moderate left ventricular hypertrophy.  She underwent  cardiac catheterization on 10/30/2018 which showed mild to moderate nonobstructive coronary disease with about 30-40% mid LAD stenosis which was unchanged from prior catheterization in 2016.  There is also about 40% ostial proximal left circumflex stenosis.  The mean gradient across aortic valve was 43 mmHg with a peak to peak gradient of 65 mmHg and a valve area of 0.64 cm.  PA pressure was 37/19 with a mean of 23.  The patient has 2 daughters who are nurses in for [employer].  One works on [employer] and the other works at [employer] in the emergency room.  She is widowed and [place].  She had previously been very active until the past year when she has noted a progressive downhill course with exertional fatigue and shortness of breath.  She has had no dizziness or syncope.  She does have orthopnea and peripheral edema.      Past Medical History:  Diagnosis Date   Acute diastolic CHF (congestive heart failure) (HCC) 09/25/2014   Anemia    Aortic atherosclerosis (HCC) 04/29/2017   CAD (coronary artery disease) 2016   non-obstructive at cath   Diabetes mellitus without complication [facility])    type 2   Hyperlipidemia associated with type 2 diabetes mellitus (HCC)    Hypertension    New onset atrial fibrillation (HCC)    Severe aortic stenosis 04/26/2017  Past Surgical History:  Procedure Laterality Date   LEFT HEART CATHETERIZATION WITH CORONARY ANGIOGRAM N/A 09/27/2014   Non-obstructive disease, CRF reduction recommended. Procedure: LEFT HEART CATHETERIZATION WITH CORONARY ANGIOGRAM;  Surgeon: Micheline Chapman, MD;  Location: [facility] CATH LAB;  Service: Cardiovascular;  Laterality: N/A;   RIGHT/LEFT HEART CATH AND CORONARY ANGIOGRAPHY N/A 10/30/2018   Procedure: RIGHT/LEFT HEART CATH AND CORONARY ANGIOGRAPHY;  Surgeon: Kathleene Hazel, MD;  Location: [facility] INVASIVE CV LAB;  Service: Cardiovascular;  Laterality: N/A;          Family History  Problem Relation Age of Onset   Transient ischemic attack Mother    Heart attack Brother 36    Social History        Socioeconomic History   Marital status: Widowed    Spouse name: Not on file   Number of children: 4   Years of education: Not on file   Highest education level: Not on file  Occupational History   Occupation: Retired-Secretary  Social Needs   Financial resource strain: Not on file   Food insecurity:    Worry: Not on file    Inability: Not on file   Transportation needs:    Medical: Not on file    Non-medical: Not on file  Tobacco Use   Smoking status: Never Smoker   Smokeless tobacco: Never Used  Substance and Sexual Activity   Alcohol use: No    Alcohol/week: 0.0 standard drinks   Drug use: No   Sexual activity: Not on file  Lifestyle   Physical activity:    Days per week: Not on file    Minutes per session: Not on file   Stress: Not on file  Relationships   Social connections:    Talks on phone: Not on file    Gets together: Not on file    Attends religious service: Not on file    Active member of club or organization: Not on file    Attends meetings of clubs or organizations: Not on file    Relationship status: Not on file   Intimate partner violence:    Fear of current or ex partner: Not on file    Emotionally abused: Not on file    Physically abused: Not on file    Forced sexual activity: Not on file  Other Topics Concern   Not on file  Social History Narrative   Pt lives alone in [place].           Current Outpatient Medications  Medication Sig Dispense Refill   acetaminophen (TYLENOL) 500 MG tablet Take 500 mg by mouth every 6 (six) hours as needed for moderate pain or headache.     allopurinol (ZYLOPRIM) 100 MG tablet Take 100 mg by mouth daily.     aspirin EC 81 MG tablet Take 81 mg by mouth daily.     calcium carbonate (TUMS EX) 750 MG  chewable tablet Chew 1 tablet by mouth daily as needed for heartburn.     ferrous sulfate 325 (65 FE) MG tablet Take 325 mg by mouth daily.     insulin NPH Human (HUMULIN N,NOVOLIN N) 100 UNIT/ML injection Inject 15 Units into the skin 2 (two) times daily.      levalbuterol (XOPENEX HFA) 45 MCG/ACT inhaler Inhale 2 puffs into the lungs every 4 (four) hours as needed for wheezing. 1 Inhaler 2   losartan (COZAAR) 25 MG tablet Take 25 mg by mouth daily.     metFORMIN (GLUCOPHAGE) 1000  MG tablet Take 1,000 mg by mouth 2 (two) times daily with a meal.     metoprolol tartrate (LOPRESSOR) 50 MG tablet Take 50 mg by mouth 2 (two) times daily.     nitroGLYCERIN (NITROSTAT) 0.4 MG SL tablet Place 1 tablet (0.4 mg total) under the tongue every 5 (five) minutes x 3 doses as needed for chest pain (or severe SOB). 25 tablet 2   potassium chloride (MICRO-K) 10 MEQ CR capsule Take 2 capsules (20 mEq total) by mouth daily. (Patient taking differently: Take 10 mEq by mouth daily. ) 60 capsule 2   Prenatal Vit-Fe Fumarate-FA (PRENATAL PO) Take 1 tablet by mouth daily.     torsemide (DEMADEX) 10 MG tablet Take 10 mg by mouth 2 (two) times daily.      No current facility-administered medications for this visit.          Allergies  Allergen Reactions   Codeine Shortness Of Breath and Itching   Epinephrine Shortness Of Breath and Palpitations   Penicillins Anaphylaxis    Did it involve swelling of the face/tongue/throat, SOB, or low BP? Yes Did it involve sudden or severe rash/hives, skin peeling, or any reaction on the inside of your mouth or nose? No Did you need to seek medical attention at a hospital or doctor's office? Yes When did it last happen?40+ years If all above answers are NO, may proceed with cephalosporin use.    Statins Other (See Comments)    Myalgias    Prednisone Other (See Comments)    psychosis    Lidocaine Palpitations   Sulfa  Antibiotics Rash      Review of Systems:              General:                      normal appetite, decreased energy, + weight gain, no weight loss, no fever             Cardiac:                       no chest pain with exertion, no chest pain at rest, +SOB with mild exertion, no resting SOB, no PND, + orthopnea, + palpitations, + arrhythmia, + atrial fibrillation, + LE edema, no dizzy spells, no syncope             Respiratory:                 + shortness of breath, no home oxygen, no productive cough, no dry cough, no bronchitis, no wheezing, no hemoptysis, no asthma, no pain with inspiration or cough, + suspected sleep apnea, no CPAP at night             GI:                               no difficulty swallowing, no reflux, no frequent heartburn, + hiatal hernia, no abdominal pain, no constipation, no diarrhea, no hematochezia, no hematemesis, no melena             GU:                              no dysuria,  no frequency, no urinary tract infection, no hematuria, no kidney stones, no kidney disease  Vascular:                     no pain suggestive of claudication, no pain in feet, no leg cramps, no varicose veins, no DVT, no non-healing foot ulcer             Neuro:                         no stroke, no TIA's, no seizures, no headaches, no temporary blindness one eye,  no slurred speech, + peripheral neuropathy, no chronic pain, no instability of gait, no memory/cognitive dysfunction             Musculoskeletal:         no arthritis, no joint swelling, no myalgias, no difficulty walking, normal mobility              Skin:                            + rash, + itching, no skin infections, + pressure sores or ulcerations             Psych:                         no anxiety, no depression, no nervousness, no unusual recent stress             Eyes:                           + blurry vision, + floaters, no recent vision changes, + wears glasses or contacts             ENT:                             no hearing loss, no loose or painful teeth, no dentures, last saw dentist 10/30/18             Hematologic:               + easy bruising, no abnormal bleeding, no clotting disorder, no frequent epistaxis             Endocrine:                   + diabetes, does check CBG's at home                                                       Physical Exam:              BP 124/80 (BP Location: Right Arm, Patient Position: Sitting, Cuff Size: Large)    Pulse 79    Resp 16    Ht 4\' 9"  (1.448 m)    Wt 93.4 kg    SpO2 95% Comment: ON RA   BMI 44.58 kg/m              General:                      Obese woman in no distress               HEENT:  Unremarkable, NCAT, PERLA, EOMI, oropharynx clear, teeth in good condition             Neck:                           no JVD, no bruits, no adenopathy or thyromegaly             Chest:                          clear to auscultation, symmetrical breath sounds, no wheezes, no rhonchi              CV:                              RRR, grade III/VI crescendo/decrescendo murmur heard best at RSB,  no diastolic murmur             Abdomen:                    soft, non-tender, no masses, panniculus             Extremities:                 warm, well-perfused, pulses palpable in feet, mild LE edema             Rectal/GU                   Deferred             Neuro:                         Grossly non-focal and symmetrical throughout             Skin:                            Clean and dry, no rashes, no breakdown   Diagnostic Tests:  ECHOCARDIOGRAM REPORT     Patient Name: MAYCEE BLASCO Date of Exam: 10/13/2018 Medical Rec #: [id number] Height: 59.0 in Accession #: [id number] Weight: 203.0 lb Date of Birth: October 02, 1940 BSA: 1.86 m Patient Age: 78 years BP: 134/84 mmHg Patient Gender: F HR: 97 bpm. Exam Location: [address]   Procedure: 2D Echo, 3D Echo, Cardiac Doppler and Color Doppler  Indications: I35.0 Aortic Stenosis (Severe)  History: Patient has prior history of Echocardiogram examinations, most recent 04/26/2017. CHF, CAD, Atrial Fibrillation, Aortic Valve Disease and Prior Mean Gradient 49 mmHG; Signs/Symptoms: Dyspnea; Risk Factors: Hypertension, Diabetes, Dyslipidemia and Family History of Coronary Artery Disease. Obesity, Edema, Chest Pain, Severe Aortic Stenosis.  Sonographer: Farrel Conners RDCS Referring Phys: [phone] KENNETH C HILTY  IMPRESSIONS   1. The left ventricle has normal systolic function of 60-65%. The cavity size was normal. There is moderately increased left ventricular wall thickness. Left ventricular diastolic function could not be evaluated secondary to atrial fibrillation. 2. The right ventricle has normal systolic function. The cavity size was normal. There is no increase in right ventricular wall thickness. Right ventricular systolic pressure is moderately elevated with an estimated pressure of 49.8 mmHg. 3. Left atrial size was moderately dilated. 4. There is mild thickening. There is moderate mitral annular calcification present. The MR jet is centrally-directed. Mild mitral valve stenosis. Mean gradient 5 mm Hg at a mean  heart rate of 88 bpm. 5. The tricuspid valve is normal in structure. 6. The aortic valve is tricuspid There is severe thickening and severe calcifcation of the aortic valve, with severely decreased cusp excursion. Aortic valve regurgitation is mild to moderate by color flow Doppler. The calculated aortic valve area is  0.75 cm, consistent with severe stenosis. The mean gradient is 58 mm Hg. 7. The pulmonic valve was normal in structure. 8. No evidence of left ventricular regional wall motion abnormalities.  FINDINGS Left Ventricle: The  left ventricle has normal systolic function of 60-65%. The cavity size was normal. There is moderately increased left ventricular wall thickness. Left ventricular diastology could not be evaluated secondary to atrial fibrillation. No evidence of left ventricular regional wall motion abnormalities.. Right Ventricle: The right ventricle has normal systolic function. The cavity was normal. There is no increase in right ventricular wall thickness. Right ventricular systolic pressure is moderately elevated with an estimated pressure of 49.8 mmHg. Left Atrium: left atrial size was moderately dilated Right Atrium: right atrial size was normal in size Interatrial Septum: No atrial level shunt detected by color flow Doppler.  Pericardium: There is no evidence of pericardial effusion. Mitral Valve: The mitral valve is degenerative in appearance. There is mild thickening. There is moderate mitral annular calcification present. Mitral valve regurgitation is mild by color flow Doppler. The MR jet is centrally-directed. mild mitral valve  stenosis. Tricuspid Valve: The tricuspid valve is normal in structure. Tricuspid valve regurgitation is mild by color flow Doppler. Aortic Valve: The aortic valve is tricuspid There is severe thickening of the aortic valve, with severely decreased cusp excursion. Aortic valve regurgitation is mild to moderate by color flow Doppler. The calculated aortic valve area is 0.75 cm,  consistent with severe stenosis. Pulmonic Valve: The pulmonic valve was normal in structure. Pulmonic valve regurgitation is not visualized by color flow Doppler. Venous: The inferior vena cava is normal in size with greater than 50% respiratory variability.  LEFT VENTRICLE PLAX 2D (Teich) LV EF: 76.5 % Diastology LVIDd: 3.14 cm LV e' lateral: 6.53 cm/s LVIDs: 1.76 cm LV E/e' lateral: 25.1 LV PW: 1.46 cm LV e' medial: 5.00 cm/s LV IVS: 1.71 cm  LV E/e' medial: 32.8 LVOT diam: 2.30 cm LV SV: 30 ml LVOT Area: 4.15 cm  RIGHT VENTRICLE RV S prime: 11.20 cm/s TAPSE (M-mode): 1.5 cm RVSP: 49.8 mmHg  LEFT ATRIUM Index RIGHT ATRIUM Index LA diam: 4.90 cm 2.64 cm/m RA Pressure: 3 mmHg LA Vol (A2C): 79.3 ml 42.72 ml/m RA Area: 17.10 cm LA Vol (A4C): 106.0 ml 57.11 ml/m RA Volume: 47.20 ml 25.43 ml/m LA Biplane Vol: 92.5 ml 49.84 ml/m AORTIC VALVE AV Area (Vmax): 0.79 cm AV Area (Vmean): 0.71 cm AV Area (VTI): 0.75 cm AV Vmax: 500.00 cm/s AV Vmean: 351.200 cm/s AV VTI: 1.222 m AV Peak Grad: 100.0 mmHg AV Mean Grad: 57.8 mmHg LVOT Vmax: 94.50 cm/s LVOT Vmean: 59.900 cm/s LVOT VTI: 0.221 m LVOT/AV VTI ratio: 0.18 AR PHT: 445 msec  AORTA Ao Root diam: 3.00 cm Ao Asc diam: 3.50 cm  MITRAL VALVE TR Peak grad: 46.8 mmHg MV Area (PHT): 2.82 cm TR Vmax: 347.00 cm/s MV Peak grad: 16.5 mmHg RVSP: 49.8 mmHg MV Mean grad: 5.0 mmHg MV Vmax: 2.03 m/s MV Vmean: 97.7 cm/s MV VTI: 0.46 m MV PHT: 78 msec MV Decel Time: 338 msec MR Peak grad: 199.4 mmHg MR Mean grad: 133.0 mmHg MR Vmax: 706.00 cm/s MR Vmean: 544.0 cm/s MR PISA:  1.01 MV E velocity: 164.0Thurmon Fairhai Croitoru MD Electronically signed by Thurmon Fair MD Signature Date/Time: 10/13/2018/1:09:23 PM   Physicians   Panel Physicians Referring Physician Case Authorizing Physician  Kathleene Hazel, MD (Primary)    Procedures   RIGHT/LEFT HEART CATH AND CORONARY ANGIOGRAPHY  Conclusion     Prox RCA to Mid RCA lesion is 10% stenosed.  Mid RCA to Dist RCA lesion is 10% stenosed.  Mid LM to Dist LM lesion is 20% stenosed.  Ost Cx to Prox Cx lesion is 40% stenosed.  Ost 2nd Mrg lesion  is 30% stenosed.  Prox LAD lesion is 40% stenosed.  Mid LAD to Dist LAD lesion is 30% stenosed.  1. Mild to moderate non-obstructive CAD 2. The mid LAD has a moderate stenosis. Unchanged from cath in 2016 3. Mild to moderate ostial Circumflex stenosis.  4. Large dominant RCA with luminal irregularities.  5. Severe aortic stenosis (mean gradient 43 mmHg, peak to peak gradient 65 mmhg, AVA 0.64 cm2).   Recommendations: Medical management of CAD. Continue workup for TAVR.    Indications   Severe aortic stenosis [I35.0 (ICD-10-CM)]  Procedural Details   Technical Details Indication: 78 yo female with CAD, atrial fibrillation, severe AS. Cardiac cath in workup for TAVR.   Procedure: The risks, benefits, complications, treatment options, and expected outcomes were discussed with the patient. The patient and/or family concurred with the proposed plan, giving informed consent. The patient was brought to the cath lab after IV hydration was given. The patient was sedated with Versed and Fentanyl. I changed out the IV catheter in the right antecubital vein for a 5 French sheath. Right heart cath performed with a balloon tipped catheter. The right wrist was prepped and draped in a sterile fashion. 1% lidocaine was used for local anesthesia. Using the modified Seldinger access technique, a 5 French sheath was placed in the right radial artery. 3 mg Verapamil was given through the sheath. 4500 units IV heparin was given. Standard diagnostic catheters were used to perform selective coronary angiography. I crossed the aortic valve with an AL-1 catheter and a straight wire. The sheath was removed from the right radial artery and a Terumo hemostasis band was applied at the arteriotomy site on the right wrist.    Estimated blood loss <50 mL.   During this procedure medications were administered to achieve and maintain moderate conscious sedation while the patient's heart rate, blood pressure, and oxygen  saturation were continuously monitored and I was present face-to-face 100% of this time.  Medications  (Filter: Administrations occurring from 10/30/18 0718 to 10/30/18 0826)          Medication Rate/Dose/Volume Action  Date Time   lidocaine (PF) (XYLOCAINE) 1 % injection (mL) 2 mL Given 10/30/18 0753   Total dose as of 11/12/18 1545 2 mL Given 0759   4 mL        Radial Cocktail/Verapamil only (mL) 10 mL Given 10/30/18 0801   Total dose as of 11/12/18 1545        10 mL        fentaNYL (SUBLIMAZE) injection (mcg) 25 mcg Given 10/30/18 0750   Total dose as of 11/12/18 1545        25 mcg        midazolam (VERSED) injection (mg) 1 mg Given 10/30/18 0750   Total dose as of 11/12/18 1545        1 mg  heparin injection (Units) 4,500 Units Given 10/30/18 0804   Total dose as of 11/12/18 1545        4,500 Units        Heparin (Porcine) in NaCl 1000-0.9 UT/500ML-% SOLN (mL) 500 mL Given 10/30/18 0804   Total dose as of 11/12/18 1545 500 mL Given 0804   1,000 mL        iohexol (OMNIPAQUE) 350 MG/ML injection (mL) 40 mL Given 10/30/18 0816   Total dose as of 11/12/18 1545        40 mL        Sedation Time   Sedation Time Physician-1: 24 minutes 52 seconds  Complications   Complications documented before study signed (10/30/2018 8:41 AM EST)    RIGHT/LEFT HEART CATH AND CORONARY ANGIOGRAPHY   None Documented by Kathleene Hazel, MD 10/30/2018 8:30 AM EST  Time Range: Intraprocedure      Coronary Findings   Diagnostic  Dominance: Right  Left Main  Mid LM to Dist LM lesion 20% stenosed  Mid LM to Dist LM lesion is 20% stenosed. The lesion is eccentric.  Left Anterior Descending  Vessel is large.  Prox LAD lesion 40% stenosed  Prox LAD lesion is 40% stenosed.  Mid LAD to Dist LAD lesion 30% stenosed  Mid LAD to Dist LAD lesion is 30% stenosed.  Left Circumflex  Vessel is large.    Ost Cx to Prox Cx lesion 40% stenosed  Ost Cx to Prox Cx lesion is 40% stenosed.  Second Obtuse Marginal Branch  Vessel is moderate in size.  Ost 2nd Mrg lesion 30% stenosed  Ost 2nd Mrg lesion is 30% stenosed.  Right Coronary Artery  Vessel is large.  Prox RCA to Mid RCA lesion 10% stenosed  Prox RCA to Mid RCA lesion is 10% stenosed.  Mid RCA to Dist RCA lesion 10% stenosed  Mid RCA to Dist RCA lesion is 10% stenosed.  Intervention   No interventions have been documented.  Coronary Diagrams   Diagnostic  Dominance: Right    Intervention   Implants    No implant documentation for this case.  Syngo Images      Show images for CARDIAC CATHETERIZATION  MERGE Images   Show images for CARDIAC CATHETERIZATION   Link to Procedure Log   Procedure Log    Hemo Data    Most Recent Value  Fick Cardiac Output 4.22 L/min  Fick Cardiac Output Index 2.33 (L/min)/BSA  Aortic Mean Gradient 42.98 mmHg  Aortic Peak Gradient 65 mmHg  Aortic Valve Area 0.64  Aortic Value Area Index 0.35 cm2/BSA  RA A Wave 7 mmHg  RA V Wave 5 mmHg  RA Mean 4 mmHg  RV Systolic Pressure 34 mmHg  RV Diastolic Pressure 2 mmHg  RV EDP 4 mmHg  PA Systolic Pressure 37 mmHg  PA Diastolic Pressure 19 mmHg  PA Mean 23 mmHg  PW A Wave 3 mmHg  PW V Wave 4 mmHg  PW Mean 2 mmHg  AO Systolic Pressure 91 mmHg  AO Diastolic Pressure 52 mmHg  AO Mean 70 mmHg  LV Systolic Pressure 158 mmHg  LV Diastolic Pressure 2 mmHg  LV EDP 8 mmHg  AOp Systolic Pressure 93 mmHg  AOp Diastolic Pressure 48 mmHg  AOp Mean Pressure 65 mmHg  LVp Systolic Pressure 158 mmHg  LVp Diastolic Pressure 3 mmHg  LVp EDP Pressure 8 mmHg  QP/QS 1  TPVR Index 9.86 HRUI  TSVR Index 30.01 HRUI  PVR  SVR Ratio 0.32  TPVR/TSVR Ratio 0.33    ADDENDUM REPORT: 11/05/2018 15:30  EXAM: OVER-READ INTERPRETATION CT CHEST  The following report is an over-read performed by radiologist Dr. Royal Piedra [facility] on 11/05/2018. This over-read does not include interpretation of cardiac or coronary anatomy or pathology. The cardiac CTA interpretation by the cardiologist is attached.  COMPARISON: None.  FINDINGS: Extracardiac findings are described separately under dictation for contemporaneously obtained CTA chest, abdomen and pelvis.  IMPRESSION: Please see separate dictation for contemporaneously obtained CTA chest, abdomen and pelvis dated 11/05/2018 for full description of relevant extracardiac findings.   Electronically Signed By: Trudie Reed M.D. On: 11/05/2018 15:30   Addended by Florencia Reasons, MD on 11/05/2018 3:32 PM    Study Result   CLINICAL DATA: Aortic Stenosis  EXAM: Cardiac TAVR CT  TECHNIQUE: The patient was scanned on a Siemens Force 192 slice scanner. A 120 kV retrospective scan was triggered in the ascending thoracic aorta at 140 HU's. Gantry rotation speed was 250 msecs and collimation was .6 mm. No beta blockade or nitro were given. The 3D data set was reconstructed in 5% intervals of the R-R cycle. Systolic and diastolic phases were analyzed on a dedicated work station using MPR, MIP and VRT modes. The patient received 80 cc of contrast.  FINDINGS: Aortic Valve: Tri leaflet calcified with restricted leaflet motion  Aorta: Normal diameter no aneurysm moderate calcific atherosclerosis  Sino-tubular Junction: 27 mm  Ascending Thoracic Aorta: 34 mm  Aortic Arch: 26 mm  Descending Thoracic Aorta: 23 mm  Sinus of Valsalva Measurements:  Non-coronary: 27.5 mm  Right - coronary: 28.6 mm  Left - coronary: 27.6 mm  Coronary Artery Height above Annulus:  Left Main: 12.8 mm above annulus  Right Coronary: 15.1 mm above annulus  Virtual Basal Annulus Measurements:  Maximum / Minimum Diameter: 25 mm x 17.4 mm  Perimeter: 69 mm  Area: 361 mm2  Coronary Arteries: Sufficient height above annulus  for deployment  Optimum Fluoroscopic Angle for Delivery: LAO 2 Caudal 4 degrees  IMPRESSION: 1. Calcified tri leaflet AV with annular area of 361 mm2 suitable for a 23 mm Sapien 3 valve  2. Normal aortic root 3.4 cm  3. Optimum angiographic angle for deployment LAO 2 Caudal 4 degrees  4. Coronary arteries sufficient height above annulus for deployment  Charlton Haws  Electronically Signed: By: Charlton Haws M.D. On: 11/05/2018 12:34       CLINICAL DATA: 78 year old female with history of severe aortic stenosis. Preprocedural study prior to potential transcatheter aortic valve replacement (TAVR) procedure.  EXAM: CT ANGIOGRAPHY CHEST, ABDOMEN AND PELVIS  TECHNIQUE: Multidetector CT imaging through the chest, abdomen and pelvis was performed using the standard protocol during bolus administration of intravenous contrast. Multiplanar reconstructed images and MIPs were obtained and reviewed to evaluate the vascular anatomy.  CONTRAST: OMNIPAQUE IOHEXOL 350 MG/ML SOLN  COMPARISON: CT the abdomen and pelvis 04/27/2017.  FINDINGS: CTA CHEST FINDINGS  Cardiovascular: Heart size is enlarged with left atrial dilatation. There is no significant pericardial fluid, thickening or pericardial calcification. There is aortic atherosclerosis, as well as atherosclerosis of the great vessels of the mediastinum and the coronary arteries, including calcified atherosclerotic plaque in the left main, left anterior descending and right coronary arteries. Severe thickening calcification of the aortic valve. Moderate to severe calcifications of the mitral annulus.  Mediastinum/Lymph Nodes: No pathologically enlarged mediastinal or hilar lymph nodes. Esophagus is unremarkable in appearance. No axillary lymphadenopathy.  Lungs/Pleura: No  acute consolidative airspace disease. No pleural effusions. No suspicious appearing pulmonary nodules or masses  are noted.  Musculoskeletal/Soft Tissues: There are no aggressive appearing lytic or blastic lesions noted in the visualized portions of the skeleton.  CTA ABDOMEN AND PELVIS FINDINGS  Hepatobiliary: Liver has a shrunken appearance and nodular contour, indicative of cirrhosis. In segment 3 of the liver there is a 1.1 x 1.4 cm hypervascular area (axial image 86 of series 18). No intra or extrahepatic biliary ductal dilatation. Gallbladder is normal in appearance.  Pancreas: No pancreatic mass. No pancreatic ductal dilatation. No pancreatic or peripancreatic fluid or inflammatory changes.  Spleen: Wedge-shaped area of hypoperfusion in the spleen concerning for potential infarct (axial image 83 of series 18).  Adrenals/Urinary Tract: Bilateral kidneys and bilateral adrenal glands are normal in appearance. No hydroureteronephrosis. Urinary bladder is normal in appearance.  Stomach/Bowel: Normal appearance of the stomach. No pathologic dilatation of small bowel or colon. The appendix is not confidently identified and may be surgically absent. Regardless, there are no inflammatory changes noted adjacent to the cecum to suggest the presence of an acute appendicitis at this time.  Vascular/Lymphatic: Aortic atherosclerosis, without evidence of aneurysm or dissection in the abdominal or pelvic vasculature. Vascular findings and measurements pertinent to potential TAVR procedure, as detailed below. No lymphadenopathy noted in the abdomen or pelvis.  Reproductive: Uterus and ovaries are unremarkable in appearance.  Other: No significant volume of ascites. No pneumoperitoneum.  Musculoskeletal: There are no aggressive appearing lytic or blastic lesions noted in the visualized portions of the skeleton.  VASCULAR MEASUREMENTS PERTINENT TO TAVR:  AORTA:  Minimal Aortic Diameter-12 x 9 mm  Severity of Aortic Calcification-moderate  RIGHT PELVIS:  Right Common  Iliac Artery -  Minimal Diameter-6.8 x 6.2 mm  Tortuosity-mild  Calcification-moderate  Right External Iliac Artery -  Minimal Diameter-5.8 x 5.8 mm  Tortuosity-moderate  Calcification-none  Right Common Femoral Artery -  Minimal Diameter-6.1 x 6.1 mm  Tortuosity-mild  Calcification-mild  LEFT PELVIS:  Left Common Iliac Artery -  Minimal Diameter-6.2 x 5.1 mm  Tortuosity-mild  Calcification-moderate  Left External Iliac Artery -  Minimal Diameter-6.6 x 6.4 mm  Tortuosity-moderate  Calcification-none  Left Common Femoral Artery -  Minimal Diameter-6.0 x 5.8 mm  Tortuosity-mild  Calcification-mild  Review of the MIP images confirms the above findings.  IMPRESSION: 1. Vascular findings and measurements pertinent to potential TAVR procedure, as detailed above. 2. Thickening calcification of the aortic valve, compatible with the reported clinical history of severe aortic stenosis. 3. Aortic atherosclerosis, in addition to left main and 2 vessel coronary artery disease. Assessment for potential risk factor modification, dietary therapy or pharmacologic therapy may be warranted, if clinically indicated. 4. There is also moderate to severe calcifications of the mitral annulus. 5. The liver is cirrhotic, and there is a hypervascular area in segment 3 of the liver. Although this is favored to represent a perfusion anomaly, given the cirrhosis, further evaluation with nonemergent abdominal MRI with and without IV gadolinium is strongly recommended in the near future to better characterize this finding, as the possibility of hepatocellular carcinoma is not excluded. 6. Wedge-shaped area of hypoperfusion in the spleen concerning for potential splenic infarct. This could alternatively be related to the early phase of enhancement. Attention at time of forthcoming abdominal MRI is recommended to better evaluate this finding during a  multiphase examination.   Electronically Signed By: Trudie Reed M.D. On: 11/05/2018 15:29  STS Risk Score: AVR Risk of Mortality: 4.868%  Renal Failure: 6.049%  Permanent Stroke: 1.404%  Prolonged Ventilation: 15.695%  DSW Infection: 0.282%  Reoperation: 3.528%  Morbidity or Mortality: 24.729%  Short Length of Stay: 15.728% Long Length of Stay: 14.245%  Impression:  This 78 year old woman has stage D, critical, symptomatic aortic stenosis with New York Heart Association class III symptoms of exertional fatigue and shortness of breath as well as orthopnea and peripheral edema consistent with chronic diastolic congestive heart failure.  I have personally reviewed her 2D echocardiogram, cardiac catheterization, and CTA studies.  Her echocardiogram shows a trileaflet aortic valve with severe thickening and calcification of the valve leaflets with severe restriction of mobility.  The mean gradient is 58 mmHg consistent with critical aortic stenosis.  There is also moderate aortic insufficiency with an AI pressure half-time of 445 ms.  Left ventricular systolic function is normal.  Cardiac catheterization shows mild to moderate nonobstructive coronary disease.  The mean gradient was measured at 43 mmHg with a peak to peak gradient of 65 mmHg and an aortic valve area of 0.64 cm.  I agree that aortic valve replacement is indicated in this patient with progressive symptoms that are markedly limiting her quality of life and activity level.  She would be a moderate risk patient for open surgical aortic valve replacement and I think transcatheter aortic valve replacement would be the best option for her at 78 years old.  Her gated cardiac CTA shows anatomy suitable for transcatheter aortic valve replacement using a 23 mm Sapien 3 valve.  Her BSA is 1.86 m so this valve should be adequate for her.  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  Her abdominal and pelvic CTA shows adequate pelvic vascular anatomy to allow transfemoral insertion.    The patient and her two daughters were counseled at length regarding treatment alternatives for management of severe symptomatic aortic stenosis. The risks and benefits of surgical intervention has been discussed in detail. Long-term prognosis with medical therapy was discussed. Alternative approaches such as conventional surgical aortic valve replacement, transcatheter aortic valve replacement, and palliative medical therapy were compared and contrasted at length. This discussion was placed in the context of the patient's own specific clinical presentation and past medical history. All of their questions have been addressed.    Following the decision to proceed with transcatheter aortic valve replacement, a discussion was held regarding what types of management strategies would be attempted intraoperatively in the event of life-threatening complications, including whether or not the patient would be considered a candidate for the use of cardiopulmonary bypass and/or conversion to open sternotomy for attempted surgical intervention. The patient is aware of the fact that transient use of cardiopulmonary bypass may be necessary.  I think she would be a candidate for emergent sternotomy if needed to manage any intraoperative complications.  The patient has been advised of a variety of complications that might develop including but not limited to risks of death, stroke, paravalvular leak, aortic dissection or other major vascular complications, aortic annulus rupture, device embolization, cardiac rupture or perforation, mitral regurgitation, acute myocardial infarction, arrhythmia, heart block or bradycardia requiring permanent pacemaker placement, congestive heart failure, respiratory failure,  renal failure, pneumonia, infection, other late complications related to structural valve deterioration or migration, or other complications that might ultimately cause a temporary or permanent loss of functional independence or other  long term morbidity. The patient provides full informed consent for the procedure as described and all questions were answered.       Plan:  Transfemoral transcatheter aortic valve replacement using a Sapien 3 valve.   Alleen Borne  MD

## 2018-11-17 NOTE — Progress Notes (Signed)
Bloodwork drawn at PAT appointment abnormal  BNP 262.8  Dr. Clifton James notified via message.

## 2018-11-18 ENCOUNTER — Inpatient Hospital Stay (HOSPITAL_COMMUNITY): Payer: Medicare HMO

## 2018-11-18 ENCOUNTER — Encounter (HOSPITAL_COMMUNITY): Payer: Self-pay

## 2018-11-18 ENCOUNTER — Inpatient Hospital Stay (HOSPITAL_COMMUNITY)
Admission: RE | Admit: 2018-11-18 | Discharge: 2018-11-19 | DRG: 266 | Disposition: A | Discharge status: Home / Self Care | Payer: Medicare HMO | Attending: Cardiovascular Disease | Admitting: Cardiovascular Disease

## 2018-11-18 ENCOUNTER — Encounter (HOSPITAL_COMMUNITY)
Admission: RE | Disposition: A | Discharge status: Home / Self Care | Payer: Self-pay | Source: Home / Self Care | Attending: Cardiovascular Disease

## 2018-11-18 ENCOUNTER — Inpatient Hospital Stay (HOSPITAL_COMMUNITY): Payer: Medicare HMO | Admitting: Vascular Surgery

## 2018-11-18 ENCOUNTER — Other Ambulatory Visit: Payer: Self-pay

## 2018-11-18 DIAGNOSIS — Z7982 Long term (current) use of aspirin: Secondary | ICD-10-CM | POA: Diagnosis not present

## 2018-11-18 DIAGNOSIS — Z6841 Body Mass Index (BMI) 40.0 and over, adult: Secondary | ICD-10-CM

## 2018-11-18 DIAGNOSIS — I361 Nonrheumatic tricuspid (valve) insufficiency: Secondary | ICD-10-CM | POA: Diagnosis not present

## 2018-11-18 DIAGNOSIS — I110 Hypertensive heart disease with heart failure: Secondary | ICD-10-CM | POA: Diagnosis present

## 2018-11-18 DIAGNOSIS — I5033 Acute on chronic diastolic (congestive) heart failure: Secondary | ICD-10-CM | POA: Diagnosis present

## 2018-11-18 DIAGNOSIS — Z880 Allergy status to penicillin: Secondary | ICD-10-CM

## 2018-11-18 DIAGNOSIS — Z79899 Other long term (current) drug therapy: Secondary | ICD-10-CM

## 2018-11-18 DIAGNOSIS — Z8249 Family history of ischemic heart disease and other diseases of the circulatory system: Secondary | ICD-10-CM | POA: Diagnosis not present

## 2018-11-18 DIAGNOSIS — Z006 Encounter for examination for normal comparison and control in clinical research program: Secondary | ICD-10-CM | POA: Diagnosis not present

## 2018-11-18 DIAGNOSIS — Z952 Presence of prosthetic heart valve: Secondary | ICD-10-CM

## 2018-11-18 DIAGNOSIS — I5032 Chronic diastolic (congestive) heart failure: Secondary | ICD-10-CM

## 2018-11-18 DIAGNOSIS — I4819 Other persistent atrial fibrillation: Secondary | ICD-10-CM | POA: Diagnosis present

## 2018-11-18 DIAGNOSIS — I10 Essential (primary) hypertension: Secondary | ICD-10-CM | POA: Diagnosis present

## 2018-11-18 DIAGNOSIS — E785 Hyperlipidemia, unspecified: Secondary | ICD-10-CM | POA: Diagnosis present

## 2018-11-18 DIAGNOSIS — D509 Iron deficiency anemia, unspecified: Secondary | ICD-10-CM | POA: Diagnosis present

## 2018-11-18 DIAGNOSIS — K7460 Unspecified cirrhosis of liver: Secondary | ICD-10-CM | POA: Diagnosis present

## 2018-11-18 DIAGNOSIS — I700 Atherosclerosis of aorta: Secondary | ICD-10-CM | POA: Diagnosis present

## 2018-11-18 DIAGNOSIS — I350 Nonrheumatic aortic (valve) stenosis: Principal | ICD-10-CM

## 2018-11-18 DIAGNOSIS — Z885 Allergy status to narcotic agent status: Secondary | ICD-10-CM | POA: Diagnosis not present

## 2018-11-18 DIAGNOSIS — E119 Type 2 diabetes mellitus without complications: Secondary | ICD-10-CM

## 2018-11-18 DIAGNOSIS — I2510 Atherosclerotic heart disease of native coronary artery without angina pectoris: Secondary | ICD-10-CM | POA: Diagnosis present

## 2018-11-18 DIAGNOSIS — Z794 Long term (current) use of insulin: Secondary | ICD-10-CM

## 2018-11-18 DIAGNOSIS — I351 Nonrheumatic aortic (valve) insufficiency: Secondary | ICD-10-CM | POA: Diagnosis not present

## 2018-11-18 DIAGNOSIS — E1169 Type 2 diabetes mellitus with other specified complication: Secondary | ICD-10-CM | POA: Diagnosis present

## 2018-11-18 DIAGNOSIS — E669 Obesity, unspecified: Secondary | ICD-10-CM | POA: Diagnosis present

## 2018-11-18 HISTORY — DX: Presence of prosthetic heart valve: Z95.2

## 2018-11-18 HISTORY — DX: Hyperlipidemia, unspecified: E78.5

## 2018-11-18 HISTORY — DX: Other persistent atrial fibrillation: I48.19

## 2018-11-18 HISTORY — DX: Chronic diastolic (congestive) heart failure: I50.32

## 2018-11-18 HISTORY — PX: TRANSCATHETER AORTIC VALVE REPLACEMENT, TRANSFEMORAL: SHX6400

## 2018-11-18 HISTORY — PX: TEE WITHOUT CARDIOVERSION: SHX5443

## 2018-11-18 LAB — POCT I-STAT 7, (LYTES, BLD GAS, ICA,H+H)
Acid-base deficit: 4 mmol/L — ABNORMAL HIGH (ref 0.0–2.0)
Bicarbonate: 22.2 mmol/L (ref 20.0–28.0)
Calcium, Ion: 1.19 mmol/L (ref 1.15–1.40)
HCT: 28 % — ABNORMAL LOW (ref 36.0–46.0)
Hemoglobin: 9.5 g/dL — ABNORMAL LOW (ref 12.0–15.0)
O2 Saturation: 96 %
Potassium: 4.1 mmol/L (ref 3.5–5.1)
Sodium: 143 mmol/L (ref 135–145)
TCO2: 24 mmol/L (ref 22–32)
pCO2 arterial: 45 mmHg (ref 32.0–48.0)
pH, Arterial: 7.3 — ABNORMAL LOW (ref 7.350–7.450)
pO2, Arterial: 94 mmHg (ref 83.0–108.0)

## 2018-11-18 LAB — POCT I-STAT 4, (NA,K, GLUC, HGB,HCT)
Glucose, Bld: 136 mg/dL — ABNORMAL HIGH (ref 70–99)
HCT: 28 % — ABNORMAL LOW (ref 36.0–46.0)
Hemoglobin: 9.5 g/dL — ABNORMAL LOW (ref 12.0–15.0)
Potassium: 3.8 mmol/L (ref 3.5–5.1)
Sodium: 144 mmol/L (ref 135–145)

## 2018-11-18 LAB — GLUCOSE, CAPILLARY
Glucose-Capillary: 143 mg/dL — ABNORMAL HIGH (ref 70–99)
Glucose-Capillary: 124 mg/dL — ABNORMAL HIGH (ref 70–99)
Glucose-Capillary: 140 mg/dL — ABNORMAL HIGH (ref 70–99)
Glucose-Capillary: 193 mg/dL — ABNORMAL HIGH (ref 70–99)
Glucose-Capillary: 336 mg/dL — ABNORMAL HIGH (ref 70–99)

## 2018-11-18 LAB — POCT ACTIVATED CLOTTING TIME
Activated Clotting Time: 212 seconds
Activated Clotting Time: 260 seconds
Activated Clotting Time: 115 seconds

## 2018-11-18 LAB — IRON AND TIBC
Iron: 60 ug/dL (ref 28–170)
Saturation Ratios: 16 % (ref 10.4–31.8)
TIBC: 382 ug/dL (ref 250–450)
UIBC: 322 ug/dL

## 2018-11-18 LAB — FERRITIN: Ferritin: 152 ng/mL (ref 11–307)

## 2018-11-18 LAB — POCT I-STAT CREATININE: Creatinine, Ser: 0.8 mg/dL (ref 0.44–1.00)

## 2018-11-18 SURGERY — IMPLANTATION, AORTIC VALVE, TRANSCATHETER, FEMORAL APPROACH
Anesthesia: General

## 2018-11-18 MED ORDER — HEPARIN (PORCINE) IN NACL 1000-0.9 UT/500ML-% IV SOLN
INTRAVENOUS | Status: DC | PRN
  Administered 2018-11-18: 500 mL

## 2018-11-18 MED ORDER — ROCURONIUM BROMIDE 50 MG/5ML IV SOSY
PREFILLED_SYRINGE | INTRAVENOUS | Status: DC | PRN
  Administered 2018-11-18: 70 mg via INTRAVENOUS

## 2018-11-18 MED ORDER — SUGAMMADEX SODIUM 200 MG/2ML IV SOLN
INTRAVENOUS | Status: DC | PRN
  Administered 2018-11-18: 200 mg via INTRAVENOUS

## 2018-11-18 MED ORDER — CHLORHEXIDINE GLUCONATE 0.12 % MT SOLN
15.0000 mL | Freq: Once | OROMUCOSAL | Status: AC
  Administered 2018-11-18: 15 mL via OROMUCOSAL
  Filled 2018-11-18 (×2): qty 15

## 2018-11-18 MED ORDER — ONDANSETRON HCL 4 MG/2ML IJ SOLN
INTRAMUSCULAR | Status: DC | PRN
  Administered 2018-11-18: 4 mg via INTRAVENOUS

## 2018-11-18 MED ORDER — LIDOCAINE HCL (PF) 1 % IJ SOLN
INTRAMUSCULAR | Status: AC
  Filled 2018-11-18: qty 30

## 2018-11-18 MED ORDER — VANCOMYCIN HCL 10 G IV SOLR
1500.0000 mg | INTRAVENOUS | Status: AC
  Administered 2018-11-18: 1500 mg via INTRAVENOUS
  Filled 2018-11-18 (×2): qty 1500

## 2018-11-18 MED ORDER — LIDOCAINE 2% (20 MG/ML) 5 ML SYRINGE
INTRAMUSCULAR | Status: DC | PRN
  Administered 2018-11-18: 70 mg via INTRAVENOUS

## 2018-11-18 MED ORDER — INSULIN ASPART 100 UNIT/ML ~~LOC~~ SOLN
0.0000 [IU] | Freq: Three times a day (TID) | SUBCUTANEOUS | Status: DC
  Administered 2018-11-18: 16 [IU] via SUBCUTANEOUS
  Administered 2018-11-18: 4 [IU] via SUBCUTANEOUS
  Administered 2018-11-19: 2 [IU] via SUBCUTANEOUS
  Administered 2018-11-19: 4 [IU] via SUBCUTANEOUS

## 2018-11-18 MED ORDER — PHENYLEPHRINE HCL-NACL 20-0.9 MG/250ML-% IV SOLN
0.0000 ug/min | INTRAVENOUS | Status: DC
  Filled 2018-11-18: qty 250

## 2018-11-18 MED ORDER — SODIUM CHLORIDE 0.9% FLUSH
3.0000 mL | Freq: Two times a day (BID) | INTRAVENOUS | Status: DC
  Administered 2018-11-18 – 2018-11-19 (×2): 3 mL via INTRAVENOUS

## 2018-11-18 MED ORDER — CLOPIDOGREL BISULFATE 75 MG PO TABS
75.0000 mg | ORAL_TABLET | Freq: Every day | ORAL | Status: DC
  Filled 2018-11-18: qty 1

## 2018-11-18 MED ORDER — CHLORHEXIDINE GLUCONATE 4 % EX LIQD
60.0000 mL | Freq: Once | CUTANEOUS | Status: DC
  Filled 2018-11-18: qty 60

## 2018-11-18 MED ORDER — MUPIROCIN 2 % EX OINT
TOPICAL_OINTMENT | CUTANEOUS | Status: AC
  Administered 2018-11-18: 08:00:00
  Filled 2018-11-18: qty 22

## 2018-11-18 MED ORDER — PROPOFOL 10 MG/ML IV BOLUS
INTRAVENOUS | Status: DC | PRN
  Administered 2018-11-18: 140 mg via INTRAVENOUS

## 2018-11-18 MED ORDER — VANCOMYCIN HCL 1000 MG IV SOLR
1000.0000 mg | Freq: Once | INTRAVENOUS | Status: AC
  Administered 2018-11-18: 1000 mg via INTRAVENOUS
  Filled 2018-11-18: qty 1000

## 2018-11-18 MED ORDER — DEXAMETHASONE SODIUM PHOSPHATE 10 MG/ML IJ SOLN
INTRAMUSCULAR | Status: DC | PRN
  Administered 2018-11-18: 5 mg via INTRAVENOUS

## 2018-11-18 MED ORDER — ACETAMINOPHEN 325 MG PO TABS: 650.0000 mg | ORAL_TABLET | Freq: Four times a day (QID) | ORAL | Status: DC | PRN

## 2018-11-18 MED ORDER — VANCOMYCIN HCL IN DEXTROSE 1-5 GM/200ML-% IV SOLN
1000.0000 mg | Freq: Once | INTRAVENOUS | Status: DC
  Filled 2018-11-18: qty 200

## 2018-11-18 MED ORDER — SODIUM CHLORIDE 0.9 % IV SOLN
INTRAVENOUS | Status: DC
  Administered 2018-11-18: 08:00:00 via INTRAVENOUS

## 2018-11-18 MED ORDER — IOHEXOL 350 MG/ML SOLN
INTRAVENOUS | Status: DC | PRN
  Administered 2018-11-18: 80 mL via INTRA_ARTERIAL

## 2018-11-18 MED ORDER — FERROUS SULFATE 325 (65 FE) MG PO TABS
325.0000 mg | ORAL_TABLET | Freq: Every day | ORAL | Status: DC
  Administered 2018-11-18 – 2018-11-19 (×2): 325 mg via ORAL
  Filled 2018-11-18 (×2): qty 1

## 2018-11-18 MED ORDER — ALLOPURINOL 100 MG PO TABS
100.0000 mg | ORAL_TABLET | Freq: Every day | ORAL | Status: DC
  Administered 2018-11-18 – 2018-11-19 (×2): 100 mg via ORAL
  Filled 2018-11-18 (×2): qty 1

## 2018-11-18 MED ORDER — ONDANSETRON HCL 4 MG/2ML IJ SOLN: 4.0000 mg | Freq: Four times a day (QID) | INTRAMUSCULAR | Status: DC | PRN

## 2018-11-18 MED ORDER — LEVOFLOXACIN IN D5W 750 MG/150ML IV SOLN
750.0000 mg | INTRAVENOUS | Status: AC
  Administered 2018-11-19: 750 mg via INTRAVENOUS
  Filled 2018-11-18: qty 150

## 2018-11-18 MED ORDER — SODIUM CHLORIDE 0.9 % IV SOLN
250.0000 mL | INTRAVENOUS | Status: DC | PRN
  Administered 2018-11-19: 250 mL via INTRAVENOUS

## 2018-11-18 MED ORDER — PHENYLEPHRINE 40 MCG/ML (10ML) SYRINGE FOR IV PUSH (FOR BLOOD PRESSURE SUPPORT)
PREFILLED_SYRINGE | INTRAVENOUS | Status: DC | PRN
  Administered 2018-11-18 (×4): 80 ug via INTRAVENOUS

## 2018-11-18 MED ORDER — ACETAMINOPHEN 650 MG RE SUPP: 650.0000 mg | Freq: Four times a day (QID) | RECTAL | Status: DC | PRN

## 2018-11-18 MED ORDER — FENTANYL CITRATE (PF) 100 MCG/2ML IJ SOLN
INTRAMUSCULAR | Status: DC | PRN
  Administered 2018-11-18: 100 ug via INTRAVENOUS

## 2018-11-18 MED ORDER — HEPARIN SODIUM (PORCINE) 1000 UNIT/ML IJ SOLN
INTRAMUSCULAR | Status: DC | PRN
  Administered 2018-11-18: 12000 [IU] via INTRAVENOUS
  Administered 2018-11-18: 5000 [IU] via INTRAVENOUS

## 2018-11-18 MED ORDER — SODIUM CHLORIDE 0.9% FLUSH: 3.0000 mL | INTRAVENOUS | Status: DC | PRN

## 2018-11-18 MED ORDER — MUPIROCIN 2 % EX OINT
1.0000 "application " | TOPICAL_OINTMENT | Freq: Two times a day (BID) | CUTANEOUS | Status: DC
  Administered 2018-11-18 – 2018-11-19 (×2): 1 via NASAL

## 2018-11-18 MED ORDER — CHLORHEXIDINE GLUCONATE CLOTH 2 % EX PADS
6.0000 | MEDICATED_PAD | Freq: Every day | CUTANEOUS | Status: DC
  Administered 2018-11-19: 6 via TOPICAL

## 2018-11-18 MED ORDER — CHLORHEXIDINE GLUCONATE 4 % EX LIQD
30.0000 mL | CUTANEOUS | Status: DC
  Filled 2018-11-18: qty 30

## 2018-11-18 MED ORDER — NITROGLYCERIN IN D5W 200-5 MCG/ML-% IV SOLN: 0.0000 ug/min | INTRAVENOUS | Status: DC

## 2018-11-18 MED ORDER — SODIUM CHLORIDE 0.9 % IV SOLN: INTRAVENOUS | Status: DC

## 2018-11-18 MED ORDER — PROTAMINE SULFATE 10 MG/ML IV SOLN
INTRAVENOUS | Status: DC | PRN
  Administered 2018-11-18: 170 mg via INTRAVENOUS

## 2018-11-18 MED ORDER — HEPARIN (PORCINE) IN NACL 1000-0.9 UT/500ML-% IV SOLN
INTRAVENOUS | Status: AC
  Filled 2018-11-18: qty 1500

## 2018-11-18 MED ORDER — ASPIRIN EC 81 MG PO TBEC
81.0000 mg | DELAYED_RELEASE_TABLET | Freq: Every day | ORAL | Status: DC
  Administered 2018-11-18 – 2018-11-19 (×2): 81 mg via ORAL
  Filled 2018-11-18 (×2): qty 1

## 2018-11-18 SURGICAL SUPPLY — 30 items
BAG SNAP BAND KOVER 36X36 (MISCELLANEOUS) ×6 IMPLANT
BLANKET WARM UNDERBOD FULL ACC (MISCELLANEOUS) ×2 IMPLANT
CABLE ADAPT PACING TEMP 12FT (ADAPTER) ×2 IMPLANT
CATH 23 EDWARDS DELIVERY SYS (CATHETERS) ×2 IMPLANT
CATH DIAG 6FR PIGTAIL ANGLED (CATHETERS) ×4 IMPLANT
CATH INFINITI 6F AL1 (CATHETERS) ×2 IMPLANT
CATH S G BIP PACING (CATHETERS) ×2 IMPLANT
CLOSURE MYNX CONTROL 6F/7F (Vascular Products) ×2 IMPLANT
CRIMPER (MISCELLANEOUS) ×2 IMPLANT
DEVICE CLOSURE PERCLS PRGLD 6F (VASCULAR PRODUCTS) ×2 IMPLANT
DEVICE INFLATION ATRION QL2530 (MISCELLANEOUS) ×2 IMPLANT
GUIDEWIRE SAFE TJ AMPLATZ EXST (WIRE) ×2 IMPLANT
KIT HEART LEFT (KITS) ×2 IMPLANT
KIT MICROPUNCTURE NIT STIFF (SHEATH) ×2 IMPLANT
PACK CARDIAC CATHETERIZATION (CUSTOM PROCEDURE TRAY) ×2 IMPLANT
PERCLOSE PROGLIDE 6F (VASCULAR PRODUCTS) ×4
SHEATH 14X36 EDWARDS (SHEATH) ×2 IMPLANT
SHEATH BRITE TIP 6FR 35CM (SHEATH) ×2 IMPLANT
SHEATH PINNACLE 6F 10CM (SHEATH) ×2 IMPLANT
SHEATH PINNACLE 8F 10CM (SHEATH) ×2 IMPLANT
SHEATH PROBE COVER 6X72 (BAG) ×2 IMPLANT
STOPCOCK MORSE 400PSI 3WAY (MISCELLANEOUS) ×4 IMPLANT
SYR MEDRAD MARK 7 150ML (SYRINGE) ×4 IMPLANT
TRANSDUCER W/STOPCOCK (MISCELLANEOUS) ×4 IMPLANT
TUBE CONN 8.8X1320 FR HP M-F (CONNECTOR) ×2 IMPLANT
VALVE HEART TRANSCATH SZ3 23MM (Valve) ×2 IMPLANT
WIRE AMPLATZ SS-J .035X180CM (WIRE) ×2 IMPLANT
WIRE EMERALD 3MM-J .035X150CM (WIRE) ×2 IMPLANT
WIRE EMERALD 3MM-J .035X260CM (WIRE) ×2 IMPLANT
WIRE EMERALD ST .035X260CM (WIRE) ×2 IMPLANT

## 2018-11-18 NOTE — Transfer of Care (Addendum)
Immediate Anesthesia Transfer of Care Note  Patient: Taylor Jenkins  Procedure(s) Performed: TRANSCATHETER AORTIC VALVE REPLACEMENT, TRANSFEMORAL (N/A ) TRANSESOPHAGEAL ECHOCARDIOGRAM (TEE) (N/A )  Patient Location: Cath Lab  Anesthesia Type:General  Level of Consciousness: awake, alert  and oriented  Airway & Oxygen Therapy: Patient Spontanous Breathing and Patient connected to nasal cannula oxygen  Post-op Assessment: Report given to RN and Post -op Vital signs reviewed and stable  Post vital signs: Reviewed and stable  Last Vitals:  Vitals Value Taken Time  BP    Temp    Pulse 90 11/18/2018 12:18 PM  Resp 10 11/18/2018 12:18 PM  SpO2 97 % 11/18/2018 12:18 PM  Vitals shown include unvalidated device data.  Last Pain:  Vitals:   11/18/18 0723  TempSrc: Oral         Complications: No apparent anesthesia complications

## 2018-11-18 NOTE — Anesthesia Procedure Notes (Signed)
Arterial Line Insertion Start/End3/17/2020 8:30 AM Performed by: Jed Limerick, CRNA, CRNA  Patient location: Pre-op. Preanesthetic checklist: patient identified, IV checked, site marked, risks and benefits discussed, surgical consent, monitors and equipment checked, pre-op evaluation, timeout performed and anesthesia consent Lidocaine 1% used for infiltration Right, radial was placed Catheter size: 20 G Hand hygiene performed  and maximum sterile barriers used   Attempts: 2 Procedure performed without using ultrasound guided technique. Following insertion, dressing applied and Biopatch. Post procedure assessment: normal and unchanged  Patient tolerated the procedure well with no immediate complications.

## 2018-11-18 NOTE — Progress Notes (Signed)
  [facility]   MULTIDISCIPLINARY HEART VALVE TEAM  Patient doing well s/p TAVR. She is hemodynamically stable. Groin sites stable. ECG with chronic afib and no high grade block. Arterial line discontinued and transfe to 4E. Plan for early ambulation after bedrest completed and hopeful discharge over the next 24-48 hours.   Cline Crock PA-C  [facility]  Pager [phone]

## 2018-11-18 NOTE — Progress Notes (Signed)
Right radial a-line removed. Pressure x 12 min. Site level zero. Dressing applied.

## 2018-11-18 NOTE — Progress Notes (Signed)
Pt received from holding area. Oriented to room and equipment. Pt and daughters at bedside educated regarding bed rest until 4pm and plan of care, verbalized understanding. Bilateral groin sites level 0. VSS. Telemetry applied, CCMD notified.   Leonidas Romberg, RN

## 2018-11-18 NOTE — Op Note (Signed)
[facility]   MULTIDISCIPLINARY HEART VALVE TEAM   TAVR OPERATIVE NOTE   Date of Procedure:  11/18/2018  Preoperative Diagnosis: Severe Aortic Stenosis   Postoperative Diagnosis: Same   Procedure:    Transcatheter Aortic Valve Replacement - Percutaneous Right Transfemoral Approach  Edwards Sapien 3 THV (size 23 mm, model # 9600TFX, serial # [id number])   Co-Surgeons:  Alleen Borne, MD and Verne Carrow, MD   Anesthesiologist:  Sandford Craze, MD  Echocardiographer:  Tobias Alexander, MD  Pre-operative Echo Findings:  Severe aortic stenosis  Normal left ventricular systolic function  Post-operative Echo Findings:  Trivial to mild paravalvular leak  Normal left ventricular systolic function   BRIEF CLINICAL NOTE AND INDICATIONS FOR SURGERY  This 78 year old woman has stage D, critical, symptomatic aortic stenosis with New York Heart Association class III symptoms of exertional fatigue and shortness of breath as well as orthopnea and peripheral edema consistent with chronic diastolic congestive heart failure. I have personally reviewed her 2D echocardiogram, cardiac catheterization, and CTA studies. Her echocardiogram shows a trileaflet aortic valve with severe thickening and calcification of the valve leaflets with severe restriction of mobility. The mean gradient is 58 mmHg consistent with critical aortic stenosis. There is also moderate aortic insufficiency with an AI pressure half-time of 445 ms. Left ventricular systolic function is normal. Cardiac catheterization shows mild to moderate nonobstructive coronary disease. The mean gradient was measured at 43 mmHg with a peak to peak gradient of 65 mmHg and an aortic valve area of 0.64 cm. I agree that aortic valve replacement is indicated in this patient with progressive symptoms that are markedly limiting her quality of life and activity level. She would be a moderate risk patient for open surgical  aortic valve replacement and I think transcatheter aortic valve replacement would be the best option for her at 78 years old. Her gated cardiac CTA shows anatomy suitable for transcatheter aortic valve replacement using a 23 mm Sapien 3 valve.Her BSA is 1.86 m so this valve should be adequate for her. She has been in chronic atrial fibrillation and not on anticoagulation due to history of GI bleeding due to gastric AVMs but there is no evidence of left atrial appendage thrombus on gated cardiac CTA. Her abdominal and pelvic CTA shows adequate pelvic vascular anatomy to allow transfemoral insertion.   The patientandher twodaughters werecounseled at length regarding treatment alternatives for management of severe symptomatic aortic stenosis. The risks and benefits of surgical intervention has been discussed in detail. Long-term prognosis with medical therapy was discussed. Alternative approaches such as conventional surgical aortic valve replacement, transcatheter aortic valve replacement, and palliative medical therapy were compared and contrasted at length. This discussion was placed in the context of the patient's own specific clinical presentation and past medical history. All of their questions havebeen addressed.    Following the decision to proceed with transcatheter aortic valve replacement, a discussion was held regarding what types of management strategies would be attempted intraoperatively in the event of life-threatening complications, including whether or not the patient would be considered a candidate for the use of cardiopulmonary bypass and/or conversion to open sternotomy for attempted surgical intervention. The patient is aware of the fact that transient use of cardiopulmonary bypass may be necessary.I think she would be a candidate for emergent sternotomy if needed to manage any intraoperative complications.  The patient has been advised of a variety of complications that  might develop including but not limited to risks of death, stroke,  paravalvular leak, aortic dissection or other major vascular complications, aortic annulus rupture, device embolization, cardiac rupture or perforation, mitral regurgitation, acute myocardial infarction, arrhythmia, heart block or bradycardia requiring permanent pacemaker placement, congestive heart failure, respiratory failure, renal failure, pneumonia, infection, other late complications related to structural valve deterioration or migration, or other complications that might ultimately cause a temporary or permanent loss of functional independence or other long term morbidity. The patient provides full informed consent for the procedure as described and all questions were answered.    DETAILS OF THE OPERATIVE PROCEDURE  PREPARATION:    The patient is brought to the operating room on the above mentioned date and central monitoring was established by the anesthesia team including placement of a central venous line and radial arterial line. The patient is placed in the supine position on the operating table.  Intravenous antibiotics are administered. General endotracheal anesthesia is induced uneventfully.  Baseline transesophageal echocardiogram was performed. The patient's chest, abdomen, both groins, and both lower extremities are prepared and draped in a sterile manner. A time out procedure is performed.   PERIPHERAL ACCESS:    Using the modified Seldinger technique, femoral arterial and venous access was obtained with placement of 6 Fr sheaths on the left side.  A pigtail diagnostic catheter was passed through the left arterial sheath under fluoroscopic guidance into the aortic root.  A temporary transvenous pacemaker catheter was passed through the left femoral venous sheath under fluoroscopic guidance into the right ventricle.  The pacemaker was tested to ensure stable lead placement and pacemaker capture. Aortic root  angiography was performed in order to determine the optimal angiographic angle for valve deployment.   TRANSFEMORAL ACCESS:   Percutaneous transfemoral access and sheath placement was performed using ultrasound guidance.  The right common femoral artery was cannulated using a micropuncture needle and appropriate location was verified using hand injection angiogram.  A pair of Abbott Perclose percutaneous closure devices were placed and a 6 French sheath replaced into the femoral artery.  The patient was heparinized systemically and ACT verified > 250 seconds.    A 14 Fr transfemoral E-sheath was introduced into the right femoral artery after progressively dilating over an Amplatz superstiff wire. An AL-1 catheter was used to direct a straight-tip exchange length wire across the native aortic valve into the left ventricle. This was exchanged out for a pigtail catheter and position was confirmed in the LV apex. Simultaneous LV and Ao pressures were recorded.  The pigtail catheter was exchanged for an Amplatz Extra-stiff wire in the LV apex.  Echocardiography was utilized to confirm appropriate wire position and no sign of entanglement in the mitral subvalvular apparatus.   BALLOON AORTIC VALVULOPLASTY:   Not performed  TRANSCATHETER HEART VALVE DEPLOYMENT:   An Edwards Sapien 3 transcatheter heart valve (size 23 mm, model #9600TFX, serial #[id number]) was prepared and crimped per manufacturer's guidelines, and the proper orientation of the valve is confirmed on the [employer] delivery system. The valve was advanced through the introducer sheath using normal technique until in an appropriate position in the abdominal aorta beyond the sheath tip. The balloon was then retracted and using the fine-tuning wheel was centered on the valve. The valve was then advanced across the aortic arch using appropriate flexion of the catheter. The valve was carefully positioned across the aortic valve annulus. The  Commander catheter was retracted using normal technique. Once final position of the valve has been confirmed by angiographic assessment, the valve is deployed while temporarily  holding ventilation and during rapid ventricular pacing to maintain systolic blood pressure < 50 mmHg and pulse pressure < 10 mmHg. The balloon inflation is held for >3 seconds after reaching full deployment volume. Once the balloon has fully deflated the balloon is retracted into the ascending aorta and valve function is assessed using echocardiography. There is felt to be trivial to mild paravalvular leak and no central aortic insufficiency.  The patient's hemodynamic recovery following valve deployment is good.  The deployment balloon and guidewire are both removed.    PROCEDURE COMPLETION:   The sheath was removed and femoral artery closure performed using the previously placed Perclose devices.  Protamine was administered once femoral arterial repair was complete. The temporary pacemaker, pigtail catheters and femoral sheaths were removed with manual pressure used for hemostasis for the left femoral venous sheath and a Mynx closure device for the left femoral artery sheath.  The patient tolerated the procedure well and is transported to the surgical intensive care in stable condition. There were no immediate intraoperative complications. All sponge instrument and needle counts are verified correct at completion of the operation.   No blood products were administered during the operation.  The patient received a total of 40 mL of intravenous contrast during the procedure.   Alleen Borne, MD 11/18/2018

## 2018-11-18 NOTE — Interval H&P Note (Signed)
History and Physical Interval Note:  11/18/2018 10:26 AM  Taylor Jenkins  has presented today for surgery, with the diagnosis of Severe Aortic Stenosis.  The various methods of treatment have been discussed with the patient and family. After consideration of risks, benefits and other options for treatment, the patient has consented to  Procedure(s): TRANSCATHETER AORTIC VALVE REPLACEMENT, TRANSFEMORAL (N/A) TRANSESOPHAGEAL ECHOCARDIOGRAM (TEE) (N/A) as a surgical intervention.  The patient's history has been reviewed, patient examined, no change in status, stable for surgery.  I have reviewed the patient's chart and labs.  Questions were answered to the patient's satisfaction.     Alleen Borne

## 2018-11-18 NOTE — Anesthesia Procedure Notes (Signed)
Procedure Name: Intubation Date/Time: 11/18/2018 10:18 AM Performed by: Jed Limerick, CRNA Pre-anesthesia Checklist: Patient identified, Emergency Drugs available, Suction available and Patient being monitored Patient Re-evaluated:Patient Re-evaluated prior to induction Oxygen Delivery Method: Circle System Utilized Preoxygenation: Pre-oxygenation with 100% oxygen Induction Type: IV induction Ventilation: Mask ventilation without difficulty and Oral airway inserted - appropriate to patient size Laryngoscope Size: Glidescope and 3 (glidescope go) Grade View: Grade I Tube type: Oral Tube size: 7.0 mm Number of attempts: 1 Airway Equipment and Method: Oral airway and Video-laryngoscopy Placement Confirmation: ETT inserted through vocal cords under direct vision,  positive ETCO2 and breath sounds checked- equal and bilateral Secured at: 22 cm Tube secured with: Tape Dental Injury: Teeth and Oropharynx as per pre-operative assessment

## 2018-11-18 NOTE — CV Procedure (Signed)
[facility]  TAVR OPERATIVE NOTE   Date of Procedure:  11/18/2018  Preoperative Diagnosis: Severe Aortic Stenosis   Postoperative Diagnosis: Same   Procedure:    Transcatheter Aortic Valve Replacement - Transfemoral Approach  Edwards Sapien 3 THV (size 23 mm, model # [id number], serial # [id number])   Co-Surgeons:  Lauree Chandler, MD and Gaye Pollack, MD   Anesthesiologist:  Glennon Mac  Echocardiographer:             Meda Coffee  Pre-operative Echo Findings:  Severe aortic stenosis  Normal left ventricular systolic function  Post-operative Echo Findings:  Trivial to mild paravalvular leak  Normal left ventricular systolic function  BRIEF CLINICAL NOTE AND INDICATIONS FOR SURGERY  78 yo female with history of type 2 diabetes, hyperlipidemia, hypertension, diastolic congestive heart failure, coronary disease, atrial fibrillation, and severe aortic stenosis who is here today for TAVR. She was followed for the last several years in [facility].  She eventually self referred to Dr. Debara Pickett who she saw last month.  A follow-up echocardiogram on 10/13/2018 showed severe aortic stenosis with a mean gradient of 58 mmHg and aortic valve area of 0.75 cm.  There is moderate aortic insufficiency.  Left ventricular ejection fraction is 60-65%.  There is moderate left ventricular hypertrophy.  She underwent cardiac catheterization on 10/30/2018 which showed mild to moderate nonobstructive coronary disease with about 30-40% mid LAD stenosis which was unchanged from prior catheterization in 2016.  There is also about 40% ostial proximal left circumflex stenosis.  The mean gradient across aortic valve was 43 mmHg with a peak to peak gradient of 65 mmHg and a valve area of 0.64 cm.  PA pressure was 37/19 with a mean of 23.  During the course of the patient's preoperative work up they have been evaluated comprehensively by a multidisciplinary team of specialists coordinated through the  [facility] in the [facility].  They have been demonstrated to suffer from symptomatic severe aortic stenosis as noted above. The patient has been counseled extensively as to the relative risks and benefits of all options for the treatment of severe aortic stenosis including long term medical therapy, conventional surgery for aortic valve replacement, and transcatheter aortic valve replacement.  The patient has been independently evaluated by Dr. Cyndia Bent with CT surgery and they are felt to be at high risk for conventional surgical aortic valve replacement. The surgeon indicated the patient would be a poor candidate for conventional surgery. Based upon review of all of the patient's preoperative diagnostic tests they are felt to be candidate for transcatheter aortic valve replacement using the transfemoral approach as an alternative to high risk conventional surgery.    Following the decision to proceed with transcatheter aortic valve replacement, a discussion has been held regarding what types of management strategies would be attempted intraoperatively in the event of life-threatening complications, including whether or not the patient would be considered a candidate for the use of cardiopulmonary bypass and/or conversion to open sternotomy for attempted surgical intervention.  The patient has been advised of a variety of complications that might develop peculiar to this approach including but not limited to risks of death, stroke, paravalvular leak, aortic dissection or other major vascular complications, aortic annulus rupture, device embolization, cardiac rupture or perforation, acute myocardial infarction, arrhythmia, heart block or bradycardia requiring permanent pacemaker placement, congestive heart failure, respiratory failure, renal failure, pneumonia, infection, other late complications related to structural valve deterioration or migration, or  other  complications that might ultimately cause a temporary or permanent loss of functional independence or other long term morbidity.  The patient provides full informed consent for the procedure as described and all questions were answered preoperatively.    DETAILS OF THE OPERATIVE PROCEDURE  PREPARATION:   The patient is brought to the operating room on the above mentioned date and central monitoring was established by the anesthesia team including placement of a radial arterial line. The patient is placed in the supine position on the operating table.  Intravenous antibiotics are administered. She is intubated and general anesthesia is used.    Baseline transesophageal echocardiogram was performed. The patient's chest, abdomen, both groins, and both lower extremities are prepared and draped in a sterile manner. A time out procedure is performed.   PERIPHERAL ACCESS:   Using the modified Seldinger technique, femoral arterial and venous access were obtained with placement of 6 Fr sheaths on the left side using u/s guidance.  A pigtail diagnostic catheter was passed through the femoral arterial sheath under fluoroscopic guidance into the aortic root.  A temporary transvenous pacemaker catheter was passed through the femoral venous sheath under fluoroscopic guidance into the right ventricle.  The pacemaker was tested to ensure stable lead placement and pacemaker capture. Aortic root angiography was performed in order to determine the optimal angiographic angle for valve deployment.  TRANSFEMORAL ACCESS:  A micropuncture kit was used to gain access to the right femoral artery using u/s guidance. Position confirmed with angiography. Pre-closure with double ProGlide closure devices. The patient was heparinized systemically and ACT verified > 250 seconds.    A 14 Fr transfemoral E-sheath was introduced into the right femoral artery after progressively dilating over an Amplatz superstiff wire. An AL-1  catheter was used to direct a straight-tip exchange length wire across the native aortic valve into the left ventricle. This was exchanged out for a pigtail catheter and position was confirmed in the LV apex. Simultaneous LV and Ao pressures were recorded.  The pigtail catheter was then exchanged for an Amplatz Extra-stiff wire in the LV apex.   TRANSCATHETER HEART VALVE DEPLOYMENT:  An Edwards Sapien 3 THV (size 23 mm) was prepared and crimped per manufacturer's guidelines, and the proper orientation of the valve is confirmed on the [employer] delivery system. The valve was advanced through the introducer sheath using normal technique until in an appropriate position in the abdominal aorta beyond the sheath tip. The balloon was then retracted and using the fine-tuning wheel was centered on the valve. The valve was then advanced across the aortic arch using appropriate flexion of the catheter. The valve was carefully positioned across the aortic valve annulus. The Commander catheter was retracted using normal technique. Once final position of the valve has been confirmed by angiographic assessment, the valve is deployed while temporarily holding ventilation and during rapid ventricular pacing to maintain systolic blood pressure < 50 mmHg and pulse pressure < 10 mmHg. The balloon inflation is held for >3 seconds after reaching full deployment volume. Once the balloon has fully deflated the balloon is retracted into the ascending aorta and valve function is assessed using TTE. There is felt to be no paravalvular leak and no central aortic insufficiency.  The patient's hemodynamic recovery following valve deployment is good.  The deployment balloon and guidewire are both removed. Echo demostrated acceptable post-procedural gradients, stable mitral valve function, and no AI.   PROCEDURE COMPLETION:  The sheath was then removed and closure devices were  completed. Protamine was administered once femoral  arterial repair was complete. The temporary pacemaker, pigtail catheters and femoral sheaths were removed with Mynx closure devices placed in the artery manual pressure used for hemostasis.   The patient tolerated the procedure well and is transported to the surgical intensive care in stable condition. There were no immediate intraoperative complications. All sponge instrument and needle counts are verified correct at completion of the operation.   No blood products were administered during the operation.  The patient received a total of 40 mL of intravenous contrast during the procedure.  Lauree Chandler MD 11/18/2018 10:16 AM

## 2018-11-18 NOTE — Anesthesia Postprocedure Evaluation (Signed)
Anesthesia Post Note  Patient: Taylor Jenkins  Procedure(s) Performed: TRANSCATHETER AORTIC VALVE REPLACEMENT, TRANSFEMORAL (N/A ) TRANSESOPHAGEAL ECHOCARDIOGRAM (TEE) (N/A )     Patient location during evaluation: Cath Lab Anesthesia Type: General Level of consciousness: awake and alert, oriented and patient cooperative Pain management: pain level controlled Vital Signs Assessment: post-procedure vital signs reviewed and stable Respiratory status: spontaneous breathing, nonlabored ventilation, respiratory function stable and patient connected to nasal cannula oxygen Cardiovascular status: blood pressure returned to baseline and stable Postop Assessment: no apparent nausea or vomiting Anesthetic complications: no    Last Vitals:  Vitals:   11/18/18 1424 11/18/18 1530  BP: 123/68 131/74  Pulse: 88 100  Resp: 20 18  Temp:    SpO2: 90% 100%    Last Pain:  Vitals:   11/18/18 1500  TempSrc:   PainSc: 0-No pain                 Marayah Higdon,E. Kiannah Grunow

## 2018-11-19 ENCOUNTER — Encounter (HOSPITAL_COMMUNITY): Payer: Self-pay | Admitting: Cardiovascular Disease

## 2018-11-19 ENCOUNTER — Other Ambulatory Visit: Payer: Self-pay | Admitting: Physician Assistant

## 2018-11-19 ENCOUNTER — Inpatient Hospital Stay (HOSPITAL_COMMUNITY): Payer: Medicare HMO

## 2018-11-19 DIAGNOSIS — I361 Nonrheumatic tricuspid (valve) insufficiency: Secondary | ICD-10-CM

## 2018-11-19 DIAGNOSIS — I350 Nonrheumatic aortic (valve) stenosis: Principal | ICD-10-CM

## 2018-11-19 DIAGNOSIS — Z952 Presence of prosthetic heart valve: Secondary | ICD-10-CM

## 2018-11-19 DIAGNOSIS — I5033 Acute on chronic diastolic (congestive) heart failure: Secondary | ICD-10-CM

## 2018-11-19 DIAGNOSIS — I351 Nonrheumatic aortic (valve) insufficiency: Secondary | ICD-10-CM

## 2018-11-19 DIAGNOSIS — I4819 Other persistent atrial fibrillation: Secondary | ICD-10-CM

## 2018-11-19 LAB — ECHOCARDIOGRAM COMPLETE
Height: 57 in
Weight: 3408 oz

## 2018-11-19 LAB — CBC
HCT: 31.9 % — ABNORMAL LOW (ref 36.0–46.0)
Hemoglobin: 9.2 g/dL — ABNORMAL LOW (ref 12.0–15.0)
MCH: 28.8 pg (ref 26.0–34.0)
MCHC: 28.8 g/dL — ABNORMAL LOW (ref 30.0–36.0)
MCV: 99.7 fL (ref 80.0–100.0)
Platelets: 183 10*3/uL (ref 150–400)
RBC: 3.2 MIL/uL — ABNORMAL LOW (ref 3.87–5.11)
RDW: 16.9 % — ABNORMAL HIGH (ref 11.5–15.5)
WBC: 10.4 10*3/uL (ref 4.0–10.5)
nRBC: 0 % (ref 0.0–0.2)

## 2018-11-19 LAB — POCT ACTIVATED CLOTTING TIME
Activated Clotting Time: 131 seconds
Activated Clotting Time: 208 seconds

## 2018-11-19 LAB — BASIC METABOLIC PANEL
Anion gap: 6 (ref 5–15)
BUN: 19 mg/dL (ref 8–23)
CO2: 24 mmol/L (ref 22–32)
Calcium: 8.8 mg/dL — ABNORMAL LOW (ref 8.9–10.3)
Chloride: 108 mmol/L (ref 98–111)
Creatinine, Ser: 0.81 mg/dL (ref 0.44–1.00)
GFR calc Af Amer: >60 mL/min (ref >60)
GFR calc non Af Amer: >60 mL/min (ref >60)
Glucose, Bld: 208 mg/dL — ABNORMAL HIGH (ref 70–99)
Potassium: 4.7 mmol/L (ref 3.5–5.1)
Sodium: 138 mmol/L (ref 135–145)

## 2018-11-19 LAB — GLUCOSE, CAPILLARY
Glucose-Capillary: 144 mg/dL — ABNORMAL HIGH (ref 70–99)
Glucose-Capillary: 186 mg/dL — ABNORMAL HIGH (ref 70–99)

## 2018-11-19 LAB — MAGNESIUM: Magnesium: 2.1 mg/dL (ref 1.7–2.4)

## 2018-11-19 MED ORDER — APIXABAN 5 MG PO TABS: 5.0000 mg | ORAL_TABLET | Freq: Two times a day (BID) | ORAL | 3 refills | Status: DC

## 2018-11-19 MED ORDER — FUROSEMIDE 10 MG/ML IJ SOLN
40.0000 mg | Freq: Once | INTRAMUSCULAR | Status: AC
  Administered 2018-11-19: 40 mg via INTRAVENOUS
  Filled 2018-11-19: qty 4

## 2018-11-19 MED ORDER — TORSEMIDE 10 MG PO TABS: 10.0000 mg | ORAL_TABLET | Freq: Two times a day (BID) | ORAL | 3 refills | Status: DC

## 2018-11-19 MED ORDER — METOPROLOL TARTRATE 50 MG PO TABS
50.0000 mg | ORAL_TABLET | Freq: Two times a day (BID) | ORAL | Status: DC
  Administered 2018-11-19: 50 mg via ORAL
  Filled 2018-11-19: qty 1

## 2018-11-19 MED ORDER — ALLOPURINOL 100 MG PO TABS: 100.0000 mg | ORAL_TABLET | Freq: Every day | ORAL | 1 refills | Status: DC

## 2018-11-19 MED FILL — Lidocaine HCl Local Preservative Free (PF) Inj 1%: INTRAMUSCULAR | Qty: 30 | Status: AC

## 2018-11-19 MED FILL — Vancomycin HCl For IV Soln 10 GM (Base Equivalent): INTRAVENOUS | Qty: 1250 | Status: AC

## 2018-11-19 MED FILL — TORSEMIDE 20 MG TABLET: 20 | 30 days supply | Qty: 30 | Fill #0 | Status: TO

## 2018-11-19 MED FILL — ELIQUIS 5 MG TABLET: 5 | 30 days supply | Qty: 60 | Fill #0 | Status: TO

## 2018-11-19 NOTE — Progress Notes (Addendum)
[facility]ENTER   MULTIDISCIPLINARY HEART VALVE TEAM  Patient Name: Taylor Jenkins Date of Encounter: 11/19/2018  Primary Cardiologist: Dr. Rennis Golden / Dr. Clifton James and [facility] (TAVR)  Hospital Problem List     Principal Problem:   S/P TAVR (transcatheter aortic valve replacement) Active Problems:   Acute on chronic diastolic (congestive) heart failure (HCC)   IDDM (insulin dependent diabetes mellitus) (HCC)   HTN (hypertension)   HLD (hyperlipidemia)   Obesity (BMI 30-39.9)   Severe aortic stenosis   Atrial fibrillation, persistent    Subjective  Patient ambulating in the room at time of assessment. Patient used the restroom, brushed teeth and ambulated independently back to chair. Patient HR in the 140's (A.Fib) with moderate shortness of breath. O2 Sats 96% but patient felt SHOB for approximately 5 minutes after sitting down.   Inpatient Medications    Scheduled Meds: . allopurinol  100 mg Oral Daily  . aspirin EC  81 mg Oral Daily  . Chlorhexidine Gluconate Cloth  6 each Topical Q0600  . clopidogrel  75 mg Oral Q breakfast  . ferrous sulfate  325 mg Oral Daily  . insulin aspart  0-24 Units Subcutaneous TID AC & HS  . mupirocin ointment  1 application Nasal BID  . sodium chloride flush  3 mL Intravenous Q12H   Continuous Infusions: . sodium chloride    . levofloxacin (LEVAQUIN) IV    . nitroGLYCERIN    . phenylephrine (NEO-SYNEPHRINE) Adult infusion     PRN Meds: sodium chloride, acetaminophen **OR** acetaminophen, ondansetron (ZOFRAN) IV, sodium chloride flush   Vital Signs    Vitals:   11/18/18 2345 11/19/18 0356 11/19/18 0645 11/19/18 0750  BP: 135/65 128/71    Pulse: (!) 106 82 78   Resp: (!) 26 17 20    Temp: 98 F (36.7 C) 97.8 F (36.6 C)  (!) 97.5 F (36.4 C)  TempSrc: Oral Oral  Oral  SpO2: 97% 100% 94%   Weight:  96.6 kg    Height:        Intake/Output Summary (Last 24 hours) at 11/19/2018 0910 Last data filed at 11/19/2018 0700 Gross per  24 hour  Intake 1430 ml  Output 750 ml  Net 680 ml   Filed Weights   11/18/18 1424 11/19/18 0356  Weight: 93.6 kg 96.6 kg    Physical Exam    GEN: Well nourished, well developed, in no acute distress.  HEENT: Grossly normal.  Neck: Supple, no JVD, carotid bruits, or masses. Cardiac: Atrial fibrillation. S1, S2, soft murmur ausculated at the RUSB. +1 bilateral lower extremity. Radial and DP pulses +2 and equal bilaterally.  Respiratory:  Dyspnea with exertion, diminished breath sounds bilaterally. Marland Kitchen GI: Soft, nontender, nondistended, BS + x 4. MS: no deformity or atrophy. Skin: warm and dry, no rash. Right groin site clean, dry, intact without ecchymosis. Left groin site with Minx closure - clean, dry and intact without ecchymosis.  Neuro:  Strength and sensation are intact. Psych: AAOx3.  Normal affect.  Labs    CBC Recent Labs    11/17/18 0918  11/18/18 1225 11/19/18 0236  WBC 8.7  --   --  10.4  HGB 10.3*   < > 9.5* 9.2*  HCT 34.0*   < > 28.0* 31.9*  MCV 97.7  --   --  99.7  PLT 217  --   --  183   < > = values in this interval not displayed.   Basic Metabolic Panel Recent Labs  11/17/18 0918 11/18/18 1022 11/18/18 1225 11/18/18 1231 11/19/18 0236  NA 138 144 143  --  138  K 4.2 3.8 4.1  --  4.7  CL 108  --   --   --  108  CO2 20*  --   --   --  24  GLUCOSE 150* 136*  --   --  208*  BUN 27*  --   --   --  19  CREATININE 0.84  --   --  0.80 0.81  CALCIUM 9.0  --   --   --  8.8*  MG  --   --   --   --  2.1   Liver Function Tests Recent Labs    11/17/18 0918  AST 41  ALT 34  ALKPHOS 98  BILITOT 0.4  PROT 7.1  ALBUMIN 3.7   No results for input(s): LIPASE, AMYLASE in the last 72 hours. Cardiac Enzymes No results for input(s): CKTOTAL, CKMB, CKMBINDEX, TROPONINI in the last 72 hours. BNP Invalid input(s): POCBNP D-Dimer No results for input(s): DDIMER in the last 72 hours. Hemoglobin A1C Recent Labs    11/17/18 0915  HGBA1C 6.2*   Fasting  Lipid Panel No results for input(s): CHOL, HDL, LDLCALC, TRIG, CHOLHDL, LDLDIRECT in the last 72 hours. Thyroid Function Tests No results for input(s): TSH, T4TOTAL, T3FREE, THYROIDAB in the last 72 hours.  Invalid input(s): FREET3  Telemetry    Atrial fibrillation with controlled ventricular response with episodes of RVR with HRs in 150s - Personally Reviewed  ECG    Atrial fibrillation - Personally Reviewed  Radiology    Dg Chest 2 View  Result Date: 11/17/2018 CLINICAL DATA:  Severe aortic stenosis, history CHF, coronary artery disease, type II diabetes mellitus, hypertension, hyperlipidemia, atrial fibrillation EXAM: CHEST - 2 VIEW COMPARISON:  04/25/2017 Correlation: CT chest 11/05/2018 FINDINGS: Enlargement of cardiac silhouette with pulmonary vascular congestion. Mild tortuosity of thoracic aorta. Interstitial infiltrates favoring pulmonary edema. No pleural effusion or pneumothorax. Bones demineralized. IMPRESSION: Enlargement of cardiac silhouette with pulmonary vascular congestion and probable mild pulmonary edema. Electronically Signed   By: Ulyses Southward M.D.   On: 11/17/2018 14:32   Dg Chest Port 1 View  Result Date: 11/18/2018 CLINICAL DATA:  Status post transcatheter aortic valve replacement. EXAM: PORTABLE CHEST 1 VIEW COMPARISON:  11/17/2018. FINDINGS: Stable enlarged cardiac silhouette and prominent pulmonary vasculature. Mild increase in prominence of the interstitial markings with Kerley lines. Probable minimal bilateral pleural effusions. Transcatheter aortic valve in satisfactory position in the frontal projection. Unremarkable bones. IMPRESSION: Cardiomegaly and mild changes of congestive heart failure with mild progression. Electronically Signed   By: Beckie Salts M.D.   On: 11/18/2018 15:51    Cardiac Studies   TAVR OPERATIVE NOTE   Date of Procedure:                11/18/2018  Preoperative Diagnosis:      Severe Aortic Stenosis   Postoperative Diagnosis:     Same   Procedure:        Transcatheter Aortic Valve Replacement - Percutaneous Right Transfemoral Approach             Edwards Sapien 3 THV (size 23 mm, model # [id number], serial # [id number])              Co-Surgeons:                        Alleen Borne, MD  and Verne Carrow, MD   Anesthesiologist:                  Sandford Craze, MD  Echocardiographer:              Tobias Alexander, MD  Pre-operative Echo Findings: ? Severe aortic stenosis ? Normal left ventricular systolic function  Post-operative Echo Findings: ? Trivial to mild paravalvular leak ? Normal left ventricular systolic function   Patient Profile     78 year old female with a past medical history of diastolic CHF, DM2, HLD, HTN, chronic atrial fibrillation, iron deficiency anemia and small bowel AVM on iron infusions. Severe Aortic stenosis, status post TAVR.  Assessment & Plan   Severe Aortic Stenosis - status post TAVR with an Edwards Sapaien 3 (53mm). Transfemoral approach. Plan: Echo this morning Started on Eliquis given chronic atrial fibrillation and high CHADSVASC score of 6.  Continue aspirin 6 months Plan for discharge this afternoon after echo  Acute on chronic diastolic heart failure - Patient with dyspnea on exertion this morning. Yesterday's CXR showed mild changes of congestive heart failure with minimal bilateral pleural effusions. Plan: Will give 40 mg IV lasix once Restart home dose of torsemide tonight  Atrial fibrillation - chronic. Patient not any any anticoagulants due to history of GI bleed from AVM. Will now trial eliquis as we hope that AVMS will improve with treatment of her severe AS. Plan: Restart home dose of metoprolol - 50 mg BID   HTN - 128/71 this morning Plan:  Restart home dose of Cozaar, 25 mg daily  Iron deficiency anemia:  Hgb 9.2 this morning.  Plan: Ferrous sulfate 325 mg daily  Restart Prenatal Vit-Fe Fumarate-Fa (prenatal pro) at discharge  IDDM  (insulin dependent diabetes mellitus): Well controlled with Hgb A1C at 6.2 on pre-procedure labs Plan: Continue Humulin BID Restart metformin 11/20/2018 PM   CAD: Patient with recent Right and Left heart cath with mild-moderate non-obstructive CAD.  Plan: continue medical therapy and primary prevention measures. Intolerant to statins   Obesity (BMI 30-39.9) Plan: Encourage heart healthy, low sodium, carb modified diet  Activity as tolerated  Signed, Mickie Kay, Student NP 11/19/2018, 9:10 AM  Pager [phone]   Note written by Alwyn Ren NP student. I have reviewed and agreed with assessment and plan.   I have personally seen and examined this patient. I agree with the assessment and plan as outlined above.  She is doing well today post TAVR. BP stable. Mild volume overload. Persistent atrial fib. Resume beta blocker. Echo today and if stable, will d/c home today. Will continue ASA and will start Eliquis. Long discussion with pt and family regarding long term AC with Eliquis given previous GI bleeding but high stroke risk with persistent AF.   Verne Carrow 11/19/2018 11:35 AM

## 2018-11-19 NOTE — TOC Benefit Eligibility Note (Signed)
Transition of Care [facility]) Benefit Eligibility Note    Patient Details  Name: Taylor Jenkins MRN: [id number] Date of Birth: [birth year]   Medication/Dose:  Eliquis 5 mg BID   Covered?: Yes        Spoke with Person/Company/Phone Number:: [employer]  Co-Pay: 251.44  Prior Approval: No  Deductible: Unmet  Additional Notes: Co pay high due to unmet Deductible once 204.0 met copay is 47.00  No information available for 76m  IOrbie PyoPhone Number: 11/19/2018, 9:45 AM

## 2018-11-19 NOTE — Progress Notes (Signed)
Pt ambulated 250 feet at a quick pace with walker. Pt HR reached 150's. Afib.  Upon returning to room pt HR Afib@97 . Pt did become winded during walk. Sat did not pick up. Probe replaced. Taylor Jenkins

## 2018-11-19 NOTE — Progress Notes (Signed)
Pt just walked with RN with RW. Encouraged her to walk without RW later (I can come back if needed). Discussed ex gl, restrictions, and CRPII. Will refer to [facility]. Gave pt diet sheets for reminders.  [id number] Ethelda Chick CES, ACSM 10:30 AM 11/19/2018

## 2018-11-19 NOTE — TOC Transition Note (Signed)
Transition of Care [facility]) - CM/SW Discharge Note   Patient Details  Name: Taylor Jenkins MRN: [id number] Date of Birth: [birth year]  [facility] CM/SW Contact:  Carles Collet, RN Phone Number: 11/19/2018, 1:39 PM   Clinical Narrative:     Damaris Schooner w patient and daughter at bedside. Daughter is a Marine scientist on this unit. Reviewed Eliquis copay and that it is influenced by her not yet meeting her deductible. Discussed how it will be lower once it's met. Patient and family relieved. Discussed patient assistance program and number/ website to use to get more information. Discussed TOC pharmacy. Referral made and accepted, plan for DC meds to be delivered to room prior to DC.  No other CM needs.   Final next level of care: Home/Self Care Barriers to Discharge: No Barriers Identified   Patient Goals and CMS Choice        Discharge Placement                       Discharge Plan and Services                        Social Determinants of Health (SDOH) Interventions     Readmission Risk Interventions No flowsheet data found.

## 2018-11-19 NOTE — Progress Notes (Signed)
Patient given discharge instructions, medication list and prescriptions given to patient from [facility] pharmacy. Patient verbalized understanding of follow up appointments. IV and tele were dcd. Will discharge home as ordered. Wrigley Winborne, Randall An rN

## 2018-11-19 NOTE — Discharge Summary (Addendum)
[facility]   MULTIDISCIPLINARY HEART VALVE TEAM  Discharge Summary    Patient ID: Taylor Jenkins MRN: [id number]; DOB: [birth year]  Admit date: 11/18/2018 Discharge date: 11/19/2018  Primary Care Provider: Tarri Fuller, MD  Primary Cardiologist: Dr. Rennis Golden / Dr. Clifton James and Laneta Simmers (TAVR)  Discharge Diagnoses    Principal Problem:   S/P TAVR (transcatheter aortic valve replacement) Active Problems:   Acute on chronic diastolic (congestive) heart failure (HCC)   IDDM (insulin dependent diabetes mellitus) (HCC)   HTN (hypertension)   HLD (hyperlipidemia)   Obesity (BMI 30-39.9)   Severe aortic stenosis   Atrial fibrillation, persistent   Allergies Allergies  Allergen Reactions   Codeine Shortness Of Breath and Itching   Epinephrine Shortness Of Breath and Palpitations   Penicillins Anaphylaxis    Did it involve swelling of the face/tongue/throat, SOB, or low BP? Yes Did it involve sudden or severe rash/hives, skin peeling, or any reaction on the inside of your mouth or nose? No Did you need to seek medical attention at a hospital or doctor's office? Yes When did it last happen?40+ years If all above answers are NO, may proceed with cephalosporin use.    Prednisone Other (See Comments)    psychosis    Statins Other (See Comments)    Myalgias    Lidocaine Palpitations   Sulfa Antibiotics Rash    Diagnostic Studies/Procedures    TAVR OPERATIVE NOTE   Date of Procedure:11/18/2018  Preoperative Diagnosis:Severe Aortic Stenosis   Postoperative Diagnosis:Same   Procedure:   Transcatheter Aortic Valve Replacement - PercutaneousRightTransfemoral Approach Edwards Sapien 3 THV (size72mm, model # 9600TFX, serial # [id number])  Co-Surgeons:Bryan Jennefer Bravo, MD and Verne Carrow, MD   Anesthesiologist:C. Jean Rosenthal,  MD  Delano Metz, MD  Pre-operative Echo Findings: ? Severe aortic stenosis ? Normalleft ventricular systolic function  Post-operative Echo Findings: ? Trivial to mildparavalvular leak ? Normalleft ventricular systolic function  _____________   Echo 11/19/18: completed but pending formal read at discharge  History of Present Illness     Taylor Jenkins is a 78 y.o. female with a history of chronic diastolic CHF, DMT2, HTN, HLD, chronic atrial fib (not on OAC given hx of AVMs with GI bleeding), iron deficiency anemia on iron infusions, and severe AS who presented to [facility] on 11/18/18 for planned TAVR.    Her aortic stenosis had been followed for several years in [facility] by Dr. Heron Nay.  She said that she was having symptoms but nothing was being done.   she was evaluated in [place] and was seen by Dr. Mayford Knife.  She was admitted to [facility] in August 2018 with marked anemia with a hemoglobin of 6.6, heme positive stools, and congestive heart failure.  She did have an echocardiogram done here in August 2018 which showed severe aortic stenosis with a mean gradient of 49 mmHg and a peak of 77 mmHg.  Aortic valve area at that time was 0.65 cm.  Left ventricular ejection fraction was normal.  She was transfused and felt better.  GI was consulted and endoscopic evaluation was recommended.  She subsequently underwent outpatient upper endoscopy by Dr. Loreta Ave which showed AVMs which were apparently clipped.  She then went back for cardiology follow-up in [facility] and continued to have fatigue and exertional shortness of breath.  She eventually self referred to Dr. Rennis Golden who she saw last month.  A follow-up echocardiogram on 10/13/2018 showed severe aortic stenosis with a mean gradient of 58 mmHg and aortic valve  area of 0.75 cm.  There is moderate aortic insufficiency.  Left ventricular ejection fraction is 60-65%.  There is moderate left ventricular hypertrophy.   She underwent cardiac catheterization on 10/30/2018 which showed mild to moderate nonobstructive coronary disease with about 30-40% mid LAD stenosis which was unchanged from prior catheterization in 2016.  There is also about 40% ostial proximal left circumflex stenosis.  The mean gradient across aortic valve was 43 mmHg with a peak to peak gradient of 65 mmHg and a valve area of 0.64 cm.    The patient has been evaluated by the multidisciplinary valve team and felt to have severe, symptomatic aortic stenosis and to be a suitable candidate for TAVR, which was set up for 11/18/18.    Hospital Course     Consultants: none  Severe AS: s/p successful TAVR with a 23 mm Edwards Sapien 3 THV via the TF approach on 11/18/18. Post operative echo completed but pending formal read at the time of DC. Groin sites are stable. ECG with chronic afib and no high grade heart block. Started on Asprin and Eliquis given chronic atrial fibrillation and high CHADSVASC score of 6.   Acute on chronic diastolic heart failure: as evidenced by elevated BNP on pre admission labwork, CXR with CHF and s/s CHF on exam. She was given one dose of IV lasix 40mg . She will be resumed on her torsemide 10mg  BID tonight.  Chronic atrial fibrillation: patient not any any anticoagulants due to history of GI bleed from AVMs. These were reportedly clipped by Dr. Loreta Ave. Will now trial eliquis as we hope that AVMs will improve with treatment of her severe AS. She has been resumed on her home dose of metoprolol 50 mg BID   HTN: BP well controlled. Resume home meds  Iron deficiency anemia:  Hgb 9.2 this morning. Continue home iron   IDDM (insulin dependent diabetes mellitus): Well controlled with Hgb A1C at 6.2. Restart metformin 11/20/2018 PM as well as home insulin regimen   _____________  Discharge Vitals Blood pressure 128/71, pulse 84, temperature 98.2 F (36.8 C), temperature source Oral, resp. rate (!) 22, height 4\' 9"  (1.448 m),  weight 96.6 kg, SpO2 94 %.  Filed Weights   11/18/18 1424 11/19/18 0356  Weight: 93.6 kg 96.6 kg    Labs & Radiologic Studies    CBC Recent Labs    11/17/18 0918  11/18/18 1225 11/19/18 0236  WBC 8.7  --   --  10.4  HGB 10.3*   < > 9.5* 9.2*  HCT 34.0*   < > 28.0* 31.9*  MCV 97.7  --   --  99.7  PLT 217  --   --  183   < > = values in this interval not displayed.   Basic Metabolic Panel Recent Labs    07/20/34 0918 11/18/18 1022 11/18/18 1225 11/18/18 1231 11/19/18 0236  NA 138 144 143  --  138  K 4.2 3.8 4.1  --  4.7  CL 108  --   --   --  108  CO2 20*  --   --   --  24  GLUCOSE 150* 136*  --   --  208*  BUN 27*  --   --   --  19  CREATININE 0.84  --   --  0.80 0.81  CALCIUM 9.0  --   --   --  8.8*  MG  --   --   --   --  2.1   Liver Function Tests Recent Labs    11/17/18 0918  AST 41  ALT 34  ALKPHOS 98  BILITOT 0.4  PROT 7.1  ALBUMIN 3.7   No results for input(s): LIPASE, AMYLASE in the last 72 hours. Cardiac Enzymes No results for input(s): CKTOTAL, CKMB, CKMBINDEX, TROPONINI in the last 72 hours. BNP Invalid input(s): POCBNP D-Dimer No results for input(s): DDIMER in the last 72 hours. Hemoglobin A1C Recent Labs    11/17/18 0915  HGBA1C 6.2*   Fasting Lipid Panel No results for input(s): CHOL, HDL, LDLCALC, TRIG, CHOLHDL, LDLDIRECT in the last 72 hours. Thyroid Function Tests No results for input(s): TSH, T4TOTAL, T3FREE, THYROIDAB in the last 72 hours.  Invalid input(s): FREET3 _____________  Dg Chest 2 View  Result Date: 11/17/2018 CLINICAL DATA:  Severe aortic stenosis, history CHF, coronary artery disease, type II diabetes mellitus, hypertension, hyperlipidemia, atrial fibrillation EXAM: CHEST - 2 VIEW COMPARISON:  04/25/2017 Correlation: CT chest 11/05/2018 FINDINGS: Enlargement of cardiac silhouette with pulmonary vascular congestion. Mild tortuosity of thoracic aorta. Interstitial infiltrates favoring pulmonary edema. No pleural  effusion or pneumothorax. Bones demineralized. IMPRESSION: Enlargement of cardiac silhouette with pulmonary vascular congestion and probable mild pulmonary edema. Electronically Signed   By: Ulyses Southward M.D.   On: 11/17/2018 14:32   Ct Coronary Morph W/cta Cor W/score W/ca W/cm &/or Wo/cm  Addendum Date: 11/05/2018   ADDENDUM REPORT: 11/05/2018 15:30 EXAM: OVER-READ INTERPRETATION  CT CHEST The following report is an over-read performed by radiologist Dr. Royal Piedra [facility] on 11/05/2018. This over-read does not include interpretation of cardiac or coronary anatomy or pathology. The cardiac CTA interpretation by the cardiologist is attached. COMPARISON:  None. FINDINGS: Extracardiac findings are described separately under dictation for contemporaneously obtained CTA chest, abdomen and pelvis. IMPRESSION: Please see separate dictation for contemporaneously obtained CTA chest, abdomen and pelvis dated 11/05/2018 for full description of relevant extracardiac findings. Electronically Signed   By: Trudie Reed M.D.   On: 11/05/2018 15:30   Result Date: 11/05/2018 CLINICAL DATA:  Aortic Stenosis EXAM: Cardiac TAVR CT TECHNIQUE: The patient was scanned on a Siemens Force 192 slice scanner. A 120 kV retrospective scan was triggered in the ascending thoracic aorta at 140 HU's. Gantry rotation speed was 250 msecs and collimation was .6 mm. No beta blockade or nitro were given. The 3D data set was reconstructed in 5% intervals of the R-R cycle. Systolic and diastolic phases were analyzed on a dedicated work station using MPR, MIP and VRT modes. The patient received 80 cc of contrast. FINDINGS: Aortic Valve: Tri leaflet calcified with restricted leaflet motion Aorta: Normal diameter no aneurysm moderate calcific atherosclerosis Sino-tubular Junction: 27 mm Ascending Thoracic Aorta: 34 mm Aortic Arch: 26 mm Descending Thoracic Aorta: 23 mm Sinus of Valsalva Measurements: Non-coronary: 27.5 mm Right  - coronary: 28.6 mm Left -   coronary: 27.6 mm Coronary Artery Height above Annulus: Left Main: 12.8 mm above annulus Right Coronary: 15.1 mm above annulus Virtual Basal Annulus Measurements: Maximum / Minimum Diameter: 25 mm x 17.4 mm Perimeter: 69 mm Area: 361 mm2 Coronary Arteries: Sufficient height above annulus for deployment Optimum Fluoroscopic Angle for Delivery: LAO 2 Caudal 4 degrees IMPRESSION: 1. Calcified tri leaflet AV with annular area of 361 mm2 suitable for a 23 mm Sapien 3 valve 2.  Normal aortic root 3.4 cm 3.  Optimum angiographic angle for deployment LAO 2 Caudal 4 degrees 4.  Coronary arteries sufficient height above annulus for deployment Theron Arista  Nishan Electronically Signed: By: Charlton Haws M.D. On: 11/05/2018 12:34   Dg Chest Port 1 View  Result Date: 11/18/2018 CLINICAL DATA:  Status post transcatheter aortic valve replacement. EXAM: PORTABLE CHEST 1 VIEW COMPARISON:  11/17/2018. FINDINGS: Stable enlarged cardiac silhouette and prominent pulmonary vasculature. Mild increase in prominence of the interstitial markings with Kerley lines. Probable minimal bilateral pleural effusions. Transcatheter aortic valve in satisfactory position in the frontal projection. Unremarkable bones. IMPRESSION: Cardiomegaly and mild changes of congestive heart failure with mild progression. Electronically Signed   By: Beckie Salts M.D.   On: 11/18/2018 15:51   Ct Angio Chest Aorta W &/or Wo Contrast  Result Date: 11/05/2018 CLINICAL DATA:  78 year old female with history of severe aortic stenosis. Preprocedural study prior to potential transcatheter aortic valve replacement (TAVR) procedure. EXAM: CT ANGIOGRAPHY CHEST, ABDOMEN AND PELVIS TECHNIQUE: Multidetector CT imaging through the chest, abdomen and pelvis was performed using the standard protocol during bolus administration of intravenous contrast. Multiplanar reconstructed images and MIPs were obtained and reviewed to evaluate the vascular anatomy.  CONTRAST:  OMNIPAQUE IOHEXOL 350 MG/ML SOLN COMPARISON:  CT the abdomen and pelvis 04/27/2017. FINDINGS: CTA CHEST FINDINGS Cardiovascular: Heart size is enlarged with left atrial dilatation. There is no significant pericardial fluid, thickening or pericardial calcification. There is aortic atherosclerosis, as well as atherosclerosis of the great vessels of the mediastinum and the coronary arteries, including calcified atherosclerotic plaque in the left main, left anterior descending and right coronary arteries. Severe thickening calcification of the aortic valve. Moderate to severe calcifications of the mitral annulus. Mediastinum/Lymph Nodes: No pathologically enlarged mediastinal or hilar lymph nodes. Esophagus is unremarkable in appearance. No axillary lymphadenopathy. Lungs/Pleura: No acute consolidative airspace disease. No pleural effusions. No suspicious appearing pulmonary nodules or masses are noted. Musculoskeletal/Soft Tissues: There are no aggressive appearing lytic or blastic lesions noted in the visualized portions of the skeleton. CTA ABDOMEN AND PELVIS FINDINGS Hepatobiliary: Liver has a shrunken appearance and nodular contour, indicative of cirrhosis. In segment 3 of the liver there is a 1.1 x 1.4 cm hypervascular area (axial image 86 of series 18). No intra or extrahepatic biliary ductal dilatation. Gallbladder is normal in appearance. Pancreas: No pancreatic mass. No pancreatic ductal dilatation. No pancreatic or peripancreatic fluid or inflammatory changes. Spleen: Wedge-shaped area of hypoperfusion in the spleen concerning for potential infarct (axial image 83 of series 18). Adrenals/Urinary Tract: Bilateral kidneys and bilateral adrenal glands are normal in appearance. No hydroureteronephrosis. Urinary bladder is normal in appearance. Stomach/Bowel: Normal appearance of the stomach. No pathologic dilatation of small bowel or colon. The appendix is not confidently identified and may be  surgically absent. Regardless, there are no inflammatory changes noted adjacent to the cecum to suggest the presence of an acute appendicitis at this time. Vascular/Lymphatic: Aortic atherosclerosis, without evidence of aneurysm or dissection in the abdominal or pelvic vasculature. Vascular findings and measurements pertinent to potential TAVR procedure, as detailed below. No lymphadenopathy noted in the abdomen or pelvis. Reproductive: Uterus and ovaries are unremarkable in appearance. Other: No significant volume of ascites.  No pneumoperitoneum. Musculoskeletal: There are no aggressive appearing lytic or blastic lesions noted in the visualized portions of the skeleton. VASCULAR MEASUREMENTS PERTINENT TO TAVR: AORTA: Minimal Aortic Diameter-12 x 9 mm Severity of Aortic Calcification-moderate RIGHT PELVIS: Right Common Iliac Artery - Minimal Diameter-6.8 x 6.2 mm Tortuosity-mild Calcification-moderate Right External Iliac Artery - Minimal Diameter-5.8 x 5.8 mm Tortuosity-moderate Calcification-none Right Common Femoral Artery - Minimal Diameter-6.1 x 6.1 mm Tortuosity-mild  Calcification-mild LEFT PELVIS: Left Common Iliac Artery - Minimal Diameter-6.2 x 5.1 mm Tortuosity-mild Calcification-moderate Left External Iliac Artery - Minimal Diameter-6.6 x 6.4 mm Tortuosity-moderate Calcification-none Left Common Femoral Artery - Minimal Diameter-6.0 x 5.8 mm Tortuosity-mild Calcification-mild Review of the MIP images confirms the above findings. IMPRESSION: 1. Vascular findings and measurements pertinent to potential TAVR procedure, as detailed above. 2. Thickening calcification of the aortic valve, compatible with the reported clinical history of severe aortic stenosis. 3. Aortic atherosclerosis, in addition to left main and 2 vessel coronary artery disease. Assessment for potential risk factor modification, dietary therapy or pharmacologic therapy may be warranted, if clinically indicated. 4. There is also moderate to  severe calcifications of the mitral annulus. 5. The liver is cirrhotic, and there is a hypervascular area in segment 3 of the liver. Although this is favored to represent a perfusion anomaly, given the cirrhosis, further evaluation with nonemergent abdominal MRI with and without IV gadolinium is strongly recommended in the near future to better characterize this finding, as the possibility of hepatocellular carcinoma is not excluded. 6. Wedge-shaped area of hypoperfusion in the spleen concerning for potential splenic infarct. This could alternatively be related to the early phase of enhancement. Attention at time of forthcoming abdominal MRI is recommended to better evaluate this finding during a multiphase examination. Electronically Signed   By: Trudie Reed M.D.   On: 11/05/2018 15:29   Vas US Carotid  Result Date: 11/05/2018 Carotid Arterial Duplex Study Indications: Pre TAVR. Limitations: high bifurcation Performing Technologist: Jeb Levering RDMS, RVT  Examination Guidelines: A complete evaluation includes B-mode imaging, spectral Doppler, color Doppler, and power Doppler as needed of all accessible portions of each vessel. Bilateral testing is considered an integral part of a complete examination. Limited examinations for reoccurring indications may be performed as noted.  Right Carotid Findings: +----------+--------+--------+--------+------------+--------+             PSV cm/s EDV cm/s Stenosis Describe     Comments  +----------+--------+--------+--------+------------+--------+  CCA Prox   63       14                                       +----------+--------+--------+--------+------------+--------+  CCA Distal 57       17                                       +----------+--------+--------+--------+------------+--------+  ICA Prox   57       16       1-39%    heterogenous           +----------+--------+--------+--------+------------+--------+  ICA Distal 86       30                                        +----------+--------+--------+--------+------------+--------+  ECA        91       20                                       +----------+--------+--------+--------+------------+--------+ +----------+--------+-------+----------------+-------------------+             PSV cm/s  EDV cms Describe         Arm Pressure (mmHG)  +----------+--------+-------+----------------+-------------------+  Subclavian 115              Multiphasic, WNL                      +----------+--------+-------+----------------+-------------------+ +---------+--------+--+--------+--+---------+  Vertebral PSV cm/s 82 EDV cm/s 17 Antegrade  +---------+--------+--+--------+--+---------+  Left Carotid Findings: +----------+--------+--------+--------+------------+--------+             PSV cm/s EDV cm/s Stenosis Describe     Comments  +----------+--------+--------+--------+------------+--------+  CCA Prox   73       15                                       +----------+--------+--------+--------+------------+--------+  CCA Distal 83       16                                       +----------+--------+--------+--------+------------+--------+  ICA Prox   70       23       1-39%    heterogenous           +----------+--------+--------+--------+------------+--------+  ICA Distal 65       19                                       +----------+--------+--------+--------+------------+--------+  ECA        108      9                                        +----------+--------+--------+--------+------------+--------+ +----------+--------+--------+----------------+-------------------+  Subclavian PSV cm/s EDV cm/s Describe         Arm Pressure (mmHG)  +----------+--------+--------+----------------+-------------------+             97                Multiphasic, WNL                      +----------+--------+--------+----------------+-------------------+ +---------+--------+--+--------+--+---------+  Vertebral PSV cm/s 60 EDV cm/s 17 Antegrade   +---------+--------+--+--------+--+---------+  Summary: Right Carotid: Velocities in the right ICA are consistent with a 1-39% stenosis. Left Carotid: Velocities in the left ICA are consistent with a 1-39% stenosis.  *See table(s) above for measurements and observations.  Electronically signed by Gretta Began MD on 11/05/2018 at 2:39:01 PM.    Final    Ct Angio Abd/pel W/ And/or W/o  Result Date: 11/05/2018 CLINICAL DATA:  78 year old female with history of severe aortic stenosis. Preprocedural study prior to potential transcatheter aortic valve replacement (TAVR) procedure. EXAM: CT ANGIOGRAPHY CHEST, ABDOMEN AND PELVIS TECHNIQUE: Multidetector CT imaging through the chest, abdomen and pelvis was performed using the standard protocol during bolus administration of intravenous contrast. Multiplanar reconstructed images and MIPs were obtained and reviewed to evaluate the vascular anatomy. CONTRAST:  OMNIPAQUE IOHEXOL 350 MG/ML SOLN COMPARISON:  CT the abdomen and pelvis 04/27/2017. FINDINGS: CTA CHEST FINDINGS Cardiovascular: Heart size is enlarged with left atrial dilatation. There is no significant pericardial fluid, thickening or pericardial calcification. There is aortic atherosclerosis,  as well as atherosclerosis of the great vessels of the mediastinum and the coronary arteries, including calcified atherosclerotic plaque in the left main, left anterior descending and right coronary arteries. Severe thickening calcification of the aortic valve. Moderate to severe calcifications of the mitral annulus. Mediastinum/Lymph Nodes: No pathologically enlarged mediastinal or hilar lymph nodes. Esophagus is unremarkable in appearance. No axillary lymphadenopathy. Lungs/Pleura: No acute consolidative airspace disease. No pleural effusions. No suspicious appearing pulmonary nodules or masses are noted. Musculoskeletal/Soft Tissues: There are no aggressive appearing lytic or blastic lesions noted in the visualized  portions of the skeleton. CTA ABDOMEN AND PELVIS FINDINGS Hepatobiliary: Liver has a shrunken appearance and nodular contour, indicative of cirrhosis. In segment 3 of the liver there is a 1.1 x 1.4 cm hypervascular area (axial image 86 of series 18). No intra or extrahepatic biliary ductal dilatation. Gallbladder is normal in appearance. Pancreas: No pancreatic mass. No pancreatic ductal dilatation. No pancreatic or peripancreatic fluid or inflammatory changes. Spleen: Wedge-shaped area of hypoperfusion in the spleen concerning for potential infarct (axial image 83 of series 18). Adrenals/Urinary Tract: Bilateral kidneys and bilateral adrenal glands are normal in appearance. No hydroureteronephrosis. Urinary bladder is normal in appearance. Stomach/Bowel: Normal appearance of the stomach. No pathologic dilatation of small bowel or colon. The appendix is not confidently identified and may be surgically absent. Regardless, there are no inflammatory changes noted adjacent to the cecum to suggest the presence of an acute appendicitis at this time. Vascular/Lymphatic: Aortic atherosclerosis, without evidence of aneurysm or dissection in the abdominal or pelvic vasculature. Vascular findings and measurements pertinent to potential TAVR procedure, as detailed below. No lymphadenopathy noted in the abdomen or pelvis. Reproductive: Uterus and ovaries are unremarkable in appearance. Other: No significant volume of ascites.  No pneumoperitoneum. Musculoskeletal: There are no aggressive appearing lytic or blastic lesions noted in the visualized portions of the skeleton. VASCULAR MEASUREMENTS PERTINENT TO TAVR: AORTA: Minimal Aortic Diameter-12 x 9 mm Severity of Aortic Calcification-moderate RIGHT PELVIS: Right Common Iliac Artery - Minimal Diameter-6.8 x 6.2 mm Tortuosity-mild Calcification-moderate Right External Iliac Artery - Minimal Diameter-5.8 x 5.8 mm Tortuosity-moderate Calcification-none Right Common Femoral Artery -  Minimal Diameter-6.1 x 6.1 mm Tortuosity-mild Calcification-mild LEFT PELVIS: Left Common Iliac Artery - Minimal Diameter-6.2 x 5.1 mm Tortuosity-mild Calcification-moderate Left External Iliac Artery - Minimal Diameter-6.6 x 6.4 mm Tortuosity-moderate Calcification-none Left Common Femoral Artery - Minimal Diameter-6.0 x 5.8 mm Tortuosity-mild Calcification-mild Review of the MIP images confirms the above findings. IMPRESSION: 1. Vascular findings and measurements pertinent to potential TAVR procedure, as detailed above. 2. Thickening calcification of the aortic valve, compatible with the reported clinical history of severe aortic stenosis. 3. Aortic atherosclerosis, in addition to left main and 2 vessel coronary artery disease. Assessment for potential risk factor modification, dietary therapy or pharmacologic therapy may be warranted, if clinically indicated. 4. There is also moderate to severe calcifications of the mitral annulus. 5. The liver is cirrhotic, and there is a hypervascular area in segment 3 of the liver. Although this is favored to represent a perfusion anomaly, given the cirrhosis, further evaluation with nonemergent abdominal MRI with and without IV gadolinium is strongly recommended in the near future to better characterize this finding, as the possibility of hepatocellular carcinoma is not excluded. 6. Wedge-shaped area of hypoperfusion in the spleen concerning for potential splenic infarct. This could alternatively be related to the early phase of enhancement. Attention at time of forthcoming abdominal MRI is recommended to better evaluate this finding during a multiphase  examination. Electronically Signed   By: Trudie Reed M.D.   On: 11/05/2018 15:29   Disposition   Pt is being discharged home today in good condition.  Follow-up Plans & Appointments    Follow-up Information    Janetta Hora, PA-C. Go on 11/27/2018.   Specialties:  Cardiology, Radiology Why:  @ 2:30pm,  please arrive at least 10 minutes early. We will reach out to you next week to see if you are feeling well. If doing well, we will not bring you into the office to minimize potential exposure of Covid 19 Contact information: [address] [place] [address] [phone]          Discharge Instructions    Amb Referral to Cardiac Rehabilitation   Complete by:  As directed    To [facility]   Diagnosis:  Valve Replacement   Valve:  Aortic Comment - TAVR      Discharge Medications   Allergies as of 11/19/2018      Reactions   Codeine Shortness Of Breath, Itching   Epinephrine Shortness Of Breath, Palpitations   Penicillins Anaphylaxis   Did it involve swelling of the face/tongue/throat, SOB, or low BP? Yes Did it involve sudden or severe rash/hives, skin peeling, or any reaction on the inside of your mouth or nose? No Did you need to seek medical attention at a hospital or doctor's office? Yes When did it last happen?40+ years If all above answers are NO, may proceed with cephalosporin use.   Prednisone Other (See Comments)   psychosis    Statins Other (See Comments)   Myalgias   Lidocaine Palpitations   Sulfa Antibiotics Rash      Medication List    TAKE these medications   acetaminophen 500 MG tablet Commonly known as:  TYLENOL Take 500 mg by mouth every 6 (six) hours as needed for moderate pain or headache.   allopurinol 100 MG tablet Commonly known as:  ZYLOPRIM Take 100 mg by mouth daily.   apixaban 5 MG Tabs tablet Commonly known as:  Eliquis Take 1 tablet (5 mg total) by mouth 2 (two) times daily.   aspirin EC 81 MG tablet Take 81 mg by mouth daily.   calcium carbonate 750 MG chewable tablet Commonly known as:  TUMS EX Chew 1 tablet by mouth daily as needed for heartburn.   ferrous sulfate 325 (65 FE) MG tablet Take 325 mg by mouth daily.   insulin NPH Human 100 UNIT/ML injection Commonly known as:  HUMULIN N,NOVOLIN  N Inject 15 Units into the skin 2 (two) times daily.   levalbuterol 45 MCG/ACT inhaler Commonly known as:  XOPENEX HFA Inhale 2 puffs into the lungs every 4 (four) hours as needed for wheezing.   losartan 25 MG tablet Commonly known as:  COZAAR Take 25 mg by mouth daily.   metFORMIN 1000 MG tablet Commonly known as:  GLUCOPHAGE Take 1,000 mg by mouth 2 (two) times daily with a meal. Notes to patient:  Please hold for 48 hours after TAVR due to contrast dye exposure. Restart 11/20/18 PM    metoprolol tartrate 50 MG tablet Commonly known as:  LOPRESSOR Take 50 mg by mouth 2 (two) times daily.   nitroGLYCERIN 0.4 MG SL tablet Commonly known as:  NITROSTAT Place 1 tablet (0.4 mg total) under the tongue every 5 (five) minutes x 3 doses as needed for chest pain (or severe SOB).   potassium chloride 10 MEQ CR capsule Commonly known  as:  MICRO-K Take 2 capsules (20 mEq total) by mouth daily. What changed:  how much to take   PRENATAL PO Take 1 tablet by mouth daily.   torsemide 10 MG tablet Commonly known as:  DEMADEX Take 10 mg by mouth 2 (two) times daily.         Outstanding Labs/Studies  none  Duration of Discharge Encounter   Greater than 30 minutes including physician time.  Signed, Cline Crock, PA-C 11/19/2018, 12:27 PM [phone]  I have personally seen and examined this patient. I agree with the assessment and plan as outlined above.  See my note from 11/19/18 for details.   Verne Carrow 11/20/2018 7:26 AM

## 2018-11-19 NOTE — Progress Notes (Signed)
Pt ambulated 200 ft with walker in hallway uneventfully on RA. SpO2 upon return to room 95-97%. Pt HR 110s-140s Afib with activity/ambulation but 70s-90s while sitting. Will convey to Day RN.

## 2018-11-19 NOTE — Discharge Instructions (Signed)

## 2018-11-20 ENCOUNTER — Telehealth: Payer: Self-pay

## 2018-11-20 ENCOUNTER — Encounter (HOSPITAL_COMMUNITY): Payer: Self-pay | Admitting: Cardiovascular Disease

## 2018-11-20 NOTE — Telephone Encounter (Signed)
Patient's daughter Arline Asp contacted regarding discharge from [facility] on 11/19/2018.  Patient understands to follow up with provider Cline Crock PA-C on 11/27/2018 at 2:30 PM at [facility] location. Patient understands discharge instructions? yes Patient understands medications and regiment? yes Patient understands to bring all medications to this visit? Yes  Per Arline Asp the pt's pulse is dipping into the 50's and her SBP at times has been in the 90's.  She feels that the pt's metoprolol dosage needs to be decreased.  Discussed with [name] PA-C and the pt will decrease Metoprolol to 25mg  twice a day. Arline Asp will continue to monitor the pt's BP and pulse and contact the office with any additional questions or concerns.

## 2018-11-27 ENCOUNTER — Ambulatory Visit: Payer: Medicare HMO | Admitting: Physician Assistant

## 2018-12-02 ENCOUNTER — Telehealth: Payer: Self-pay | Admitting: Physician Assistant

## 2018-12-02 NOTE — Telephone Encounter (Signed)
Left message for patient to call back to setup Webex appointment.

## 2018-12-03 ENCOUNTER — Telehealth (INDEPENDENT_AMBULATORY_CARE_PROVIDER_SITE_OTHER): Payer: Medicare HMO | Admitting: Physician Assistant

## 2018-12-03 ENCOUNTER — Other Ambulatory Visit: Payer: Self-pay

## 2018-12-03 VITALS — BP 114/81 | HR 77 | Wt 198.0 lb

## 2018-12-03 DIAGNOSIS — I10 Essential (primary) hypertension: Secondary | ICD-10-CM | POA: Diagnosis not present

## 2018-12-03 DIAGNOSIS — I5032 Chronic diastolic (congestive) heart failure: Secondary | ICD-10-CM | POA: Diagnosis not present

## 2018-12-03 DIAGNOSIS — I482 Chronic atrial fibrillation, unspecified: Secondary | ICD-10-CM

## 2018-12-03 DIAGNOSIS — Z952 Presence of prosthetic heart valve: Secondary | ICD-10-CM

## 2018-12-03 MED ORDER — CLINDAMYCIN HCL 300 MG PO CAPS: 600.0000 mg | ORAL_CAPSULE | ORAL | 12 refills | Status: DC

## 2018-12-03 NOTE — Progress Notes (Signed)
Thanks Katie.

## 2018-12-03 NOTE — Progress Notes (Signed)
[facility]ENTER   MULTIDISCIPLINARY HEART VALVE TEAM   Evaluation Performed:  Follow-up visit  This visit type was conducted due to national recommendations for restrictions regarding the COVID-19 Pandemic (e.g. social distancing).  This format is felt to be most appropriate for this patient at this time.  All issues noted in this document were discussed and addressed.  No physical exam was performed (except for noted visual exam findings with Telehealth visits).  The patient has consented to conduct a Telehealth visit and understands insurance will be billed.   Date:  12/03/2018   ID:  Taylor Jenkins, DOB 06/25/1941, MRN [id number]  Patient Location:  [address] [place] [address]   Provider location:   [address] [place], [place] [address]  PCP:  Escajeda, Richard, MD  Cardiologist: Dr. Hilty/ Dr. McAlhany & Dr. Bartle (TAVR) Electrophysiologist:  None   Chief Complaint:  TOC s/p TAVR  History of Present Illness:    Taylor Jenkins is a 78 y.o. female with a history of chronic diastolic CHF, DMT2, HTN, HLD, chronic atrial fib on Eliquis, iron deficiency anemia on iron infusions, and severe AS s/p TAVR (11/18/2018)  who presents via audio/video conferencing for a telehealth visit today.    The patient does not have symptoms concerning for COVID-19 infection (fever, chills, cough, or new SHORTNESS OF BREATH).   Her aortic stenosis had been followed for several years in [place] by Dr. Khalil.She said that she was having symptoms but nothing was being done.she was evaluated in [place] and was seen by Dr. Turner. She was admitted to [facility] in August 2018 with marked anemia with a hemoglobin of 6.6, heme positive stools, and congestive heart failure. She did have an echocardiogram done here in August 2018 which showed severe aortic stenosis with a mean gradient of 49 mmHg and a peak of 77 mmHg. Aortic valve area at that time was 0.65 cm. Left ventricular  ejection fraction was normal. She was transfused and felt better. GI was consulted and endoscopic evaluation was recommended. She subsequently underwent outpatient upper endoscopy by Dr. Mann which showed AVMs which were apparently clipped. She then went back for cardiology follow-up in [place] and continued to have fatigue and exertional shortness of breath. She eventually self referred to Dr. Hilty who she saw last month. A follow-up echocardiogram on 10/13/2018 showed severe aortic stenosis with a mean gradient of 58 mmHg and aortic valve area of 0.75 cm. There is moderate aortic insufficiency. Left ventricular ejection fraction is 60-65%.There is moderate left ventricular hypertrophy. She underwent cardiac catheterization on 10/30/2018 which showed mild to moderate nonobstructive coronary disease with about 30-40% mid LAD stenosis which was unchanged from prior catheterization in 2016. There is also about 40% ostial proximal left circumflex stenosis. The mean gradient across aortic valve was 43 mmHg with a peak to peak gradient of 65 mmHg and a valve area of 0.64 cm. Of note ,she was not on OAC for afib given hx of AVMs with GI bleeding.  She underwent successful TAVR with a 23 mm Edwards Sapien 3 THV via the TF approach on 11/18/18. Post op echo showed a normally functioning TAVR valve with mean gradient of 7275IhBuyer, retAR42m50IhBuyer, retAR65m(860IhBuyer, retAR77m77IhBuyer, retAR58m(212IhBuyer, retAR74m(870IhBuyer, retAR19mIhBuyer, retAR47m(60IhBuyer, retAR74m54IhBuyer, retAR71m63IhBuyer, retAR73mIhBuyer, retAR63m21IhBuyer, retAR65mIhBuyer, retARAMARK Corporationod AI. She had evidence of mild volume overload and was diuresed. She was started on Eliquis given afib and high CHADSVASc score. She was also discharged on aspirin.   Today she presents for video Telemedicine visit. Down 15 lbs. She is really watching  her salt intake. She has been doing a lot of walking around the house and feeling better and better everyday. No CP or SOB. No LE edema, orthopnea or PND. No dizziness or syncope. No blood in stool or urine. No palpitations.     Prior CV studies:   The following studies were reviewed today:  TAVR OPERATIVE NOTE    Date of Procedure:11/18/2018  Preoperative Diagnosis:Severe Aortic Stenosis   Postoperative Diagnosis:Same   Procedure:   Transcatheter Aortic Valve Replacement - PercutaneousRightTransfemoral Approach Edwards Sapien 3 THV (size59mm, model # 9600TFX, serial # [id number])  Co-Surgeons:Bryan Jennefer Bravo, MD and Verne Carrow, MD   Anesthesiologist:C. Jean Rosenthal, MD  Delano Metz, MD  Pre-operative Echo Findings: ? Severe aortic stenosis ? Normalleft ventricular systolic function  Post-operative Echo Findings: ? Trivial to mildparavalvular leak ? Normalleft ventricular systolic function  _____________   Echo 11/19/18: IMPRESSIONS  1. The left ventricle has normal systolic function with an ejection fraction of 60-65%. The cavity size was normal. There is mildly increased left ventricular wall thickness. Left ventricular diastolic function could not be evaluated secondary to atrial  fibrillation.  2. The right ventricle has normal systolic function. The cavity was normal. There is no increase in right ventricular wall thickness.  3. Right atrial size was mildly dilated.  4. Severe thickening of the mitral valve leaflet. Moderate calcification of the mitral valve leaflet. There is moderate mitral annular calcification present.  5. Aortic valve regurgitation is mild to moderate by color flow Doppler.  6. There is a perivalvular leak around the TAVR which produces mild - moderate AI.  7. The interatrial septum was not assessed.   Past Medical History:  Diagnosis Date  . Anemia   . Aortic atherosclerosis (HCC) 04/29/2017  . Asthma   . Atrial fibrillation, persistent   . CAD (coronary artery disease) 2016   non-obstructive at cath  . Chronic diastolic CHF (congestive heart failure) (HCC)   . Diabetes mellitus without complication (HCC)     type 2  . HLD (hyperlipidemia)   . Hypertension   . S/P TAVR (transcatheter aortic valve replacement)   . Severe aortic stenosis 04/26/2017   Past Surgical History:  Procedure Laterality Date  . LEFT HEART CATHETERIZATION WITH CORONARY ANGIOGRAM N/A 09/27/2014   Non-obstructive disease, CRF reduction recommended. Procedure: LEFT HEART CATHETERIZATION WITH CORONARY ANGIOGRAM;  Surgeon: Micheline Chapman, MD;  Location: [facility] CATH LAB;  Service: Cardiovascular;  Laterality: N/A;  . RIGHT/LEFT HEART CATH AND CORONARY ANGIOGRAPHY N/A 10/30/2018   Procedure: RIGHT/LEFT HEART CATH AND CORONARY ANGIOGRAPHY;  Surgeon: Kathleene Hazel, MD;  Location: [facility] INVASIVE CV LAB;  Service: Cardiovascular;  Laterality: N/A;  . TEE WITHOUT CARDIOVERSION N/A 11/18/2018   Procedure: TRANSESOPHAGEAL ECHOCARDIOGRAM (TEE);  Surgeon: Kathleene Hazel, MD;  Location: [facility] INVASIVE CV LAB;  Service: Open Heart Surgery;  Laterality: N/A;  . TRANSCATHETER AORTIC VALVE REPLACEMENT, TRANSFEMORAL N/A 11/18/2018   Procedure: TRANSCATHETER AORTIC VALVE REPLACEMENT, TRANSFEMORAL;  Surgeon: Kathleene Hazel, MD;  Location: [facility] INVASIVE CV LAB;  Service: Open Heart Surgery;  Laterality: N/A;  . TUBAL LIGATION       No outpatient medications have been marked as taking for the 12/03/18 encounter (Telemedicine) with Janetta Hora, PA-C.     Allergies:   Codeine; Epinephrine; Penicillins; Prednisone; Statins; Lidocaine; and Sulfa antibiotics   Social History   Tobacco Use  . Smoking status: Never Smoker  . Smokeless tobacco: Never Used  Substance Use Topics  . Alcohol use: No  Alcohol/week: 0.0 standard drinks  . Drug use: No     Family Hx: The patient's family history includes Heart attack (age of onset: 35) in her brother; Transient ischemic attack in her mother.  ROS:   Please see the history of present illness.    All other systems reviewed and are negative.   Labs/Other Tests and Data Reviewed:     Recent Labs: 11/17/2018: ALT 34; B Natriuretic Peptide 262.8 11/19/2018: BUN 19; Creatinine, Ser 0.81; Hemoglobin 9.2; Magnesium 2.1; Platelets 183; Potassium 4.7; Sodium 138   Recent Lipid Panel Lab Results  Component Value Date/Time   CHOL 219 (H) 09/26/2014 12:30 AM   TRIG 231 (H) 09/26/2014 12:30 AM   HDL 43 09/26/2014 12:30 AM   CHOLHDL 5.1 09/26/2014 12:30 AM   LDLCALC 130 (H) 09/26/2014 12:30 AM    Wt Readings from Last 3 Encounters:  12/03/18 198 lb (89.8 kg)  11/19/18 213 lb (96.6 kg)  11/17/18 206 lb 5.6 oz (93.6 kg)     Exam:    Vitals:   12/03/18 0949  BP: 114/81  Pulse: 77   Appears well over video. Alert and oriented and acting appropriately.   ASSESSMENT & PLAN:    Severe AS s/p TAVR: doing excellent and feeling better everyday. SBE prophylaxis discussed; I have RX'd clindamycin due to a PCN allergy. Groin sites are stable. HR in 70s and no s/s worrisome for heart block. I will see her back in May for 1 month follow up (pushed out given Covid 19 pandemic).  Chronic diastolic CHF: swelling completely resolved. She is down 15 lbs since discharge. Continue current dose of Torsemide 10 mg BID.   Chronic atrial fibrillation: rate well controlled. Tolerating Eliquis well.  HTN: BP well controlled. Continue current regimen   COVID-19 Education: The signs and symptoms of COVID-19 were discussed with the patient and how to seek care for testing (follow up with PCP or arrange E-visit).  The importance of social distancing was discussed today.  Patient Risk:   After full review of this patients clinical status, I feel that they are at least moderate risk at this time.  Time:   Today, I have spent 25 minutes with the patient with telehealth technology discussing post surgical recovery, symptoms and instructions going forward.     Medication Adjustments/Labs and Tests Ordered: Current medicines are reviewed at length with the patient today.  Concerns regarding  medicines are outlined above.  Tests Ordered: No orders of the defined types were placed in this encounter.  Medication Changes: Meds ordered this encounter  Medications  . clindamycin (CLEOCIN) 300 MG capsule    Sig: Take 2 capsules (600 mg total) by mouth as directed. Take 2 capsules 1 hour prior to dental work including cleanings.    Dispense:  6 capsule    Refill:  12    Order Specific Question:   Supervising Provider    Answer:   Tonny Bollman [[id number]]    Disposition:  Follow up in May   Signed, Breonia Kirstein, PA-C  12/03/2018 11:31 AM    [facility] [address] [place], [place], [place]  [address] Phone: [phone]; Fax: [phone]

## 2018-12-03 NOTE — Patient Instructions (Signed)
Hello Ms Taylor Jenkins,   It was so nice to talk to you on the phone today. I am so glad you are doing so well. I just wanted to send you a recap of our discussion.   Please continue taking antibiotics prior to any dental work including cleanings (clindamycin called into your pharmacy). Continue all your medications including Eliquis and aspirin.   Your 1 month echo has been rescheduled to May due to Covid 19 pandemic.  All appointment details are attached to this letter in your "after visit summary."  Please call us with any questions or concerns you may have and please stay safe during these uncertain times.  [name]

## 2018-12-04 ENCOUNTER — Telehealth: Payer: Medicare HMO | Admitting: Physician Assistant

## 2018-12-05 ENCOUNTER — Encounter: Payer: Self-pay | Admitting: Surgery

## 2018-12-18 ENCOUNTER — Other Ambulatory Visit (HOSPITAL_COMMUNITY): Payer: Medicare HMO

## 2018-12-18 ENCOUNTER — Ambulatory Visit: Payer: Medicare HMO | Admitting: Physician Assistant

## 2019-01-05 ENCOUNTER — Telehealth: Payer: Self-pay | Admitting: Internal Medicine

## 2019-01-05 NOTE — Telephone Encounter (Signed)
New message     Called pt to convert regular 01-08-19 office visit to a virtual visit.  Pt gave consent for virtual visit.  YOUR CARDIOLOGY TEAM HAS ARRANGED FOR AN E-VISIT FOR YOUR APPOINTMENT - PLEASE REVIEW IMPORTANT INFORMATION BELOW SEVERAL DAYS PRIOR TO YOUR APPOINTMENT  Due to the recent COVID-19 pandemic, we are transitioning in-person office visits to tele-medicine visits in an effort to decrease unnecessary exposure to our patients, their families, and staff. These visits are billed to your insurance just like a normal visit is. We also encourage you to sign up for MyChart if you have not already done so. You will need a smartphone if possible. For patients that do not have this, we can still complete the visit using a regular telephone but do prefer a smartphone to enable video when possible. You may have a family member that lives with you that can help. If possible, we also ask that you have a blood pressure cuff and scale at home to measure your blood pressure, heart rate and weight prior to your scheduled appointment. Patients with clinical needs that need an in-person evaluation and testing will still be able to come to the office if absolutely necessary. If you have any questions, feel free to call our office.     YOUR PROVIDER WILL BE USING THE FOLLOWING PLATFORM TO COMPLETE YOUR VISIT:  Doximity   IF USING MYCHART - How to Download the MyChart App to Your SmartPhone   - If Apple, go to [employer] and type in MyChart in the search bar and download the app. If Android, ask patient to go to [employer] and type in [place] in the search bar and download the app. The app is free but as with any other app downloads, your phone may require you to verify saved payment information or Apple/Android password.  - You will need to then log into the app with your MyChart username and password, and select [facility] as your healthcare provider to link the account.  - When it is time for  your visit, go to the MyChart app, find appointments, and click Begin Video Visit. Be sure to Select Allow for your device to access the Microphone and Camera for your visit. You will then be connected, and your provider will be with you shortly.  **If you have any issues connecting or need assistance, please contact MyChart service desk [phone])**  **If using a computer, in order to ensure the best quality for your visit, you will need to use either of the following Internet Browsers: Agricultural consultant or Microsoft Edge**   IF USING DOXIMITY or [email or web address].ME - The staff will give you instructions on receiving your link to join the meeting the day of your visit.      2-3 DAYS BEFORE YOUR APPOINTMENT  You will receive a telephone call from one of our HeartCare team members - your caller ID may say "Unknown caller." If this is a video visit, we will walk you through how to get the video launched on your phone. We will remind you check your blood pressure, heart rate and weight prior to your scheduled appointment. If you have an Apple Watch or Kardia, please upload any pertinent ECG strips the day before or morning of your appointment to MyChart. Our staff will also make sure you have reviewed the consent and agree to move forward with your scheduled tele-health visit.     THE DAY OF YOUR APPOINTMENT  Approximately  15 minutes prior to your scheduled appointment, you will receive a telephone call from one of [facility] team - your caller ID may say "Unknown caller."  Our staff will confirm medications, vital signs for the day and any symptoms you may be experiencing. Please have this information available prior to the time of visit start. It may also be helpful for you to have a pad of paper and pen handy for any instructions given during your visit. They will also walk you through joining the smartphone meeting if this is a video visit.    CONSENT FOR TELE-HEALTH VISIT - PLEASE  REVIEW  I hereby voluntarily request, consent and authorize [facility] and its employed or contracted physicians, physician assistants, nurse practitioners or other licensed health care professionals (the Practitioner), to provide me with telemedicine health care services (the Services") as deemed necessary by the treating Practitioner. I acknowledge and consent to receive the Services by the Practitioner via telemedicine. I understand that the telemedicine visit will involve communicating with the Practitioner through live audiovisual communication technology and the disclosure of certain medical information by electronic transmission. I acknowledge that I have been given the opportunity to request an in-person assessment or other available alternative prior to the telemedicine visit and am voluntarily participating in the telemedicine visit.  I understand that I have the right to withhold or withdraw my consent to the use of telemedicine in the course of my care at any time, without affecting my right to future care or treatment, and that the Practitioner or I may terminate the telemedicine visit at any time. I understand that I have the right to inspect all information obtained and/or recorded in the course of the telemedicine visit and may receive copies of available information for a reasonable fee.  I understand that some of the potential risks of receiving the Services via telemedicine include:   Delay or interruption in medical evaluation due to technological equipment failure or disruption;  Information transmitted may not be sufficient (e.g. poor resolution of images) to allow for appropriate medical decision making by the Practitioner; and/or   In rare instances, security protocols could fail, causing a breach of personal health information.  Furthermore, I acknowledge that it is my responsibility to provide information about my medical history, conditions and care that is complete and  accurate to the best of my ability. I acknowledge that Practitioner's advice, recommendations, and/or decision may be based on factors not within their control, such as incomplete or inaccurate data provided by me or distortions of diagnostic images or specimens that may result from electronic transmissions. I understand that the practice of medicine is not an exact science and that Practitioner makes no warranties or guarantees regarding treatment outcomes. I acknowledge that I will receive a copy of this consent concurrently upon execution via email to the email address I last provided but may also request a printed copy by calling the office of [facility].    I understand that my insurance will be billed for this visit.   I have read or had this consent read to me.  I understand the contents of this consent, which adequately explains the benefits and risks of the Services being provided via telemedicine.   I have been provided ample opportunity to ask questions regarding this consent and the Services and have had my questions answered to my satisfaction.  I give my informed consent for the services to be provided through the use of telemedicine in my medical care  By participating in this telemedicine visit I agree to the above.

## 2019-01-07 ENCOUNTER — Telehealth: Payer: Self-pay | Admitting: Internal Medicine

## 2019-01-07 ENCOUNTER — Other Ambulatory Visit: Payer: Self-pay

## 2019-01-07 NOTE — Telephone Encounter (Signed)
Smartphone/consent/ my chart/ pre reg completed °

## 2019-01-08 ENCOUNTER — Encounter: Payer: Self-pay | Admitting: Internal Medicine

## 2019-01-08 ENCOUNTER — Other Ambulatory Visit: Payer: Self-pay | Admitting: Physician Assistant

## 2019-01-08 ENCOUNTER — Telehealth (INDEPENDENT_AMBULATORY_CARE_PROVIDER_SITE_OTHER): Payer: Medicare HMO | Admitting: Internal Medicine

## 2019-01-08 ENCOUNTER — Ambulatory Visit: Payer: Medicare HMO | Admitting: Internal Medicine

## 2019-01-08 VITALS — BP 152/74 | HR 84 | Ht <= 58 in | Wt 203.0 lb

## 2019-01-08 DIAGNOSIS — Z952 Presence of prosthetic heart valve: Secondary | ICD-10-CM | POA: Diagnosis not present

## 2019-01-08 DIAGNOSIS — R0609 Other forms of dyspnea: Secondary | ICD-10-CM | POA: Diagnosis not present

## 2019-01-08 DIAGNOSIS — I10 Essential (primary) hypertension: Secondary | ICD-10-CM

## 2019-01-08 DIAGNOSIS — I5032 Chronic diastolic (congestive) heart failure: Secondary | ICD-10-CM

## 2019-01-08 DIAGNOSIS — I4819 Other persistent atrial fibrillation: Secondary | ICD-10-CM

## 2019-01-08 DIAGNOSIS — R0600 Dyspnea, unspecified: Secondary | ICD-10-CM

## 2019-01-08 DIAGNOSIS — Z7189 Other specified counseling: Secondary | ICD-10-CM

## 2019-01-08 DIAGNOSIS — R0602 Shortness of breath: Secondary | ICD-10-CM

## 2019-01-08 DIAGNOSIS — I482 Chronic atrial fibrillation, unspecified: Secondary | ICD-10-CM

## 2019-01-08 MED ORDER — METOPROLOL TARTRATE 50 MG PO TABS: 50.0000 mg | ORAL_TABLET | Freq: Two times a day (BID) | ORAL | 3 refills | Status: DC

## 2019-01-08 NOTE — Progress Notes (Addendum)
Virtual Visit via Video Note   This visit type was conducted due to national recommendations for restrictions regarding the COVID-19 Pandemic (e.g. social distancing) in an effort to limit this patient's exposure and mitigate transmission in our community.  Due to her co-morbid illnesses, this patient is at least at moderate risk for complications without adequate follow up.  This format is felt to be most appropriate for this patient at this time.  All issues noted in this document were discussed and addressed.  A limited physical exam was performed with this format.  Please refer to the patient's chart for her consent to telehealth for [facility].   Evaluation Performed:  Doximity video visit  Date:  01/08/2019   ID:  Taylor Jenkins, DOB Sep 22, 1940, MRN [id number]  Patient Location:  [address] [place] [address]  Provider location:   [address], [address] [place], [place] [address]  PCP:  Tarri Fuller, MD  Cardiologist:  No primary care provider on file. Electrophysiologist:  None   Chief Complaint:  Shortness of breath  History of Present Illness:    Taylor Jenkins is a 78 y.o. female who presents via audio/video conferencing for a telehealth visit today.  Taylor Jenkins was seen today in follow-up regarding recent TAVR in March 2020.  She has a history of severe aortic stenosis and atrial fibrillation which is been longstanding persistent as well as diabetes, hypertension and dyslipidemia and chronic diastolic heart failure symptoms.  She underwent left heart catheterization prior to the procedure which showed mild to moderate nonobstructive coronary disease and ultimately underwent placement of a 23 mm Edwards Sapien 3 TAVR valve which was considered successful.  A repeat echo on November 19, 2018 showed normal LV function, a mean aortic valve gradient of 10 mmHg, and mild to moderate perivalvular leak.  She has been maintained on torsemide 10 mg twice daily and had  initial weight loss down to 196 pounds but has crept up to 203 pounds and is stable at this weight.  She denies any orthopnea, lower extremity edema or associated symptoms, but does get short of breath with notable increase in heart rate doing activities such as housework or walking upstairs.  She was also noted to have some periprocedural anemia but did not require blood transfusion.  The patient does not have symptoms concerning for COVID-19 infection (fever, chills, cough, or new SHORTNESS OF BREATH).    Prior CV studies:   The following studies were reviewed today:  TAVR Labwork  PMHx:  Past Medical History:  Diagnosis Date   Anemia    Aortic atherosclerosis (HCC) 04/29/2017   Asthma    Atrial fibrillation, persistent    CAD (coronary artery disease) 2016   non-obstructive at cath   Chronic diastolic CHF (congestive heart failure) (HCC)    Diabetes mellitus without complication (HCC)    type 2   HLD (hyperlipidemia)    Hypertension    S/P TAVR (transcatheter aortic valve replacement)    Severe aortic stenosis 04/26/2017    Past Surgical History:  Procedure Laterality Date   LEFT HEART CATHETERIZATION WITH CORONARY ANGIOGRAM N/A 09/27/2014   Non-obstructive disease, CRF reduction recommended. Procedure: LEFT HEART CATHETERIZATION WITH CORONARY ANGIOGRAM;  Surgeon: Micheline Chapman, MD;  Location: [facility] CATH LAB;  Service: Cardiovascular;  Laterality: N/A;   RIGHT/LEFT HEART CATH AND CORONARY ANGIOGRAPHY N/A 10/30/2018   Procedure: RIGHT/LEFT HEART CATH AND CORONARY ANGIOGRAPHY;  Surgeon: Kathleene Hazel, MD;  Location: [facility] INVASIVE CV LAB;  Service: Cardiovascular;  Laterality: N/A;   TEE WITHOUT CARDIOVERSION N/A 11/18/2018   Procedure: TRANSESOPHAGEAL ECHOCARDIOGRAM (TEE);  Surgeon: Kathleene Hazel, MD;  Location: [facility] INVASIVE CV LAB;  Service: Open Heart Surgery;  Laterality: N/A;   TRANSCATHETER AORTIC VALVE REPLACEMENT, TRANSFEMORAL N/A 11/18/2018    Procedure: TRANSCATHETER AORTIC VALVE REPLACEMENT, TRANSFEMORAL;  Surgeon: Kathleene Hazel, MD;  Location: [facility] INVASIVE CV LAB;  Service: Open Heart Surgery;  Laterality: N/A;   TUBAL LIGATION      FAMHx:  Family History  Problem Relation Age of Onset   Transient ischemic attack Mother    Heart attack Brother 13    SOCHx:   reports that she has never smoked. She has never used smokeless tobacco. She reports that she does not drink alcohol or use drugs.  ALLERGIES:  Allergies  Allergen Reactions   Codeine Shortness Of Breath and Itching   Epinephrine Shortness Of Breath and Palpitations   Penicillins Anaphylaxis    Did it involve swelling of the face/tongue/throat, SOB, or low BP? Yes Did it involve sudden or severe rash/hives, skin peeling, or any reaction on the inside of your mouth or nose? No Did you need to seek medical attention at a hospital or doctor's office? Yes When did it last happen?40+ years If all above answers are NO, may proceed with cephalosporin use.    Prednisone Other (See Comments)    psychosis    Statins Other (See Comments)    Myalgias    Lidocaine Palpitations   Sulfa Antibiotics Rash    MEDS:  Current Meds  Medication Sig   acetaminophen (TYLENOL) 500 MG tablet Take 500 mg by mouth every 6 (six) hours as needed for moderate pain or headache.   allopurinol (ZYLOPRIM) 100 MG tablet Take 1 tablet (100 mg total) by mouth daily.   apixaban (ELIQUIS) 5 MG TABS tablet Take 1 tablet (5 mg total) by mouth 2 (two) times daily.   aspirin EC 81 MG tablet Take 81 mg by mouth daily.   calcium carbonate (TUMS EX) 750 MG chewable tablet Chew 1 tablet by mouth daily as needed for heartburn.   clindamycin (CLEOCIN) 300 MG capsule Take 2 capsules (600 mg total) by mouth as directed. Take 2 capsules 1 hour prior to dental work including cleanings.   ferrous sulfate 325 (65 FE) MG tablet Take 325 mg by mouth daily.   insulin NPH Human  (HUMULIN N,NOVOLIN N) 100 UNIT/ML injection Inject 15 Units into the skin 2 (two) times daily.    levalbuterol (XOPENEX HFA) 45 MCG/ACT inhaler Inhale 2 puffs into the lungs every 4 (four) hours as needed for wheezing.   losartan (COZAAR) 25 MG tablet Take 25 mg by mouth daily.   metFORMIN (GLUCOPHAGE) 1000 MG tablet Take 1,000 mg by mouth 2 (two) times daily with a meal.   metoprolol tartrate (LOPRESSOR) 50 MG tablet Take 1 tablet (50 mg total) by mouth 2 (two) times daily.   nitroGLYCERIN (NITROSTAT) 0.4 MG SL tablet Place 1 tablet (0.4 mg total) under the tongue every 5 (five) minutes x 3 doses as needed for chest pain (or severe SOB).   potassium chloride (MICRO-K) 10 MEQ CR capsule Take 2 capsules (20 mEq total) by mouth daily. (Patient taking differently: Take 10 mEq by mouth daily. )   Prenatal Vit-Fe Fumarate-FA (PRENATAL PO) Take 1 tablet by mouth daily.   torsemide (DEMADEX) 10 MG tablet Take 1 tablet (10 mg total) by mouth 2 (two) times daily.   [DISCONTINUED]  metoprolol tartrate (LOPRESSOR) 50 MG tablet Take 50 mg by mouth. Take one-half tablet by mouth twice a day     ROS: Pertinent items noted in HPI and remainder of comprehensive ROS otherwise negative.  Labs/Other Tests and Data Reviewed:    Recent Labs: 11/17/2018: ALT 34; B Natriuretic Peptide 262.8 11/19/2018: BUN 19; Creatinine, Ser 0.81; Hemoglobin 9.2; Magnesium 2.1; Platelets 183; Potassium 4.7; Sodium 138   Recent Lipid Panel Lab Results  Component Value Date/Time   CHOL 219 (H) 09/26/2014 12:30 AM   TRIG 231 (H) 09/26/2014 12:30 AM   HDL 43 09/26/2014 12:30 AM   CHOLHDL 5.1 09/26/2014 12:30 AM   LDLCALC 130 (H) 09/26/2014 12:30 AM    Wt Readings from Last 3 Encounters:  01/08/19 203 lb (92.1 kg)  12/03/18 198 lb (89.8 kg)  11/19/18 213 lb (96.6 kg)     Exam:    Vital Signs:  BP (!) 152/74    Pulse 84    Ht  (1.448 m)    Wt 203 lb (92.1 kg)    SpO2 93%    BMI 43.93 kg/m    General  appearance: alert, no distress and morbidly obese Lungs: No audible wheezing or visual respiratory difficulty Abdomen: obese Extremities: extremities normal, atraumatic, no cyanosis or edema Skin: pale Neurologic: Mental status: Alert, oriented, thought content appropriate Psych: Pleasant  ASSESSMENT & PLAN:    1. Severe aortic stenosis s/p 23 mm Edwards Sapien 3 TAVR (11/2018) - mild to moderate perivavular leak 2. Longstanding persistent atrial fibrillation on Eliquis 3. Mild to moderate non-obstructive CAD 4. Chronic diastolic CHF 5. Hypertension 6. Dyslipidemia 7. Anemia  Taylor Jenkins has some shortness of breath and increases in heart rate with minimal exertion.  Although her weight is come down with diuretics, she continues to have some exertional symptoms.  She says it is much better than before her surgery however does not seem normal to her.  Blood pressure is somewhat elevated today.  It has crept up recently.  I advised her to increase her metoprolol further to 50 mg twice daily.  She may need some lab work including a repeat CBC to assess for possible anemia given the fact that she is on Eliquis and aspirin and did have anemia peri-procedurally with a hemoglobin of 9.2.  Otherwise I am not certain that her mild to moderate perivalvular leak is necessarily the cause of her dyspnea.  Rather, it may be diastolic dysfunction in the setting of tachycardia.  She also has mitral annular calcification but no clear measurements supportive of mitral stenosis.  She does have a follow-up echocardiogram scheduled next week and will be seen in follow-up by the valve clinic.  I will defer further work-up to them.  COVID-19 Education: The signs and symptoms of COVID-19 were discussed with the patient and how to seek care for testing (follow up with PCP or arrange E-visit).  The importance of social distancing was discussed today.  Patient Risk:   After full review of this patients clinical status,  I feel that they are at least moderate risk at this time.  Time:   Today, I have spent 25 minutes with the patient with telehealth technology discussing TAVR, DOE, hypertension.     Medication Adjustments/Labs and Tests Ordered: Current medicines are reviewed at length with the patient today.  Concerns regarding medicines are outlined above.   Tests Ordered: No orders of the defined types were placed in this encounter.   Medication Changes: Meds ordered  this encounter  Medications   metoprolol tartrate (LOPRESSOR) 50 MG tablet    Sig: Take 1 tablet (50 mg total) by mouth 2 (two) times daily.    Dispense:  180 tablet    Refill:  3    Disposition:  in 6 month(s)  Chrystie Nose, MD, [facility], FACP  [place]   [facility]  Medical Director of the [facility] Lipid [facility] &  Cardiovascular Risk Reduction Clinic Diplomate of the American Board of Clinical Lipidology Attending Cardiologist  Direct Dial: [phone]   Fax: [phone]  Website:  [email or web address]  Chrystie Nose, MD  01/08/2019 8:23 AM

## 2019-01-08 NOTE — Patient Instructions (Signed)
Medication Instructions:  INCREASE metoprolol tartrate to 50mg  twice daily If you need a refill on your cardiac medications before your next appointment, please call your pharmacy.   Lab work: NONE needed If you have labs (blood work) drawn today and your tests are completely normal, you will receive your results only by: Taylor Jenkins MyChart Message (if you have MyChart) OR . A paper copy in the mail If you have any lab test that is abnormal or we need to change your treatment, we will call you to review the results.  Testing/Procedures: NONE needed  Follow-Up: At [facility], you and your health needs are our priority.  As part of our continuing mission to provide you with exceptional heart care, we have created designated Provider Care Teams.  These Care Teams include your primary Cardiologist (physician) and Advanced Practice Providers (APPs -  Physician Assistants and Nurse Practitioners) who all work together to provide you with the care you need, when you need it. You will need a follow up appointment in 6 months.  Please call our office 2 months in advance to schedule this appointment.  You may see Dr. Rennis Golden or one of the following Advanced Practice Providers on your designated Care Team: Azalee Course, [place] . Micah Flesher, PA-C  Any Other Special Instructions Will Be Listed Below (If Applicable).

## 2019-01-08 NOTE — Progress Notes (Signed)
I have arranged for a CBC, BMET and BNP to be drawn before her echo on 5/14. I will see her on 5/15 for a virtual visit.

## 2019-01-12 ENCOUNTER — Telehealth (HOSPITAL_COMMUNITY): Payer: Self-pay

## 2019-01-12 NOTE — Telephone Encounter (Signed)

## 2019-01-12 NOTE — Telephone Encounter (Signed)
Error

## 2019-01-15 ENCOUNTER — Other Ambulatory Visit: Payer: Self-pay | Admitting: Physician Assistant

## 2019-01-15 ENCOUNTER — Other Ambulatory Visit: Payer: Self-pay

## 2019-01-15 ENCOUNTER — Ambulatory Visit (HOSPITAL_COMMUNITY): Payer: Medicare HMO | Attending: Physician Assistant

## 2019-01-15 ENCOUNTER — Ambulatory Visit: Payer: Medicare HMO | Admitting: Physician Assistant

## 2019-01-15 ENCOUNTER — Other Ambulatory Visit: Payer: Medicare HMO | Admitting: *Deleted

## 2019-01-15 DIAGNOSIS — R0602 Shortness of breath: Secondary | ICD-10-CM

## 2019-01-15 DIAGNOSIS — Z952 Presence of prosthetic heart valve: Secondary | ICD-10-CM | POA: Diagnosis not present

## 2019-01-15 MED ORDER — PERFLUTREN LIPID MICROSPHERE
1.0000 mL | INTRAVENOUS | Status: AC | PRN
  Administered 2019-01-15: 1 mL via INTRAVENOUS

## 2019-01-15 NOTE — Progress Notes (Signed)
[facility]   MULTIDISCIPLINARY HEART VALVE TEAM     Virtual Visit via Video Note   This visit type was conducted due to national recommendations for restrictions regarding the COVID-19 Pandemic (e.g. social distancing) in an effort to limit this patient's exposure and mitigate transmission in our community.  Due to her co-morbid illnesses, this patient is at least at moderate risk for complications without adequate follow up.  This format is felt to be most appropriate for this patient at this time.  All issues noted in this document were discussed and addressed.  A limited physical exam was performed with this format.  Please refer to the patient's chart for her consent to telehealth for [facility].   Evaluation Performed:  Follow-up visit  Date:  01/16/2019   ID:  Taylor Jenkins, DOB Dec 19, 1940, MRN [id number]  Patient Location: Home Provider Location: Office  PCP:  Rubie Maid, MD  Cardiologist: Dr. Debara Pickett Dr. Angelena Form & Dr. Cyndia Bent (TAVR) Electrophysiologist:  None   Chief Complaint: 1 month s/p TAVR  History of Present Illness:    Taylor Jenkins is a 78 y.o. female with a history of chronic diastolic CHF, DMT2, HTN, HLD, chronic atrial fib on Eliquis, iron deficiency anemia on iron infusions, and severe AS s/p TAVR (11/18/2018) who presents via audio/video conferencing for a telehealth visit today.    The patient does not have symptoms concerning for COVID-19 infection (fever, chills, cough, or new SHORTNESS OF BREATH).   Her aortic stenosis had been followed for several years in [facility] by Dr. Darral Dash.She said that she was having symptoms but nothing was being done.she was evaluated in [place] and was seen by Dr. Radford Pax. She was admitted to [facility] in August 2018 with marked anemia with a hemoglobin of 6.6, heme positive stools, and congestive heart failure. She did have an echocardiogram done here in August 2018 which showed severe aortic  stenosis with a mean gradient of 49 mmHg and a peak of 77 mmHg. Aortic valve area at that time was 0.65 cm. Left ventricular ejection fraction was normal. She was transfused and felt better. GI was consulted and endoscopic evaluation was recommended. She subsequently underwent outpatient upper endoscopy by Dr. Collene Mares which showed AVMs which were apparently clipped. She then went back for cardiology follow-up in [facility] and continued to have fatigue and exertional shortness of breath. She eventually self referred to Dr. Debara Pickett who she saw last month. A follow-up echocardiogram on 10/13/2018 showed severe aortic stenosis with a mean gradient of 58 mmHg and aortic valve area of 0.75 cm. There is moderate aortic insufficiency. Left ventricular ejection fraction is 60-65%.There is moderate left ventricular hypertrophy. She underwent cardiac catheterization on 10/30/2018 which showed mild to moderate nonobstructive coronary disease with about 30-40% mid LAD stenosis which was unchanged from prior catheterization in 2016. There is also about 40% ostial proximal left circumflex stenosis. The mean gradient across aortic valve was 43 mmHg with a peak to peak gradient of 65 mmHg and a valve area of 0.64 cm.Of note ,she was not on [facility] for afib given hx of AVMs with GI bleeding.  She underwent successful TAVR with a12m Edwards Sapien 3 THV via the TF approach on 11/18/18. Post op echo showed a normally functioning TAVR valve with mean gradient of 142mHg and mild-mod AI. She had evidence of mild volume overload and was diuresed. She was started on Eliquis given afib and high CHADSVASc score. She was also discharged on aspirin.  She was seen via telehealth by Dr. Debara Pickett on 01/08/19 and complained of dyspnea on exertion (when her heart rate became elevated). Dyspnea still improved from prior to TAVR but not felt to be back to normal. Bp was also noted to be elevated.  Her Lopressor was increased from 40m  --> 57mBID. There was also some question of worsening anemia given chronic anemia and new started of Eliquis.  She has BMET, CBC and BNP before her echo on 5/14 which showed normal creat and electrolytes, improved blood counts with Hg 12.8 and BNP pending. Echo 01/15/19 showed EF 60%, trivial pericardial effusion, normally functioning TAVR with mean gradient 17 mm Hg and no PVL, severe MAC with mild MS.   Today she presents via telemedicine for follow up. She still feels some shortness of breath mostly with moderate exertion. She hasn't noticed a big difference in her symptoms since increasing her Lopressor. She still has had big improvement since having her TAVR. No LE edema, orthopnea or PND. She has gained some true weight from having her niece cooking. BP has running a little high.  No dizziness or syncope. No blood in stool or urine.    Past Medical History:  Diagnosis Date   Anemia    Aortic atherosclerosis (HCFoster8/27/2018   Asthma    Atrial fibrillation, persistent    CAD (coronary artery disease) 2016   non-obstructive at cath   Chronic diastolic CHF (congestive heart failure) ([facility]   Diabetes mellitus without complication (HCC)    type 2   HLD (hyperlipidemia)    Hypertension    S/P TAVR (transcatheter aortic valve replacement)    Severe aortic stenosis 04/26/2017   Past Surgical History:  Procedure Laterality Date   LEFT HEART CATHETERIZATION WITH CORONARY ANGIOGRAM N/A 09/27/2014   Non-obstructive disease, CRF reduction recommended. Procedure: LEFT HEART CATHETERIZATION WITH CORONARY ANGIOGRAM;  Surgeon: MiBlane OharaMD;  [facility];  Service: Cardiovascular;  Laterality: N/A;   RIGHT/LEFT HEART CATH AND CORONARY ANGIOGRAPHY N/A 10/30/2018   Procedure: RIGHT/LEFT HEART CATH AND CORONARY ANGIOGRAPHY;  Surgeon: McBurnell BlanksMD;  [facility];  Service: Cardiovascular;  Laterality: N/A;   TEE WITHOUT CARDIOVERSION N/A  11/18/2018   Procedure: TRANSESOPHAGEAL ECHOCARDIOGRAM (TEE);  Surgeon: McBurnell BlanksMD;  [facility];  Service: Open Heart Surgery;  Laterality: N/A;   TRANSCATHETER AORTIC VALVE REPLACEMENT, TRANSFEMORAL N/A 11/18/2018   Procedure: TRANSCATHETER AORTIC VALVE REPLACEMENT, TRANSFEMORAL;  Surgeon: McBurnell BlanksMD;  [facility];  Service: Open Heart Surgery;  Laterality: N/A;   TUBAL LIGATION       Current Meds  Medication Sig   acetaminophen (TYLENOL) 500 MG tablet Take 500 mg by mouth every 6 (six) hours as needed for moderate pain or headache.   allopurinol (ZYLOPRIM) 100 MG tablet Take 1 tablet (100 mg total) by mouth daily.   apixaban (ELIQUIS) 5 MG TABS tablet Take 1 tablet (5 mg total) by mouth 2 (two) times daily.   aspirin EC 81 MG tablet Take 81 mg by mouth daily.   calcium carbonate (TUMS EX) 750 MG chewable tablet Chew 1 tablet by mouth daily as needed for heartburn.   clindamycin (CLEOCIN) 300 MG capsule Take 2 capsules (600 mg total) by mouth as directed. Take 2 capsules 1 hour prior to dental work including cleanings.   ferrous sulfate 325 (65 FE) MG tablet Take 325 mg by mouth daily.   insulin NPH  Human (HUMULIN N,NOVOLIN N) 100 UNIT/ML injection Inject 15 Units into the skin 2 (two) times daily.    levalbuterol (XOPENEX HFA) 45 MCG/ACT inhaler Inhale 2 puffs into the lungs every 4 (four) hours as needed for wheezing.   losartan (COZAAR) 50 MG tablet Take 1 tablet (50 mg total) by mouth daily.   metFORMIN (GLUCOPHAGE) 1000 MG tablet Take 1,000 mg by mouth 2 (two) times daily with a meal.   metoprolol tartrate (LOPRESSOR) 50 MG tablet Take 1 tablet (50 mg total) by mouth 2 (two) times daily.   nitroGLYCERIN (NITROSTAT) 0.4 MG SL tablet Place 1 tablet (0.4 mg total) under the tongue every 5 (five) minutes x 3 doses as needed for chest pain (or severe SOB).   Prenatal Vit-Fe Fumarate-FA (PRENATAL PO) Take 1  tablet by mouth daily.   torsemide (DEMADEX) 10 MG tablet Take 1 tablet (10 mg total) by mouth 2 (two) times daily.   [DISCONTINUED] losartan (COZAAR) 25 MG tablet Take 25 mg by mouth daily.   [DISCONTINUED] potassium chloride (MICRO-K) 10 MEQ CR capsule Take 2 capsules (20 mEq total) by mouth daily. (Patient taking differently: Take 10 mEq by mouth daily. )     Allergies:   Codeine; Epinephrine; Penicillins; Prednisone; Statins; Lidocaine; and Sulfa antibiotics   Social History   Tobacco Use   Smoking status: Never Smoker   Smokeless tobacco: Never Used  Substance Use Topics   Alcohol use: No    Alcohol/week: 0.0 standard drinks   Drug use: No     Family Hx: The patient's family history includes Heart attack (age of onset: 61) in her brother; Transient ischemic attack in her mother.  ROS:   Please see the history of present illness.    All other systems reviewed and are negative.   Prior CV studies:   The following studies were reviewed today:  TAVR OPERATIVE NOTE   Date of Procedure:11/18/2018  Preoperative Diagnosis:Severe Aortic Stenosis   Postoperative Diagnosis:Same   Procedure:   Transcatheter Aortic Valve Replacement - PercutaneousRightTransfemoral Approach Edwards Sapien 3 THV (size69m, model # 9600TFX, serial # [id number]  Co-Surgeons:Bryan KAlveria Apley MD and CLauree Chandler MD   Anesthesiologist:C. JGlennon Mac MD  EDala Dock MD  Pre-operative Echo Findings: ? Severe aortic stenosis ? Normalleft ventricular systolic function  Post-operative Echo Findings: ? Trivial to mildparavalvular leak ? Normalleft ventricular systolic function  _____________  Echo 11/19/18: IMPRESSIONS 1. The left ventricle has normal systolic function with an ejection fraction of 60-65%. The cavity size was normal. There  is mildly increased left ventricular wall thickness. Left ventricular diastolic function could not be evaluated secondary to atrial fibrillation. 2. The right ventricle has normal systolic function. The cavity was normal. There is no increase in right ventricular wall thickness. 3. Right atrial size was mildly dilated. 4. Severe thickening of the mitral valve leaflet. Moderate calcification of the mitral valve leaflet. There is moderate mitral annular calcification present. 5. Aortic valve regurgitation is mild to moderate by color flow Doppler. 6. There is a perivalvular leak around the TAVR which produces mild - moderate AI. 7. The interatrial septum was not assessed.  _______________  Echo 01/15/19 IMPRESSIONS   1. The left ventricle has normal systolic function with an ejection fraction of 60-65%. The cavity size was normal. There is moderately increased left ventricular wall thickness. Left ventricular diastolic function could not be evaluated secondary to  atrial fibrillation. Elevated mean left atrial pressure.  2. The right ventricle has normal systolic function. The cavity  was normal.  3. Left atrial size was severely dilated.  4. Trivial pericardial effusion is present.  5. The mitral valve is grossly normal. There is severe mitral annular calcification present. Mild mitral valve stenosis.  6. The tricuspid valve is grossly normal.  7. Normal LV systolic function; moderate LVH; s/p AVR with no AI and mean gradient 17 mmHg; severe MAC with mild MS (MVA by pressure half time 2.2 cm2 and trace MR; severe LAE.   Labs/Other Tests and Data Reviewed:    EKG:  No ECG reviewed.  Recent Labs: 11/17/2018: ALT 34 11/19/2018: Magnesium 2.1 01/15/2019: BNP WILL FOLLOW; BUN 31; Creatinine, Ser 0.93; Hemoglobin 12.8; Platelets 270; Potassium 4.8; Sodium 143   Recent Lipid Panel Lab Results  Component Value Date/Time   CHOL 219 (H) 09/26/2014 12:30 AM   TRIG 231 (H) 09/26/2014  12:30 AM   HDL 43 09/26/2014 12:30 AM   CHOLHDL 5.1 09/26/2014 12:30 AM   LDLCALC 130 (H) 09/26/2014 12:30 AM    Wt Readings from Last 3 Encounters:  01/16/19 205 lb (93 kg)  01/08/19 203 lb (92.1 kg)  12/03/18 198 lb (89.8 kg)     Objective:    Vital Signs:  BP (!) 150/91    Pulse 78    Wt 205 lb (93 kg)    BMI 44.36 kg/m    Well nourished, well developed female in acute distress.   ASSESSMENT & PLAN:    Severe AS s/p TAVR: doing well. Echo 01/15/19 showed EF 60%, trivial pericardial effusion, normally functioning TAVR with mean gradient 17 mm Hg and no PVL, severe MAC with mild MS. Mean gradient slightly more elevated than previous, but still in an acceptable range. She has had a big improvement in her breathing since TAVR, but still gets short of breath with moderate exertion when her HR elevates. She has not been able to tell a big difference after her Lopressor was increased last week. She has NYHA class II symptoms. I have asked her to enroll in cardiac rehab when the covid 19 restrictions have been lifted. She has clindamycin for SBE prophylaxis. I will see her back in 1 year with an echo.   Chronic diastolic CHF: weight has been stable. No LE edema. Continue Torsemide 23m BID.   Chronic atrial fibrillation: rate well controlled. Continue Eliquis. Blood counts stable.   HTN: Bp remains uncontrolled. Plan to increase losartan 50 mg daily. K 4.8 on most recent BMET. Plan to stop Kdur 10 MEQ daily and check BMET in 2 weeks. I will have follow up in the HTN clinic arranged to follow BPs.  Liver Lesion: The liver is cirrhotic, and there is a hypervascular area in segment 3 of the liver. Although this is favored to represent a perfusion anomaly, given the cirrhosis, further evaluation with nonemergent abdominal MRI with and without IV gadolinium is strongly recommended in the near future to better characterize this finding, as the possibility of hepatocellular carcinoma is not  excluded. She is followed by a GI doctor in [place]. She says she has a known hx of fatty liver disease. She would like to discuss findings with Dr to see if follow up is warranted. I have asked her to get back with me with the name so I can fax over the results.   COVID-19 Education: the signs and symptoms of COVID-19 were discussed with the patient and how to seek care for testing (follow up with PCP or arrange E-visit).  The importance of social  distancing was discussed today.  Time:   Today, I have spent 25 minutes with the patient with telehealth technology discussing the above problems.     Medication Adjustments/Labs and Tests Ordered: Current medicines are reviewed at length with the patient today.  Concerns regarding medicines are outlined above.   Tests Ordered: Orders Placed This Encounter  Procedures   Basic metabolic panel    Medication Changes: Meds ordered this encounter  Medications   losartan (COZAAR) 50 MG tablet    Sig: Take 1 tablet (50 mg total) by mouth daily.    Dispense:  90 tablet    Refill:  3    Order Specific Question:   Supervising Provider    Answer:   Burt Knack, MICHAEL [[id number]]    Disposition:  Follow up in year with structural clinic  Signed, Angelena Form, PA-C  01/16/2019 10:42 AM    [place]

## 2019-01-16 ENCOUNTER — Other Ambulatory Visit: Payer: Self-pay | Admitting: Physician Assistant

## 2019-01-16 ENCOUNTER — Telehealth (INDEPENDENT_AMBULATORY_CARE_PROVIDER_SITE_OTHER): Payer: Medicare HMO | Admitting: Physician Assistant

## 2019-01-16 VITALS — BP 150/91 | HR 78 | Wt 205.0 lb

## 2019-01-16 DIAGNOSIS — I4819 Other persistent atrial fibrillation: Secondary | ICD-10-CM

## 2019-01-16 DIAGNOSIS — I10 Essential (primary) hypertension: Secondary | ICD-10-CM

## 2019-01-16 DIAGNOSIS — Z7189 Other specified counseling: Secondary | ICD-10-CM

## 2019-01-16 DIAGNOSIS — I5032 Chronic diastolic (congestive) heart failure: Secondary | ICD-10-CM

## 2019-01-16 DIAGNOSIS — Z952 Presence of prosthetic heart valve: Secondary | ICD-10-CM

## 2019-01-16 LAB — BASIC METABOLIC PANEL
BUN/Creatinine Ratio: 33 — ABNORMAL HIGH (ref 12–28)
BUN: 31 mg/dL — ABNORMAL HIGH (ref 8–27)
CO2: 24 mmol/L (ref 20–29)
Calcium: 9.9 mg/dL (ref 8.7–10.3)
Chloride: 98 mmol/L (ref 96–106)
Creatinine, Ser: 0.93 mg/dL (ref 0.57–1.00)
GFR calc Af Amer: 69 mL/min/{1.73_m2} (ref >59)
GFR calc non Af Amer: 60 mL/min/{1.73_m2} (ref >59)
Glucose: 181 mg/dL — ABNORMAL HIGH (ref 65–99)
Potassium: 4.8 mmol/L (ref 3.5–5.2)
Sodium: 143 mmol/L (ref 134–144)

## 2019-01-16 LAB — CBC
Hematocrit: 41.2 % (ref 34.0–46.6)
Hemoglobin: 12.8 g/dL (ref 11.1–15.9)
MCH: 27.9 pg (ref 26.6–33.0)
MCHC: 31.1 g/dL — ABNORMAL LOW (ref 31.5–35.7)
MCV: 90 fL (ref 79–97)
Platelets: 270 10*3/uL (ref 150–450)
RBC: 4.58 x10E6/uL (ref 3.77–5.28)
RDW: 15.2 % (ref 11.7–15.4)
WBC: 9.9 10*3/uL (ref 3.4–10.8)

## 2019-01-16 LAB — BRAIN NATRIURETIC PEPTIDE: BNP: 60.1 pg/mL (ref 0.0–100.0)

## 2019-01-16 MED ORDER — LOSARTAN POTASSIUM 50 MG PO TABS: 50.0000 mg | ORAL_TABLET | Freq: Every day | ORAL | 3 refills | Status: DC

## 2019-01-16 NOTE — Patient Instructions (Addendum)
Hello Miss Flenner,   It was so nice to talk to you on the audio/video chat today. I am so glad you are doing so well. I just wanted to send you a recap of our discussion.   Please continue taking antibiotics prior to any dental work including cleanings (clindamycin). Please continue on all your same medications. You can STOP aspirin on 05/21/19. Please increase your Losartan to 50mg  daily. A new RX has been called into your pharmacy. Please stop you potassium supplementation. You will need lab work in 2 weeks. Please go to the closest [facility] on 5/19 for labs ( an order has been placed.) I would also like you to be seen in the hypertension clinic to follow your blood pressure over time. Someone will reach out to you to have this arranged. Lastly, please find out who your GI doctor is, so I can have them review the liver abnormality on your pre TAVR CT scans and see if follow up MRI is required.   I will see you back in 1 years time with an echo.   All appointment details are listed in upcoming appointments in MyChart or attached to this letter in your "after visit summary."  Please call us with any questions or concerns you may have and please stay safe during these uncertain times.  [name]

## 2019-02-03 ENCOUNTER — Telehealth: Payer: Self-pay | Admitting: Pharmacist

## 2019-02-03 DIAGNOSIS — I10 Essential (primary) hypertension: Secondary | ICD-10-CM

## 2019-02-03 NOTE — Telephone Encounter (Signed)
Called pt to follow up with BP since dose increase of losartan to 50mg  2 weeks ago. She will go to [facility] near her home in the next few days. Reports her BP has improved to 120s-130s systolic, however she does not have her cuff on her to go through readings at the moment. Will call pt and follow up with specific readings once BMET results.

## 2019-02-16 NOTE — Telephone Encounter (Signed)
Called pt and left a message since she has not had BMET drawn yet.

## 2019-02-18 NOTE — Telephone Encounter (Signed)
Left message again.

## 2019-02-19 ENCOUNTER — Telehealth: Payer: Self-pay | Admitting: Pharmacist

## 2019-02-19 DIAGNOSIS — I10 Essential (primary) hypertension: Secondary | ICD-10-CM

## 2019-02-19 LAB — BASIC METABOLIC PANEL
BUN/Creatinine Ratio: 38 — ABNORMAL HIGH (ref 12–28)
BUN: 46 mg/dL — ABNORMAL HIGH (ref 8–27)
CO2: 20 mmol/L (ref 20–29)
Calcium: 10.1 mg/dL (ref 8.7–10.3)
Chloride: 98 mmol/L (ref 96–106)
Creatinine, Ser: 1.21 mg/dL — ABNORMAL HIGH (ref 0.57–1.00)
GFR calc Af Amer: 50 mL/min/{1.73_m2} — ABNORMAL LOW (ref >59)
GFR calc non Af Amer: 44 mL/min/{1.73_m2} — ABNORMAL LOW (ref >59)
Glucose: 131 mg/dL — ABNORMAL HIGH (ref 65–99)
Potassium: 4.1 mmol/L (ref 3.5–5.2)
Sodium: 139 mmol/L (ref 134–144)

## 2019-02-19 NOTE — Telephone Encounter (Signed)
Called patient and LVM to return call. Calling to discuss lab results and blood pressure. Scr jumped 30% after increasing losartan from 25 to 50mg  daily. However BUN/scr ratio also elevated. Possible patient may been a little dehydrated for labs. Will discuss with patient and plan to repeat again in 1-2 weeks.

## 2019-02-23 NOTE — Telephone Encounter (Signed)
Patient returned call and left voicemail on our machine that her BP is 126/70 HR 88.   Attempted to call patient back to discuss labs. LVM for patient to return call.

## 2019-02-24 ENCOUNTER — Encounter: Payer: Self-pay | Admitting: Thoracic Surgery (Cardiothoracic Vascular Surgery)

## 2019-02-24 NOTE — Telephone Encounter (Signed)
Thank you :)

## 2019-02-24 NOTE — Telephone Encounter (Signed)
Patient returned call. Asked her about her fluid intake and swelling. States she doesn't have any swelling. Usually drinks about 48oz of water and a cup of coffee a day. Does admit she got her lab work done fasting because she didn't know if she was supposed to or not.  Blood pressure has been running good. This AM it was 119/79 HR 82. Has been staying in this range per patient. Will repeat BMP next week. Encouraged patient to drink water in the AM prior.

## 2019-03-10 ENCOUNTER — Telehealth: Payer: Self-pay | Admitting: Pharmacist

## 2019-03-10 NOTE — Telephone Encounter (Signed)
Left detailed message for patient reminding her of the need for repeat BMP

## 2019-03-23 ENCOUNTER — Other Ambulatory Visit: Payer: Self-pay | Admitting: *Deleted

## 2019-03-23 DIAGNOSIS — I96 Gangrene, not elsewhere classified: Secondary | ICD-10-CM

## 2019-03-23 DIAGNOSIS — I350 Nonrheumatic aortic (valve) stenosis: Secondary | ICD-10-CM

## 2019-03-24 ENCOUNTER — Encounter: Payer: Self-pay | Admitting: Vascular Surgery

## 2019-03-24 ENCOUNTER — Ambulatory Visit (HOSPITAL_COMMUNITY)
Admission: RE | Admit: 2019-03-24 | Discharge: 2019-03-24 | Disposition: A | Discharge status: Home / Self Care | Payer: Medicare HMO | Source: Ambulatory Visit | Attending: Vascular Surgery | Admitting: Vascular Surgery

## 2019-03-24 ENCOUNTER — Other Ambulatory Visit: Payer: Self-pay

## 2019-03-24 ENCOUNTER — Ambulatory Visit: Payer: Medicare HMO | Admitting: Vascular Surgery

## 2019-03-24 VITALS — BP 123/58 | HR 78 | Temp 97.4°F | Resp 18 | Ht <= 58 in | Wt 206.0 lb

## 2019-03-24 DIAGNOSIS — I96 Gangrene, not elsewhere classified: Secondary | ICD-10-CM | POA: Insufficient documentation

## 2019-03-24 DIAGNOSIS — I350 Nonrheumatic aortic (valve) stenosis: Secondary | ICD-10-CM | POA: Insufficient documentation

## 2019-03-24 NOTE — Progress Notes (Signed)
[facility]  Patient name: Taylor CheeksLinda Jenkins MRN: [id number] DOB: 11/09/1940 Sex: female  REASON FOR CONSULT: Evaluation ulceration tip of the right great toe  HPI: Taylor Jenkins is a 78 y.o. female, who is here today for evaluation of ulceration on the tip of her right great toe.  She reports an episode of striking this on a trunk at the foot of her bed and developed ulceration over the tip of her great toe.  Some lesser changes to the second toe.  She has no prior history of intermittent claudication or gangrenous changes to her feet.  She does have known aortic atherosclerosis and underwent transcatheter aortic valve replacement in August 2018.  Long history of diabetes and also history of congestive heart failure.  Her ulceration has demarcated and the eschar is lifted from her toe.  She has had no similar difficulties on the left foot.  She does report chronic swelling in both lower extremities with some skin changes associated with this.  Past Medical History:  Diagnosis Date  . Anemia   . Aortic atherosclerosis (HCC) 04/29/2017  . Asthma   . Atrial fibrillation, persistent   . CAD (coronary artery disease) 2016   non-obstructive at cath  . Chronic diastolic CHF (congestive heart failure) (HCC)   . Diabetes mellitus without complication (HCC)    type 2  . HLD (hyperlipidemia)   . Hypertension   . S/P TAVR (transcatheter aortic valve replacement)   . Severe aortic stenosis 04/26/2017    Family History  Problem Relation Age of Onset  . Transient ischemic attack Mother   . Heart attack Brother [id number]    SOCIAL HISTORY: Social History   Socioeconomic History  . Marital status: Widowed    Spouse name: Not on file  . Number of children: 4  . Years of education: Not on file  . Highest education level: Not on file  Occupational History  . Occupation: Retired-Secretary  Social Needs  . Financial resource strain: Not on  file  . Food insecurity    Worry: Not on file    Inability: Not on file  . Transportation needs    Medical: Not on file    Non-medical: Not on file  Tobacco Use  . Smoking status: Never Smoker  . Smokeless tobacco: Never Used  Substance and Sexual Activity  . Alcohol use: No    Alcohol/week: 0.0 standard drinks  . Drug use: No  . Sexual activity: Not on file  Lifestyle  . Physical activity    Days per week: Not on file    Minutes per session: Not on file  . Stress: Not on file  Relationships  . Social Musicianconnections    Talks on phone: Not on file    Gets together: Not on file    Attends religious service: Not on file    Active member of club or organization: Not on file    Attends meetings of clubs or organizations: Not on file    Relationship status: Not on file  . Intimate partner violence    Fear of current or ex partner: Not on file    Emotionally abused: Not on file    Physically abused: Not on file    Forced sexual activity: Not on file  Other Topics Concern  . Not on file  Social History Narrative   [place].     Allergies  Allergen Reactions  . Codeine Shortness Of Breath and  Itching  . Epinephrine Shortness Of Breath and Palpitations  . Penicillins Anaphylaxis    Did it involve swelling of the face/tongue/throat, SOB, or low BP? Yes Did it involve sudden or severe rash/hives, skin peeling, or any reaction on the inside of your mouth or nose? No Did you need to seek medical attention at a hospital or doctor's office? Yes When did it last happen?40+ years If all above answers are "NO", may proceed with cephalosporin use.   . Prednisone Other (See Comments)    psychosis   . Statins Other (See Comments)    Myalgias   . Lidocaine Palpitations  . Sulfa Antibiotics Rash    Current Outpatient Medications  Medication Sig Dispense Refill  . acetaminophen (TYLENOL) 500 MG tablet Take 500 mg by mouth every 6 (six) hours as needed for  moderate pain or headache.    . allopurinol (ZYLOPRIM) 100 MG tablet Take 1 tablet (100 mg total) by mouth daily. 90 tablet 1  . apixaban (ELIQUIS) 5 MG TABS tablet Take 1 tablet (5 mg total) by mouth 2 (two) times daily. 180 tablet 3  . aspirin EC 81 MG tablet Take 81 mg by mouth daily.    . calcium carbonate (TUMS EX) 750 MG chewable tablet Chew 1 tablet by mouth daily as needed for heartburn.    . clindamycin (CLEOCIN) 300 MG capsule Take 2 capsules (600 mg total) by mouth as directed. Take 2 capsules 1 hour prior to dental work including cleanings. 6 capsule 12  . ferrous sulfate 325 (65 FE) MG tablet Take 325 mg by mouth daily.    Marland Kitchen levalbuterol (XOPENEX HFA) 45 MCG/ACT inhaler Inhale 2 puffs into the lungs every 4 (four) hours as needed for wheezing. 1 Inhaler 2  . losartan (COZAAR) 50 MG tablet Take 1 tablet (50 mg total) by mouth daily. 90 tablet 3  . metFORMIN (GLUCOPHAGE) 1000 MG tablet Take 1,500 mg by mouth 2 (two) times daily with a meal.     . metoprolol tartrate (LOPRESSOR) 50 MG tablet Take 1 tablet (50 mg total) by mouth 2 (two) times daily. 180 tablet 3  . nitroGLYCERIN (NITROSTAT) 0.4 MG SL tablet Place 1 tablet (0.4 mg total) under the tongue every 5 (five) minutes x 3 doses as needed for chest pain (or severe SOB). 25 tablet 2  . Prenatal Vit-Fe Fumarate-FA (PRENATAL PO) Take 1 tablet by mouth daily.    Marland Kitchen torsemide (DEMADEX) 10 MG tablet Take 1 tablet (10 mg total) by mouth 2 (two) times daily. 180 tablet 3   No current facility-administered medications for this visit.     REVIEW OF SYSTEMS:  [X]  denotes positive finding, [ ]  denotes negative finding Cardiac  Comments:  Chest pain or chest pressure:    Shortness of breath upon exertion:    Short of breath when lying flat:    Irregular heart rhythm: x       Vascular    Pain in calf, thigh, or hip brought on by ambulation: x   Pain in feet at night that wakes you up from your sleep:  x   Blood clot in your veins:     Leg swelling:  x       Pulmonary    Oxygen at home:    Productive cough:     Wheezing:         Neurologic    Sudden weakness in arms or legs:     Sudden numbness in arms or legs:  Sudden onset of difficulty speaking or slurred speech:    Temporary loss of vision in one eye:     Problems with dizziness:  x       Gastrointestinal    Blood in stool:     Vomited blood:         Genitourinary    Burning when urinating:     Blood in urine:        Psychiatric    Major depression:         Hematologic    Bleeding problems:    Problems with blood clotting too easily:        Skin    Rashes or ulcers: x       Constitutional    Fever or chills:      PHYSICAL EXAM: Vitals:   03/24/19 0838  BP: (!) 123/58  Pulse: 78  Resp: 18  Temp: (!) 97.4 F (36.3 C)  TempSrc: Temporal  SpO2: 99%  Weight: 206 lb (93.4 kg)  Height: 4\' 9"  (1.448 m)    GENERAL: The patient is a well-nourished female, in no acute distress. The vital signs are documented above. CARDIOVASCULAR: 2+ radial pulses bilaterally.  Easily palpable 2+ popliteal pulses bilaterally.  On the left she has 2+ left dorsalis pedis pulse.  On the right I do not palpate a dorsalis pedis pulse.  I can palpate a 1+ posterior tibial pulse PULMONARY: There is good air exchange  ABDOMEN: Soft and non-tender  MUSCULOSKELETAL: There are no major deformities or cyanosis. NEUROLOGIC: No focal weakness or paresthesias are detected. SKIN: There are no ulcers or rashes noted.  She does have some peeling of the skin on her tip of her right great toe where she had struck this on trunk. PSYCHIATRIC: The patient has a normal affect.  DATA:  Noninvasive studies reveal triphasic waveform at the posterior tibial and dorsalis pedis bilaterally.  She has normal ankle arm index on the left and normal toe brachial index on the left.  She has noncompressible vessels on the right making ankle arm indices unreliable  MEDICAL ISSUES: I do not  feel that she has any evidence of severe arterial insufficiency and should be able to continue to heal her traumatic toe ulceration.  There is no evidence this is embolic.  He does have chronic swelling with a chronic edema in both lower extremities.  She has been able to wear compression garments in the past but was concerned regarding the swelling this caused around her knee and thigh.  I encouraged her to continue to wear knee-high compression as much as possible to reduce the fluid and reduce skin changes from the chronic edema that she is suffering.  She was relieved with this discussion will see [name] again on an as-needed basis   Rosetta Posner, MD [facility] Vascular and Vein Specialists of [facility] Tel [phone] Pager [phone]

## 2019-03-27 NOTE — Telephone Encounter (Signed)
Left another detailed message requesting patient go to [facility] to get BMP done

## 2019-10-02 ENCOUNTER — Ambulatory Visit: Payer: Medicare HMO | Admitting: Internal Medicine

## 2019-10-02 ENCOUNTER — Encounter: Payer: Self-pay | Admitting: Internal Medicine

## 2019-10-02 ENCOUNTER — Other Ambulatory Visit: Payer: Self-pay

## 2019-10-02 VITALS — BP 138/76 | HR 81 | Ht <= 58 in | Wt 217.0 lb

## 2019-10-02 DIAGNOSIS — R42 Dizziness and giddiness: Secondary | ICD-10-CM | POA: Diagnosis not present

## 2019-10-02 DIAGNOSIS — Z952 Presence of prosthetic heart valve: Secondary | ICD-10-CM | POA: Diagnosis not present

## 2019-10-02 DIAGNOSIS — R0602 Shortness of breath: Secondary | ICD-10-CM | POA: Diagnosis not present

## 2019-10-02 DIAGNOSIS — I4819 Other persistent atrial fibrillation: Secondary | ICD-10-CM

## 2019-10-02 NOTE — Progress Notes (Signed)
OFFICE CONSULT NOTE  Chief Complaint:  Follow-up  Primary Care Physician: Rubie Maid, MD  HPI:  Taylor Jenkins is a 79 y.o. female who is being seen today for the evaluation of establish cardiologist at the request of Rubie Maid, MD.  This is a pleasant 79 year old female who is here today to establish a new cardiologist.  She was previously followed by Dr. Darral Dash in [facility].  Past medical history significant for aortic stenosis, chronic atrial fibrillation, morbid obesity, mitral annular calcification, AVM with iron deficiency anemia and chronic blood loss, cardiomegaly, type 2 diabetes, hypertension and dyslipidemia.  Her 2 daughters are both nurses, 1 working at [employer] in [employer] the other is the Therapist, sports on [employer] at [employer].  Her last echocardiogram was in October 2018.  This demonstrated LVEF 70%, mild LVH, moderate MAC, trileaflet aortic valve with moderate to severe aortic stenosis and a mean gradient of 42 mmHg, and a severely dilated left atrium.  Despite this the office notes indicated moderate to severe left ear.  Symptomatically, however she has had worsening shortness of breath, significant fatigue and chest discomfort with exertion.  This is been progressively worse in fact has limited her activities significantly.  In addition she is struggled with anemia and recently thought this was the reason for her fatigue.  She however did have blood work which showed no significant iron deficiency at this point.  Because of small bowel AVMs and her anemia, apparently she has not been anticoagulated appropriately for her degree of atrial fibrillation.  Her CHADSVASC score is high, likely 4-5, indicating at least a 10% annual risk of stroke.  Finally, in addition to her fatigue, she reports poor sleep at night.  She had an Epworth sleepiness scale score of 18 which is significantly elevated.  Per her daughter she has a loud snoring, witnessed apnea, increased neck thickness,  morbid obesity and other risk factors for sleep apnea.  A sleep study is not yet been performed.  01/08/2019  Taylor Jenkins was seen today in follow-up regarding recent TAVR in March 2020.  She has a history of severe aortic stenosis and atrial fibrillation which is been longstanding persistent as well as diabetes, hypertension and dyslipidemia and chronic diastolic heart failure symptoms.  She underwent left heart catheterization prior to the procedure which showed mild to moderate nonobstructive coronary disease and ultimately underwent placement of a 23 mm Edwards Sapien 3 TAVR valve which was considered successful.  A repeat echo on November 19, 2018 showed normal LV function, a mean aortic valve gradient of 10 mmHg, and mild to moderate perivalvular leak.  She has been maintained on torsemide 10 mg twice daily and had initial weight loss down to 196 pounds but has crept up to 203 pounds and is stable at this weight.  She denies any orthopnea, lower extremity edema or associated symptoms, but does get short of breath with notable increase in heart rate doing activities such as housework or walking upstairs.  She was also noted to have some periprocedural anemia but did not require blood transfusion.  10/02/2019  Taylor Jenkins returns today for follow-up. She still has complaints of shortness of breath particularly with exertion. She fatigues easily, such as after vacuuming her house. She has to stop and rest periodically. She says she is having problems with some dizziness. She has several episodes during the day where she gets transient dizziness, lightheadedness and feelings of possible diplopia. She has been struggling with some sinus  congestion as well. Additionally, her daughter noted that she has been having some issues with vaginal dryness and asked if this could be related to her medications. Particular she has irritation which I have noted is not likely medication related but should be further evaluated by  her GYN. She is scheduled for repeat echo in March and follow-up in the valve clinic. She had been seen in the interim by Dr. Donnetta Hutching with vascular surgery for a toe ulcer which ultimately thought to be a diabetic ulcer. Arterial Dopplers were not significantly abnormal although she does have neuropathy and chronic stasis edema related to that.  PMHx:  Past Medical History:  Diagnosis Date  . Anemia   . Aortic atherosclerosis ([place]) 04/29/2017  . Asthma   . Atrial fibrillation, persistent ([place])   . CAD (coronary artery disease) 2016   non-obstructive at cath  . Chronic diastolic CHF (congestive heart failure) ([place])   . Diabetes mellitus without complication ([place])    type 2  . HLD (hyperlipidemia)   . Hypertension   . S/P TAVR (transcatheter aortic valve replacement)   . Severe aortic stenosis 04/26/2017    Past Surgical History:  Procedure Laterality Date  . LEFT HEART CATHETERIZATION WITH CORONARY ANGIOGRAM N/A 09/27/2014   Non-obstructive disease, CRF reduction recommended. Procedure: LEFT HEART CATHETERIZATION WITH CORONARY ANGIOGRAM;  Surgeon: Blane Ohara, MD;  Location: [facility] CATH LAB;  Service: Cardiovascular;  Laterality: N/A;  . RIGHT/LEFT HEART CATH AND CORONARY ANGIOGRAPHY N/A 10/30/2018   Procedure: RIGHT/LEFT HEART CATH AND CORONARY ANGIOGRAPHY;  Surgeon: Burnell Blanks, MD;  Location: [facility] CV LAB;  Service: Cardiovascular;  Laterality: N/A;  . TEE WITHOUT CARDIOVERSION N/A 11/18/2018   Procedure: TRANSESOPHAGEAL ECHOCARDIOGRAM (TEE);  Surgeon: Burnell Blanks, MD;  Location: [place] CV LAB;  Service: Open Heart Surgery;  Laterality: N/A;  . TRANSCATHETER AORTIC VALVE REPLACEMENT, TRANSFEMORAL N/A 11/18/2018   Procedure: TRANSCATHETER AORTIC VALVE REPLACEMENT, TRANSFEMORAL;  Surgeon: Burnell Blanks, MD;  Location: [place] CV LAB;  Service: Open Heart Surgery;  Laterality: N/A;  . TUBAL LIGATION      FAMHx:  Family History  Problem  Relation Age of Onset  . Transient ischemic attack Mother   . Heart attack Brother 58    SOCHx:   reports that she has never smoked. She has never used smokeless tobacco. She reports that she does not drink alcohol or use drugs.  ALLERGIES:  Allergies  Allergen Reactions  . Codeine Shortness Of Breath and Itching  . Epinephrine Shortness Of Breath and Palpitations  . Penicillins Anaphylaxis    Did it involve swelling of the face/tongue/throat, SOB, or low BP? Yes Did it involve sudden or severe rash/hives, skin peeling, or any reaction on the inside of your mouth or nose? No Did you need to seek medical attention at a hospital or doctor's office? Yes When did it last happen?40+ years If all above answers are "NO", may proceed with cephalosporin use.   . Prednisone Other (See Comments)    psychosis   . Statins Other (See Comments)    Myalgias   . Lidocaine Palpitations  . Sulfa Antibiotics Rash    ROS: Pertinent items noted in HPI and remainder of comprehensive ROS otherwise negative.  HOME MEDS: Current Outpatient Medications on File Prior to Visit  Medication Sig Dispense Refill  . acetaminophen (TYLENOL) 500 MG tablet Take 500 mg by mouth every 6 (six) hours as needed for moderate pain or headache.    . allopurinol (  ZYLOPRIM) 100 MG tablet Take 1 tablet (100 mg total) by mouth daily. 90 tablet 1  . apixaban (ELIQUIS) 5 MG TABS tablet Take 1 tablet (5 mg total) by mouth 2 (two) times daily. 180 tablet 3  . Ascorbic Acid (VITAMIN C) 1000 MG tablet Take 1,000 mg by mouth daily.    . calcium carbonate (TUMS EX) 750 MG chewable tablet Chew 1 tablet by mouth daily as needed for heartburn.    . ferrous sulfate 325 (65 FE) MG tablet Take 325 mg by mouth daily.    Marland Kitchen losartan (COZAAR) 50 MG tablet Take 1 tablet (50 mg total) by mouth daily. 90 tablet 3  . metFORMIN (GLUCOPHAGE) 1000 MG tablet Take 1,500 mg by mouth 2 (two) times daily with a meal.     . metoprolol tartrate  (LOPRESSOR) 50 MG tablet Take 1 tablet (50 mg total) by mouth 2 (two) times daily. 180 tablet 3  . nitroGLYCERIN (NITROSTAT) 0.4 MG SL tablet Place 1 tablet (0.4 mg total) under the tongue every 5 (five) minutes x 3 doses as needed for chest pain (or severe SOB). 25 tablet 2  . Prenatal Vit-Fe Fumarate-FA (PRENATAL PO) Take 1 tablet by mouth daily.    Marland Kitchen torsemide (DEMADEX) 10 MG tablet Take 1 tablet (10 mg total) by mouth 2 (two) times daily. 180 tablet 3  . zinc gluconate 50 MG tablet Take 50 mg by mouth daily.     No current facility-administered medications on file prior to visit.    LABS/IMAGING: No results found for this or any previous visit (from the past 48 hour(s)). No results found.  LIPID PANEL:    Component Value Date/Time   CHOL 219 (H) 09/26/2014 0030   TRIG 231 (H) 09/26/2014 0030   HDL 43 09/26/2014 0030   CHOLHDL 5.1 09/26/2014 0030   VLDL 46 (H) 09/26/2014 0030   LDLCALC 130 (H) 09/26/2014 0030    WEIGHTS: Wt Readings from Last 3 Encounters:  10/02/19 217 lb (98.4 kg)  03/24/19 206 lb (93.4 kg)  01/16/19 205 lb (93 kg)    VITALS: BP 138/76   Pulse 81   Ht _0  (1.448 m)   Wt 217 lb (98.4 kg)   SpO2 98%   BMI 46.96 kg/m   EXAM: General appearance: alert and no distress Neck: no carotid bruit, no JVD and thyroid not enlarged, symmetric, no tenderness/mass/nodules Lungs: clear to auscultation bilaterally Heart: regular rate and rhythm, S1: normal, S2: Absent and systolic murmur: systolic ejection 3/6, crescendo at 2nd right intercostal space Abdomen: soft, non-tender; bowel sounds normal; no masses,  no organomegaly and obese Extremities: extremities normal, atraumatic, no cyanosis or edema Pulses: 2+ and symmetric Skin: Skin color, texture, turgor normal. No rashes or lesions Neurologic: Grossly normal Psych: Pleasant  EKG: A. fib at 81-personally reviewed  ASSESSMENT: 1. Severe symptomatic aortic stenosis -status post 23 mm Edwards Safian 3  TAVR (11/2018), mild to moderate perivalvular leak 2. Longstanding persistent if not permanent atrial fibrillation 3. Severe left atrial enlargement 4. Morbid obesity 5. Type 2 diabetes 6. Hypertension 7. Hyperlipidemia 8. Family history of premature onset coronary disease 9. Probable obstructive sleep apnea 10. Dizziness 11. Vaginal dryness  PLAN: 1.   Taylor Jenkins seems to be doing well with regards to her aortic stenosis. She has had a stable TAVR valve on her last echo and is due for repeat in March. She had mild to moderate perivalvular leak. We'll check labs today to make sure there is  no outstanding heart failure as she has had some shortness of breath and fatigue with exertion. I think this is mostly due to her significant morbid obesity. She continues to have some peripheral edema which is most likely venous stasis and due to neuropathy. Her dizziness may have something to do with sinus congestion and I encouraged her to follow-up with an ENT. The vaginal dryness is deferred to her GYN. Follow-up with me in 6 months.  Pixie Casino, MD, [facility], [place] Director of the Advanced Lipid Disorders &  Cardiovascular Risk Reduction Clinic Diplomate of the American Board of Clinical Lipidology Attending Cardiologist  Direct Dial: [phone]  Fax: [phone]  Website:  [email or web address] 10/02/2019, 10:15 AM

## 2019-10-02 NOTE — Patient Instructions (Signed)
Medication Instructions:  Your physician recommends that you continue on your current medications as directed. Please refer to the Current Medication list given to you today.  *If you need a refill on your cardiac medications before your next appointment, please call your pharmacy*  Lab Work: LAB WORK TODAY - CBC, BMET, BNP If you have labs (blood work) drawn today and your tests are completely normal, you will receive your results only by: Marland Kitchen MyChart Message (if you have MyChart) OR . A paper copy in the mail If you have any lab test that is abnormal or we need to change your treatment, we will call you to review the results.  Testing/Procedures: NONE  Follow-Up: At [facility], you and your health needs are our priority.  As part of our continuing mission to provide you with exceptional heart care, we have created designated Provider Care Teams.  These Care Teams include your primary Cardiologist (physician) and Advanced Practice Providers (APPs -  Physician Assistants and Nurse Practitioners) who all work together to provide you with the care you need, when you need it.  Your next appointment:   6 month(s)  The format for your next appointment:   In Person  Provider:   You may see Dr. Rennis Golden or one of the following Advanced Practice Providers on your designated Care Team:    Azalee Course, PA-C  Micah Flesher, [place] or   Judy Pimple, [place]   Other Instructions

## 2019-10-03 LAB — CBC
Hematocrit: 42.7 % (ref 34.0–46.6)
Hemoglobin: 13.7 g/dL (ref 11.1–15.9)
MCH: 29 pg (ref 26.6–33.0)
MCHC: 32.1 g/dL (ref 31.5–35.7)
MCV: 90 fL (ref 79–97)
Platelets: 215 10*3/uL (ref 150–450)
RBC: 4.73 x10E6/uL (ref 3.77–5.28)
RDW: 14.9 % (ref 11.7–15.4)
WBC: 9.5 10*3/uL (ref 3.4–10.8)

## 2019-10-03 LAB — BRAIN NATRIURETIC PEPTIDE: BNP: 270.2 pg/mL — ABNORMAL HIGH (ref 0.0–100.0)

## 2019-10-03 LAB — BASIC METABOLIC PANEL
BUN/Creatinine Ratio: 27 (ref 12–28)
BUN: 36 mg/dL — ABNORMAL HIGH (ref 8–27)
CO2: 21 mmol/L (ref 20–29)
Calcium: 9.5 mg/dL (ref 8.7–10.3)
Chloride: 101 mmol/L (ref 96–106)
Creatinine, Ser: 1.34 mg/dL — ABNORMAL HIGH (ref 0.57–1.00)
GFR calc Af Amer: 44 mL/min/{1.73_m2} — ABNORMAL LOW (ref >59)
GFR calc non Af Amer: 38 mL/min/{1.73_m2} — ABNORMAL LOW (ref >59)
Glucose: 120 mg/dL — ABNORMAL HIGH (ref 65–99)
Potassium: 4.9 mmol/L (ref 3.5–5.2)
Sodium: 141 mmol/L (ref 134–144)

## 2019-10-07 ENCOUNTER — Other Ambulatory Visit: Payer: Self-pay | Admitting: Internal Medicine

## 2019-11-17 NOTE — Progress Notes (Addendum)
[facility] [name]                                       Cardiology Office Note    Date:  11/19/2019   ID:  Taylor Jenkins, DOB Jun 29, 1941, MRN [id number]  PCP:  Rubie Maid, MD  Cardiologist:  Dr. Debara Pickett / Dr. Angelena Form & Dr. Cyndia Bent (TAVR)  CC: 1 year s/p TAVR  History of Present Illness:  Taylor Jenkins is a 79 y.o. female with a history of chronic diastolic CHF, DMT2 with chronic toe ulcer, HTN, HLD, chronic atrial fibon Eliquis, iron deficiency anemia on iron infusions, and severe ASs/p TAVR (11/18/2018) who presents to clninic for follow up.   Her aortic stenosis had been followed for several years in [facility] by Dr. Darral Dash.She said that she was having symptoms but nothing was being done.She was evaluated in [place] and was seen by Dr. Radford Pax She was admitted to [facility] in August 2018 with marked anemia with a hemoglobin of 6.6, heme positive stools, and congestive heart failure. She had an echocardiogram done here in August 2018 which showed severe aortic stenosis with a mean gradient of 49 mmHg and a peak of 77 mmHg.Left ventricular ejection fraction was normal. She was transfused and felt better. GI was consulted and endoscopic evaluation was recommended. She subsequently underwent outpatient upper endoscopy by Dr. Collene Mares which showed AVMs which were apparently clipped. She then went back for cardiology follow-up in [facility] and continued to have fatigue and exertional shortness of breath. She eventually self referred herself to Dr. Debara Pickett.A follow-up echocardiogram on 10/13/2018 showed severe aortic stenosis with a mean gradient of 58 mmHg and aortic valve area of 0.75 cm. Left ventricular ejection fraction 60-65%.Cardiac catheterization on 10/30/2018 showed mild to moderate nonobstructive coronary disease with about 30-40% mid LAD stenosis which was unchanged from prior catheterization in 2016. There is also about 40%  ostial proximal left circumflex stenosis. Of note, she wasnot on OACfor afibgiven hx of AVMs with GI bleeding.  She underwentsuccessful TAVR with a34m Edwards Sapien 3 THV via the TF approach on 11/18/18.Post op echo showed a normally functioning TAVR valve with mean gradient of 182mHg and mild-mod AI. She had evidence of mild volume overload and was diuresed. She was started on Eliquis given afib and high CHADSVASc score. She was also discharged on aspirin.   Echo 01/15/19 showed EF 60%, trivial pericardial effusion, normally functioning TAVR with mean gradient 17 mm Hg and no PVL, severe MAC with mild MS.   She was seen by Dr. HiDebara Pickettast month and complained of fatigue and dyspnea. Follow up labs showed creat 1.34, K 4.9, BNP 270, H/H normal. Dr. HiDebara Pickettsked her to increase her Torsemide from 1043mID to 48m29mD.   Today she presents to clinic for follow up. Here with her daughter, CindJenny Reichmanne has gained 14 lbs of unexplained weight. She is having LE edema and orthopnea. No chest pain. No dizziness or syncope. No blood in stool or urine. She gets short of breath just walking across the room. Feels very fatigued. She also has not been taking her Torsemide 48mg64mnight (only taking 10mg)74m to having to urinate all night.    Past Medical History:  Diagnosis Date  . Anemia   . Aortic atherosclerosis (HCC) [place]/2018  . Asthma   . Atrial fibrillation,  persistent ([place])   . CAD (coronary artery disease) 2016   non-obstructive at cath  . Chronic diastolic CHF (congestive heart failure) ([place])   . Diabetes mellitus without complication ([place])    type 2  . HLD (hyperlipidemia)   . Hypertension   . S/P TAVR (transcatheter aortic valve replacement)   . Severe aortic stenosis 04/26/2017    Past Surgical History:  Procedure Laterality Date  . LEFT HEART CATHETERIZATION WITH CORONARY ANGIOGRAM N/A 09/27/2014   Non-obstructive disease, CRF reduction recommended. Procedure: LEFT HEART  CATHETERIZATION WITH CORONARY ANGIOGRAM;  Surgeon: Blane Ohara, MD;  Location: [facility] CATH LAB;  Service: Cardiovascular;  Laterality: N/A;  . RIGHT/LEFT HEART CATH AND CORONARY ANGIOGRAPHY N/A 10/30/2018   Procedure: RIGHT/LEFT HEART CATH AND CORONARY ANGIOGRAPHY;  Surgeon: Burnell Blanks, MD;  Location: [place] CV LAB;  Service: Cardiovascular;  Laterality: N/A;  . TEE WITHOUT CARDIOVERSION N/A 11/18/2018   Procedure: TRANSESOPHAGEAL ECHOCARDIOGRAM (TEE);  Surgeon: Burnell Blanks, MD;  Location: [place] CV LAB;  Service: Open Heart Surgery;  Laterality: N/A;  . TRANSCATHETER AORTIC VALVE REPLACEMENT, TRANSFEMORAL N/A 11/18/2018   Procedure: TRANSCATHETER AORTIC VALVE REPLACEMENT, TRANSFEMORAL;  Surgeon: Burnell Blanks, MD;  Location: [place] CV LAB;  Service: Open Heart Surgery;  Laterality: N/A;  . TUBAL LIGATION      Current Medications: Outpatient Medications Prior to Visit  Medication Sig Dispense Refill  . acetaminophen (TYLENOL) 500 MG tablet Take 500 mg by mouth every 6 (six) hours as needed for moderate pain or headache.    . allopurinol (ZYLOPRIM) 100 MG tablet Take 1 tablet (100 mg total) by mouth daily. 90 tablet 1  . apixaban (ELIQUIS) 5 MG TABS tablet Take 1 tablet (5 mg total) by mouth 2 (two) times daily. 180 tablet 3  . Ascorbic Acid (VITAMIN C) 1000 MG tablet Take 1,000 mg by mouth daily.    . calcium carbonate (TUMS EX) 750 MG chewable tablet Chew 1 tablet by mouth daily as needed for heartburn.    . ferrous sulfate 325 (65 FE) MG tablet Take 325 mg by mouth daily.    Marland Kitchen losartan (COZAAR) 50 MG tablet Take 1 tablet (50 mg total) by mouth daily. 90 tablet 3  . metFORMIN (GLUCOPHAGE) 1000 MG tablet Take 1,500 mg by mouth 2 (two) times daily with a meal.     . metoprolol tartrate (LOPRESSOR) 50 MG tablet Take 1 tablet (50 mg total) by mouth 2 (two) times daily. 180 tablet 3  . nitroGLYCERIN (NITROSTAT) 0.4 MG SL tablet Place 1 tablet (0.4 mg  total) under the tongue every 5 (five) minutes x 3 doses as needed for chest pain (or severe SOB). 25 tablet 2  . nystatin (MYCOSTATIN/NYSTOP) powder Apply 1 application topically once as needed (for skin iritation).    . Prenatal Vit-Fe Fumarate-FA (PRENATAL PO) Take 1 tablet by mouth daily.    . Vitamin A (CVS VITAMIN A) 2400 MCG (8000 UT) CAPS Take 1 capsule by mouth daily.    Marland Kitchen zinc gluconate 50 MG tablet Take 50 mg by mouth daily.    . potassium chloride (KLOR-CON) 10 MEQ tablet Take 10 mEq by mouth daily.    Marland Kitchen torsemide (DEMADEX) 10 MG tablet Take 20 mg by mouth 2 (two) times daily.     No facility-administered medications prior to visit.     Allergies:   Codeine, Epinephrine, Penicillins, Prednisone, Statins, Lidocaine, and Sulfa antibiotics   Social History   Socioeconomic History  . Marital  status: Widowed    Spouse name: Not on file  . Number of children: 4  . Years of education: Not on file  . Highest education level: Not on file  Occupational History  . Occupation: Retired-Secretary  Tobacco Use  . Smoking status: Never Smoker  . Smokeless tobacco: Never Used  Substance and Sexual Activity  . Alcohol use: No    Alcohol/week: 0.0 standard drinks  . Drug use: No  . Sexual activity: Not on file  Other Topics Concern  . Not on file  Social History Narrative   Pt lives alone in [place].    Social Determinants of Health   Financial Resource Strain:   . Difficulty of Paying Living Expenses:   Food Insecurity:   . Worried About Charity fundraiser in the Last Year:   . Arboriculturist in the Last Year:   Transportation Needs:   . Film/video editor (Medical):   Marland Kitchen Lack of Transportation (Non-Medical):   Physical Activity:   . Days of Exercise per Week:   . Minutes of Exercise per Session:   Stress:   . Feeling of Stress :   Social Connections:   . Frequency of Communication with Friends and Family:   . Frequency of Social Gatherings with Friends and  Family:   . Attends Religious Services:   . Active Member of Clubs or Organizations:   . Attends Archivist Meetings:   Marland Kitchen Marital Status:      Family History:  The patient's family history includes Heart attack (age of onset: 84) in her brother; Transient ischemic attack in her mother.     ROS:   Please see the history of present illness.    ROS All other systems reviewed and are negative.   PHYSICAL EXAM:   VS:  BP 124/78   Pulse 77   Ht _0  (1.448 m)   Wt 226 lb (102.5 kg)   SpO2 97%   BMI 48.91 kg/m    GEN: Well nourished, well developed, in no acute distress, obese,  HEENT: normal Neck: no JVD or masses Cardiac: irreg irreg; soft flow murmur. No rubs, or gallops, 1 + bilateral LE edema Respiratory:  clear to auscultation bilaterally, normal work of breathing GI: soft, nontender, nondistended, + BS MS: no deformity or atrophy Skin: warm and dry, no rash Neuro:  Alert and Oriented x 3, Strength and sensation are intact Psych: euthymic mood, full affect   Wt Readings from Last 3 Encounters:  11/18/19 226 lb (102.5 kg)  10/02/19 217 lb (98.4 kg)  03/24/19 206 lb (93.4 kg)      Studies/Labs Reviewed:   EKG:  EKG is NOT ordered today.   Recent Labs: 10/02/2019: BNP 270.2 11/18/2019: BUN 33; Creatinine, Ser 0.99; Hemoglobin 13.1; NT-Pro BNP 1,512; Platelets 205; Potassium 4.1; Sodium 140; TSH 2.170   Lipid Panel    Component Value Date/Time   CHOL 219 (H) 09/26/2014 0030   TRIG 231 (H) 09/26/2014 0030   HDL 43 09/26/2014 0030   CHOLHDL 5.1 09/26/2014 0030   VLDL 46 (H) 09/26/2014 0030   LDLCALC 130 (H) 09/26/2014 0030    Additional studies/ records that were reviewed today include:  TAVR OPERATIVE NOTE   Date of Procedure:11/18/2018  Preoperative Diagnosis:Severe Aortic Stenosis   Postoperative Diagnosis:Same   Procedure:   Transcatheter Aortic Valve Replacement - PercutaneousRightTransfemoral  Approach Edwards Sapien 3 THV (size18m, model # 9600TFX, serial # [id number]  Co-Surgeons:Bryan KAlveria Apley MD and  Lauree Chandler, MD   Anesthesiologist:C. Glennon Mac, MD  Dala Dock, MD  Pre-operative Echo Findings: ? Severe aortic stenosis ? Normalleft ventricular systolic function  Post-operative Echo Findings: ? Trivial to mildparavalvular leak ? Normalleft ventricular systolic function  _____________  Echo 11/19/18: IMPRESSIONS 1. The left ventricle has normal systolic function with an ejection fraction of 60-65%. The cavity size was normal. There is mildly increased left ventricular wall thickness. Left ventricular diastolic function could not be evaluated secondary to atrial fibrillation. 2. The right ventricle has normal systolic function. The cavity was normal. There is no increase in right ventricular wall thickness. 3. Right atrial size was mildly dilated. 4. Severe thickening of the mitral valve leaflet. Moderate calcification of the mitral valve leaflet. There is moderate mitral annular calcification present. 5. Aortic valve regurgitation is mild to moderate by color flow Doppler. 6. There is a perivalvular leak around the TAVR which produces mild - moderate AI. 7. The interatrial septum was not assessed.  _______________  Echo 01/15/19 IMPRESSIONS 1. The left ventricle has normal systolic function with an ejection fraction of 60-65%. The cavity size was normal. There is moderately increased left ventricular wall thickness. Left ventricular diastolic function could not be evaluated secondary to  atrial fibrillation. Elevated mean left atrial pressure. 2. The right ventricle has normal systolic function. The cavity was normal. 3. Left atrial size was severely dilated. 4. Trivial pericardial effusion is present. 5. The mitral valve is grossly  normal. There is severe mitral annular calcification present. Mild mitral valve stenosis. 6. The tricuspid valve is grossly normal. 7. Normal LV systolic function; moderate LVH; s/p AVR with no AI and mean gradient 17 mmHg; severe MAC with mild MS (MVA by pressure half time 2.2 cm2 and trace MR; severe LAE.  ______________  Echo 11/18/19 IMPRESSIONS  1. Left ventricular ejection fraction, by estimation, is 65 to 70%. The left ventricle has normal function. The left ventricle has no regional wall motion abnormalities. There is severe left ventricular hypertrophy. Left ventricular diastolic parameters are indeterminate.  2. The mitral valve is abnormal. Trivial mitral valve regurgitation. Mild mitral stenosis. The mean mitral valve gradient is 7.5 mmHg at a HR of 76.  3. The aortic valve has been repaired/replaced. Aortic valve regurgitation is trivial. There is a 23 mm Edwards Sapien prosthetic (TAVR) valve present in the aortic position. Procedure Date: 11/18/18. Echo findings are consistent with trivial  perivalvular leak of the aortic prosthesis. Aortic valve area, by VTI measures 2.08 cm. Aortic valve mean gradient measures 15.2 mmHg. Aortic valve Vmax measures 2.52 m/s. Peak gradient is 26 mmHg.  4. Right ventricular systolic function is normal. The right ventricular size is normal.  5. The inferior vena cava is dilated in size with >50% respiratory variability, suggesting right atrial pressure of 8 mmHg.  Comparison(s): 01/15/19 EF 60-65%. AV 91mHg mean, 356mg peak.  ASSESSMENT & PLAN:   Severe AS s/p TAVR:EF 65%, normally functioning TAVR with a mean gradient of 15.2 mm Hg and trivial PVL. She has NYHA class III symptoms, but I think she has acute volume overload currently. Continue on long term Eliquis for chronic afib.   Acute on chronic diastolic CHF: weight is up 14 lbs. Exam is difficult due to morbid obesity. Will check BMET and BNP. She also is not taking the correct dose of  Torsemide. Will increase it to 2019mID and have her take 12m66m days. Will increase Kdur to 10me74mily with an extra when she takes 12mg37m  of Torsemide. She will take her second dose of Torsemide around 2pm instead of 6pm to see if that helps with symptoms. With ongoing fatigue will check TSH and CBC at the daughter's request. Baseline weight is around 208 lbs.   Chronic atrial fibrillation: rate well controlled. Continue on Eliquis 5 mg BID  HTN:BP well controlled. Continue on current medications.    Medication Adjustments/Labs and Tests Ordered: Current medicines are reviewed at length with the patient today.  Concerns regarding medicines are outlined above.  Medication changes, Labs and Tests ordered today are listed in the Patient Instructions below. Patient Instructions  Medication Instructions:  1) INCREASE TORSEMIDE to 20 mg twice daily. For tomorrow and the next day ONLY, take Torsemide 40 mg in the AM and 20 mg in the PM. 2) For the 2 days you take Torsemide 40 mg in the morning, take an extra potassium (20 meq each day). Then when you decrease to Torsemide 20 mg twice daily on 3/20, decrease potassium back to 10 meq daily. *If you need a refill on your cardiac medications before your next appointment, please call your pharmacy*  Lab Work: TODAY: BMET, CBC, TSH, BNP If you have labs (blood work) drawn today and your tests are completely normal, you will receive your results only by: Marland Kitchen MyChart Message (if you have MyChart) OR . A paper copy in the mail If you have any lab test that is abnormal or we need to change your treatment, we will call you to review the results.  Follow-Up: At [facility], you and your health needs are our priority.  As part of our continuing mission to provide you with exceptional heart care, we have created designated Provider Care Teams.  These Care Teams include your primary Cardiologist (physician) and Advanced Practice Providers (APPs -  Physician  Assistants and Nurse Practitioners) who all work together to provide you with the care you need, when you need it. Your next appointment:   4 month(s) The format for your next appointment:   In Person Provider:   You may see Dr. Debara Pickett or one of the following Advanced Practice Providers on your designated Care Team:    Almyra Deforest, PA-C  Fabian Sharp, [place] or   Roby Lofts, PA-C      Signed, Angelena Form, [place]  11/19/2019 1:00 PM    [address], [place], [place]  [address] Phone: [phone]; Fax: [phone]

## 2019-11-18 ENCOUNTER — Ambulatory Visit: Payer: Medicare HMO | Admitting: Physician Assistant

## 2019-11-18 ENCOUNTER — Other Ambulatory Visit: Payer: Self-pay

## 2019-11-18 ENCOUNTER — Encounter: Payer: Self-pay | Admitting: Physician Assistant

## 2019-11-18 ENCOUNTER — Ambulatory Visit (HOSPITAL_COMMUNITY): Payer: Medicare HMO | Attending: Internal Medicine

## 2019-11-18 VITALS — BP 124/78 | HR 77 | Ht <= 58 in | Wt 226.0 lb

## 2019-11-18 DIAGNOSIS — Z952 Presence of prosthetic heart valve: Secondary | ICD-10-CM

## 2019-11-18 DIAGNOSIS — I5032 Chronic diastolic (congestive) heart failure: Secondary | ICD-10-CM | POA: Diagnosis not present

## 2019-11-18 DIAGNOSIS — K769 Liver disease, unspecified: Secondary | ICD-10-CM

## 2019-11-18 DIAGNOSIS — I10 Essential (primary) hypertension: Secondary | ICD-10-CM

## 2019-11-18 LAB — ECHOCARDIOGRAM COMPLETE: Height: 57 in

## 2019-11-18 MED ORDER — PERFLUTREN LIPID MICROSPHERE
1.0000 mL | INTRAVENOUS | Status: AC | PRN
  Administered 2019-11-18: 1 mL via INTRAVENOUS

## 2019-11-18 MED ORDER — POTASSIUM CHLORIDE CRYS ER 10 MEQ PO TBCR: 10.0000 meq | EXTENDED_RELEASE_TABLET | Freq: Every day | ORAL | 3 refills | Status: DC

## 2019-11-18 MED ORDER — TORSEMIDE 10 MG PO TABS: 20.0000 mg | ORAL_TABLET | Freq: Two times a day (BID) | ORAL | 3 refills | Status: DC

## 2019-11-18 NOTE — Patient Instructions (Addendum)
Medication Instructions:  1) INCREASE TORSEMIDE to 20 mg twice daily. For tomorrow and the next day ONLY, take Torsemide 40 mg in the AM and 20 mg in the PM. 2) For the 2 days you take Torsemide 40 mg in the morning, take an extra potassium (20 meq each day). Then when you decrease to Torsemide 20 mg twice daily on 3/20, decrease potassium back to 10 meq daily. *If you need a refill on your cardiac medications before your next appointment, please call your pharmacy*  Lab Work: TODAY: BMET, CBC, TSH, BNP If you have labs (blood work) drawn today and your tests are completely normal, you will receive your results only by: Marland Kitchen MyChart Message (if you have MyChart) OR . A paper copy in the mail If you have any lab test that is abnormal or we need to change your treatment, we will call you to review the results.  Follow-Up: At [facility], you and your health needs are our priority.  As part of our continuing mission to provide you with exceptional heart care, we have created designated Provider Care Teams.  These Care Teams include your primary Cardiologist (physician) and Advanced Practice Providers (APPs -  Physician Assistants and Nurse Practitioners) who all work together to provide you with the care you need, when you need it. Your next appointment:   4 month(s) The format for your next appointment:   In Person Provider:   You may see Dr. Rennis Golden or one of the following Advanced Practice Providers on your designated Care Team:    Azalee Course, PA-C  Micah Flesher, PA-C or   Judy Pimple, [place]

## 2019-11-19 ENCOUNTER — Other Ambulatory Visit: Payer: Self-pay | Admitting: Physician Assistant

## 2019-11-19 DIAGNOSIS — I5031 Acute diastolic (congestive) heart failure: Secondary | ICD-10-CM

## 2019-11-19 LAB — CBC WITH DIFFERENTIAL/PLATELET
Basophils Absolute: 0 10*3/uL (ref 0.0–0.2)
Basos: 0 %
EOS (ABSOLUTE): 0.4 10*3/uL (ref 0.0–0.4)
Eos: 4 %
Hematocrit: 41.3 % (ref 34.0–46.6)
Hemoglobin: 13.1 g/dL (ref 11.1–15.9)
Immature Grans (Abs): 0.1 10*3/uL (ref 0.0–0.1)
Immature Granulocytes: 1 %
Lymphocytes Absolute: 1.5 10*3/uL (ref 0.7–3.1)
Lymphs: 17 %
MCH: 28.4 pg (ref 26.6–33.0)
MCHC: 31.7 g/dL (ref 31.5–35.7)
MCV: 90 fL (ref 79–97)
Monocytes Absolute: 0.8 10*3/uL (ref 0.1–0.9)
Monocytes: 9 %
Neutrophils Absolute: 6.2 10*3/uL (ref 1.4–7.0)
Neutrophils: 69 %
Platelets: 205 10*3/uL (ref 150–450)
RBC: 4.61 x10E6/uL (ref 3.77–5.28)
RDW: 15.7 % — ABNORMAL HIGH (ref 11.7–15.4)
WBC: 8.9 10*3/uL (ref 3.4–10.8)

## 2019-11-19 LAB — BASIC METABOLIC PANEL
BUN/Creatinine Ratio: 33 — ABNORMAL HIGH (ref 12–28)
BUN: 33 mg/dL — ABNORMAL HIGH (ref 8–27)
CO2: 23 mmol/L (ref 20–29)
Calcium: 9.5 mg/dL (ref 8.7–10.3)
Chloride: 100 mmol/L (ref 96–106)
Creatinine, Ser: 0.99 mg/dL (ref 0.57–1.00)
GFR calc Af Amer: 64 mL/min/{1.73_m2} (ref >59)
GFR calc non Af Amer: 55 mL/min/{1.73_m2} — ABNORMAL LOW (ref >59)
Glucose: 182 mg/dL — ABNORMAL HIGH (ref 65–99)
Potassium: 4.1 mmol/L (ref 3.5–5.2)
Sodium: 140 mmol/L (ref 134–144)

## 2019-11-19 LAB — PRO B NATRIURETIC PEPTIDE: NT-Pro BNP: 1512 pg/mL — ABNORMAL HIGH (ref 0–738)

## 2019-11-19 LAB — TSH: TSH: 2.17 u[IU]/mL (ref 0.450–4.500)

## 2019-11-25 ENCOUNTER — Other Ambulatory Visit: Payer: Self-pay | Admitting: Physician Assistant

## 2019-11-25 ENCOUNTER — Other Ambulatory Visit (HOSPITAL_COMMUNITY): Payer: Medicare HMO

## 2019-11-25 NOTE — Telephone Encounter (Signed)
Eliquis 5mg  refill request received. Pt is 79 years old, weight-102.5kg, Crea-0.99 on 11/18/2019, Diagnosis-Afib, and last seen by 11/20/2019, PA on 11/18/2019. Dose is appropriate based on dosing criteria. Will send in refill to requested pharmacy.

## 2020-01-22 ENCOUNTER — Other Ambulatory Visit: Payer: Self-pay | Admitting: Internal Medicine

## 2020-01-24 ENCOUNTER — Other Ambulatory Visit: Payer: Self-pay | Admitting: Physician Assistant

## 2020-01-26 ENCOUNTER — Telehealth: Payer: Self-pay | Admitting: Internal Medicine

## 2020-01-26 NOTE — Telephone Encounter (Signed)
Pt c/o medication issue:  1. Name of Medication:  torsemide (DEMADEX) 10 MG tablet ELIQUIS 5 MG TABS tablet  2. How are you currently taking this medication (dosage and times per day)? Patient's daughter was not sure   3. Are you having a reaction (difficulty breathing--STAT)? no  4. What is your medication issue? Patient's daughter calling stating the patient has been having a ton of body aches since her torsemide dosage was changed. She states she believes the patient may be dehydrated. She states she is also having fatigue and weakness.

## 2020-01-26 NOTE — Telephone Encounter (Signed)
Spoke with pt daughter, she reports the patient, at last visit with [employer] pa, torsemide was changed and the patient is having a lot of bone pain and they are wondering if it is related. She reports she has had some pain but it has gotten worse since the increase. The patient is c/o feeling tired and fatigue. The patient had blood work Friday, results are available in my chart, and has an appointment to see hematology regarding the anemia. The patient has no edema or SOB and her weight is down from her previous. Her bp today was 117/70. They reached out to [facility] who referred them to dr hilty regarding this matter. Aware dr Rennis Golden is not in the office but will forward for his review and advise.

## 2020-01-28 NOTE — Telephone Encounter (Signed)
Drs. Myna Hidalgo or Pamelia Hoit  DR. H

## 2020-01-28 NOTE — Telephone Encounter (Signed)
Bone pain may be related to the anemia - I would continue current dose torsemide. Her recent BMET was normal, no signs of dehydration.  Dr Rexene Edison

## 2020-01-28 NOTE — Telephone Encounter (Signed)
Spoke with daughter and provided MD recommendation/advice. She voiced understanding.   Daughter is also requesting hematologist/oncologist recommendation - sent to MD

## 2020-01-28 NOTE — Telephone Encounter (Signed)
Daughter aware of recommendations per Dr. Rennis Golden

## 2020-02-09 ENCOUNTER — Telehealth: Payer: Self-pay | Admitting: Cardiology

## 2020-02-09 NOTE — Telephone Encounter (Signed)
     I went in pt's chartt to see if anybody had called .

## 2020-02-10 ENCOUNTER — Encounter: Payer: Self-pay | Admitting: Cardiology

## 2020-02-11 ENCOUNTER — Telehealth (INDEPENDENT_AMBULATORY_CARE_PROVIDER_SITE_OTHER): Payer: Medicare HMO | Admitting: Cardiology

## 2020-02-11 ENCOUNTER — Encounter: Payer: Self-pay | Admitting: Cardiology

## 2020-02-11 VITALS — BP 136/89 | HR 99 | Ht <= 58 in | Wt 219.0 lb

## 2020-02-11 DIAGNOSIS — I951 Orthostatic hypotension: Secondary | ICD-10-CM | POA: Insufficient documentation

## 2020-02-11 DIAGNOSIS — I2510 Atherosclerotic heart disease of native coronary artery without angina pectoris: Secondary | ICD-10-CM | POA: Diagnosis not present

## 2020-02-11 DIAGNOSIS — Z952 Presence of prosthetic heart valve: Secondary | ICD-10-CM

## 2020-02-11 DIAGNOSIS — I10 Essential (primary) hypertension: Secondary | ICD-10-CM

## 2020-02-11 DIAGNOSIS — I4819 Other persistent atrial fibrillation: Secondary | ICD-10-CM

## 2020-02-11 NOTE — Patient Instructions (Signed)
Medication Instructions:  Continue current medications  *If you need a refill on your cardiac medications before your next appointment, please call your pharmacy*   Lab Work: None Ordered   Testing/Procedures: None ordered   Follow-Up: At [facility], you and your health needs are our priority.  As part of our continuing mission to provide you with exceptional heart care, we have created designated Provider Care Teams.  These Care Teams include your primary Cardiologist (physician) and Advanced Practice Providers (APPs -  Physician Assistants and Nurse Practitioners) who all work together to provide you with the care you need, when you need it.  We recommend signing up for the patient portal called "MyChart".  Sign up information is provided on this After Visit Summary.  MyChart is used to connect with patients for Virtual Visits (Telemedicine).  Patients are able to view lab/test results, encounter notes, upcoming appointments, etc.  Non-urgent messages can be sent to your provider as well.   To learn more about what you can do with MyChart, go to [email or web address].    Your next appointment:   Tuesday June 15th @ 1:30 pm  The format for your next appointment:   In Person  Provider:   Rudi Coco

## 2020-02-11 NOTE — Progress Notes (Signed)
Virtual Visit via Telephone Note   This visit type was conducted due to national recommendations for restrictions regarding the COVID-19 Pandemic (e.g. social distancing) in an effort to limit this patient's exposure and mitigate transmission in our community.  Due to her co-morbid illnesses, this patient is at least at moderate risk for complications without adequate follow up.  This format is felt to be most appropriate for this patient at this time.  The patient did not have access to video technology/had technical difficulties with video requiring transitioning to audio format only (telephone).  All issues noted in this document were discussed and addressed.  No physical exam could be performed with this format.  Please refer to the patient's chart for her  consent to telehealth for [facility].   The patient was identified using 2 identifiers.  Date:  02/11/2020   ID:  Taylor Jenkins, DOB February 09, 1941, MRN [id number]  Patient Location: Home Provider Location: Home  PCP:  Tarri Fuller, MD  Cardiologist:  Dr Rennis Golden Electrophysiologist:  None   Evaluation Performed:  Follow-Up Visit  Chief Complaint:  Weakness, tachycardia  History of Present Illness:    Taylor Jenkins is a 79 y.o. female with aortic stenosis, status post TAVR in March 2020.  Other medical issues include hypertension with diastolic heart failure, insulin-dependent diabetes, chronic atrial fibrillation on Eliquis, and a history of chronic anemia followed by a hematologist in [facility], prior AVMs clipped in 2018, and nonobstructive coronary disease at catheterization February 2020.  Patient is followed in the TAVR clinic and by Dr. Rennis Golden.  She has had chronic atrial fibrillation which she has previously tolerated well.  She called the office and asked for this appointment because of increasing tachycardia and fatigue when up.  Since she requested this appointment she also saw her primary care provider.  Apparently her  blood pressure was low, 80/40.  He decreased her metoprolol to 25 mg a day.  The patient recently had a CBC with a hemoglobin of 12 on 01/28/2020.  She had been on diuretics which have been cut back to as needed.  The patient feels like she needs to have her atrial fibrillation addressed and wants to speak with Dr. Rennis Golden about the possibility of an ablation or other therapies.  The patient does not have symptoms concerning for COVID-19 infection (fever, chills, cough, or new shortness of breath).    Past Medical History:  Diagnosis Date  . Anemia   . Aortic atherosclerosis (HCC) 04/29/2017  . Asthma   . Atrial fibrillation, persistent (HCC)   . CAD (coronary artery disease) 2016   non-obstructive at cath  . Chronic diastolic CHF (congestive heart failure) (HCC)   . Diabetes mellitus without complication (HCC)    type 2  . HLD (hyperlipidemia)   . Hypertension   . S/P TAVR (transcatheter aortic valve replacement)   . Severe aortic stenosis 04/26/2017   Past Surgical History:  Procedure Laterality Date  . LEFT HEART CATHETERIZATION WITH CORONARY ANGIOGRAM N/A 09/27/2014   Non-obstructive disease, CRF reduction recommended. Procedure: LEFT HEART CATHETERIZATION WITH CORONARY ANGIOGRAM;  Surgeon: Micheline Chapman, MD;  Location: [facility] CATH LAB;  Service: Cardiovascular;  Laterality: N/A;  . RIGHT/LEFT HEART CATH AND CORONARY ANGIOGRAPHY N/A 10/30/2018   Procedure: RIGHT/LEFT HEART CATH AND CORONARY ANGIOGRAPHY;  Surgeon: Kathleene Hazel, MD;  Location: [facility] INVASIVE CV LAB;  Service: Cardiovascular;  Laterality: N/A;  . TEE WITHOUT CARDIOVERSION N/A 11/18/2018   Procedure: TRANSESOPHAGEAL ECHOCARDIOGRAM (TEE);  Surgeon: Verne Carrow  D, MD;  Location: [place] CV LAB;  Service: Open Heart Surgery;  Laterality: N/A;  . TRANSCATHETER AORTIC VALVE REPLACEMENT, TRANSFEMORAL N/A 11/18/2018   Procedure: TRANSCATHETER AORTIC VALVE REPLACEMENT, TRANSFEMORAL;  Surgeon: Burnell Blanks, MD;  Location: [place] CV LAB;  Service: Open Heart Surgery;  Laterality: N/A;  . TUBAL LIGATION       Current Meds  Medication Sig  . acetaminophen (TYLENOL) 500 MG tablet Take 500 mg by mouth every 6 (six) hours as needed for moderate pain or headache.  . allopurinol (ZYLOPRIM) 100 MG tablet Take 1 tablet (100 mg total) by mouth daily.  . Ascorbic Acid (VITAMIN C) 1000 MG tablet Take 1,000 mg by mouth daily.  . calcium carbonate (TUMS EX) 750 MG chewable tablet Chew 1 tablet by mouth daily as needed for heartburn.  Marland Kitchen ELIQUIS 5 MG TABS tablet TAKE 1 TABLET BY MOUTH TWICE A DAY  . ferrous sulfate 325 (65 FE) MG tablet Take 325 mg by mouth daily.  . furosemide (LASIX) 20 MG tablet Take 1 tablet by mouth as needed.  . metoprolol tartrate (LOPRESSOR) 50 MG tablet TAKE 1 TABLET BY MOUTH TWICE A DAY  . nitroGLYCERIN (NITROSTAT) 0.4 MG SL tablet Place 1 tablet (0.4 mg total) under the tongue every 5 (five) minutes x 3 doses as needed for chest pain (or severe SOB).  Marland Kitchen nystatin (MYCOSTATIN/NYSTOP) powder Apply 1 application topically once as needed (for skin iritation).  . potassium chloride (KLOR-CON) 10 MEQ tablet Take 1 tablet (10 mEq total) by mouth daily.  . Prenatal Vit-Fe Fumarate-FA (PRENATAL PO) Take 1 tablet by mouth daily.  . Vitamin A (CVS VITAMIN A) 2400 MCG (8000 UT) CAPS Take 1 capsule by mouth daily.  Marland Kitchen zinc gluconate 50 MG tablet Take 50 mg by mouth daily.     Allergies:   Codeine, Epinephrine, Penicillins, Prednisone, Statins, Lidocaine, and Sulfa antibiotics   Social History   Tobacco Use  . Smoking status: Never Smoker  . Smokeless tobacco: Never Used  Vaping Use  . Vaping Use: Never used  Substance Use Topics  . Alcohol use: No    Alcohol/week: 0.0 standard drinks  . Drug use: No     Family Hx: The patient's family history includes Heart attack (age of onset: 58) in her brother; Transient ischemic attack in her mother.  ROS:   Please see the history of  present illness.    All other systems reviewed and are negative.   Prior CV studies:   The following studies were reviewed today:  Echo 11/18/2019- IMPRESSIONS    1. Left ventricular ejection fraction, by estimation, is 65 to 70%. The  left ventricle has normal function. The left ventricle has no regional  wall motion abnormalities. There is severe left ventricular hypertrophy.  Left ventricular diastolic parameters  are indeterminate.  2. The mitral valve is abnormal. Trivial mitral valve regurgitation. Mild  mitral stenosis. The mean mitral valve gradient is 7.5 mmHg at a HR of 76.  3. The aortic valve has been repaired/replaced. Aortic valve  regurgitation is trivial. There is a 23 mm Edwards Sapien prosthetic  (TAVR) valve present in the aortic position. Procedure Date: 11/18/18. Echo  findings are consistent with trivial  perivalvular leak of the aortic prosthesis. Aortic valve area, by VTI  measures 2.08 cm. Aortic valve mean gradient measures 15.2 mmHg. Aortic  valve Vmax measures 2.52 m/s. Peak gradient is 26 mmHg.  4. Right ventricular systolic function is normal. The right  ventricular  size is normal.  5. The inferior vena cava is dilated in size with >50% respiratory  variability, suggesting right atrial pressure of 8 mmHg.   Labs/Other Tests and Data Reviewed:    EKG:  An ECG dated 10/02/2019 was personally reviewed today and demonstrated:  AF- HR 91  Recent Labs: 10/02/2019: BNP 270.2 11/18/2019: BUN 33; Creatinine, Ser 0.99; Hemoglobin 13.1; NT-Pro BNP 1,512; Platelets 205; Potassium 4.1; Sodium 140; TSH 2.170   Recent Lipid Panel Lab Results  Component Value Date/Time   CHOL 219 (H) 09/26/2014 12:30 AM   TRIG 231 (H) 09/26/2014 12:30 AM   HDL 43 09/26/2014 12:30 AM   CHOLHDL 5.1 09/26/2014 12:30 AM   LDLCALC 130 (H) 09/26/2014 12:30 AM    Wt Readings from Last 3 Encounters:  02/11/20 219 lb (99.3 kg)  11/18/19 226 lb (102.5 kg)  10/02/19 217 lb  (98.4 kg)     Objective:    Vital Signs:  BP 136/89   Pulse 99   Ht 4\' 9"  (1.448 m)   Wt 219 lb (99.3 kg)   BMI 47.39 kg/m    VITAL SIGNS:  reviewed  ASSESSMENT & PLAN:    Orthostatic hypotension- Her medications were cut back by her primary care provider.  I suspect she has tachycardia secondary to volume depletion, she may have been over diuresed.  She has had a history of AVMs in the past and is on Eliquis.  Recent hemoglobin done 527 was 12.1.  She has had no obvious bleeding.  She does have dark stools but she is on iron therapy.  Persistent atrial fibrillation- Previously rate  Controlled- she now wishes to discuss option for resumption on NSR.  TAVR- TAVR March 2020- echo March 2021 showed normal LVF and normal prosthetic valve function.  Chronic diastolic CHF- Diuretics added in March 2021- she may have been over diuresed.   Chronic anemia- Followed by Dr April 2021 in [facility]- she receives iron injections. Hgb was 12.1 01/28/2020  IDDM- Per PCP  Plan: She requested an office visit with Dr. 01/30/2020 and I will arrange this.  I told her it may take a couple days for her volume to equilibrate.  She may need further adjustments in her metoprolol therapy as it does.  COVID-19 Education: The signs and symptoms of COVID-19 were discussed with the patient and how to seek care for testing (follow up with PCP or arrange E-visit). The importance of social distancing was discussed today.  Time:   Today, I have spent 25 minutes with the patient with telehealth technology discussing the above problems.     Medication Adjustments/Labs and Tests Ordered: Current medicines are reviewed at length with the patient today.  Concerns regarding medicines are outlined above.   Tests Ordered: No orders of the defined types were placed in this encounter.   Medication Changes: No orders of the defined types were placed in this encounter.   Follow Up:  In Person with Dr  Rennis Golden  Signed, Rennis Golden, PA-C  02/11/2020 9:03 AM    [facility]

## 2020-02-16 ENCOUNTER — Other Ambulatory Visit: Payer: Self-pay

## 2020-02-16 ENCOUNTER — Ambulatory Visit (HOSPITAL_COMMUNITY)
Admission: RE | Admit: 2020-02-16 | Discharge: 2020-02-16 | Disposition: A | Discharge status: Home / Self Care | Payer: Medicare HMO | Source: Ambulatory Visit | Attending: Nurse Practitioner | Admitting: Nurse Practitioner

## 2020-02-16 VITALS — BP 126/62 | HR 74 | Ht <= 58 in | Wt 220.2 lb

## 2020-02-16 DIAGNOSIS — Z79899 Other long term (current) drug therapy: Secondary | ICD-10-CM | POA: Insufficient documentation

## 2020-02-16 DIAGNOSIS — E119 Type 2 diabetes mellitus without complications: Secondary | ICD-10-CM | POA: Insufficient documentation

## 2020-02-16 DIAGNOSIS — Z7901 Long term (current) use of anticoagulants: Secondary | ICD-10-CM | POA: Insufficient documentation

## 2020-02-16 DIAGNOSIS — I5032 Chronic diastolic (congestive) heart failure: Secondary | ICD-10-CM | POA: Diagnosis not present

## 2020-02-16 DIAGNOSIS — I2510 Atherosclerotic heart disease of native coronary artery without angina pectoris: Secondary | ICD-10-CM | POA: Insufficient documentation

## 2020-02-16 DIAGNOSIS — I4821 Permanent atrial fibrillation: Secondary | ICD-10-CM | POA: Diagnosis present

## 2020-02-16 DIAGNOSIS — I110 Hypertensive heart disease with heart failure: Secondary | ICD-10-CM | POA: Insufficient documentation

## 2020-02-16 DIAGNOSIS — D6869 Other thrombophilia: Secondary | ICD-10-CM

## 2020-02-16 DIAGNOSIS — E785 Hyperlipidemia, unspecified: Secondary | ICD-10-CM | POA: Diagnosis not present

## 2020-02-16 DIAGNOSIS — I4819 Other persistent atrial fibrillation: Secondary | ICD-10-CM

## 2020-02-16 DIAGNOSIS — Z794 Long term (current) use of insulin: Secondary | ICD-10-CM | POA: Insufficient documentation

## 2020-02-16 LAB — CBC
HCT: 34.6 % — ABNORMAL LOW (ref 36.0–46.0)
Hemoglobin: 10.1 g/dL — ABNORMAL LOW (ref 12.0–15.0)
MCH: 30.2 pg (ref 26.0–34.0)
MCHC: 29.2 g/dL — ABNORMAL LOW (ref 30.0–36.0)
MCV: 103.6 fL — ABNORMAL HIGH (ref 80.0–100.0)
Platelets: 160 10*3/uL (ref 150–400)
RBC: 3.34 MIL/uL — ABNORMAL LOW (ref 3.87–5.11)
RDW: 19.7 % — ABNORMAL HIGH (ref 11.5–15.5)
WBC: 7.4 10*3/uL (ref 4.0–10.5)
nRBC: 0 % (ref 0.0–0.2)

## 2020-02-16 LAB — BRAIN NATRIURETIC PEPTIDE: B Natriuretic Peptide: 225.3 pg/mL — ABNORMAL HIGH (ref 0.0–100.0)

## 2020-02-16 MED ORDER — METOPROLOL TARTRATE 25 MG PO TABS: 25.0000 mg | ORAL_TABLET | Freq: Two times a day (BID) | ORAL | Status: DC

## 2020-02-17 ENCOUNTER — Encounter (HOSPITAL_BASED_OUTPATIENT_CLINIC_OR_DEPARTMENT_OTHER): Payer: Self-pay | Admitting: Emergency Medicine

## 2020-02-17 ENCOUNTER — Emergency Department (HOSPITAL_BASED_OUTPATIENT_CLINIC_OR_DEPARTMENT_OTHER)
Admission: EM | Admit: 2020-02-17 | Discharge: 2020-02-17 | Disposition: A | Discharge status: Home / Self Care | Payer: Medicare HMO | Attending: Emergency Medicine | Admitting: Emergency Medicine

## 2020-02-17 ENCOUNTER — Emergency Department (HOSPITAL_BASED_OUTPATIENT_CLINIC_OR_DEPARTMENT_OTHER): Payer: Medicare HMO

## 2020-02-17 ENCOUNTER — Other Ambulatory Visit: Payer: Self-pay

## 2020-02-17 DIAGNOSIS — I5032 Chronic diastolic (congestive) heart failure: Secondary | ICD-10-CM | POA: Insufficient documentation

## 2020-02-17 DIAGNOSIS — E119 Type 2 diabetes mellitus without complications: Secondary | ICD-10-CM | POA: Insufficient documentation

## 2020-02-17 DIAGNOSIS — W010XXA Fall on same level from slipping, tripping and stumbling without subsequent striking against object, initial encounter: Secondary | ICD-10-CM | POA: Diagnosis not present

## 2020-02-17 DIAGNOSIS — Y999 Unspecified external cause status: Secondary | ICD-10-CM | POA: Diagnosis not present

## 2020-02-17 DIAGNOSIS — I110 Hypertensive heart disease with heart failure: Secondary | ICD-10-CM | POA: Diagnosis not present

## 2020-02-17 DIAGNOSIS — J45909 Unspecified asthma, uncomplicated: Secondary | ICD-10-CM | POA: Insufficient documentation

## 2020-02-17 DIAGNOSIS — S92334A Nondisplaced fracture of third metatarsal bone, right foot, initial encounter for closed fracture: Secondary | ICD-10-CM | POA: Diagnosis not present

## 2020-02-17 DIAGNOSIS — Z79899 Other long term (current) drug therapy: Secondary | ICD-10-CM | POA: Insufficient documentation

## 2020-02-17 DIAGNOSIS — S42291A Other displaced fracture of upper end of right humerus, initial encounter for closed fracture: Secondary | ICD-10-CM | POA: Insufficient documentation

## 2020-02-17 DIAGNOSIS — S92324A Nondisplaced fracture of second metatarsal bone, right foot, initial encounter for closed fracture: Secondary | ICD-10-CM | POA: Diagnosis not present

## 2020-02-17 DIAGNOSIS — Y92009 Unspecified place in unspecified non-institutional (private) residence as the place of occurrence of the external cause: Secondary | ICD-10-CM | POA: Diagnosis not present

## 2020-02-17 DIAGNOSIS — Y939 Activity, unspecified: Secondary | ICD-10-CM | POA: Insufficient documentation

## 2020-02-17 DIAGNOSIS — Z794 Long term (current) use of insulin: Secondary | ICD-10-CM | POA: Diagnosis not present

## 2020-02-17 DIAGNOSIS — I2510 Atherosclerotic heart disease of native coronary artery without angina pectoris: Secondary | ICD-10-CM | POA: Diagnosis not present

## 2020-02-17 MED ORDER — ONDANSETRON 4 MG PO TBDP
8.0000 mg | ORAL_TABLET | Freq: Once | ORAL | Status: AC
  Administered 2020-02-17: 8 mg via ORAL
  Filled 2020-02-17: qty 2

## 2020-02-17 MED ORDER — HYDROCODONE-ACETAMINOPHEN 5-325 MG PO TABS: 0.5000 | ORAL_TABLET | ORAL | 0 refills | Status: DC | PRN

## 2020-02-17 MED ORDER — HYDROCODONE-ACETAMINOPHEN 5-325 MG PO TABS
1.0000 | ORAL_TABLET | Freq: Once | ORAL | Status: AC
  Administered 2020-02-17: 1 via ORAL
  Filled 2020-02-17: qty 1

## 2020-02-17 MED ORDER — ONDANSETRON 8 MG PO TBDP: 8.0000 mg | ORAL_TABLET | Freq: Three times a day (TID) | ORAL | 1 refills | Status: DC | PRN

## 2020-02-17 NOTE — ED Provider Notes (Signed)
[facility] Provider Note: Lowella Dell, MD, FACEP  CSN: [id number] MRN: [id number] ARRIVAL: 02/17/20 at 0353 ROOM: MH09/MH09   CHIEF COMPLAINT  Fall   HISTORY OF PRESENT ILLNESS  02/17/20 4:20 AM Taylor Jenkins is a 79 y.o. female who tripped over some shoes that were on the floor at home yesterday evening about 10 PM.  She attempted to brace herself with an outstretched right arm but felt a pop in her right arm with subsequent pain.  She laid there for an hour before she could get up because she could not lift herself up with her right arm.  She rates her shoulder pain as a 10 out of 10, throbbing in nature and worse with movement.  She is having lesser pain in her right foot.  The pain in her right foot is located in her midfoot radiating to her toes.  There is some associated swelling and ecchymosis.  She denies any numbness or weakness in her right upper extremity distal to the shoulder.   Past Medical History:  Diagnosis Date  . Anemia   . Aortic atherosclerosis (HCC) 04/29/2017  . Asthma   . Atrial fibrillation, persistent (HCC)   . CAD (coronary artery disease) 2016   non-obstructive at cath  . Chronic diastolic CHF (congestive heart failure) (HCC)   . Diabetes mellitus without complication (HCC)    type 2  . HLD (hyperlipidemia)   . Hypertension   . S/P TAVR (transcatheter aortic valve replacement)   . Severe aortic stenosis 04/26/2017    Past Surgical History:  Procedure Laterality Date  . LEFT HEART CATHETERIZATION WITH CORONARY ANGIOGRAM N/A 09/27/2014   Non-obstructive disease, CRF reduction recommended. Procedure: LEFT HEART CATHETERIZATION WITH CORONARY ANGIOGRAM;  Surgeon: Micheline Chapman, MD;  Location[facility] CATH LAB;  Service: Cardiovascular;  Laterality: N/A;  . RIGHT/LEFT HEART CATH AND CORONARY ANGIOGRAPHY N/A 10/30/2018   Procedure: RIGHT/LEFT HEART CATH AND CORONARY ANGIOGRAPHY;  Surgeon: Kathleene Hazel, MD;  Location: [facility] INVASIVE CV  LAB;  Service: Cardiovascular;  Laterality: N/A;  . TEE WITHOUT CARDIOVERSION N/A 11/18/2018   Procedure: TRANSESOPHAGEAL ECHOCARDIOGRAM (TEE);  Surgeon: Kathleene Hazel, MD;  Location: [facility] INVASIVE CV LAB;  Service: Open Heart Surgery;  Laterality: N/A;  . TRANSCATHETER AORTIC VALVE REPLACEMENT, TRANSFEMORAL N/A 11/18/2018   Procedure: TRANSCATHETER AORTIC VALVE REPLACEMENT, TRANSFEMORAL;  Surgeon: Kathleene Hazel, MD;  Location: [facility] INVASIVE CV LAB;  Service: Open Heart Surgery;  Laterality: N/A;  . TUBAL LIGATION      Family History  Problem Relation Age of Onset  . Transient ischemic attack Mother   . Heart attack Brother 59    Social History   Tobacco Use  . Smoking status: Never Smoker  . Smokeless tobacco: Never Used  Vaping Use  . Vaping Use: Never used  Substance Use Topics  . Alcohol use: No    Alcohol/week: 0.0 standard drinks  . Drug use: No    Prior to Admission medications   Medication Sig Start Date End Date Taking? Authorizing Provider  allopurinol (ZYLOPRIM) 100 MG tablet Take 1 tablet (100 mg total) by mouth daily. 11/19/18   Janetta Hora, PA-C  Ascorbic Acid (VITAMIN C) 1000 MG tablet Take 1,000 mg by mouth daily.    [provider]  calcium carbonate (TUMS EX) 750 MG chewable tablet Chew 1 tablet by mouth as needed for heartburn.     [provider]  ELIQUIS 5 MG TABS tablet TAKE 1 TABLET BY MOUTH TWICE  A DAY 11/25/19   Janetta Hora, PA-C  ferrous sulfate 325 (65 FE) MG tablet Take 325 mg by mouth daily.    [provider]  furosemide (LASIX) 20 MG tablet Take 1-2 tablets by mouth daily as needed.  02/10/20   [provider]  HYDROcodone-acetaminophen (NORCO) 5-325 MG tablet Take 0.5 tablets by mouth every 4 (four) hours as needed for severe pain. 02/17/20   Talana Slatten, MD  insulin NPH Human (NOVOLIN N) 100 UNIT/ML injection Inject 50 Units into the skin in the morning and at bedtime.     [provider]  metoprolol tartrate (LOPRESSOR) 25 MG tablet Take 1 tablet (25 mg total) by mouth 2 (two) times daily. 02/16/20   Newman Nip, NP  Multiple Vitamin (MULTIVITAMIN ADULT PO) Take 1 tablet by mouth daily.    [provider]  nitroGLYCERIN (NITROSTAT) 0.4 MG SL tablet Place 1 tablet (0.4 mg total) under the tongue every 5 (five) minutes x 3 doses as needed for chest pain (or severe SOB). 09/28/14   Abelino Derrick, PA-C  nystatin (MYCOSTATIN/NYSTOP) powder Apply 1 application topically once as needed (for skin iritation).    [provider]  ondansetron (ZOFRAN ODT) 8 MG disintegrating tablet Take 1 tablet (8 mg total) by mouth every 8 (eight) hours as needed for nausea or vomiting. 02/17/20   Garlen Reinig, Jonny Ruiz, MD  potassium chloride (KLOR-CON) 10 MEQ tablet Take 1 tablet (10 mEq total) by mouth daily. 11/18/19 11/12/20  Janetta Hora, PA-C  zinc gluconate 50 MG tablet Take 50 mg by mouth daily.    [provider]    Allergies Codeine, Epinephrine, Penicillins, Prednisone, Statins, Lidocaine, and Sulfa antibiotics   REVIEW OF SYSTEMS  Negative except as noted here or in the History of Present Illness.   PHYSICAL EXAMINATION  Initial Vital Signs Blood pressure (!) 163/85, pulse 88, temperature 98.7 F (37.1 C), temperature source Oral, resp. rate (!) 22, height 4\' 9"  (1.448 m), weight 98 kg, SpO2 94 %.  Examination General: Well-developed, well-nourished female in no acute distress; appearance consistent with age of record HENT: normocephalic; atraumatic Eyes: pupils equal, round and reactive to light; extraocular muscles intact Neck: supple; nontender Heart: regular rate and rhythm Lungs: clear to auscultation bilaterally Abdomen: soft; nondistended; nontender; bowel sounds present Extremities: No deformity; pulses normal; tenderness of right upper arm near shoulder, decreased range of motion of shoulder due to pain, right upper extremity  distally neurovascularly intact; tenderness, swelling and ecchymosis of distal right foot Neurologic: Awake, alert and oriented; motor function intact in all extremities and symmetric; no facial droop Skin: Warm and dry; cyanosis of fingertips of right hand and right foot (patient states this is consistent with her history of Raynaud's syndrome) Psychiatric: Normal mood and affect   RESULTS  Summary of this visit's results, reviewed and interpreted by myself:   EKG Interpretation  Date/Time:    Ventricular Rate:    PR Interval:    QRS Duration:   QT Interval:    QTC Calculation:   R Axis:     Text Interpretation:        Laboratory Studies: Results for orders placed or performed during the hospital encounter of 02/16/20 (from the past 24 hour(s))  CBC     Status: Abnormal   Collection Time: 02/16/20  2:54 PM  Result Value Ref Range   WBC 7.4 4.0 - 10.5 K/uL   RBC 3.34 (L) 3.87 - 5.11 MIL/uL   Hemoglobin  10.1 (L) 12.0 - 15.0 g/dL   HCT 03.7 (L) 36 - 46 %   MCV 103.6 (H) 80.0 - 100.0 fL   MCH 30.2 26.0 - 34.0 pg   MCHC 29.2 (L) 30.0 - 36.0 g/dL   RDW 04.8 (H) 88.9 - 16.9 %   Platelets 160 150 - 400 K/uL   nRBC 0.0 0.0 - 0.2 %  Brain natriuretic peptide     Status: Abnormal   Collection Time: 02/16/20  2:54 PM  Result Value Ref Range   B Natriuretic Peptide 225.3 (H) 0.0 - 100.0 pg/mL   Imaging Studies: DG Shoulder Right  Result Date: 02/17/2020 CLINICAL DATA:  Fall and pain EXAM: RIGHT SHOULDER - 2+ VIEW COMPARISON:  None. FINDINGS: There is a comminuted nondisplaced fracture seen of the right surgical neck and bulging the greater tuberosity. The humeral head still articulates with the glenoid. There is diffuse osteopenia. Mild overlying soft tissue swelling present. IMPRESSION: Nondisplaced mildly impacted fractures of the proximal humerus. Electronically Signed   By: Jonna Clark M.D.   On: 02/17/2020 05:06   CT Foot Right Wo Contrast  Result Date: 02/17/2020 CLINICAL  DATA:  Fall, question of fracture on radiograph EXAM: CT OF THE RIGHT FOOT WITHOUT CONTRAST TECHNIQUE: Multidetector CT imaging of the right foot was performed according to the standard protocol. Multiplanar CT image reconstructions were also generated. COMPARISON:  None. FINDINGS: Bones/Joint/Cartilage There is diffuse osteopenia which somewhat limits the evaluation. There appears to be nondisplaced age-indeterminate fracture of the second and third metatarsal head neck junction. No intra-articular extension is seen. First MTP joint osteoarthritis is noted with joint space loss and subchondral cystic changes. There is midfoot osteoarthritis is cystic changes. A os navicular RI is present. Ligaments Suboptimally assessed by CT. Muscles and Tendons The muscles appear to be grossly intact. Flexor and extensor tendons intact. The plantar fascia and Achilles tendon intact. Soft tissues Significant dorsal soft tissue swelling is noted. Scattered vascular calcifications are noted. IMPRESSION: nondisplaced impacted fractures of the second and third metatarsal head neck junction Significant dorsal soft tissue swelling. Diffuse osteopenia. Electronically Signed   By: Jonna Clark M.D.   On: 02/17/2020 06:08   DG Foot Complete Right  Result Date: 02/17/2020 CLINICAL DATA:  Fall and pain EXAM: RIGHT FOOT COMPLETE - 3+ VIEW COMPARISON:  None. FINDINGS: There is a probable nondisplaced fracture seen through the second through fourth metatarsal head neck junction. Diffuse osteopenia somewhat limits evaluation. Diffuse dorsal soft tissue swelling is noted. First MTP joint osteoarthritis is noted with joint space loss and subchondral sclerosis. Scattered vascular calcifications are noted IMPRESSION: Probable nondisplaced fractures of the second through fourth metatarsal heads. Electronically Signed   By: Jonna Clark M.D.   On: 02/17/2020 05:05    ED COURSE and MDM  Nursing notes, initial and subsequent vitals signs,  including pulse oximetry, reviewed and interpreted by myself.  Vitals:   02/17/20 0403 02/17/20 0407 02/17/20 0615  BP:  (!) 163/85 (!) 133/59  Pulse:  88 83  Resp:  (!) 22 18  Temp:  98.7 F (37.1 C) 98.5 F (36.9 C)  TempSrc:  Oral Oral  SpO2:  94% 95%  Weight: 98 kg    Height: 4\' 9"  (1.448 m)     Medications  HYDROcodone-acetaminophen (NORCO/VICODIN) 5-325 MG per tablet 1 tablet (1 tablet Oral Given 02/17/20 0438)  ondansetron (ZOFRAN-ODT) disintegrating tablet 8 mg (8 mg Oral Given 02/17/20 0552)   Patient placed in right shoulder immobilizer and right postop  shoe.  Will refer to orthopedics as her shoulder fracture may require surgery.  The metatarsal fractures should heal with conservative treatment.  Patient already on MiraLAX and prune juice for constipation, advised of risk of worsening constipation with hydrocodone.   PROCEDURES  Procedures   ED DIAGNOSES     ICD-10-CM   1. Fall from slip, trip, or stumble, initial encounter  W01.0XXA   2. Fracture of humeral head, closed, right, initial encounter  S42.291A   3. Closed nondisplaced fracture of second metatarsal bone of right foot, initial encounter  S92.324A   4. Closed nondisplaced fracture of third metatarsal bone of right foot, initial encounter  G89.169I        Shanon Rosser, MD 02/17/20 [phone]

## 2020-02-17 NOTE — ED Notes (Signed)
Returned from radiology. 

## 2020-02-17 NOTE — ED Notes (Signed)
Transported to CT 

## 2020-02-17 NOTE — Progress Notes (Signed)
Primary Care Physician: Rubie Maid, MD Referring Physician:Dr.  Sol Odor is a 79 y.o. female with a h/o aortic stenosis, status post TAVR in March 2020.  Other medical issues include hypertension with diastolic heart failure, insulin-dependent diabetes, chronic atrial fibrillation on Eliquis, and a history of chronic anemia followed by a hematologist in [facility], prior AVMs clipped in 2018, and nonobstructive coronary disease at catheterization February 2020.  Pt is in the afib clinic as she in the last 4 weeks has noted more weakness, exertional shortness of breath. She wanted to be evaluated if this could be her afib. She  has lived in [place] since 2018. She is rate controlled. In this period of time, she was also found to be over diuresed and overmedicated with hypotension.. She  had a lot  of her meds stopped or reduced in dosage. Her hemoglobin has recently dropped to 10, with last hemoglobin around 12. She recently had an iron infusion. Ehr symptoms have improved since her meds have been adjusted.   Today, she denies symptoms of palpitations, chest pain,  orthopnea, PND, lower extremity edema, dizziness, presyncope, syncope, or neurologic sequela.+ fatigue and shortness of breath with exertion.  The patient is tolerating medications without difficulties and is otherwise without complaint today.   Past Medical History:  Diagnosis Date  . Anemia   . Aortic atherosclerosis ([place]) 04/29/2017  . Asthma   . Atrial fibrillation, persistent ([place])   . CAD (coronary artery disease) 2016   non-obstructive at cath  . Chronic diastolic CHF (congestive heart failure) ([place])   . Diabetes mellitus without complication ([place])    type 2  . HLD (hyperlipidemia)   . Hypertension   . S/P TAVR (transcatheter aortic valve replacement)   . Severe aortic stenosis 04/26/2017   Past Surgical History:  Procedure Laterality Date  . LEFT HEART CATHETERIZATION WITH CORONARY ANGIOGRAM N/A  09/27/2014   Non-obstructive disease, CRF reduction recommended. Procedure: LEFT HEART CATHETERIZATION WITH CORONARY ANGIOGRAM;  Surgeon: Blane Ohara, MD;  Location: [facility] CATH LAB;  Service: Cardiovascular;  Laterality: N/A;  . RIGHT/LEFT HEART CATH AND CORONARY ANGIOGRAPHY N/A 10/30/2018   Procedure: RIGHT/LEFT HEART CATH AND CORONARY ANGIOGRAPHY;  Surgeon: Burnell Blanks, MD;  Location: [place] CV LAB;  Service: Cardiovascular;  Laterality: N/A;  . TEE WITHOUT CARDIOVERSION N/A 11/18/2018   Procedure: TRANSESOPHAGEAL ECHOCARDIOGRAM (TEE);  Surgeon: Burnell Blanks, MD;  Location: [place] CV LAB;  Service: Open Heart Surgery;  Laterality: N/A;  . TRANSCATHETER AORTIC VALVE REPLACEMENT, TRANSFEMORAL N/A 11/18/2018   Procedure: TRANSCATHETER AORTIC VALVE REPLACEMENT, TRANSFEMORAL;  Surgeon: Burnell Blanks, MD;  Location: [place] CV LAB;  Service: Open Heart Surgery;  Laterality: N/A;  . TUBAL LIGATION      Current Outpatient Medications  Medication Sig Dispense Refill  . allopurinol (ZYLOPRIM) 100 MG tablet Take 1 tablet (100 mg total) by mouth daily. 90 tablet 1  . Ascorbic Acid (VITAMIN C) 1000 MG tablet Take 1,000 mg by mouth daily.    . calcium carbonate (TUMS EX) 750 MG chewable tablet Chew 1 tablet by mouth as needed for heartburn.     Marland Kitchen ELIQUIS 5 MG TABS tablet TAKE 1 TABLET BY MOUTH TWICE A DAY 60 tablet 10  . ferrous sulfate 325 (65 FE) MG tablet Take 325 mg by mouth daily.    . furosemide (LASIX) 20 MG tablet Take 1-2 tablets by mouth daily as needed.     . insulin NPH Human (NOVOLIN  N) 100 UNIT/ML injection Inject 50 Units into the skin in the morning and at bedtime.     . metoprolol tartrate (LOPRESSOR) 25 MG tablet Take 1 tablet (25 mg total) by mouth 2 (two) times daily.    . Multiple Vitamin (MULTIVITAMIN ADULT PO) Take 1 tablet by mouth daily.    . nitroGLYCERIN (NITROSTAT) 0.4 MG SL tablet Place 1 tablet (0.4 mg total) under the tongue every  5 (five) minutes x 3 doses as needed for chest pain (or severe SOB). 25 tablet 2  . nystatin (MYCOSTATIN/NYSTOP) powder Apply 1 application topically once as needed (for skin iritation).    . potassium chloride (KLOR-CON) 10 MEQ tablet Take 1 tablet (10 mEq total) by mouth daily. 90 tablet 3  . zinc gluconate 50 MG tablet Take 50 mg by mouth daily.    Marland Kitchen HYDROcodone-acetaminophen (NORCO) 5-325 MG tablet Take 0.5 tablets by mouth every 4 (four) hours as needed for severe pain. 15 tablet 0  . ondansetron (ZOFRAN ODT) 8 MG disintegrating tablet Take 1 tablet (8 mg total) by mouth every 8 (eight) hours as needed for nausea or vomiting. 10 tablet 1   No current facility-administered medications for this encounter.    Allergies  Allergen Reactions  . Codeine Shortness Of Breath and Itching  . Epinephrine Shortness Of Breath and Palpitations  . Penicillins Anaphylaxis    Did it involve swelling of the face/tongue/throat, SOB, or low BP? Yes Did it involve sudden or severe rash/hives, skin peeling, or any reaction on the inside of your mouth or nose? No Did you need to seek medical attention at a hospital or doctor's office? Yes When did it last happen?40+ years If all above answers are "NO", may proceed with cephalosporin use.   . Prednisone Other (See Comments)    psychosis   . Statins Other (See Comments)    Myalgias   . Lidocaine Palpitations  . Sulfa Antibiotics Rash    Social History   Socioeconomic History  . Marital status: Widowed    Spouse name: Not on file  . Number of children: 4  . Years of education: Not on file  . Highest education level: Not on file  Occupational History  . Occupation: Retired-Secretary  Tobacco Use  . Smoking status: Never Smoker  . Smokeless tobacco: Never Used  Vaping Use  . Vaping Use: Never used  Substance and Sexual Activity  . Alcohol use: No    Alcohol/week: 0.0 standard drinks  . Drug use: No  . Sexual activity: Not on file   Other Topics Concern  . Not on file  Social History Narrative   Pt lives alone in [place].    Social Determinants of Health   Financial Resource Strain:   . Difficulty of Paying Living Expenses:   Food Insecurity:   . Worried About Programme researcher, broadcasting/film/video in the Last Year:   . Barista in the Last Year:   Transportation Needs:   . Freight forwarder (Medical):   Marland Kitchen Lack of Transportation (Non-Medical):   Physical Activity:   . Days of Exercise per Week:   . Minutes of Exercise per Session:   Stress:   . Feeling of Stress :   Social Connections:   . Frequency of Communication with Friends and Family:   . Frequency of Social Gatherings with Friends and Family:   . Attends Religious Services:   . Active Member of Clubs or Organizations:   . Attends Club or  Organization Meetings:   Marland Kitchen Marital Status:   Intimate Partner Violence:   . Fear of Current or Ex-Partner:   . Emotionally Abused:   Marland Kitchen Physically Abused:   . Sexually Abused:     Family History  Problem Relation Age of Onset  . Transient ischemic attack Mother   . Heart attack Brother 47    ROS- All systems are reviewed and negative except as per the HPI above  Physical Exam: Vitals:   02/16/20 1341  BP: 126/62  Pulse: 74  Weight: 99.9 kg  Height: 4\' 9"  (1.448 m)   Wt Readings from Last 3 Encounters:  02/17/20 98 kg  02/16/20 99.9 kg  02/11/20 99.3 kg    Labs: Lab Results  Component Value Date   NA 140 11/18/2019   K 4.1 11/18/2019   CL 100 11/18/2019   CO2 23 11/18/2019   GLUCOSE 182 (H) 11/18/2019   BUN 33 (H) 11/18/2019   CREATININE 0.99 11/18/2019   CALCIUM 9.5 11/18/2019   MG 2.1 11/19/2018   Lab Results  Component Value Date   INR 1.1 11/17/2018   Lab Results  Component Value Date   CHOL 219 (H) 09/26/2014   HDL 43 09/26/2014   LDLCALC 130 (H) 09/26/2014   TRIG 231 (H) 09/26/2014     GEN- The patient is well appearing, alert and oriented x 3 today.   Head-  normocephalic, atraumatic Eyes-  Sclera clear, conjunctiva pink Ears- hearing intact Oropharynx- clear Neck- supple, no JVP Lymph- no cervical lymphadenopathy Lungs- Clear to ausculation bilaterally, normal work of breathing Heart- Regular rate and rhythm, no  rubs or gallops, PMI not laterally displaced, 2/6 systolic  murmur  GI- soft, NT, ND, + BS Extremities- no clubbing, cyanosis, or  Mild edema MS- no significant deformity or atrophy Skin- no rash or lesion Psych- euthymic mood, full affect Neuro- strength and sensation are intact  EKG-afib at 74 bpm    Assessment and Plan: 1. Permanent afib Present since 2018 She is rate controlled I feel her recent symptoms are from anemia and being over diuresed and over medicated Since her meds were adjusted, she has seen some improvement After living with afib for so long, I feel that it would be very difficult to return pt to SR She would not be an ablation candidate and I feel antiarrythmic's may not be effective Long discussion with the pt and daughter, who is an 2019 and knows her mother's health quite well They are both in agreement that they do not want to purse anything different with her afib and the daughter feels her recant issues are more responsible for her current symptoms which have improved over the last 1-2 weeks  She will continue metoprolol 25 mg bid   2. CHA2DS2VASc score of at least 7 Continue eliquis 5 mg bid Pt does have h/o AVM's with recent drop in H/H, is concerning  Daughter plans to reach out to her GI MD Daughter also reque sted repeat CBC with recent drop in H/H   3. CHF Continue  lasix Daughter request BNP with recent change from  torsemide to furosemide  4. TAVR Per structural heart    F/u with your usual providers as usual   Charity fundraiser C. Lupita Le[facility] [address] [place], [place] [phone]

## 2020-02-17 NOTE — ED Notes (Signed)
Pt transported to radiology.

## 2020-02-17 NOTE — ED Triage Notes (Signed)
Pt states she fell at 10pm tonight  States she tripped over her tennis shoe that was in the floor  Pt states she laid there for an hour before she could get up  Pt is c/o right shoulder and right foot pain

## 2020-02-19 ENCOUNTER — Encounter: Payer: Self-pay | Admitting: Orthopaedic Surgery

## 2020-02-19 ENCOUNTER — Ambulatory Visit: Payer: Medicare HMO | Admitting: Orthopaedic Surgery

## 2020-02-19 DIAGNOSIS — S42251A Displaced fracture of greater tuberosity of right humerus, initial encounter for closed fracture: Secondary | ICD-10-CM | POA: Insufficient documentation

## 2020-02-19 DIAGNOSIS — S92324A Nondisplaced fracture of second metatarsal bone, right foot, initial encounter for closed fracture: Secondary | ICD-10-CM | POA: Diagnosis not present

## 2020-02-19 MED ORDER — TRAMADOL HCL 50 MG PO TABS: 50.0000 mg | ORAL_TABLET | Freq: Every day | ORAL | 0 refills | Status: DC | PRN

## 2020-02-19 NOTE — Progress Notes (Addendum)
Office Visit Note   Patient: Taylor Jenkins           Date of Birth: [birth year]           MRN: [id number] Visit Date: 02/19/2020              Requested by: [place] [address] [place],  [place] [address] PCP: Rubie Maid, MD   Assessment & Plan: Visit Diagnoses:  1. Displaced fracture of greater tuberosity of right humerus, initial encounter for closed fracture   2. Nondisplaced fracture of second metatarsal bone, right foot, initial encounter for closed fracture     Plan: Impression is right greater tuberosity fracture and right second and third metatarsal fractures.  We will plan on treating this nonoperatively.  Sling for 3 weeks.  She will remove sling a couple times a day to move her elbow.  For the right foot we will place her in a short cam boot to help with the support.  Tramadol prescribed.  Recheck in 3 weeks with 2 view x-rays of the right shoulder  Follow-Up Instructions: Return in about 3 weeks (around 03/11/2020).   Orders:  No orders of the defined types were placed in this encounter.  Meds ordered this encounter  Medications  . traMADol (ULTRAM) 50 MG tablet    Sig: Take 1-2 tablets (50-100 mg total) by mouth daily as needed.    Dispense:  20 tablet    Refill:  0      Procedures: No procedures performed   Clinical Data: No additional findings.   Subjective: Chief Complaint  Patient presents with  . Right Shoulder - Injury, Pain  . Right Foot - Injury, Pain    Teia is a 79 year old female comes in as a ER follow-up from 3 days ago.  She had a mechanical fall at home and landed on her right shoulder and her right foot.  She has a right greater tuberosity fracture as well as nondisplaced second and third metatarsal fractures.   Review of Systems  Constitutional: Negative.   HENT: Negative.   Eyes: Negative.   Respiratory: Negative.   Cardiovascular: Negative.   Endocrine: Negative.   Musculoskeletal: Negative.     Neurological: Negative.   Hematological: Negative.   Psychiatric/Behavioral: Negative.   All other systems reviewed and are negative.    Objective: Vital Signs: There were no vitals taken for this visit.  Physical Exam Vitals and nursing note reviewed.  Constitutional:      Appearance: She is well-developed.  HENT:     Head: Normocephalic and atraumatic.  Pulmonary:     Effort: Pulmonary effort is normal.  Abdominal:     Palpations: Abdomen is soft.  Musculoskeletal:     Cervical back: Neck supple.  Skin:    General: Skin is warm.     Capillary Refill: Capillary refill takes less than 2 seconds.  Neurological:     Mental Status: She is alert and oriented to person, place, and time.  Psychiatric:        Behavior: Behavior normal.        Thought Content: Thought content normal.        Judgment: Judgment normal.     Ortho Exam Right shoulder shows tenderness of the greater tuberosity.  Neurovascular intact distally. Right foot shows moderate swelling with tenderness of the second and third metatarsals Patient is ambulatory Specialty Comments:  No specialty comments available.  Imaging: No results found.   PMFS History: Patient Active  Problem List   Diagnosis Date Noted  . Nondisplaced fracture of second metatarsal bone, right foot, initial encounter for closed fracture 02/19/2020  . Displaced fracture of greater tuberosity of right humerus, initial encounter for closed fracture 02/19/2020  . Orthostatic hypotension 02/11/2020  . S/P TAVR (transcatheter aortic valve replacement) 11/18/2018  . Atrial fibrillation, persistent ([facility])   . Severe aortic stenosis   . Obesity (BMI 30-39.9) 04/29/2017  . Symptomatic anemia 04/25/2017  . Melena 04/24/2017  . Right knee injury 08/03/2015  . CAD- mild, non obstructive by cath 09/27/14 09/28/2014  . Statin intolerance 09/28/2014  . Demand ischemia ([facility]) 09/25/2014  . Chronic diastolic (congestive) heart failure ([facility])  09/25/2014  . Non-insulin dependent type 2 diabetes mellitus ([facility]) 09/25/2014  . HTN (hypertension) 09/25/2014  . HLD (hyperlipidemia) 09/25/2014   Past Medical History:  Diagnosis Date  . Anemia   . Aortic atherosclerosis ([facility]) 04/29/2017  . Asthma   . Atrial fibrillation, persistent ([facility])   . CAD (coronary artery disease) 2016   non-obstructive at cath  . Chronic diastolic CHF (congestive heart failure) ([facility])   . Diabetes mellitus without complication ([facility])    type 2  . HLD (hyperlipidemia)   . Hypertension   . S/P TAVR (transcatheter aortic valve replacement)   . Severe aortic stenosis 04/26/2017    Family History  Problem Relation Age of Onset  . Transient ischemic attack Mother   . Heart attack Brother 80    Past Surgical History:  Procedure Laterality Date  . LEFT HEART CATHETERIZATION WITH CORONARY ANGIOGRAM N/A 09/27/2014   Non-obstructive disease, CRF reduction recommended. Procedure: LEFT HEART CATHETERIZATION WITH CORONARY ANGIOGRAM;  Surgeon: Micheline Chapman, MD;  Location: [facility] CATH LAB;  Service: Cardiovascular;  Laterality: N/A;  . RIGHT/LEFT HEART CATH AND CORONARY ANGIOGRAPHY N/A 10/30/2018   Procedure: RIGHT/LEFT HEART CATH AND CORONARY ANGIOGRAPHY;  Surgeon: Kathleene Hazel, MD;  Location: [facility] INVASIVE CV LAB;  Service: Cardiovascular;  Laterality: N/A;  . TEE WITHOUT CARDIOVERSION N/A 11/18/2018   Procedure: TRANSESOPHAGEAL ECHOCARDIOGRAM (TEE);  Surgeon: Kathleene Hazel, MD;  Location: [facility] INVASIVE CV LAB;  Service: Open Heart Surgery;  Laterality: N/A;  . TRANSCATHETER AORTIC VALVE REPLACEMENT, TRANSFEMORAL N/A 11/18/2018   Procedure: TRANSCATHETER AORTIC VALVE REPLACEMENT, TRANSFEMORAL;  Surgeon: Kathleene Hazel, MD;  Location: [facility] INVASIVE CV LAB;  Service: Open Heart Surgery;  Laterality: N/A;  . TUBAL LIGATION     Social History   Occupational History  . Occupation: Retired-Secretary  Tobacco Use  . Smoking status: Never Smoker  .  Smokeless tobacco: Never Used  Vaping Use  . Vaping Use: Never used  Substance and Sexual Activity  . Alcohol use: No    Alcohol/week: 0.0 standard drinks  . Drug use: No  . Sexual activity: Not on file

## 2020-03-11 ENCOUNTER — Ambulatory Visit: Payer: Medicare HMO | Admitting: Orthopaedic Surgery

## 2020-03-16 ENCOUNTER — Ambulatory Visit (INDEPENDENT_AMBULATORY_CARE_PROVIDER_SITE_OTHER): Payer: Medicare HMO

## 2020-03-16 ENCOUNTER — Encounter: Payer: Self-pay | Admitting: Orthopaedic Surgery

## 2020-03-16 ENCOUNTER — Ambulatory Visit: Payer: Medicare HMO | Admitting: Orthopaedic Surgery

## 2020-03-16 VITALS — Ht 58.5 in | Wt 214.8 lb

## 2020-03-16 DIAGNOSIS — S42251A Displaced fracture of greater tuberosity of right humerus, initial encounter for closed fracture: Secondary | ICD-10-CM | POA: Diagnosis not present

## 2020-03-16 DIAGNOSIS — S92324A Nondisplaced fracture of second metatarsal bone, right foot, initial encounter for closed fracture: Secondary | ICD-10-CM

## 2020-03-16 NOTE — Progress Notes (Signed)
Office Visit Note   Patient: Taylor Jenkins           Date of Birth: 11-29-1940           MRN: [id number] Visit Date: 03/16/2020              Requested by: [place] [address] [place],  [place] [address] PCP: Tarri Fuller, MD   Assessment & Plan: Visit Diagnoses:  1. Displaced fracture of greater tuberosity of right humerus, initial encounter for closed fracture   2. Nondisplaced fracture of second metatarsal bone, right foot, initial encounter for closed fracture     Plan: Impression is stable right proximal humerus fracture and right metatarsal fractures.  She may continue wearing crocs and activity as tolerated.  For the proximal humerus I would like her to get a session of home OT to learn home exercises and to begin some gentle range of motion.  She may take out the sling at home as her symptoms allow.  Recheck in 3 weeks with AP and scapular Y x-rays of the right shoulder and three-view x-rays of the right foot.  Follow-Up Instructions: Return in about 3 weeks (around 04/06/2020).   Orders:  Orders Placed This Encounter  Procedures  . XR Shoulder Right  . XR Foot Complete Right   No orders of the defined types were placed in this encounter.     Procedures: No procedures performed   Clinical Data: No additional findings.   Subjective: Chief Complaint  Patient presents with  . Right Shoulder - Pain  . Right Foot - Pain    Taylor Jenkins follows up today for her metatarsal fractures and right proximal humerus fracture.  She is overall doing well.  She is doing some light exercises in the sling.   Review of Systems   Objective: Vital Signs: Ht 4' 10.5" (1.486 m)   Wt 214 lb 12.8 oz (97.4 kg)   BMI 44.13 kg/m   Physical Exam  Ortho Exam The right foot is nonswollen and nontender.  She is wearing crocs at this point without any problems with ambulation. Right shoulder is slightly tender to palpation.  Range of motion is actually quite good  for 3 weeks. Specialty Comments:  No specialty comments available.  Imaging: XR Foot Complete Right  Result Date: 03/16/2020 Stable metatarsal fractures with callus formation.  XR Shoulder Right  Result Date: 03/16/2020 Stable proximal humerus fracture.  No worsening.    PMFS History: Patient Active Problem List   Diagnosis Date Noted  . Nondisplaced fracture of second metatarsal bone, right foot, initial encounter for closed fracture 02/19/2020  . Displaced fracture of greater tuberosity of right humerus, initial encounter for closed fracture 02/19/2020  . Orthostatic hypotension 02/11/2020  . S/P TAVR (transcatheter aortic valve replacement) 11/18/2018  . Atrial fibrillation, persistent ([facility])   . Severe aortic stenosis   . Obesity (BMI 30-39.9) 04/29/2017  . Symptomatic anemia 04/25/2017  . Melena 04/24/2017  . Right knee injury 08/03/2015  . CAD- mild, non obstructive by cath 09/27/14 09/28/2014  . Statin intolerance 09/28/2014  . Demand ischemia (HCC) 09/25/2014  . Chronic diastolic (congestive) heart failure ([facility]) 09/25/2014  . Non-insulin dependent type 2 diabetes mellitus ([facility]) 09/25/2014  . HTN (hypertension) 09/25/2014  . HLD (hyperlipidemia) 09/25/2014   Past Medical History:  Diagnosis Date  . Anemia   . Aortic atherosclerosis ([facility]) 04/29/2017  . Asthma   . Atrial fibrillation, persistent ([facility])   . CAD (coronary artery disease) 2016  non-obstructive at cath  . Chronic diastolic CHF (congestive heart failure) ([facility])   . Diabetes mellitus without complication ([facility])    type 2  . HLD (hyperlipidemia)   . Hypertension   . S/P TAVR (transcatheter aortic valve replacement)   . Severe aortic stenosis 04/26/2017    Family History  Problem Relation Age of Onset  . Transient ischemic attack Mother   . Heart attack Brother 60    Past Surgical History:  Procedure Laterality Date  . LEFT HEART CATHETERIZATION WITH CORONARY ANGIOGRAM N/A 09/27/2014    Non-obstructive disease, CRF reduction recommended. Procedure: LEFT HEART CATHETERIZATION WITH CORONARY ANGIOGRAM;  Surgeon: Micheline Chapman, MD;  Location: [facility] CATH LAB;  Service: Cardiovascular;  Laterality: N/A;  . RIGHT/LEFT HEART CATH AND CORONARY ANGIOGRAPHY N/A 10/30/2018   Procedure: RIGHT/LEFT HEART CATH AND CORONARY ANGIOGRAPHY;  Surgeon: Kathleene Hazel, MD;  Location: [facility] INVASIVE CV LAB;  Service: Cardiovascular;  Laterality: N/A;  . TEE WITHOUT CARDIOVERSION N/A 11/18/2018   Procedure: TRANSESOPHAGEAL ECHOCARDIOGRAM (TEE);  Surgeon: Kathleene Hazel, MD;  Location: [facility] INVASIVE CV LAB;  Service: Open Heart Surgery;  Laterality: N/A;  . TRANSCATHETER AORTIC VALVE REPLACEMENT, TRANSFEMORAL N/A 11/18/2018   Procedure: TRANSCATHETER AORTIC VALVE REPLACEMENT, TRANSFEMORAL;  Surgeon: Kathleene Hazel, MD;  Location: [facility] INVASIVE CV LAB;  Service: Open Heart Surgery;  Laterality: N/A;  . TUBAL LIGATION     Social History   Occupational History  . Occupation: Retired-Secretary  Tobacco Use  . Smoking status: Never Smoker  . Smokeless tobacco: Never Used  Vaping Use  . Vaping Use: Never used  Substance and Sexual Activity  . Alcohol use: No    Alcohol/week: 0.0 standard drinks  . Drug use: No  . Sexual activity: Not on file

## 2020-04-06 ENCOUNTER — Encounter: Payer: Self-pay | Admitting: Orthopaedic Surgery

## 2020-04-06 ENCOUNTER — Ambulatory Visit (INDEPENDENT_AMBULATORY_CARE_PROVIDER_SITE_OTHER): Payer: Medicare HMO

## 2020-04-06 ENCOUNTER — Ambulatory Visit: Payer: Medicare HMO | Admitting: Orthopaedic Surgery

## 2020-04-06 VITALS — Ht 58.25 in | Wt 209.0 lb

## 2020-04-06 DIAGNOSIS — S92324A Nondisplaced fracture of second metatarsal bone, right foot, initial encounter for closed fracture: Secondary | ICD-10-CM

## 2020-04-06 DIAGNOSIS — M79671 Pain in right foot: Secondary | ICD-10-CM

## 2020-04-06 DIAGNOSIS — S42251A Displaced fracture of greater tuberosity of right humerus, initial encounter for closed fracture: Secondary | ICD-10-CM | POA: Diagnosis not present

## 2020-04-06 DIAGNOSIS — G8929 Other chronic pain: Secondary | ICD-10-CM

## 2020-04-06 DIAGNOSIS — M25511 Pain in right shoulder: Secondary | ICD-10-CM | POA: Diagnosis not present

## 2020-04-06 NOTE — Progress Notes (Signed)
Office Visit Note   Patient: Taylor Jenkins           Date of Birth: Mar 27, 1941           MRN: [id number] Visit Date: 04/06/2020              Requested by: [place] [address] [place],  [place] [address] PCP: Tarri Fuller, MD   Assessment & Plan: Visit Diagnoses:  1. Displaced fracture of greater tuberosity of right humerus, initial encounter for closed fracture   2. Nondisplaced fracture of second metatarsal bone, right foot, initial encounter for closed fracture   3. Pain in right foot   4. Chronic right shoulder pain     Plan: Impression is 7 weeks status post right shoulder greater tuberosity fracture and right foot second third metatarsal fractures.  The patient is exhibiting continued healing clinically and radiographically.  In regards to her right shoulder, she will continue with range of motion strengthening exercises.  In regards to the right foot, she will continue with activity as tolerated.  She will follow-up with [name] in 6 weeks time for repeat evaluation of the right shoulder and AP scapular Y x-rays of the right shoulder.  Call with concerns or questions in the meantime.  Follow-Up Instructions: Return in about 6 weeks (around 05/18/2020).   Orders:  Orders Placed This Encounter  Procedures  . XR Foot Complete Right  . XR Shoulder Right   No orders of the defined types were placed in this encounter.     Procedures: No procedures performed   Clinical Data: No additional findings.   Subjective: Chief Complaint  Patient presents with  . Right Shoulder - Follow-up  . Right Foot - Follow-up    HPI patient is a pleasant 79 year old female who comes in today 7 weeks out right shoulder greater tuberosity fracture and right foot second and third metatarsal fractures date of injury 02/16/2020.  In regards to her right shoulder, therapy came out to the house and showed her home exercise program as they did not believe she needed formal  therapy.  She has been compliant with this.  She has minimal to no pain in the right shoulder.  In regards to the right foot, she is ambulating in a regular shoe with only minimal tenderness.  Both appear to be significantly improving.  Review of Systems as detailed in HPI.  All others reviewed and are negative.   Objective: Vital Signs: Ht 4' 10.25" (1.48 m)   Wt 209 lb (94.8 kg)   BMI 43.31 kg/m   Physical Exam well-developed well-nourished female no acute distress.  Alert oriented x3.  Ortho Exam examination of the right shoulder reveals minimal tenderness at the fracture site.  She has near full range of motion in all planes.  Right foot has minimal tenderness to the second and third distal metatarsal.  She is neurovascularly intact distally.  Specialty Comments:  No specialty comments available.  Imaging: XR Foot Complete Right  Result Date: 04/06/2020 X-rays demonstrate healed second and third metatarsal fractures  XR Shoulder Right  Result Date: 04/06/2020 X-rays demonstrate considerable bony consolidation to the fracture    PMFS History: Patient Active Problem List   Diagnosis Date Noted  . Nondisplaced fracture of second metatarsal bone, right foot, initial encounter for closed fracture 02/19/2020  . Displaced fracture of greater tuberosity of right humerus, initial encounter for closed fracture 02/19/2020  . Orthostatic hypotension 02/11/2020  . S/P TAVR (transcatheter aortic valve  replacement) 11/18/2018  . Atrial fibrillation, persistent (HCC)   . Severe aortic stenosis   . Obesity (BMI 30-39.9) 04/29/2017  . Symptomatic anemia 04/25/2017  . Melena 04/24/2017  . Right knee injury 08/03/2015  . CAD- mild, non obstructive by cath 09/27/14 09/28/2014  . Statin intolerance 09/28/2014  . Demand ischemia (HCC) 09/25/2014  . Chronic diastolic (congestive) heart failure (HCC) 09/25/2014  . Non-insulin dependent type 2 diabetes mellitus (HCC) 09/25/2014  . HTN  (hypertension) 09/25/2014  . HLD (hyperlipidemia) 09/25/2014   Past Medical History:  Diagnosis Date  . Anemia   . Aortic atherosclerosis (HCC) 04/29/2017  . Asthma   . Atrial fibrillation, persistent (HCC)   . CAD (coronary artery disease) 2016   non-obstructive at cath  . Chronic diastolic CHF (congestive heart failure) (HCC)   . Diabetes mellitus without complication (HCC)    type 2  . HLD (hyperlipidemia)   . Hypertension   . S/P TAVR (transcatheter aortic valve replacement)   . Severe aortic stenosis 04/26/2017    Family History  Problem Relation Age of Onset  . Transient ischemic attack Mother   . Heart attack Brother 85    Past Surgical History:  Procedure Laterality Date  . LEFT HEART CATHETERIZATION WITH CORONARY ANGIOGRAM N/A 09/27/2014   Non-obstructive disease, CRF reduction recommended. Procedure: LEFT HEART CATHETERIZATION WITH CORONARY ANGIOGRAM;  Surgeon: Micheline Chapman, MD;  Location: [facility] CATH LAB;  Service: Cardiovascular;  Laterality: N/A;  . RIGHT/LEFT HEART CATH AND CORONARY ANGIOGRAPHY N/A 10/30/2018   Procedure: RIGHT/LEFT HEART CATH AND CORONARY ANGIOGRAPHY;  Surgeon: Kathleene Hazel, MD;  Location: [facility] INVASIVE CV LAB;  Service: Cardiovascular;  Laterality: N/A;  . TEE WITHOUT CARDIOVERSION N/A 11/18/2018   Procedure: TRANSESOPHAGEAL ECHOCARDIOGRAM (TEE);  Surgeon: Kathleene Hazel, MD;  Location: [facility] INVASIVE CV LAB;  Service: Open Heart Surgery;  Laterality: N/A;  . TRANSCATHETER AORTIC VALVE REPLACEMENT, TRANSFEMORAL N/A 11/18/2018   Procedure: TRANSCATHETER AORTIC VALVE REPLACEMENT, TRANSFEMORAL;  Surgeon: Kathleene Hazel, MD;  Location: [facility] INVASIVE CV LAB;  Service: Open Heart Surgery;  Laterality: N/A;  . TUBAL LIGATION     Social History   Occupational History  . Occupation: Retired-Secretary  Tobacco Use  . Smoking status: Never Smoker  . Smokeless tobacco: Never Used  Vaping Use  . Vaping Use: Never used  Substance and  Sexual Activity  . Alcohol use: No    Alcohol/week: 0.0 standard drinks  . Drug use: No  . Sexual activity: Not on file

## 2020-04-28 IMAGING — CT CT ANGIO CHEST
2 of 19 series · 13 of 37 positions shown · IV contrast (APPLIED)
Comparison: CT the abdomen and pelvis 04/27/2017.

CLINICAL DATA: 77-year-old female with history of severe aortic
stenosis. Preprocedural study prior to potential transcatheter
aortic valve replacement (TAVR) procedure.

EXAM:
CT ANGIOGRAPHY CHEST, ABDOMEN AND PELVIS
TECHNIQUE: Multidetector CT imaging through the chest, abdomen and pelvis was
performed using the standard protocol during bolus administration of
intravenous contrast. Multiplanar reconstructed images and MIPs were
obtained and reviewed to evaluate the vascular anatomy.
CONTRAST:  120mL OMNIPAQUE IOHEXOL 350 MG/ML SOLN

[Series 11: 0-90% · axial · 0.39mm/px · z∈[+1040,+1175]mm · 6 of 3160 slices shown]
[im 452/3160  lung]
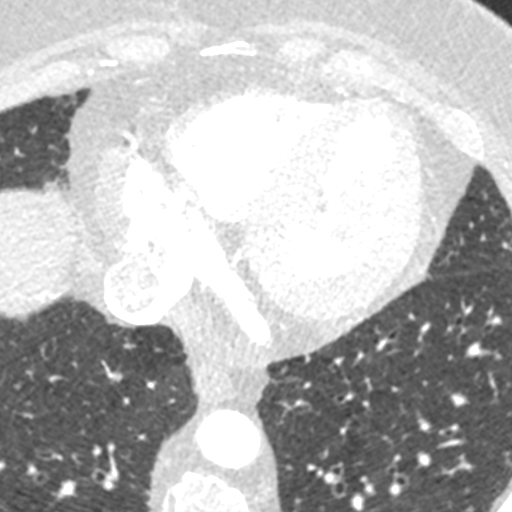
[im 903/3160  lung]
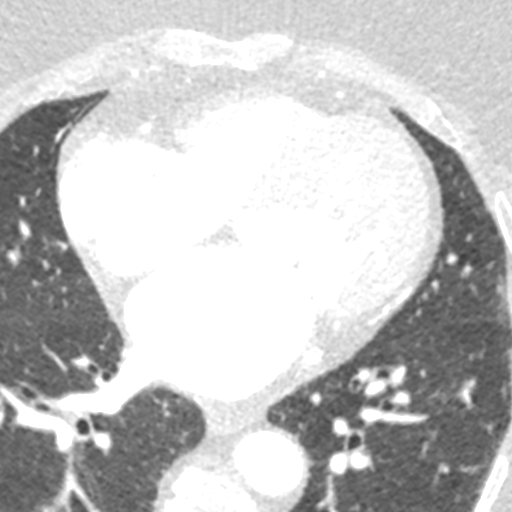
[im 1354/3160  lung]
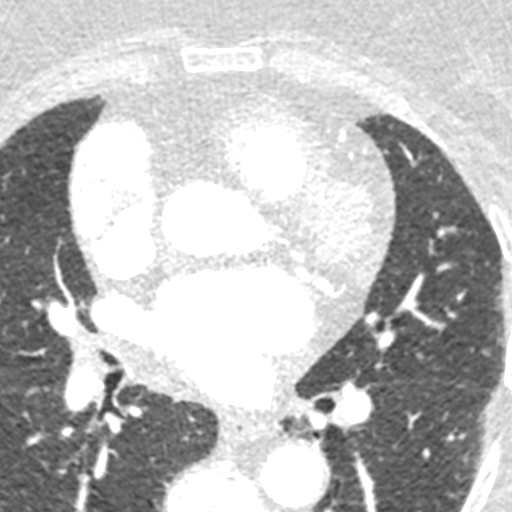
[im 1806/3160  lung]
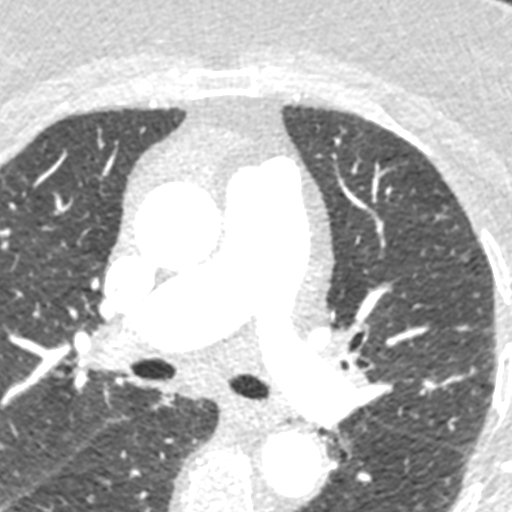
[im 2257/3160  lung]
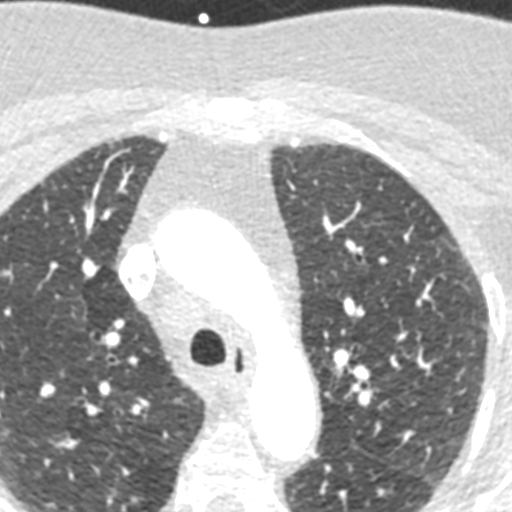
[im 2708/3160  lung]
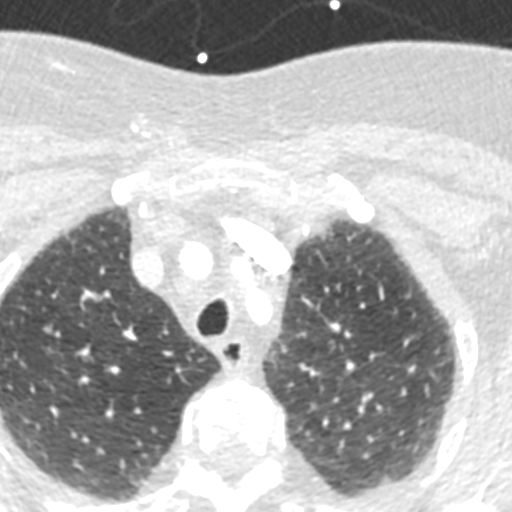

[Series 12: 5-95% · axial · 0.39mm/px · z∈[+1037,+1179]mm · 7 of 3160 slices shown]
[im 395/3160  lung]
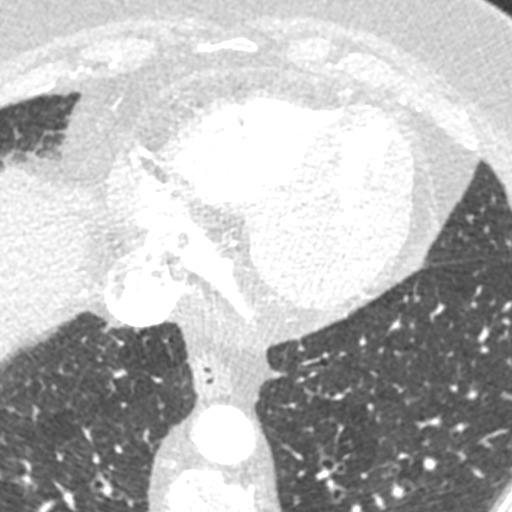
[im 790/3160  mediastinal]
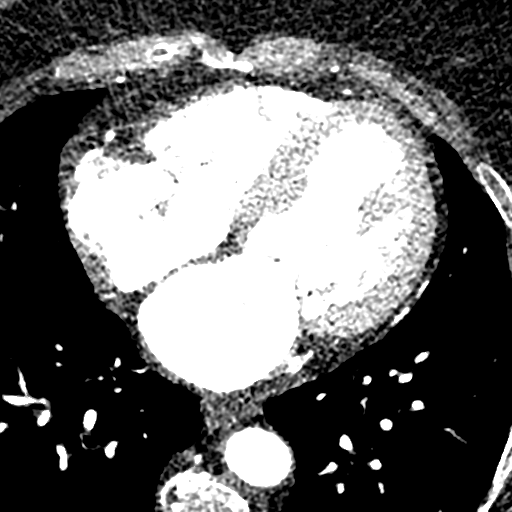
[im 1185/3160  lung]
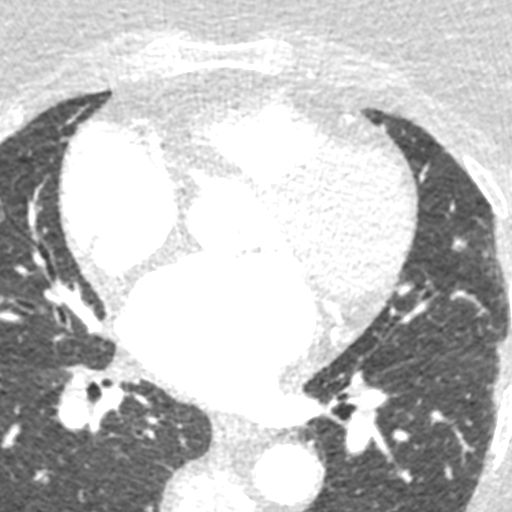
[im 1580/3160  mediastinal]
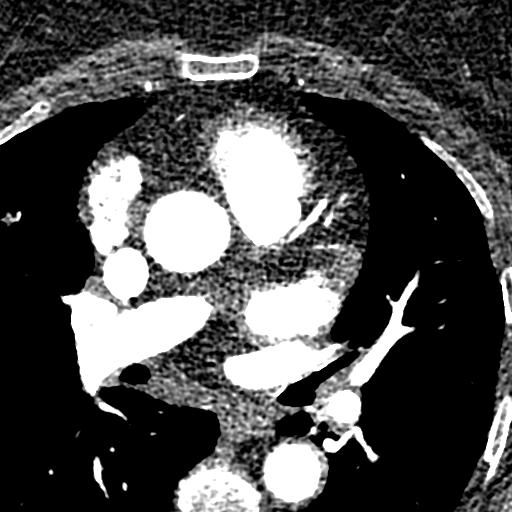
[im 1975/3160  lung]
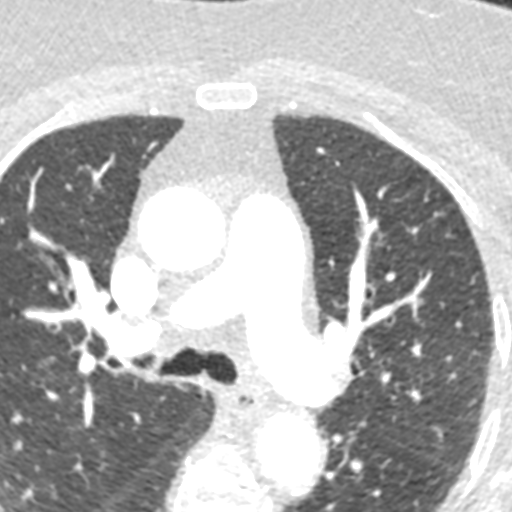
[im 2370/3160  mediastinal]
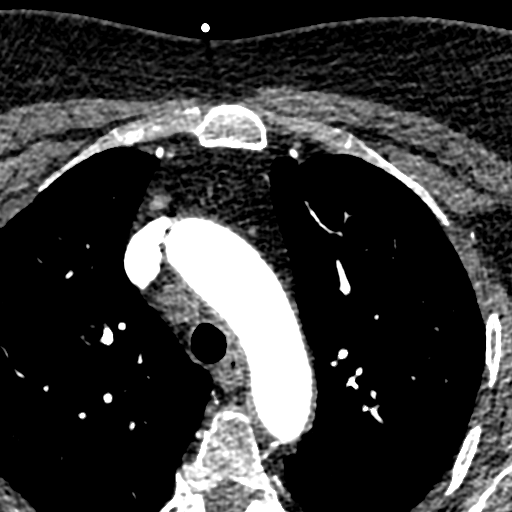
[im 2765/3160  lung]
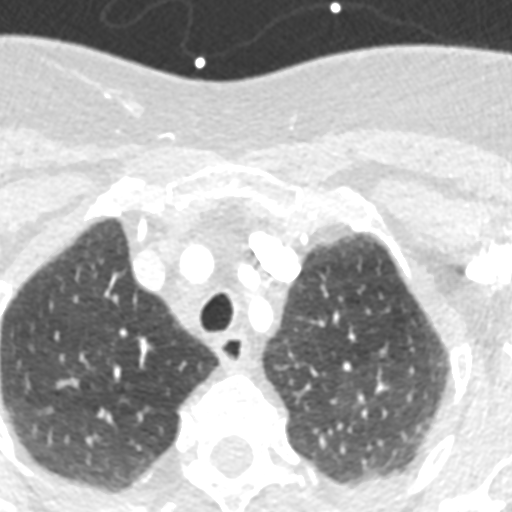

[13 of 37 positions shown; findings below may reference images not displayed]

FINDINGS: CTA CHEST FINDINGS

Cardiovascular: Heart size is enlarged with left atrial dilatation.
There is no significant pericardial fluid, thickening or pericardial
calcification. There is aortic atherosclerosis, as well as
atherosclerosis of the great vessels of the mediastinum and the
coronary arteries, including calcified atherosclerotic plaque in the
left main, left anterior descending and right coronary arteries.
Severe thickening calcification of the aortic valve. Moderate to
severe calcifications of the mitral annulus.

Mediastinum/Lymph Nodes: No pathologically enlarged mediastinal or
hilar lymph nodes. Esophagus is unremarkable in appearance. No
axillary lymphadenopathy.

Lungs/Pleura: No acute consolidative airspace disease. No pleural
effusions. No suspicious appearing pulmonary nodules or masses are
noted.

Musculoskeletal/Soft Tissues: There are no aggressive appearing
lytic or blastic lesions noted in the visualized portions of the
skeleton.

CTA ABDOMEN AND PELVIS FINDINGS

Hepatobiliary: Liver has a shrunken appearance and nodular contour,
indicative of cirrhosis. In segment 3 of the liver there is a 1.1 x
1.4 cm hypervascular area (axial image 86 of series 18). No intra or
extrahepatic biliary ductal dilatation. Gallbladder is normal in
appearance.

Pancreas: No pancreatic mass. No pancreatic ductal dilatation. No
pancreatic or peripancreatic fluid or inflammatory changes.

Spleen: Wedge-shaped area of hypoperfusion in the spleen concerning
for potential infarct (axial image 83 of series 18).

Adrenals/Urinary Tract: Bilateral kidneys and bilateral adrenal
glands are normal in appearance. No hydroureteronephrosis. Urinary
bladder is normal in appearance.

Stomach/Bowel: Normal appearance of the stomach. No pathologic
dilatation of small bowel or colon. The appendix is not confidently
identified and may be surgically absent. Regardless, there are no
inflammatory changes noted adjacent to the cecum to suggest the
presence of an acute appendicitis at this time.

Vascular/Lymphatic: Aortic atherosclerosis, without evidence of
aneurysm or dissection in the abdominal or pelvic vasculature.
Vascular findings and measurements pertinent to potential TAVR
procedure, as detailed below. No lymphadenopathy noted in the
abdomen or pelvis.

Reproductive: Uterus and ovaries are unremarkable in appearance.

Other: No significant volume of ascites.  No pneumoperitoneum.

Musculoskeletal: There are no aggressive appearing lytic or blastic
lesions noted in the visualized portions of the skeleton.

VASCULAR MEASUREMENTS PERTINENT TO TAVR:

AORTA:

Minimal Aortic 7iameter-I4 x 9 mm

Severity of Aortic Calcification-moderate

RIGHT PELVIS:

Right Common Iliac Artery -

Minimal Fiameter-3.O x 6.2 mm

Tortuosity-mild

Calcification-moderate

Right External Iliac Artery -

Minimal Ziameter-D.9 x 5.8 mm

Tortuosity-moderate

Calcification-none

Right Common Femoral Artery -

Minimal Jiameter-S.A x 6.1 mm

Tortuosity-mild

Calcification-mild

LEFT PELVIS:

Left Common Iliac Artery -

Minimal 2iameter-B.T x 5.1 mm

Tortuosity-mild

Calcification-moderate

Left External Iliac Artery -

Minimal Diameter-D.D x 6.4 mm

Tortuosity-moderate

Calcification-none

Left Common Femoral Artery -

Minimal Fiameter-7.0 x 5.8 mm

Tortuosity-mild

Calcification-mild

Review of the MIP images confirms the above findings.
IMPRESSION: 1. Vascular findings and measurements pertinent to potential TAVR
procedure, as detailed above.
2. Thickening calcification of the aortic valve, compatible with the
reported clinical history of severe aortic stenosis.
3. Aortic atherosclerosis, in addition to left main and 2 vessel
coronary artery disease. Assessment for potential risk factor
modification, dietary therapy or pharmacologic therapy may be
warranted, if clinically indicated.
4. There is also moderate to severe calcifications of the mitral
annulus.
5. The liver is cirrhotic, and there is a hypervascular area in
segment 3 of the liver. Although this is favored to represent a
perfusion anomaly, given the cirrhosis, further evaluation with
nonemergent abdominal MRI with and without IV gadolinium is strongly
recommended in the near future to better characterize this finding,
as the possibility of hepatocellular carcinoma is not excluded.
6. Wedge-shaped area of hypoperfusion in the spleen concerning for
potential splenic infarct. This could alternatively be related to
the early phase of enhancement. Attention at time of forthcoming
abdominal MRI is recommended to better evaluate this finding during
a multiphase examination.

## 2020-05-18 ENCOUNTER — Ambulatory Visit: Payer: Medicare HMO | Admitting: Orthopaedic Surgery

## 2020-06-22 ENCOUNTER — Encounter: Payer: Self-pay | Admitting: Internal Medicine

## 2020-06-22 ENCOUNTER — Ambulatory Visit: Payer: Medicare HMO | Admitting: Internal Medicine

## 2020-06-22 VITALS — BP 144/92 | HR 74 | Ht <= 58 in | Wt 211.6 lb

## 2020-06-22 DIAGNOSIS — Z952 Presence of prosthetic heart valve: Secondary | ICD-10-CM | POA: Diagnosis not present

## 2020-06-22 DIAGNOSIS — I4819 Other persistent atrial fibrillation: Secondary | ICD-10-CM | POA: Diagnosis not present

## 2020-06-22 DIAGNOSIS — I951 Orthostatic hypotension: Secondary | ICD-10-CM

## 2020-06-22 MED ORDER — FUROSEMIDE 20 MG PO TABS: ORAL_TABLET | ORAL | 3 refills | Status: DC

## 2020-06-22 NOTE — Patient Instructions (Signed)
Medication Instructions:  INCREASE furosemide to 20mg  daily OK to take extra tablet daily as needed  *If you need a refill on your cardiac medications before your next appointment, please call your pharmacy*   Follow-Up: At [facility], you and your health needs are our priority.  As part of our continuing mission to provide you with exceptional heart care, we have created designated Provider Care Teams.  These Care Teams include your primary Cardiologist (physician) and Advanced Practice Providers (APPs -  Physician Assistants and Nurse Practitioners) who all work together to provide you with the care you need, when you need it.  We recommend signing up for the patient portal called "MyChart".  Sign up information is provided on this After Visit Summary.  MyChart is used to connect with patients for Virtual Visits (Telemedicine).  Patients are able to view lab/test results, encounter notes, upcoming appointments, etc.  Non-urgent messages can be sent to your provider as well.   To learn more about what you can do with MyChart, go to [facility].    Your next appointment:   12 month(s)  The format for your next appointment:   In Person  Provider:   You may see Dr. [email or web address] or one of the following Advanced Practice Providers on your designated Care Team:    Rennis Golden, PA-C  Azalee Course, Micah Flesher or   [place], Judy Pimple    Other Instructions

## 2020-06-22 NOTE — Progress Notes (Signed)
OFFICE CONSULT NOTE  Chief Complaint:  Follow-up  Primary Care Physician: Rubie Maid, Taylor Jenkins  HPI:  Taylor Jenkins is a 79 y.o. female who is being seen today for the evaluation of establish cardiologist at the request of Rubie Maid, Taylor Jenkins.  This is a pleasant 79 year old female who is here today to establish a new cardiologist.  She was previously followed by Dr. Darral Dash in [facility].  Past medical history significant for aortic stenosis, chronic atrial fibrillation, morbid obesity, mitral annular calcification, AVM with iron deficiency anemia and chronic blood loss, cardiomegaly, type 2 diabetes, hypertension and dyslipidemia.  Her 2 daughters are both nurses, 1 working at [employer] in [employer] [place] the other is the Therapist, sports on [employer] at [employer].  Her last echocardiogram was in October 2018.  This demonstrated LVEF 70%, mild LVH, moderate MAC, trileaflet aortic valve with moderate to severe aortic stenosis and a mean gradient of 42 mmHg, and a severely dilated left atrium.  Despite this the office notes indicated moderate to severe left ear.  Symptomatically, however she has had worsening shortness of breath, significant fatigue and chest discomfort with exertion.  This is been progressively worse in fact has limited her activities significantly.  In addition she is struggled with anemia and recently thought this was the reason for her fatigue.  She however did have blood work which showed no significant iron deficiency at this point.  Because of small bowel AVMs and her anemia, apparently she has not been anticoagulated appropriately for her degree of atrial fibrillation.  Her CHADSVASC score is high, likely 4-5, indicating at least a 10% annual risk of stroke.  Finally, in addition to her fatigue, she reports poor sleep at night.  She had an Epworth sleepiness scale score of 18 which is significantly elevated.  Per her daughter she has a loud snoring, witnessed apnea, increased neck thickness,  morbid obesity and other risk factors for sleep apnea.  A sleep study is not yet been performed.  01/08/2019  Taylor Jenkins was seen today in follow-up regarding recent TAVR in March 2020.  She has a history of severe aortic stenosis and atrial fibrillation which is been longstanding persistent as well as diabetes, hypertension and dyslipidemia and chronic diastolic heart failure symptoms.  She underwent left heart catheterization prior to the procedure which showed mild to moderate nonobstructive coronary disease and ultimately underwent placement of a 23 mm Edwards Sapien 3 TAVR valve which was considered successful.  A repeat echo on November 19, 2018 showed normal LV function, a mean aortic valve gradient of 10 mmHg, and mild to moderate perivalvular leak.  She has been maintained on torsemide 10 mg twice daily and had initial weight loss down to 196 pounds but has crept up to 203 pounds and is stable at this weight.  She denies any orthopnea, lower extremity edema or associated symptoms, but does get short of breath with notable increase in heart rate doing activities such as housework or walking upstairs.  She was also noted to have some periprocedural anemia but did not require blood transfusion.  10/02/2019  Taylor Jenkins returns today for follow-up. She still has complaints of shortness of breath particularly with exertion. She fatigues easily, such as after vacuuming her house. She has to stop and rest periodically. She says she is having problems with some dizziness. She has several episodes during the day where she gets transient dizziness, lightheadedness and feelings of possible diplopia. She has been struggling with some sinus  congestion as well. Additionally, her daughter noted that she has been having some issues with vaginal dryness and asked if this could be related to her medications. Particular she has irritation which I have noted is not likely medication related but should be further evaluated by  her GYN. She is scheduled for repeat echo in March and follow-up in the valve clinic. She had been seen in the interim by Dr. Donnetta Hutching with vascular surgery for a toe ulcer which ultimately thought to be a diabetic ulcer. Arterial Dopplers were not significantly abnormal although she does have neuropathy and chronic stasis edema related to that.  06/22/2020  Taylor Jenkins is seen today in follow-up.  She is accompanied by her daughter who is an emergency room nurse at [employer] [facility] [employer].  She was seen this past spring in the A. fib clinic and felt to have permanent atrial fibrillation at this point.  Her medications were further adjusted.  She was taken off of torsemide onto Lasix.  She had some problems with hypotension and earlier this year.  She has had some injuries and falls including a humerus fracture and has had issues with her right ankle.  She continues to have lower extremity swelling.  She seems to be doing well however though after TAVR.  She has a low transvalvular gradient by echo earlier this year and trivial perivalvular leak.  PMHx:  Past Medical History:  Diagnosis Date  . Anemia   . Aortic atherosclerosis ([place]) 04/29/2017  . Asthma   . Atrial fibrillation, persistent ([place])   . CAD (coronary artery disease) 2016   non-obstructive at cath  . Chronic diastolic CHF (congestive heart failure) ([place])   . Diabetes mellitus without complication ([place])    type 2  . HLD (hyperlipidemia)   . Hypertension   . S/P TAVR (transcatheter aortic valve replacement)   . Severe aortic stenosis 04/26/2017    Past Surgical History:  Procedure Laterality Date  . LEFT HEART CATHETERIZATION WITH CORONARY ANGIOGRAM N/A 09/27/2014   Non-obstructive disease, CRF reduction recommended. Procedure: LEFT HEART CATHETERIZATION WITH CORONARY ANGIOGRAM;  Surgeon: Blane Ohara, Taylor Jenkins;  Location: [facility] CATH LAB;  Service: Cardiovascular;  Laterality: N/A;  . RIGHT/LEFT HEART CATH AND CORONARY ANGIOGRAPHY N/A 10/30/2018    Procedure: RIGHT/LEFT HEART CATH AND CORONARY ANGIOGRAPHY;  Surgeon: Burnell Blanks, Taylor Jenkins;  Location: [place] CV LAB;  Service: Cardiovascular;  Laterality: N/A;  . TEE WITHOUT CARDIOVERSION N/A 11/18/2018   Procedure: TRANSESOPHAGEAL ECHOCARDIOGRAM (TEE);  Surgeon: Burnell Blanks, Taylor Jenkins;  Location: [place] CV LAB;  Service: Open Heart Surgery;  Laterality: N/A;  . TRANSCATHETER AORTIC VALVE REPLACEMENT, TRANSFEMORAL N/A 11/18/2018   Procedure: TRANSCATHETER AORTIC VALVE REPLACEMENT, TRANSFEMORAL;  Surgeon: Burnell Blanks, Taylor Jenkins;  Location: [place] CV LAB;  Service: Open Heart Surgery;  Laterality: N/A;  . TUBAL LIGATION      FAMHx:  Family History  Problem Relation Age of Onset  . Transient ischemic attack Mother   . Heart attack Brother 49    SOCHx:   reports that she has never smoked. She has never used smokeless tobacco. She reports that she does not drink alcohol and does not use drugs.  ALLERGIES:  Allergies  Allergen Reactions  . Codeine Shortness Of Breath and Itching  . Epinephrine Shortness Of Breath and Palpitations  . Penicillins Anaphylaxis    Did it involve swelling of the face/tongue/throat, SOB, or low BP? Yes Did it involve sudden or severe rash/hives, skin peeling, or any reaction on  the inside of your mouth or nose? No Did you need to seek medical attention at a Jenkins or doctor's office? Yes When did it last happen?40+ years If all above answers are "NO", may proceed with cephalosporin use.   . Prednisone Other (See Comments)    psychosis   . Statins Other (See Comments)    Myalgias   . Lidocaine Palpitations  . Sulfa Antibiotics Rash    ROS: Pertinent items noted in HPI and remainder of comprehensive ROS otherwise negative.  HOME MEDS: Current Outpatient Medications on File Prior to Visit  Medication Sig Dispense Refill  . allopurinol (ZYLOPRIM) 100 MG tablet Take 1 tablet (100 mg total) by mouth daily. 90 tablet 1   . Ascorbic Acid (VITAMIN C) 1000 MG tablet Take 1,000 mg by mouth daily.    . calcium carbonate (TUMS EX) 750 MG chewable tablet Chew 1 tablet by mouth as needed for heartburn.     Marland Kitchen ELIQUIS 5 MG TABS tablet TAKE 1 TABLET BY MOUTH TWICE A DAY 60 tablet 10  . ferrous sulfate 325 (65 FE) MG tablet Take 325 mg by mouth daily.    . furosemide (LASIX) 20 MG tablet Take 1-2 tablets by mouth daily as needed.     . insulin NPH Human (NOVOLIN N) 100 UNIT/ML injection Inject 50 Units into the skin in the morning and at bedtime.     Marland Kitchen losartan (COZAAR) 25 MG tablet Take 25 mg by mouth daily.    . metoprolol tartrate (LOPRESSOR) 25 MG tablet Take 1 tablet (25 mg total) by mouth 2 (two) times daily.    . Multiple Vitamin (MULTIVITAMIN ADULT PO) Take 1 tablet by mouth daily.    . nitroGLYCERIN (NITROSTAT) 0.4 MG SL tablet Place 1 tablet (0.4 mg total) under the tongue every 5 (five) minutes x 3 doses as needed for chest pain (or severe SOB). 25 tablet 2  . nystatin (MYCOSTATIN/NYSTOP) powder Apply 1 application topically once as needed (for skin iritation).    . potassium chloride (KLOR-CON) 10 MEQ tablet Take 1 tablet (10 mEq total) by mouth daily. 90 tablet 3  . zinc gluconate 50 MG tablet Take 50 mg by mouth daily.     No current facility-administered medications on file prior to visit.    LABS/IMAGING: No results found for this or any previous visit (from the past 48 hour(s)). No results found.  LIPID PANEL:    Component Value Date/Time   CHOL 219 (H) 09/26/2014 0030   TRIG 231 (H) 09/26/2014 0030   HDL 43 09/26/2014 0030   CHOLHDL 5.1 09/26/2014 0030   VLDL 46 (H) 09/26/2014 0030   LDLCALC 130 (H) 09/26/2014 0030    WEIGHTS: Wt Readings from Last 3 Encounters:  06/22/20 211 lb 9.6 oz (96 kg)  04/06/20 209 lb (94.8 kg)  03/16/20 214 lb 12.8 oz (97.4 kg)    VITALS: Ht _0  (1.448 m)   Wt 211 lb 9.6 oz (96 kg)   BMI 45.79 kg/m   EXAM: General appearance: alert and no  distress Neck: no carotid bruit, no JVD and thyroid not enlarged, symmetric, no tenderness/mass/nodules Lungs: clear to auscultation bilaterally Heart: regular rate and rhythm, S1: normal, S2: Absent and systolic murmur: systolic ejection 3/6, crescendo at 2nd right intercostal space Abdomen: soft, non-tender; bowel sounds normal; no masses,  no organomegaly and obese Extremities: extremities normal, atraumatic, no cyanosis or edema Pulses: 2+ and symmetric Skin: Skin color, texture, turgor normal. No rashes or lesions Neurologic:  Grossly normal Psych: Pleasant  EKG: A. fib at 75 -personally reviewed  ASSESSMENT: 1. Severe symptomatic aortic stenosis -status post 23 mm Edwards Safian 3 TAVR (11/2018), trivial perivalvular leak 2. Permanent atrial fibrillation 3. Severe left atrial enlargement 4. Morbid obesity 5. Type 2 diabetes 6. Hypertension 7. Hyperlipidemia 8. Family history of premature onset coronary disease 9. Probable obstructive sleep apnea 10. Dizziness 11. Vaginal dryness  PLAN: 1.   Taylor Jenkins has had a number of other medical issues this year including right humerus fracture as well as issues with her right ankle.  She continues to have swelling.  At one point she was on higher dose diuretics but this was reduced due to low blood pressure.  She is currently only on the Lasix 10 mg a day.  She has some persistent swelling and I think she could tolerate 20 mg daily.  Her TAVR valve is stable.  She is in permanent A. fib without bleeding issues.  She has persistent issues with anemia and is followed by a hematologist in [facility].  So far other than some AVMs which were treated she persists with anemia and the diagnosis is not been clear to me.  Plan follow-up annually or sooner as necessary.  Taylor Casino, Taylor Jenkins, Taylor Jenkins, Taylor Jenkins Director of the [facility]  [facility] Diplomate of the American  Board of Clinical Lipidology Attending Cardiologist  Direct Dial: [phone]  Fax: [phone]  Website:  [email or web address] Varie [email or web address] 06/22/2020, 2:29 PM

## 2020-10-10 ENCOUNTER — Telehealth: Payer: Self-pay | Admitting: Internal Medicine

## 2020-10-10 DIAGNOSIS — D500 Iron deficiency anemia secondary to blood loss (chronic): Secondary | ICD-10-CM

## 2020-10-10 NOTE — Telephone Encounter (Signed)
Spoke with patient's daughter. Notified her that referral has been ordered

## 2020-10-10 NOTE — Telephone Encounter (Signed)
Patient's daughter wants to know if Dr. Rennis Golden could send over a referral to Dr. Arlan Organ. Phone # is [phone]

## 2020-10-10 NOTE — Telephone Encounter (Signed)
Spoke with pt daughter, she wanted the patient to be seen for problems with her hemoglobin. She has been seeing someone at [facility] but she is not pleased with them and would like to see someone else. The patient has been having to have iron infusions every month. Daughter will check with Francine Graven to make sure referral from [name] is okay. Will forward to dr hilty for okay to place referral.

## 2020-10-10 NOTE — Telephone Encounter (Signed)
Would be happy to make a referral to Dr. Myna Hidalgo - he is excellent!

## 2020-10-11 NOTE — Addendum Note (Signed)
Addended by: Lindell Spar on: 10/11/2020 10:38 AM   Modules accepted: Orders

## 2020-10-17 ENCOUNTER — Telehealth: Payer: Self-pay | Admitting: *Deleted

## 2020-10-17 NOTE — Telephone Encounter (Signed)
Called and lvm of upcoming appts. - mailed new packet with calendar

## 2020-11-05 ENCOUNTER — Other Ambulatory Visit: Payer: Self-pay | Admitting: Physician Assistant

## 2020-11-05 DIAGNOSIS — I4819 Other persistent atrial fibrillation: Secondary | ICD-10-CM

## 2020-11-07 NOTE — Telephone Encounter (Signed)
Prescription refill request for Eliquis received. Indication:a fib Last office visit: 06/22/20 Scr: 0.99 (11/18/19) Age:  79 Weight: 96 kg  Patient has lab appointment on 3/22.  Will send 1 month untiil labs are updated

## 2020-11-14 ENCOUNTER — Other Ambulatory Visit: Payer: Self-pay | Admitting: Physician Assistant

## 2020-11-14 ENCOUNTER — Other Ambulatory Visit: Payer: Medicare HMO

## 2020-11-14 ENCOUNTER — Ambulatory Visit: Payer: Medicare HMO | Admitting: Hematology & Oncology

## 2020-11-14 DIAGNOSIS — I5031 Acute diastolic (congestive) heart failure: Secondary | ICD-10-CM

## 2020-11-22 ENCOUNTER — Other Ambulatory Visit: Payer: Self-pay

## 2020-11-22 ENCOUNTER — Encounter: Payer: Self-pay | Admitting: Hematology & Oncology

## 2020-11-22 ENCOUNTER — Inpatient Hospital Stay: Payer: Medicare HMO | Attending: Hematology & Oncology

## 2020-11-22 ENCOUNTER — Telehealth: Payer: Self-pay

## 2020-11-22 ENCOUNTER — Inpatient Hospital Stay: Payer: Medicare HMO

## 2020-11-22 ENCOUNTER — Inpatient Hospital Stay (HOSPITAL_BASED_OUTPATIENT_CLINIC_OR_DEPARTMENT_OTHER): Payer: Medicare HMO | Admitting: Hematology & Oncology

## 2020-11-22 ENCOUNTER — Other Ambulatory Visit: Payer: Self-pay | Admitting: *Deleted

## 2020-11-22 VITALS — BP 120/66 | HR 73 | Temp 98.4°F | Resp 18 | Ht <= 58 in | Wt 233.2 lb

## 2020-11-22 DIAGNOSIS — E119 Type 2 diabetes mellitus without complications: Secondary | ICD-10-CM | POA: Diagnosis not present

## 2020-11-22 DIAGNOSIS — I4819 Other persistent atrial fibrillation: Secondary | ICD-10-CM

## 2020-11-22 DIAGNOSIS — K31811 Angiodysplasia of stomach and duodenum with bleeding: Secondary | ICD-10-CM | POA: Diagnosis present

## 2020-11-22 DIAGNOSIS — D6869 Other thrombophilia: Secondary | ICD-10-CM

## 2020-11-22 DIAGNOSIS — J810 Acute pulmonary edema: Secondary | ICD-10-CM

## 2020-11-22 DIAGNOSIS — D500 Iron deficiency anemia secondary to blood loss (chronic): Secondary | ICD-10-CM

## 2020-11-22 DIAGNOSIS — I5032 Chronic diastolic (congestive) heart failure: Secondary | ICD-10-CM

## 2020-11-22 HISTORY — DX: Angiodysplasia of stomach and duodenum with bleeding: K31.811

## 2020-11-22 HISTORY — DX: Iron deficiency anemia secondary to blood loss (chronic): D50.0

## 2020-11-22 LAB — CBC WITH DIFFERENTIAL (CANCER CENTER ONLY)
Abs Immature Granulocytes: 0.18 10*3/uL — ABNORMAL HIGH (ref 0.00–0.07)
Basophils Absolute: 0 10*3/uL (ref 0.0–0.1)
Basophils Relative: 0 %
Eosinophils Absolute: 0.3 10*3/uL (ref 0.0–0.5)
Eosinophils Relative: 3 %
HCT: 28.5 % — ABNORMAL LOW (ref 36.0–46.0)
Hemoglobin: 8.4 g/dL — ABNORMAL LOW (ref 12.0–15.0)
Immature Granulocytes: 2 %
Lymphocytes Relative: 13 %
Lymphs Abs: 1.4 10*3/uL (ref 0.7–4.0)
MCH: 28.8 pg (ref 26.0–34.0)
MCHC: 29.5 g/dL — ABNORMAL LOW (ref 30.0–36.0)
MCV: 97.6 fL (ref 80.0–100.0)
Monocytes Absolute: 0.8 10*3/uL (ref 0.1–1.0)
Monocytes Relative: 8 %
Neutro Abs: 8 10*3/uL — ABNORMAL HIGH (ref 1.7–7.7)
Neutrophils Relative %: 74 %
Platelet Count: 197 10*3/uL (ref 150–400)
RBC: 2.92 MIL/uL — ABNORMAL LOW (ref 3.87–5.11)
RDW: 19.1 % — ABNORMAL HIGH (ref 11.5–15.5)
WBC Count: 10.8 10*3/uL — ABNORMAL HIGH (ref 4.0–10.5)
nRBC: 0 % (ref 0.0–0.2)

## 2020-11-22 LAB — CMP (CANCER CENTER ONLY)
ALT: 27 U/L (ref 0–44)
AST: 25 U/L (ref 15–41)
Albumin: 3.9 g/dL (ref 3.5–5.0)
Alkaline Phosphatase: 82 U/L (ref 38–126)
Anion gap: 6 (ref 5–15)
BUN: 27 mg/dL — ABNORMAL HIGH (ref 8–23)
CO2: 28 mmol/L (ref 22–32)
Calcium: 8.9 mg/dL (ref 8.9–10.3)
Chloride: 107 mmol/L (ref 98–111)
Creatinine: 0.88 mg/dL (ref 0.44–1.00)
GFR, Estimated: >60 mL/min (ref >60)
Glucose, Bld: 139 mg/dL — ABNORMAL HIGH (ref 70–99)
Potassium: 4 mmol/L (ref 3.5–5.1)
Sodium: 141 mmol/L (ref 135–145)
Total Bilirubin: 0.6 mg/dL (ref 0.3–1.2)
Total Protein: 6.6 g/dL (ref 6.5–8.1)

## 2020-11-22 LAB — IRON AND TIBC
Iron: 44 ug/dL (ref 41–142)
Saturation Ratios: 11 % — ABNORMAL LOW (ref 21–57)
TIBC: 404 ug/dL (ref 236–444)
UIBC: 360 ug/dL (ref 120–384)

## 2020-11-22 LAB — RETICULOCYTES
Immature Retic Fract: 35.6 % — ABNORMAL HIGH (ref 2.3–15.9)
RBC.: 2.92 MIL/uL — ABNORMAL LOW (ref 3.87–5.11)
Retic Count, Absolute: 221 10*3/uL — ABNORMAL HIGH (ref 19.0–186.0)
Retic Ct Pct: 7.6 % — ABNORMAL HIGH (ref 0.4–3.1)

## 2020-11-22 LAB — FERRITIN: Ferritin: 67 ng/mL (ref 11–307)

## 2020-11-22 LAB — SAVE SMEAR(SSMR), FOR PROVIDER SLIDE REVIEW

## 2020-11-22 NOTE — Progress Notes (Signed)
Referral MD  Reason for Referral: Normochromic normocytic anemia  Chief Complaint  Patient presents with  . New Patient (Initial Visit)  : I just feel very tired.  HPI: Taylor Jenkins is a very nice 80 year old white female.  She has multiple health issues.  She has atrial fibrillation.  She is on Eliquis.  She has had a TAVR procedure.  She has had AVMs that were noted.  This was about 3 years ago.  She continues have problems with anemia.  She has had she was anemic since she was 80 years old.  She has not noted any obvious melena or bright red blood per rectum.  She has had no weight loss.  She does have diabetes.  She has had diabetes for quite a while.  She is on insulin.  She does not smoke.  She does not drink.  She is retired.  She used to clean houses.  She just feels weak.  She just feels fatigued.  She has no energy.  She said that she just does not want to live this way and exist.  Her daughter comes with her.  Her daughter is actually an ICU nurse at [employer].  She also does some work up in [place], [place].  Taylor Jenkins has had no fever.  She has had no problems with the COVID.  She says she began to feel poorly after having the vaccine I think in October or November.  Last lab work that we have on her back in June 2021 shows white cell count 7.4.  Hemoglobin 10.1.  Platelet count 160,000.  MCV was 103.  In January 2020 when her hemoglobin was 13.7 with a hematocrit of 42.7.  MCV was 90.  Her last iron studies before today were back in March 2020.  Her ferritin was 152 with an iron saturation of 16%.  Today, her ferritin was only 67 with an iron saturation of 11%.  She does have this skin rash.  She has these macular type lesions.  She says her skin itches.  This been going on for a couple years.  Currently, her performance status is by ECOG 2-3.    Past Medical History:  Diagnosis Date  . Anemia   . Aortic atherosclerosis ([facility]) 04/29/2017  . Asthma   . Atrial  fibrillation, persistent ([facility])   . CAD (coronary artery disease) 2016   non-obstructive at cath  . Chronic diastolic CHF (congestive heart failure) ([facility])   . Diabetes mellitus without complication ([facility])    type 2  . HLD (hyperlipidemia)   . Hypertension   . S/P TAVR (transcatheter aortic valve replacement)   . Severe aortic stenosis 04/26/2017  :  Past Surgical History:  Procedure Laterality Date  . LEFT HEART CATHETERIZATION WITH CORONARY ANGIOGRAM N/A 09/27/2014   Non-obstructive disease, CRF reduction recommended. Procedure: LEFT HEART CATHETERIZATION WITH CORONARY ANGIOGRAM;  Surgeon: Micheline Chapman, MD;  Location: [facility] CATH LAB;  Service: Cardiovascular;  Laterality: N/A;  . RIGHT/LEFT HEART CATH AND CORONARY ANGIOGRAPHY N/A 10/30/2018   Procedure: RIGHT/LEFT HEART CATH AND CORONARY ANGIOGRAPHY;  Surgeon: Kathleene Hazel, MD;  Location: [facility] INVASIVE CV LAB;  Service: Cardiovascular;  Laterality: N/A;  . TEE WITHOUT CARDIOVERSION N/A 11/18/2018   Procedure: TRANSESOPHAGEAL ECHOCARDIOGRAM (TEE);  Surgeon: Kathleene Hazel, MD;  Location: [facility] INVASIVE CV LAB;  Service: Open Heart Surgery;  Laterality: N/A;  . TRANSCATHETER AORTIC VALVE REPLACEMENT, TRANSFEMORAL N/A 11/18/2018   Procedure: TRANSCATHETER AORTIC VALVE REPLACEMENT, TRANSFEMORAL;  Surgeon: Clifton James,  Nile Dear, MD;  Location: [facility] INVASIVE CV LAB;  Service: Open Heart Surgery;  Laterality: N/A;  . TUBAL LIGATION    :   Current Outpatient Medications:  .  allopurinol (ZYLOPRIM) 100 MG tablet, Take 1 tablet (100 mg total) by mouth daily., Disp: 90 tablet, Rfl: 1 .  calcium carbonate (TUMS EX) 750 MG chewable tablet, Chew 1 tablet by mouth as needed for heartburn. , Disp: , Rfl:  .  ELIQUIS 5 MG TABS tablet, TAKE 1 TABLET BY MOUTH TWICE A DAY, Disp: 60 tablet, Rfl: 0 .  ferrous sulfate 325 (65 FE) MG tablet, Take 325 mg by mouth daily., Disp: , Rfl:  .  furosemide (LASIX) 20 MG tablet, Take 1 tablet (20mg ) by mouth  daily. OK to take extra tablet daily as needed, Disp: 135 tablet, Rfl: 3 .  insulin NPH Human (NOVOLIN N) 100 UNIT/ML injection, Inject 50 Units into the skin in the morning and at bedtime. , Disp: , Rfl:  .  levalbuterol (XOPENEX HFA) 45 MCG/ACT inhaler, INHALE 1 TO 2 PUFFS BY MOUTH EVERY 4 HOURS AS NEEDED FOR WHEEZE, Disp: , Rfl:  .  losartan (COZAAR) 25 MG tablet, Take 25 mg by mouth daily., Disp: , Rfl:  .  metoprolol tartrate (LOPRESSOR) 25 MG tablet, Take 1 tablet (25 mg total) by mouth 2 (two) times daily., Disp: , Rfl:  .  Multiple Vitamin (MULTIVITAMIN ADULT PO), Take 1 tablet by mouth daily., Disp: , Rfl:  .  nitroGLYCERIN (NITROSTAT) 0.4 MG SL tablet, Place 1 tablet (0.4 mg total) under the tongue every 5 (five) minutes x 3 doses as needed for chest pain (or severe SOB)., Disp: 25 tablet, Rfl: 2 .  nystatin (MYCOSTATIN/NYSTOP) powder, Apply 1 application topically once as needed (for skin iritation)., Disp: , Rfl:  .  vitamin C (ASCORBIC ACID) 250 MG tablet, Take 250 mg by mouth daily., Disp: , Rfl:  .  zinc gluconate 50 MG tablet, Take 50 mg by mouth daily., Disp: , Rfl:  .  potassium chloride (KLOR-CON) 10 MEQ tablet, Take 1 tablet (10 mEq total) by mouth daily., Disp: 90 tablet, Rfl: 3:  :  Allergies  Allergen Reactions  . Codeine Shortness Of Breath and Itching  . Epinephrine Shortness Of Breath and Palpitations  . Penicillins Anaphylaxis    Did it involve swelling of the face/tongue/throat, SOB, or low BP? Yes Did it involve sudden or severe rash/hives, skin peeling, or any reaction on the inside of your mouth or nose? No Did you need to seek medical attention at a hospital or doctor's office? Yes When did it last happen?40+ years If all above answers are "NO", may proceed with cephalosporin use.   . Prednisone Other (See Comments)    psychosis   . Statins Other (See Comments)    Myalgias   . Lidocaine Palpitations  . Sulfa Antibiotics Rash  :  Family History   Problem Relation Age of Onset  . Transient ischemic attack Mother   . Heart attack Brother 51  :  Social History   Socioeconomic History  . Marital status: Widowed    Spouse name: Not on file  . Number of children: 4  . Years of education: Not on file  . Highest education level: Not on file  Occupational History  . Occupation: Retired-Secretary  Tobacco Use  . Smoking status: Never Smoker  . Smokeless tobacco: Never Used  Vaping Use  . Vaping Use: Never used  Substance and Sexual Activity  .  Alcohol use: No    Alcohol/week: 0.0 standard drinks  . Drug use: No  . Sexual activity: Not on file  Other Topics Concern  . Not on file  Social History Narrative   Pt lives alone in [place].    Social Determinants of Health   Financial Resource Strain: Not on file  Food Insecurity: Not on file  Transportation Needs: Not on file  Physical Activity: Not on file  Stress: Not on file  Social Connections: Not on file  Intimate Partner Violence: Not on file  :  Review of Systems  Constitutional: Positive for malaise/fatigue.  HENT: Negative.   Eyes: Negative.   Respiratory: Positive for shortness of breath and wheezing.   Cardiovascular: Positive for palpitations.  Gastrointestinal: Negative.   Genitourinary: Negative.   Musculoskeletal: Positive for joint pain and myalgias.  Skin: Positive for itching and rash.  Neurological: Positive for weakness.  Endo/Heme/Allergies: Negative.   Psychiatric/Behavioral: Negative.      Exam:  This is an obese white female in no obvious distress.  She is quite fatigued.  Vital signs show temperature of 98.4.  Pulse 73.  Blood pressure 120/66.  Weight is 233 pounds.  Head and neck exam shows no scleral icterus.  Her conjunctiva are pale.  She has no adenopathy in the neck.  Thyroid is not palpable.  Lungs are with some wheezes bilaterally.  She has some decreased breath sounds bilaterally.  Cardiac exam irregular rate and rhythm.   Consistent with atrial fibrillation.  She has no murmurs.  Abdomen is obese but soft.  She has good bowel sounds.  There is no fluid wave.  There is no palpable liver or spleen tip.  Skin exam does show dry skin.  She has these areas of excoriation from itching.  She has these macular lesions which have been scratched.  Back exam shows no tenderness over the spine, ribs or hips.  Extremities shows some chronic 1+ edema in her legs.  She has decent range of motion of her joints.  Neurological exam shows no focal neurological deficits.    @IPVITALS @   Recent Labs    11/22/20 1016  WBC 10.8*  HGB 8.4*  HCT 28.5*  PLT 197   Recent Labs    11/22/20 1016  NA 141  K 4.0  CL 107  CO2 28  GLUCOSE 139*  BUN 27*  CREATININE 0.88  CALCIUM 8.9    Blood smear review: Mild anisocytosis and poikilocytosis.  She has no nucleated red blood cells.  I see no target cells.  She has no schistocytes or spherocytes.  I see no rouleaux formation.  White blood cells are normal in morphology and maturation.  There are no hypersegmented polys.  I see no immature myeloid or lymphoid cells.  Platelets are adequate in number and size.  Pathology: None    Assessment and Plan: Taylor Jenkins is a very nice 80 year old white female.  She has multiple health issues.  She clearly has marked anemia.  Again I have to believe that she is gone below erythropoietin level.  I have a hard time explaining why her reticulocyte count is on the higher side however.  I still wonder if she is not having some GI blood loss.  We did give her stool cards to take home with her.  Her daughter will make sure she does this properly.  Her iron studies are quite low.  Her iron saturation is only 11%.  As such, we have to give  her IV iron.  She will get 2 units of blood.  This will help make her feel better more quickly.  If her erythropoietin level is abnormally low, then we will plan for ESA to be given.  I explained what ESA is to  she and her daughter.  Again, her daughter who is an ICU nurse understands all this quite well.  We just want Taylor Jenkins to feel better.  I think we can get her blood count up and she will feel better and feel more active.  We will plan to get her back in a month.

## 2020-11-22 NOTE — Telephone Encounter (Signed)
appts made per 11/22/20 los and pt will gain appts at 11/23/20 appt   Taylor Jenkins

## 2020-11-22 NOTE — Addendum Note (Signed)
Addended by: Josph Macho on: 11/22/2020 01:46 PM   Modules accepted: Orders

## 2020-11-23 ENCOUNTER — Ambulatory Visit: Payer: Medicare HMO

## 2020-11-23 ENCOUNTER — Inpatient Hospital Stay: Payer: Medicare HMO

## 2020-11-23 VITALS — BP 110/55 | HR 73 | Temp 98.2°F | Resp 18

## 2020-11-23 DIAGNOSIS — Z5189 Encounter for other specified aftercare: Secondary | ICD-10-CM

## 2020-11-23 DIAGNOSIS — D6869 Other thrombophilia: Secondary | ICD-10-CM

## 2020-11-23 DIAGNOSIS — I4819 Other persistent atrial fibrillation: Secondary | ICD-10-CM

## 2020-11-23 DIAGNOSIS — D500 Iron deficiency anemia secondary to blood loss (chronic): Secondary | ICD-10-CM

## 2020-11-23 DIAGNOSIS — I5032 Chronic diastolic (congestive) heart failure: Secondary | ICD-10-CM

## 2020-11-23 DIAGNOSIS — J810 Acute pulmonary edema: Secondary | ICD-10-CM

## 2020-11-23 DIAGNOSIS — K31811 Angiodysplasia of stomach and duodenum with bleeding: Secondary | ICD-10-CM

## 2020-11-23 LAB — ERYTHROPOIETIN: Erythropoietin: 316.8 m[IU]/mL — ABNORMAL HIGH (ref 2.6–18.5)

## 2020-11-23 LAB — PREPARE RBC (CROSSMATCH)

## 2020-11-23 MED ORDER — ACETAMINOPHEN 325 MG PO TABS
650.0000 mg | ORAL_TABLET | Freq: Once | ORAL | Status: AC
  Administered 2020-11-23: 650 mg via ORAL

## 2020-11-23 MED ORDER — ACETAMINOPHEN 325 MG PO TABS
ORAL_TABLET | ORAL | Status: AC
  Filled 2020-11-23: qty 2

## 2020-11-23 MED ORDER — SODIUM CHLORIDE 0.9% IV SOLUTION
250.0000 mL | Freq: Once | INTRAVENOUS | Status: AC
  Administered 2020-11-23: 250 mL via INTRAVENOUS
  Filled 2020-11-23: qty 250

## 2020-11-23 MED ORDER — SODIUM CHLORIDE 0.9 % IV SOLN
400.0000 mg | Freq: Once | INTRAVENOUS | Status: AC
  Administered 2020-11-23: 400 mg via INTRAVENOUS
  Filled 2020-11-23: qty 20

## 2020-11-23 MED ORDER — DIPHENHYDRAMINE HCL 25 MG PO CAPS
25.0000 mg | ORAL_CAPSULE | Freq: Once | ORAL | Status: AC
  Administered 2020-11-23: 25 mg via ORAL

## 2020-11-23 MED ORDER — DIPHENHYDRAMINE HCL 25 MG PO CAPS
ORAL_CAPSULE | ORAL | Status: AC
  Filled 2020-11-23: qty 1

## 2020-11-23 NOTE — Progress Notes (Signed)
PA okay for Venofer today per Tara Green, Financial Advocate. 

## 2020-11-23 NOTE — Patient Instructions (Signed)
Iron Sucrose injection What is this medicine? IRON SUCROSE (AHY ern SOO krohs) is an iron complex. Iron is used to make healthy red blood cells, which carry oxygen and nutrients throughout the body. This medicine is used to treat iron deficiency anemia in people with chronic kidney disease. This medicine may be used for other purposes; ask your health care provider or pharmacist if you have questions. COMMON BRAND NAME(S): Venofer What should I tell my health care provider before I take this medicine? They need to know if you have any of these conditions:  anemia not caused by low iron levels  heart disease  high levels of iron in the blood  kidney disease  liver disease  an unusual or allergic reaction to iron, other medicines, foods, dyes, or preservatives  pregnant or trying to get pregnant  breast-feeding How should I use this medicine? This medicine is for infusion into a vein. It is given by a health care professional in a hospital or clinic setting. Talk to your pediatrician regarding the use of this medicine in children. While this drug may be prescribed for children as young as 2 years for selected conditions, precautions do apply. Overdosage: If you think you have taken too much of this medicine contact a poison control center or emergency room at once. NOTE: This medicine is only for you. Do not share this medicine with others. What if I miss a dose? It is important not to miss your dose. Call your doctor or health care professional if you are unable to keep an appointment. What may interact with this medicine? Do not take this medicine with any of the following medications:  deferoxamine  dimercaprol  other iron products This medicine may also interact with the following medications:  chloramphenicol  deferasirox This list may not describe all possible interactions. Give your health care provider a list of all the medicines, herbs, non-prescription drugs, or  dietary supplements you use. Also tell them if you smoke, drink alcohol, or use illegal drugs. Some items may interact with your medicine. What should I watch for while using this medicine? Visit your doctor or healthcare professional regularly. Tell your doctor or healthcare professional if your symptoms do not start to get better or if they get worse. You may need blood work done while you are taking this medicine. You may need to follow a special diet. Talk to your doctor. Foods that contain iron include: whole grains/cereals, dried fruits, beans, or peas, leafy green vegetables, and organ meats (liver, kidney). What side effects may I notice from receiving this medicine? Side effects that you should report to your doctor or health care professional as soon as possible:  allergic reactions like skin rash, itching or hives, swelling of the face, lips, or tongue  breathing problems  changes in blood pressure  cough  fast, irregular heartbeat  feeling faint or lightheaded, falls  fever or chills  flushing, sweating, or hot feelings  joint or muscle aches/pains  seizures  swelling of the ankles or feet  unusually weak or tired Side effects that usually do not require medical attention (report to your doctor or health care professional if they continue or are bothersome):  diarrhea  feeling achy  headache  irritation at site where injected  nausea, vomiting  stomach upset  tiredness This list may not describe all possible side effects. Call your doctor for medical advice about side effects. You may report side effects to FDA at 1-800-FDA-1088. Where should I keep   my medicine? This drug is given in a hospital or clinic and will not be stored at home. NOTE: This sheet is a summary. It may not cover all possible information. If you have questions about this medicine, talk to your doctor, pharmacist, or health care provider.  2021 Elsevier/Gold Standard (2011-05-31  17:14:35) https://www.redcrossblood.org/donate-blood/blood-donation-process/what-happens-to-donated-blood/blood-transfusions/types-of-blood-transfusions.html"> https://www.hematology.org/education/patients/blood-basics/blood-safety-and-matching"> https://www.nhlbi.nih.gov/health-topics/blood-transfusion">  Blood Transfusion, Adult A blood transfusion is a procedure in which you receive blood or a type of blood cell (blood component) through an IV. You may need a blood transfusion when your blood level is low. This may result from a bleeding disorder, illness, injury, or surgery. The blood may come from a donor. You may also be able to donate blood for yourself (autologous blood donation) before a planned surgery. The blood given in a transfusion is made up of different blood components. You may receive:  Red blood cells. These carry oxygen to the cells in the body.  Platelets. These help your blood to clot.  Plasma. This is the liquid part of your blood. It carries proteins and other substances throughout the body.  White blood cells. These help you fight infections. If you have hemophilia or another clotting disorder, you may also receive other types of blood products. Tell a health care provider about:  Any blood disorders you have.  Any previous reactions you have had during a blood transfusion.  Any allergies you have.  All medicines you are taking, including vitamins, herbs, eye drops, creams, and over-the-counter medicines.  Any surgeries you have had.  Any medical conditions you have, including any recent fever or cold symptoms.  Whether you are pregnant or may be pregnant. What are the risks? Generally, this is a safe procedure. However, problems may occur.  The most common problems include: ? A mild allergic reaction, such as red, swollen areas of skin (hives) and itching. ? Fever or chills. This may be the body's response to new blood cells received. This may occur during  or up to 4 hours after the transfusion.  More serious problems may include: ? Transfusion-associated circulatory overload (TACO), or too much fluid in the lungs. This may cause breathing problems. ? A serious allergic reaction, such as difficulty breathing or swelling around the face and lips. ? Transfusion-related acute lung injury (TRALI), which causes breathing difficulty and low oxygen in the blood. This can occur within hours of the transfusion or several days later. ? Iron overload. This can happen after receiving many blood transfusions over a period of time. ? Infection or virus being transmitted. This is rare because donated blood is carefully tested before it is given. ? Hemolytic transfusion reaction. This is rare. It happens when your body's defense system (immune system)tries to attack the new blood cells. Symptoms may include fever, chills, nausea, low blood pressure, and low back or chest pain. ? Transfusion-associated graft-versus-host disease (TAGVHD). This is rare. It happens when donated cells attack your body's healthy tissues. What happens before the procedure? Medicines Ask your health care provider about:  Changing or stopping your regular medicines. This is especially important if you are taking diabetes medicines or blood thinners.  Taking medicines such as aspirin and ibuprofen. These medicines can thin your blood. Do not take these medicines unless your health care provider tells you to take them.  Taking over-the-counter medicines, vitamins, herbs, and supplements. General instructions  Follow instructions from your health care provider about eating and drinking restrictions.  You will have a blood test to determine your blood type. This  is necessary to know what kind of blood your body will accept and to match it to the donor blood.  If you are going to have a planned surgery, you may be able to do an autologous blood donation. This may be done in case you need  to have a transfusion.  You will have your temperature, blood pressure, and pulse monitored before the transfusion.  If you have had an allergic reaction to a transfusion in the past, you may be given medicine to help prevent a reaction. This medicine may be given to you by mouth (orally) or through an IV.  Set aside time for the blood transfusion. This procedure generally takes 1-4 hours to complete. What happens during the procedure?  An IV will be inserted into one of your veins.  The bag of donated blood will be attached to your IV. The blood will then enter through your vein.  Your temperature, blood pressure, and pulse will be monitored regularly during the transfusion. This monitoring is done to detect early signs of a transfusion reaction.  Tell your nurse right away if you have any of these symptoms during the transfusion: ? Shortness of breath or trouble breathing. ? Chest or back pain. ? Fever or chills. ? Hives or itching.  If you have any signs or symptoms of a reaction, your transfusion will be stopped and you may be given medicine.  When the transfusion is complete, your IV will be removed.  Pressure may be applied to the IV site for a few minutes.  A bandage (dressing)will be applied. The procedure may vary among health care providers and hospitals.   What happens after the procedure?  Your temperature, blood pressure, pulse, breathing rate, and blood oxygen level will be monitored until you leave the hospital or clinic.  Your blood may be tested to see how you are responding to the transfusion.  You may be warmed with fluids or blankets to maintain a normal body temperature.  If you receive your blood transfusion in an outpatient setting, you will be told whom to contact to report any reactions. Where to find more information For more information on blood transfusions, visit the American Red Cross: redcross.org Summary  A blood transfusion is a procedure in  which you receive blood or a type of blood cell (blood component) through an IV.  The blood you receive may come from a donor or be donated by yourself (autologous blood donation) before a planned surgery.  The blood given in a transfusion is made up of different blood components. You may receive red blood cells, platelets, plasma, or white blood cells depending on the condition treated.  Your temperature, blood pressure, and pulse will be monitored before, during, and after the transfusion.  After the transfusion, your blood may be tested to see how your body has responded. This information is not intended to replace advice given to you by your health care provider. Make sure you discuss any questions you have with your health care provider. Document Revised: 06/25/2019 Document Reviewed: 02/12/2019 Elsevier Patient Education  2021 [employer].

## 2020-11-24 ENCOUNTER — Other Ambulatory Visit: Payer: Self-pay | Admitting: Hematology & Oncology

## 2020-11-24 LAB — BPAM RBC
Blood Product Expiration Date: 202204192359
Blood Product Expiration Date: 202204192359
ISSUE DATE / TIME: 202203230740
ISSUE DATE / TIME: 202203230740
Unit Type and Rh: 5100
Unit Type and Rh: 5100

## 2020-11-24 LAB — TYPE AND SCREEN
ABO/RH(D): O POS
Antibody Screen: NEGATIVE
Unit division: 0
Unit division: 0

## 2020-11-29 ENCOUNTER — Telehealth: Payer: Self-pay | Admitting: Internal Medicine

## 2020-11-29 NOTE — Telephone Encounter (Signed)
   Primary Cardiologist: Dr. Rennis Golden, MD  Chart reviewed as part of pre-operative protocol coverage.   Simple dental extractions are considered low risk procedures per guidelines and generally do not require any specific cardiac clearance. It is also generally accepted that for simple extractions and dental cleanings, there is no need to interrupt blood thinner therapy. However if more complex procedure is indicated, will need to resend to pre-op for holding recommendations for Eliquis.   SBE prophylaxis is required for the patient from a cardiac standpoint. She will need Clindamycin 600mg  PO x1 30-60 minutes prior to dental work.   I will route this recommendation to the requesting party via Epic fax function and remove from pre-op pool.  Please call with questions.  , NP 11/29/2020, 1:06 PM

## 2020-11-29 NOTE — Telephone Encounter (Signed)
1. What dental office are you calling from? Dr Zara Chess   2. What is your office phone number? [phone]   3. What is your fax number? [phone]  4. What type of procedure is the patient having performed? cleaning   5. What date is procedure scheduled or is the patient there now? 11-30-20  (if the patient is at the dentist's office question goes to their cardiologist if he/she is in the office.  If not, question should go to the DOD).   6. What is your question (ex. Antibiotics prior to procedure, holding medication-we need to know how long dentist wants pt to hold med)? Does pt need antibiotic before cleaning, also  What will she need to do if she needs extensive dental work

## 2020-12-03 ENCOUNTER — Other Ambulatory Visit: Payer: Self-pay | Admitting: Internal Medicine

## 2020-12-03 DIAGNOSIS — I4819 Other persistent atrial fibrillation: Secondary | ICD-10-CM

## 2020-12-22 ENCOUNTER — Inpatient Hospital Stay (HOSPITAL_BASED_OUTPATIENT_CLINIC_OR_DEPARTMENT_OTHER): Payer: Medicare HMO | Admitting: Hematology & Oncology

## 2020-12-22 ENCOUNTER — Inpatient Hospital Stay: Payer: Medicare HMO | Attending: Hematology & Oncology

## 2020-12-22 ENCOUNTER — Inpatient Hospital Stay: Payer: Medicare HMO

## 2020-12-22 ENCOUNTER — Encounter: Payer: Self-pay | Admitting: Hematology & Oncology

## 2020-12-22 ENCOUNTER — Encounter: Payer: Self-pay | Admitting: *Deleted

## 2020-12-22 ENCOUNTER — Telehealth: Payer: Self-pay

## 2020-12-22 ENCOUNTER — Other Ambulatory Visit: Payer: Self-pay

## 2020-12-22 VITALS — BP 143/87 | HR 74 | Temp 98.8°F | Resp 19 | Ht <= 58 in | Wt 230.0 lb

## 2020-12-22 DIAGNOSIS — D500 Iron deficiency anemia secondary to blood loss (chronic): Secondary | ICD-10-CM | POA: Diagnosis present

## 2020-12-22 DIAGNOSIS — I5032 Chronic diastolic (congestive) heart failure: Secondary | ICD-10-CM | POA: Insufficient documentation

## 2020-12-22 DIAGNOSIS — I4819 Other persistent atrial fibrillation: Secondary | ICD-10-CM | POA: Diagnosis not present

## 2020-12-22 DIAGNOSIS — E119 Type 2 diabetes mellitus without complications: Secondary | ICD-10-CM | POA: Insufficient documentation

## 2020-12-22 DIAGNOSIS — K31811 Angiodysplasia of stomach and duodenum with bleeding: Secondary | ICD-10-CM

## 2020-12-22 LAB — CMP (CANCER CENTER ONLY)
ALT: 41 U/L (ref 0–44)
AST: 43 U/L — ABNORMAL HIGH (ref 15–41)
Albumin: 4 g/dL (ref 3.5–5.0)
Alkaline Phosphatase: 131 U/L — ABNORMAL HIGH (ref 38–126)
Anion gap: 9 (ref 5–15)
BUN: 36 mg/dL — ABNORMAL HIGH (ref 8–23)
CO2: 29 mmol/L (ref 22–32)
Calcium: 10 mg/dL (ref 8.9–10.3)
Chloride: 99 mmol/L (ref 98–111)
Creatinine: 1.1 mg/dL — ABNORMAL HIGH (ref 0.44–1.00)
GFR, Estimated: 51 mL/min — ABNORMAL LOW (ref >60)
Glucose, Bld: 302 mg/dL — ABNORMAL HIGH (ref 70–99)
Potassium: 4.7 mmol/L (ref 3.5–5.1)
Sodium: 137 mmol/L (ref 135–145)
Total Bilirubin: 0.4 mg/dL (ref 0.3–1.2)
Total Protein: 7.4 g/dL (ref 6.5–8.1)

## 2020-12-22 LAB — CBC WITH DIFFERENTIAL (CANCER CENTER ONLY)
Abs Immature Granulocytes: 0.14 10*3/uL — ABNORMAL HIGH (ref 0.00–0.07)
Basophils Absolute: 0 10*3/uL (ref 0.0–0.1)
Basophils Relative: 0 %
Eosinophils Absolute: 0.4 10*3/uL (ref 0.0–0.5)
Eosinophils Relative: 4 %
HCT: 45.6 % (ref 36.0–46.0)
Hemoglobin: 13.9 g/dL (ref 12.0–15.0)
Immature Granulocytes: 2 %
Lymphocytes Relative: 14 %
Lymphs Abs: 1.3 10*3/uL (ref 0.7–4.0)
MCH: 27.4 pg (ref 26.0–34.0)
MCHC: 30.5 g/dL (ref 30.0–36.0)
MCV: 89.8 fL (ref 80.0–100.0)
Monocytes Absolute: 0.7 10*3/uL (ref 0.1–1.0)
Monocytes Relative: 8 %
Neutro Abs: 6.6 10*3/uL (ref 1.7–7.7)
Neutrophils Relative %: 72 %
Platelet Count: 204 10*3/uL (ref 150–400)
RBC: 5.08 MIL/uL (ref 3.87–5.11)
RDW: 16.6 % — ABNORMAL HIGH (ref 11.5–15.5)
WBC Count: 9.2 10*3/uL (ref 4.0–10.5)
nRBC: 0 % (ref 0.0–0.2)

## 2020-12-22 LAB — IRON AND TIBC
Iron: 57 ug/dL (ref 41–142)
Saturation Ratios: 15 % — ABNORMAL LOW (ref 21–57)
TIBC: 375 ug/dL (ref 236–444)
UIBC: 318 ug/dL (ref 120–384)

## 2020-12-22 LAB — FERRITIN: Ferritin: 142 ng/mL (ref 11–307)

## 2020-12-22 LAB — RETICULOCYTES
Immature Retic Fract: 21.8 % — ABNORMAL HIGH (ref 2.3–15.9)
RBC.: 5.09 MIL/uL (ref 3.87–5.11)
Retic Count, Absolute: 102.3 10*3/uL (ref 19.0–186.0)
Retic Ct Pct: 2 % (ref 0.4–3.1)

## 2020-12-22 NOTE — Telephone Encounter (Signed)
appts made and printed for pt per 12/22/20 los   Taylor Jenkins 

## 2020-12-22 NOTE — Progress Notes (Signed)
Hematology and Oncology Follow Up Visit  Taylor Jenkins [id number] Jan 01, 1941 80 y.o. 12/22/2020   Principle Diagnosis:   Iron deficiency anemia --patient on Eliquis/malabsorption  Current Therapy:    Venofer 40 mg IV -given on 11/23/2020     Interim History:  Ms. Taylor Jenkins is back for follow-up.  This is her second office visit.  First saw her back on 11/22/2020.  At that time, she had marked anemia.  She was markedly iron deficient.  Her ferritin was only 67.  Her iron saturation was only 11%.  We went ahead and gave her some Venofer.  This really helped quite a bit.  Her erythropoietin level was 318.  As such, I did not think that she would benefit from Aranesp/Procrit.  She feels better.  She still has problems with her diabetes.  Her blood sugar today was over 300.  She still has neuropathy.  There has been no obvious bleeding.  She has had no obvious fever.  There is no cough or shortness of breath.  Patient does have no change in bowel or bladder habits.  Is been no melena or bright red blood per rectum.  Overall, her performance status is ECOG 1.  Medications:  Current Outpatient Medications:  .  allopurinol (ZYLOPRIM) 100 MG tablet, Take 1 tablet (100 mg total) by mouth daily., Disp: 90 tablet, Rfl: 1 .  calcium carbonate (TUMS EX) 750 MG chewable tablet, Chew 1 tablet by mouth as needed for heartburn. , Disp: , Rfl:  .  ELIQUIS 5 MG TABS tablet, TAKE 1 TABLET BY MOUTH TWICE A DAY, Disp: 180 tablet, Rfl: 1 .  ferrous sulfate 325 (65 FE) MG tablet, Take 325 mg by mouth daily., Disp: , Rfl:  .  furosemide (LASIX) 20 MG tablet, Take 1 tablet (20mg ) by mouth daily. OK to take extra tablet daily as needed, Disp: 135 tablet, Rfl: 3 .  insulin NPH Human (NOVOLIN N) 100 UNIT/ML injection, Inject 50 Units into the skin in the morning and at bedtime. , Disp: , Rfl:  .  levalbuterol (XOPENEX HFA) 45 MCG/ACT inhaler, INHALE 1 TO 2 PUFFS BY MOUTH EVERY 4 HOURS AS NEEDED FOR WHEEZE, Disp: ,  Rfl:  .  losartan (COZAAR) 25 MG tablet, Take 25 mg by mouth daily., Disp: , Rfl:  .  metoprolol tartrate (LOPRESSOR) 25 MG tablet, Take 1 tablet (25 mg total) by mouth 2 (two) times daily., Disp: , Rfl:  .  Multiple Vitamin (MULTIVITAMIN ADULT PO), Take 1 tablet by mouth daily., Disp: , Rfl:  .  nystatin (MYCOSTATIN/NYSTOP) powder, Apply 1 application topically once as needed (for skin iritation)., Disp: , Rfl:  .  vitamin C (ASCORBIC ACID) 250 MG tablet, Take 250 mg by mouth daily., Disp: , Rfl:  .  zinc gluconate 50 MG tablet, Take 50 mg by mouth daily., Disp: , Rfl:  .  nitroGLYCERIN (NITROSTAT) 0.4 MG SL tablet, Place 1 tablet (0.4 mg total) under the tongue every 5 (five) minutes x 3 doses as needed for chest pain (or severe SOB). (Patient not taking: Reported on 12/22/2020), Disp: 25 tablet, Rfl: 2 .  potassium chloride (KLOR-CON) 10 MEQ tablet, Take 1 tablet (10 mEq total) by mouth daily., Disp: 90 tablet, Rfl: 3  Allergies:  Allergies  Allergen Reactions  . Codeine Shortness Of Breath and Itching  . Epinephrine Shortness Of Breath and Palpitations  . Penicillins Anaphylaxis    Did it involve swelling of the face/tongue/throat, SOB, or low BP? Yes Did it  involve sudden or severe rash/hives, skin peeling, or any reaction on the inside of your mouth or nose? No Did you need to seek medical attention at a hospital or doctor's office? Yes When did it last happen?40+ years If all above answers are "NO", may proceed with cephalosporin use.   . Prednisone Other (See Comments)    psychosis   . Statins Other (See Comments)    Myalgias   . Lidocaine Palpitations  . Sulfa Antibiotics Rash    Past Medical History, Surgical history, Social history, and Family History were reviewed and updated.  Review of Systems: Review of Systems  Constitutional: Positive for fatigue.  HENT:  Negative.   Eyes: Negative.   Respiratory: Negative.   Cardiovascular: Positive for palpitations.   Gastrointestinal: Negative.   Endocrine: Negative.   Genitourinary: Negative.    Musculoskeletal: Positive for arthralgias and myalgias.  Skin: Negative.   Neurological: Positive for numbness.  Hematological: Negative.   Psychiatric/Behavioral: Negative.     Physical Exam:  height is 4\' 9"  (1.448 m) and weight is 230 lb (104.3 kg). Her oral temperature is 98.8 F (37.1 C). Her blood pressure is 143/87 (abnormal) and her pulse is 74. Her respiration is 19 and oxygen saturation is 96%.   Wt Readings from Last 3 Encounters:  12/22/20 230 lb (104.3 kg)  11/22/20 233 lb 4 oz (105.8 kg)  06/22/20 211 lb 9.6 oz (96 kg)    Physical Exam Vitals reviewed.  HENT:     Head: Normocephalic and atraumatic.  Eyes:     Pupils: Pupils are equal, round, and reactive to light.  Cardiovascular:     Rate and Rhythm: Normal rate and regular rhythm.     Heart sounds: Normal heart sounds.     Comments: Cardiac exam shows an irregular rate and irregular rhythm consistent with atrial fibrillation.  The rate is well controlled.  She has no murmurs, rubs or bruits. Pulmonary:     Effort: Pulmonary effort is normal.     Breath sounds: Normal breath sounds.  Abdominal:     General: Bowel sounds are normal.     Palpations: Abdomen is soft.  Musculoskeletal:        General: No tenderness or deformity. Normal range of motion.     Cervical back: Normal range of motion.  Lymphadenopathy:     Cervical: No cervical adenopathy.  Skin:    General: Skin is warm and dry.     Findings: No erythema or rash.  Neurological:     Mental Status: She is alert and oriented to person, place, and time.  Psychiatric:        Behavior: Behavior normal.        Thought Content: Thought content normal.        Judgment: Judgment normal.    Lab Results  Component Value Date   WBC 9.2 12/22/2020   HGB 13.9 12/22/2020   HCT 45.6 12/22/2020   MCV 89.8 12/22/2020   PLT 204 12/22/2020     Chemistry      Component  Value Date/Time   NA 137 12/22/2020 0921   NA 140 11/18/2019 1552   K 4.7 12/22/2020 0921   CL 99 12/22/2020 0921   CO2 29 12/22/2020 0921   BUN 36 (H) 12/22/2020 0921   BUN 33 (H) 11/18/2019 1552   CREATININE 1.10 (H) 12/22/2020 0921      Component Value Date/Time   CALCIUM 10.0 12/22/2020 0921   ALKPHOS 131 (H) 12/22/2020 12/24/2020  AST 43 (H) 12/22/2020 0921   ALT 41 12/22/2020 0921   BILITOT 0.4 12/22/2020 0921      Impression and Plan: Ms. Brossart is a very charming 80 year old white female.  She has multiple medical problems.  She saw [name] because she was quite anemic.  She is iron deficient.  We gave her IV iron.  This is helped immensely.  Her hemoglobin is up 4-1/2 points.  I really believe that we can just watch her right now.  I do not think she will need any erythropoietin.  It will be interesting to check her erythropoietin level once we get her iron levels normal.  I will plan to get her back in about 3 weeks.  Thing looks good in 3 weeks, then I will plan to move her appointments out a little bit further.  I am just glad that she is doing a bit better and having little bit better quality of life.   Josph Macho, MD 4/21/202210:58 AM

## 2020-12-23 ENCOUNTER — Telehealth: Payer: Self-pay | Admitting: *Deleted

## 2020-12-23 NOTE — Telephone Encounter (Signed)
Per scheduling message 12/22/20 Marland Kitchen - called and lvm of upcoming appointment - (dose of iron)

## 2020-12-27 ENCOUNTER — Other Ambulatory Visit: Payer: Self-pay

## 2020-12-27 ENCOUNTER — Inpatient Hospital Stay: Payer: Medicare HMO

## 2020-12-27 VITALS — BP 154/70 | HR 60 | Temp 98.2°F | Resp 17

## 2020-12-27 DIAGNOSIS — K31811 Angiodysplasia of stomach and duodenum with bleeding: Secondary | ICD-10-CM

## 2020-12-27 DIAGNOSIS — D500 Iron deficiency anemia secondary to blood loss (chronic): Secondary | ICD-10-CM

## 2020-12-27 MED ORDER — SODIUM CHLORIDE 0.9 % IV SOLN
INTRAVENOUS | Status: DC
  Filled 2020-12-27: qty 250

## 2020-12-27 MED ORDER — SODIUM CHLORIDE 0.9 % IV SOLN
400.0000 mg | Freq: Once | INTRAVENOUS | Status: AC
  Administered 2020-12-27: 400 mg via INTRAVENOUS
  Filled 2020-12-27: qty 20

## 2020-12-27 NOTE — Progress Notes (Signed)
1220 patient c/o feeling hot and clamy, back of neck sweaty. Iron infusion stopped. NS hung, wide open. Patient states it may be her low blood sugar. Only had coffee and toast for breakfast. Given ginger ale and peanut butter crackers. Per Emeline Gins NP okay to resume iron in 20 minutes.

## 2020-12-27 NOTE — Patient Instructions (Signed)

## 2021-01-17 ENCOUNTER — Telehealth: Payer: Self-pay

## 2021-01-17 NOTE — Telephone Encounter (Signed)
pts daughter called to r/s her appt as she has a sinus inf, done and r/s with sarah as she has the next avail   Charmel Pronovost

## 2021-01-18 ENCOUNTER — Inpatient Hospital Stay: Payer: Medicare HMO

## 2021-01-18 ENCOUNTER — Inpatient Hospital Stay: Payer: Medicare HMO | Admitting: Hematology & Oncology

## 2021-01-24 ENCOUNTER — Encounter: Payer: Self-pay | Admitting: Family

## 2021-01-24 ENCOUNTER — Other Ambulatory Visit: Payer: Self-pay

## 2021-01-24 ENCOUNTER — Other Ambulatory Visit: Payer: Self-pay | Admitting: *Deleted

## 2021-01-24 ENCOUNTER — Inpatient Hospital Stay: Payer: Medicare HMO

## 2021-01-24 ENCOUNTER — Inpatient Hospital Stay (HOSPITAL_BASED_OUTPATIENT_CLINIC_OR_DEPARTMENT_OTHER): Payer: Medicare HMO | Admitting: Family

## 2021-01-24 ENCOUNTER — Inpatient Hospital Stay: Payer: Medicare HMO | Attending: Hematology & Oncology

## 2021-01-24 VITALS — BP 128/23 | HR 76 | Temp 98.1°F | Resp 18 | Ht <= 58 in | Wt 229.4 lb

## 2021-01-24 DIAGNOSIS — K31811 Angiodysplasia of stomach and duodenum with bleeding: Secondary | ICD-10-CM

## 2021-01-24 DIAGNOSIS — I5032 Chronic diastolic (congestive) heart failure: Secondary | ICD-10-CM | POA: Diagnosis not present

## 2021-01-24 DIAGNOSIS — D500 Iron deficiency anemia secondary to blood loss (chronic): Secondary | ICD-10-CM

## 2021-01-24 DIAGNOSIS — D649 Anemia, unspecified: Secondary | ICD-10-CM

## 2021-01-24 DIAGNOSIS — I4819 Other persistent atrial fibrillation: Secondary | ICD-10-CM | POA: Insufficient documentation

## 2021-01-24 DIAGNOSIS — E119 Type 2 diabetes mellitus without complications: Secondary | ICD-10-CM | POA: Insufficient documentation

## 2021-01-24 LAB — CBC WITH DIFFERENTIAL (CANCER CENTER ONLY)
Abs Immature Granulocytes: 0.1 10*3/uL — ABNORMAL HIGH (ref 0.00–0.07)
Basophils Absolute: 0 10*3/uL (ref 0.0–0.1)
Basophils Relative: 0 %
Eosinophils Absolute: 0.3 10*3/uL (ref 0.0–0.5)
Eosinophils Relative: 3 %
HCT: 25.7 % — ABNORMAL LOW (ref 36.0–46.0)
Hemoglobin: 7.4 g/dL — ABNORMAL LOW (ref 12.0–15.0)
Immature Granulocytes: 1 %
Lymphocytes Relative: 11 %
Lymphs Abs: 1.1 10*3/uL (ref 0.7–4.0)
MCH: 26.1 pg (ref 26.0–34.0)
MCHC: 28.8 g/dL — ABNORMAL LOW (ref 30.0–36.0)
MCV: 90.8 fL (ref 80.0–100.0)
Monocytes Absolute: 0.6 10*3/uL (ref 0.1–1.0)
Monocytes Relative: 7 %
Neutro Abs: 7.7 10*3/uL (ref 1.7–7.7)
Neutrophils Relative %: 78 %
Platelet Count: 294 10*3/uL (ref 150–400)
RBC: 2.83 MIL/uL — ABNORMAL LOW (ref 3.87–5.11)
RDW: 19.8 % — ABNORMAL HIGH (ref 11.5–15.5)
WBC Count: 9.9 10*3/uL (ref 4.0–10.5)
nRBC: 0 % (ref 0.0–0.2)

## 2021-01-24 LAB — CMP (CANCER CENTER ONLY)
ALT: 20 U/L (ref 0–44)
AST: 27 U/L (ref 15–41)
Albumin: 3.7 g/dL (ref 3.5–5.0)
Alkaline Phosphatase: 95 U/L (ref 38–126)
Anion gap: 12 (ref 5–15)
BUN: 36 mg/dL — ABNORMAL HIGH (ref 8–23)
CO2: 27 mmol/L (ref 22–32)
Calcium: 9.6 mg/dL (ref 8.9–10.3)
Chloride: 102 mmol/L (ref 98–111)
Creatinine: 1.1 mg/dL — ABNORMAL HIGH (ref 0.44–1.00)
GFR, Estimated: 51 mL/min — ABNORMAL LOW (ref >60)
Glucose, Bld: 152 mg/dL — ABNORMAL HIGH (ref 70–99)
Potassium: 3.8 mmol/L (ref 3.5–5.1)
Sodium: 141 mmol/L (ref 135–145)
Total Bilirubin: 0.5 mg/dL (ref 0.3–1.2)
Total Protein: 6.7 g/dL (ref 6.5–8.1)

## 2021-01-24 LAB — PREPARE RBC (CROSSMATCH)

## 2021-01-24 LAB — RETICULOCYTES
Immature Retic Fract: 38.7 % — ABNORMAL HIGH (ref 2.3–15.9)
RBC.: 2.8 MIL/uL — ABNORMAL LOW (ref 3.87–5.11)
Retic Count, Absolute: 215.9 10*3/uL — ABNORMAL HIGH (ref 19.0–186.0)
Retic Ct Pct: 7.7 % — ABNORMAL HIGH (ref 0.4–3.1)

## 2021-01-24 NOTE — Progress Notes (Signed)
Hematology and Oncology Follow Up Visit  Taylor Jenkins [id number] 10/14/1940 80 y.o. 01/24/2021   Principle Diagnosis:  Iron deficiency anemia --patient on Eliquis/malabsorption  Current Therapy:   IV iron as indicated  Transfusional support as needed   Interim History:  Taylor Jenkins is here today with her daughter for follow-up. She is symptomatic with weakness, fatigue, dizziness, SOB with exertion, palpitations (atrial fib) and swelling in her lower extremities.  She was diagnosed with Covid on 12/29/2020 and had several bloody noses since that time last 8-12 minutes each. She is still unsure if this was vomited or from her nose. She also notes dark tarry stools. No other obvious blood loss noted. No petechiae.  Hgb is down to 7.4, MCV 90, WBC count 9.9 and platelets 294.  She has neuropathy in her lower extremities that is described as stable/unchanged. She also noted tingling in her hands.  No falls or syncope to report.  Her appetite is ok and she is doing her best to properly hydrated. Her weight is 229 lbs.   ECOG Performance Status: 2 - Symptomatic, <50% confined to bed  Medications:  Allergies as of 01/24/2021      Reactions   Codeine Shortness Of Breath, Itching   Epinephrine Shortness Of Breath, Palpitations   Penicillins Anaphylaxis   Did it involve swelling of the face/tongue/throat, SOB, or low BP? Yes Did it involve sudden or severe rash/hives, skin peeling, or any reaction on the inside of your mouth or nose? No Did you need to seek medical attention at a hospital or doctor's office? Yes When did it last happen?40+ years If all above answers are "NO", may proceed with cephalosporin use.   Prednisone Other (See Comments)   psychosis    Statins Other (See Comments)   Myalgias   Lidocaine Palpitations   Sulfa Antibiotics Rash      Medication List       Accurate as of Jan 24, 2021  2:22 PM. If you have any questions, ask your nurse or doctor.         allopurinol 100 MG tablet Commonly known as: ZYLOPRIM Take 1 tablet (100 mg total) by mouth daily.   calcium carbonate 750 MG chewable tablet Commonly known as: TUMS EX Chew 1 tablet by mouth as needed for heartburn.   Eliquis 5 MG Tabs tablet Generic drug: apixaban TAKE 1 TABLET BY MOUTH TWICE A DAY   ferrous sulfate 325 (65 FE) MG tablet Take 325 mg by mouth daily.   furosemide 20 MG tablet Commonly known as: LASIX Take 1 tablet (20mg ) by mouth daily. OK to take extra tablet daily as needed   insulin NPH Human 100 UNIT/ML injection Commonly known as: NOVOLIN N Inject 50 Units into the skin in the morning and at bedtime.   levalbuterol 45 MCG/ACT inhaler Commonly known as: XOPENEX HFA INHALE 1 TO 2 PUFFS BY MOUTH EVERY 4 HOURS AS NEEDED FOR WHEEZE   losartan 25 MG tablet Commonly known as: COZAAR Take 25 mg by mouth daily.   metoprolol tartrate 25 MG tablet Commonly known as: LOPRESSOR Take 1 tablet (25 mg total) by mouth 2 (two) times daily.   MULTIVITAMIN ADULT PO Take 1 tablet by mouth daily.   nitroGLYCERIN 0.4 MG SL tablet Commonly known as: NITROSTAT Place 1 tablet (0.4 mg total) under the tongue every 5 (five) minutes x 3 doses as needed for chest pain (or severe SOB).   nystatin powder Commonly known as: MYCOSTATIN/NYSTOP Apply 1 application topically  once as needed (for skin iritation).   potassium chloride 10 MEQ tablet Commonly known as: KLOR-CON Take 1 tablet (10 mEq total) by mouth daily.   vitamin C 250 MG tablet Commonly known as: ASCORBIC ACID Take 250 mg by mouth daily.   zinc gluconate 50 MG tablet Take 50 mg by mouth daily.       Allergies:  Allergies  Allergen Reactions  . Codeine Shortness Of Breath and Itching  . Epinephrine Shortness Of Breath and Palpitations  . Penicillins Anaphylaxis    Did it involve swelling of the face/tongue/throat, SOB, or low BP? Yes Did it involve sudden or severe rash/hives, skin peeling, or any  reaction on the inside of your mouth or nose? No Did you need to seek medical attention at a hospital or doctor's office? Yes When did it last happen?40+ years If all above answers are "NO", may proceed with cephalosporin use.   . Prednisone Other (See Comments)    psychosis   . Statins Other (See Comments)    Myalgias   . Lidocaine Palpitations  . Sulfa Antibiotics Rash    Past Medical History, Surgical history, Social history, and Family History were reviewed and updated.  Review of Systems: All other 10 point review of systems is negative.   Physical Exam:  vitals were not taken for this visit.   Wt Readings from Last 3 Encounters:  12/22/20 230 lb (104.3 kg)  11/22/20 233 lb 4 oz (105.8 kg)  06/22/20 211 lb 9.6 oz (96 kg)    Ocular: Sclerae unicteric, pupils equal, round and reactive to light Ear-nose-throat: Oropharynx clear, dentition fair Lymphatic: No cervical, supraclavicular or axillary adenopathy Lungs no rales or rhonchi, good excursion bilaterally Heart regular rate and rhythm, no murmur appreciated Abd soft, nontender, positive bowel sounds MSK no focal spinal tenderness, no joint edema Neuro: non-focal, well-oriented, appropriate affect Breasts: Deferred   Lab Results  Component Value Date   WBC 9.9 01/24/2021   HGB 7.4 (L) 01/24/2021   HCT 25.7 (L) 01/24/2021   MCV 90.8 01/24/2021   PLT 294 01/24/2021   Lab Results  Component Value Date   FERRITIN 142 12/22/2020   IRON 57 12/22/2020   TIBC 375 12/22/2020   UIBC 318 12/22/2020   IRONPCTSAT 15 (L) 12/22/2020   Lab Results  Component Value Date   RETICCTPCT 7.7 (H) 01/24/2021   RBC 2.80 (L) 01/24/2021   No results found for: KPAFRELGTCHN, LAMBDASER, KAPLAMBRATIO No results found for: IGGSERUM, IGA, IGMSERUM No results found for: Marda Stalker, SPEI   Chemistry      Component Value Date/Time   NA 137 12/22/2020 0921   NA 140  11/18/2019 1552   K 4.7 12/22/2020 0921   CL 99 12/22/2020 0921   CO2 29 12/22/2020 0921   BUN 36 (H) 12/22/2020 0921   BUN 33 (H) 11/18/2019 1552   CREATININE 1.10 (H) 12/22/2020 0921      Component Value Date/Time   CALCIUM 10.0 12/22/2020 0921   ALKPHOS 131 (H) 12/22/2020 0921   AST 43 (H) 12/22/2020 0921   ALT 41 12/22/2020 0921   BILITOT 0.4 12/22/2020 0921       Impression and Plan: Taylor Jenkins is a very pleasant 80 yo caucasian female with iron deficiency anemia secondary to intermittent GI blood loss and malabsorption.  Her Hgb is back down to 7.4.  We will transfuse 2 units of blood on Thursday.  Urgent referral placed with GI for  further eval.   Iron studies are pending. We will also replace if needed.  Follow-up in 3-4 weeks.  They were encouraged to contact our office with any questions or concerns. We can certainly see her sooner if needed.   Emeline Gins, NP 5/24/20222:22 PM

## 2021-01-25 ENCOUNTER — Other Ambulatory Visit: Payer: Self-pay | Admitting: Family

## 2021-01-25 ENCOUNTER — Telehealth: Payer: Self-pay | Admitting: *Deleted

## 2021-01-25 LAB — FERRITIN: Ferritin: 192 ng/mL (ref 11–307)

## 2021-01-25 LAB — IRON AND TIBC
Iron: 49 ug/dL (ref 41–142)
Saturation Ratios: 12 % — ABNORMAL LOW (ref 21–57)
TIBC: 413 ug/dL (ref 236–444)
UIBC: 364 ug/dL (ref 120–384)

## 2021-01-25 NOTE — Telephone Encounter (Signed)
Per 01/24/21 los - called and lvm of upcoming appointments - mailed calendar

## 2021-01-26 ENCOUNTER — Other Ambulatory Visit: Payer: Self-pay

## 2021-01-26 ENCOUNTER — Inpatient Hospital Stay: Payer: Medicare HMO

## 2021-01-26 VITALS — BP 137/50 | HR 81 | Temp 98.7°F | Resp 18

## 2021-01-26 DIAGNOSIS — K31811 Angiodysplasia of stomach and duodenum with bleeding: Secondary | ICD-10-CM

## 2021-01-26 DIAGNOSIS — D500 Iron deficiency anemia secondary to blood loss (chronic): Secondary | ICD-10-CM | POA: Diagnosis not present

## 2021-01-26 DIAGNOSIS — D649 Anemia, unspecified: Secondary | ICD-10-CM

## 2021-01-26 MED ORDER — SODIUM CHLORIDE 0.9 % IV SOLN
Freq: Once | INTRAVENOUS | Status: AC
  Filled 2021-01-26: qty 250

## 2021-01-26 MED ORDER — DIPHENHYDRAMINE HCL 25 MG PO CAPS: 25.0000 mg | ORAL_CAPSULE | Freq: Once | ORAL | Status: DC

## 2021-01-26 MED ORDER — FUROSEMIDE 10 MG/ML IJ SOLN
20.0000 mg | Freq: Once | INTRAMUSCULAR | Status: DC
Start: 2021-01-26 — End: 2021-01-26

## 2021-01-26 MED ORDER — ACETAMINOPHEN 325 MG PO TABS
650.0000 mg | ORAL_TABLET | Freq: Once | ORAL | Status: DC
Start: 2021-01-26 — End: 2021-01-26

## 2021-01-26 MED ORDER — SODIUM CHLORIDE 0.9 % IV SOLN
200.0000 mg | Freq: Once | INTRAVENOUS | Status: AC
  Administered 2021-01-26: 200 mg via INTRAVENOUS
  Filled 2021-01-26: qty 200

## 2021-01-26 MED ORDER — SODIUM CHLORIDE 0.9% IV SOLUTION
250.0000 mL | Freq: Once | INTRAVENOUS | Status: AC
  Administered 2021-01-26: 250 mL via INTRAVENOUS
  Filled 2021-01-26: qty 250

## 2021-01-26 NOTE — Patient Instructions (Signed)

## 2021-01-27 LAB — TYPE AND SCREEN
ABO/RH(D): O POS
Antibody Screen: NEGATIVE
Unit division: 0
Unit division: 0

## 2021-01-27 LAB — BPAM RBC
Blood Product Expiration Date: 202206242359
Blood Product Expiration Date: 202206242359
ISSUE DATE / TIME: 202205260747
ISSUE DATE / TIME: 202205260747
Unit Type and Rh: 5100
Unit Type and Rh: 5100

## 2021-01-27 LAB — ERYTHROPOIETIN: Erythropoietin: 668.7 m[IU]/mL — ABNORMAL HIGH (ref 2.6–18.5)

## 2021-02-02 ENCOUNTER — Telehealth: Payer: Self-pay | Admitting: Internal Medicine

## 2021-02-02 ENCOUNTER — Other Ambulatory Visit: Payer: Self-pay | Admitting: Cardiovascular Disease

## 2021-02-02 ENCOUNTER — Other Ambulatory Visit: Payer: Self-pay | Admitting: Internal Medicine

## 2021-02-02 NOTE — Telephone Encounter (Signed)
Spoke to patient's daughter Arline Asp.She stated mother has been sob,tired,feels heart racing at times.She had covid 1 month ago.She received 2 units of blood and received iron due to low hgb and low hct.She feels like she is having atrial fib.She requested appointment with Dr.Hilty.Advised Dr.Hilty not in office.Appointment scheduled with Afib clinic 6/7 at 3:30 pm.I will make Dr.Hilty aware.

## 2021-02-02 NOTE — Telephone Encounter (Signed)
PT's daughter is calling to get her mother a appt with Hilty. Advised next available is 07/02/2021.She stated that her mother would be dead by then and it is a critical matter that she needs to be seen sooner.She also requested a phone note be sent to see where she can be worked in at

## 2021-02-02 NOTE — Telephone Encounter (Signed)
Thanks Elnita Maxwell - unfortunately, the schedule availability is limited - I think the afib clinic is a good start or given recent COVID and anemia, she may want to go to urgent care or ER for evaluation.  Dr Rexene Edison

## 2021-02-03 NOTE — Telephone Encounter (Signed)
Called patient's daughter Arline Asp left Dr.Hilty's advice on personal voice mail.

## 2021-02-07 ENCOUNTER — Ambulatory Visit (HOSPITAL_COMMUNITY)
Admission: RE | Admit: 2021-02-07 | Discharge: 2021-02-07 | Disposition: A | Discharge status: Home / Self Care | Payer: Medicare HMO | Source: Ambulatory Visit | Attending: Nurse Practitioner | Admitting: Nurse Practitioner

## 2021-02-07 ENCOUNTER — Other Ambulatory Visit: Payer: Self-pay

## 2021-02-07 ENCOUNTER — Encounter (HOSPITAL_COMMUNITY): Payer: Self-pay | Admitting: Nurse Practitioner

## 2021-02-07 VITALS — BP 130/60 | HR 77 | Ht <= 58 in | Wt 229.2 lb

## 2021-02-07 DIAGNOSIS — Z882 Allergy status to sulfonamides status: Secondary | ICD-10-CM | POA: Insufficient documentation

## 2021-02-07 DIAGNOSIS — D6869 Other thrombophilia: Secondary | ICD-10-CM

## 2021-02-07 DIAGNOSIS — I4821 Permanent atrial fibrillation: Secondary | ICD-10-CM | POA: Diagnosis not present

## 2021-02-07 DIAGNOSIS — Z8616 Personal history of COVID-19: Secondary | ICD-10-CM | POA: Insufficient documentation

## 2021-02-07 DIAGNOSIS — Z794 Long term (current) use of insulin: Secondary | ICD-10-CM | POA: Diagnosis not present

## 2021-02-07 DIAGNOSIS — R0600 Dyspnea, unspecified: Secondary | ICD-10-CM | POA: Diagnosis not present

## 2021-02-07 DIAGNOSIS — I2510 Atherosclerotic heart disease of native coronary artery without angina pectoris: Secondary | ICD-10-CM | POA: Diagnosis not present

## 2021-02-07 DIAGNOSIS — I5032 Chronic diastolic (congestive) heart failure: Secondary | ICD-10-CM | POA: Insufficient documentation

## 2021-02-07 DIAGNOSIS — R0602 Shortness of breath: Secondary | ICD-10-CM | POA: Diagnosis not present

## 2021-02-07 DIAGNOSIS — Z888 Allergy status to other drugs, medicaments and biological substances status: Secondary | ICD-10-CM | POA: Diagnosis not present

## 2021-02-07 DIAGNOSIS — Z885 Allergy status to narcotic agent status: Secondary | ICD-10-CM | POA: Diagnosis not present

## 2021-02-07 DIAGNOSIS — I350 Nonrheumatic aortic (valve) stenosis: Secondary | ICD-10-CM | POA: Diagnosis not present

## 2021-02-07 DIAGNOSIS — R053 Chronic cough: Secondary | ICD-10-CM | POA: Diagnosis not present

## 2021-02-07 DIAGNOSIS — D539 Nutritional anemia, unspecified: Secondary | ICD-10-CM | POA: Insufficient documentation

## 2021-02-07 DIAGNOSIS — I110 Hypertensive heart disease with heart failure: Secondary | ICD-10-CM | POA: Diagnosis not present

## 2021-02-07 DIAGNOSIS — Z880 Allergy status to penicillin: Secondary | ICD-10-CM | POA: Diagnosis not present

## 2021-02-07 DIAGNOSIS — I4819 Other persistent atrial fibrillation: Secondary | ICD-10-CM

## 2021-02-07 DIAGNOSIS — E119 Type 2 diabetes mellitus without complications: Secondary | ICD-10-CM | POA: Insufficient documentation

## 2021-02-07 DIAGNOSIS — Z79899 Other long term (current) drug therapy: Secondary | ICD-10-CM | POA: Insufficient documentation

## 2021-02-07 DIAGNOSIS — Z954 Presence of other heart-valve replacement: Secondary | ICD-10-CM | POA: Insufficient documentation

## 2021-02-07 DIAGNOSIS — Z7901 Long term (current) use of anticoagulants: Secondary | ICD-10-CM | POA: Insufficient documentation

## 2021-02-07 LAB — COMPREHENSIVE METABOLIC PANEL
ALT: 28 U/L (ref 0–44)
AST: 31 U/L (ref 15–41)
Albumin: 3.3 g/dL — ABNORMAL LOW (ref 3.5–5.0)
Alkaline Phosphatase: 86 U/L (ref 38–126)
Anion gap: 7 (ref 5–15)
BUN: 27 mg/dL — ABNORMAL HIGH (ref 8–23)
CO2: 26 mmol/L (ref 22–32)
Calcium: 9 mg/dL (ref 8.9–10.3)
Chloride: 107 mmol/L (ref 98–111)
Creatinine, Ser: 0.87 mg/dL (ref 0.44–1.00)
GFR, Estimated: >60 mL/min (ref >60)
Glucose, Bld: 131 mg/dL — ABNORMAL HIGH (ref 70–99)
Potassium: 4.7 mmol/L (ref 3.5–5.1)
Sodium: 140 mmol/L (ref 135–145)
Total Bilirubin: 0.8 mg/dL (ref 0.3–1.2)
Total Protein: 6.5 g/dL (ref 6.5–8.1)

## 2021-02-07 LAB — CBC
HCT: 28.4 % — ABNORMAL LOW (ref 36.0–46.0)
Hemoglobin: 8 g/dL — ABNORMAL LOW (ref 12.0–15.0)
MCH: 26.8 pg (ref 26.0–34.0)
MCHC: 28.2 g/dL — ABNORMAL LOW (ref 30.0–36.0)
MCV: 95.3 fL (ref 80.0–100.0)
Platelets: 246 10*3/uL (ref 150–400)
RBC: 2.98 MIL/uL — ABNORMAL LOW (ref 3.87–5.11)
RDW: 19.8 % — ABNORMAL HIGH (ref 11.5–15.5)
WBC: 8.7 10*3/uL (ref 4.0–10.5)
nRBC: 0 % (ref 0.0–0.2)

## 2021-02-07 LAB — TSH: TSH: 2.144 u[IU]/mL (ref 0.350–4.500)

## 2021-02-07 LAB — BRAIN NATRIURETIC PEPTIDE: B Natriuretic Peptide: 389.4 pg/mL — ABNORMAL HIGH (ref 0.0–100.0)

## 2021-02-07 MED ORDER — LOSARTAN POTASSIUM 25 MG PO TABS: 25.0000 mg | ORAL_TABLET | Freq: Every day | ORAL | Status: DC

## 2021-02-07 NOTE — Progress Notes (Signed)
Primary Care Physician: Taylor Fuller, MD Referring Physician:Dr.  [name] is a 80 y.o. female with a h/o aortic stenosis, status post TAVR in March 2020.  Other medical issues include hypertension with diastolic heart failure, insulin-dependent diabetes, permanent  atrial fibrillation on Eliquis, and a history of chronic anemia followed by a hematologist in [facility], prior AVMs clipped in 2018, and mild to moderate  nonobstructive coronary disease at catheterization February 2020.  Pt is in the afib clinic as she in the last 4 weeks has noted more weakness, exertional shortness of breath. She wanted to be evaluated if this could be her afib. She  has lived in [place] since 2018. She is rate controlled. In this period of time, she was also found to be over diuresed and overmedicated with hypotension.. She  had a lot  of her meds stopped or reduced in dosage. Her hemoglobin has recently dropped to 10, with last hemoglobin around 12. She recently had an iron infusion. Ehr symptoms have improved since her meds have been adjusted.  F/u in afib clinic, 02/07/21. She called to [employer] c/o of weakness and shortness of breath worse since having Covid early May.  She was referred here with h/o of afib but pt has lived in permanent  rate controlled afib since 2018 and is rate controlled today, so I would not expect afib to be the source of her more acute shortness of breath. She does not appear to be fluid overloaded. She can walk just short periods of time with significant shortness of breath. Prior echo showed severe thickened left ventricle. This increase in shortness of breath  is a big change for her. The shortness of breath actually preceded Covid by  a few weeks but is worse since Covid. She took a z pak and levaquin for covid but states she is still coughing up green mucus. She had some nosebleeds while  sick with Covid  but this has stopped. She also had a HGB of 7.4 in May but  received 2 units of blood and an iron infusion since then. H/H has not been rechecked. No recent change in meds, no fever/chills. She was scheduled to go  on CPAP at one time for OAS, but did not carry thru with plans. She does snore.   Today, she denies symptoms of palpitations, chest pain,  orthopnea, PND, lower extremity edema, dizziness, presyncope, syncope, or neurologic sequela.+ fatigue and shortness of breath with exertion.  The patient is tolerating medications without difficulties and is otherwise without complaint today.   Past Medical History:  Diagnosis Date  . Anemia   . Aortic atherosclerosis (HCC) 04/29/2017  . Asthma   . Atrial fibrillation, persistent (HCC)   . AVM (arteriovenous malformation) of duodenum, acquired with hemorrhage 11/22/2020  . CAD (coronary artery disease) 2016   non-obstructive at cath  . Chronic diastolic CHF (congestive heart failure) (HCC)   . Diabetes mellitus without complication (HCC)    type 2  . HLD (hyperlipidemia)   . Hypertension   . Iron deficiency anemia due to chronic blood loss 11/22/2020  . S/P TAVR (transcatheter aortic valve replacement)   . Severe aortic stenosis 04/26/2017   Past Surgical History:  Procedure Laterality Date  . LEFT HEART CATHETERIZATION WITH CORONARY ANGIOGRAM N/A 09/27/2014   Non-obstructive disease, CRF reduction recommended. Procedure: LEFT HEART CATHETERIZATION WITH CORONARY ANGIOGRAM;  Surgeon: Taylor Chapman, MD;  Location: [facility] CATH LAB;  Service: Cardiovascular;  Laterality:  N/A;  . RIGHT/LEFT HEART CATH AND CORONARY ANGIOGRAPHY N/A 10/30/2018   Procedure: RIGHT/LEFT HEART CATH AND CORONARY ANGIOGRAPHY;  Surgeon: Taylor Hazel, MD;  Location: [facility] INVASIVE CV LAB;  Service: Cardiovascular;  Laterality: N/A;  . TEE WITHOUT CARDIOVERSION N/A 11/18/2018   Procedure: TRANSESOPHAGEAL ECHOCARDIOGRAM (TEE);  Surgeon: Taylor Hazel, MD;  Location: [facility] INVASIVE CV LAB;  Service: Open Heart Surgery;   Laterality: N/A;  . TRANSCATHETER AORTIC VALVE REPLACEMENT, TRANSFEMORAL N/A 11/18/2018   Procedure: TRANSCATHETER AORTIC VALVE REPLACEMENT, TRANSFEMORAL;  Surgeon: Taylor Hazel, MD;  Location: [facility] INVASIVE CV LAB;  Service: Open Heart Surgery;  Laterality: N/A;  . TUBAL LIGATION      Current Outpatient Medications  Medication Sig Dispense Refill  . allopurinol (ZYLOPRIM) 100 MG tablet Take 1 tablet (100 mg total) by mouth daily. 90 tablet 1  . calcium carbonate (TUMS EX) 750 MG chewable tablet Chew 1 tablet by mouth as needed for heartburn.     Marland Kitchen ELIQUIS 5 MG TABS tablet TAKE 1 TABLET BY MOUTH TWICE A DAY 180 tablet 1  . ferrous sulfate 325 (65 FE) MG tablet Take 325 mg by mouth daily.    . furosemide (LASIX) 40 MG tablet Take 1 tablet by mouth daily.    . insulin NPH Human (NOVOLIN N) 100 UNIT/ML injection Inject 50 Units into the skin in the morning and at bedtime.     . levalbuterol (XOPENEX HFA) 45 MCG/ACT inhaler INHALE 1 TO 2 PUFFS BY MOUTH EVERY 4 HOURS AS NEEDED FOR WHEEZE    . metoprolol tartrate (LOPRESSOR) 25 MG tablet Take 1 tablet (25 mg total) by mouth 2 (two) times daily.    . Multiple Vitamin (MULTIVITAMIN ADULT PO) Take 1 tablet by mouth daily.    . nitroGLYCERIN (NITROSTAT) 0.4 MG SL tablet Place 1 tablet (0.4 mg total) under the tongue every 5 (five) minutes x 3 doses as needed for chest pain (or severe SOB). 25 tablet 2  . nystatin (MYCOSTATIN/NYSTOP) powder Apply 1 application topically once as needed (for skin iritation).    . potassium chloride (KLOR-CON) 10 MEQ tablet Take 1 tablet (10 mEq total) by mouth daily. 90 tablet 3  . vitamin C (ASCORBIC ACID) 250 MG tablet Take 250 mg by mouth daily.    Marland Kitchen zinc gluconate 50 MG tablet Take 50 mg by mouth daily.    Marland Kitchen losartan (COZAAR) 25 MG tablet Take 1 tablet (25 mg total) by mouth daily.     No current facility-administered medications for this encounter.    Allergies  Allergen Reactions  . Codeine Shortness Of  Breath and Itching  . Epinephrine Shortness Of Breath and Palpitations  . Penicillins Anaphylaxis    Did it involve swelling of the face/tongue/throat, SOB, or low BP? Yes Did it involve sudden or severe rash/hives, skin peeling, or any reaction on the inside of your mouth or nose? No Did you need to seek medical attention at a hospital or doctor's office? Yes When did it last happen?40+ years If all above answers are "NO", may proceed with cephalosporin use.   . Prednisone Other (See Comments)    psychosis   . Statins Other (See Comments)    Myalgias   . Lidocaine Palpitations  . Sulfa Antibiotics Rash    Social History   Socioeconomic History  . Marital status: Widowed    Spouse name: Not on file  . Number of children: 4  . Years of education: Not on file  . Highest  education level: Not on file  Occupational History  . Occupation: Retired-Secretary  Tobacco Use  . Smoking status: Never Smoker  . Smokeless tobacco: Never Used  Vaping Use  . Vaping Use: Never used  Substance and Sexual Activity  . Alcohol use: No    Alcohol/week: 0.0 standard drinks  . Drug use: No  . Sexual activity: Not on file  Other Topics Concern  . Not on file  Social History Narrative   Pt lives alone in [place].    Social Determinants of Health   Financial Resource Strain: Not on file  Food Insecurity: Not on file  Transportation Needs: Not on file  Physical Activity: Not on file  Stress: Not on file  Social Connections: Not on file  Intimate Partner Violence: Not on file    Family History  Problem Relation Age of Onset  . Transient ischemic attack Mother   . Heart attack Brother 47    ROS- All systems are reviewed and negative except as per the HPI above  Physical Exam: Vitals:   02/07/21 1530  BP: 130/60  Pulse: 77  Weight: 104 kg  Height: 4\' 9"  (1.448 m)   Wt Readings from Last 3 Encounters:  02/07/21 104 kg  01/24/21 104.1 kg  12/22/20 104.3 kg     Labs: Lab Results  Component Value Date   NA 141 01/24/2021   K 3.8 01/24/2021   CL 102 01/24/2021   CO2 27 01/24/2021   GLUCOSE 152 (H) 01/24/2021   BUN 36 (H) 01/24/2021   CREATININE 1.10 (H) 01/24/2021   CALCIUM 9.6 01/24/2021   MG 2.1 11/19/2018   Lab Results  Component Value Date   INR 1.1 11/17/2018   Lab Results  Component Value Date   CHOL 219 (H) 09/26/2014   HDL 43 09/26/2014   LDLCALC 130 (H) 09/26/2014   TRIG 231 (H) 09/26/2014     GEN- The patient is well appearing, alert and oriented x 3 today.   Head- normocephalic, atraumatic Eyes-  Sclera clear, conjunctiva pink Ears- hearing intact Oropharynx- clear Neck- supple, no JVP Lymph- no cervical lymphadenopathy Lungs- Clear to ausculation bilaterally, normal work of breathing Heart- irregular rate and rhythm, no  rubs or gallops, PMI not laterally displaced, 2/6 systolic  murmur  GI- soft, NT, ND, + BS Extremities- no clubbing, cyanosis, or  Mild edema MS- no significant deformity or atrophy Skin- no rash or lesion Psych- euthymic mood, full affect Neuro- strength and sensation are intact  EKG-afib at 77 bpm, qrs int 70 ms, qtc 452 ms     Assessment and Plan: 1. Permanent afib Present since 2018 She is rate controlled Continue metoprolol tartrate 25 mg bid  I do not believe this is the issue for her shortness of breath which has been more acute over the last  1-2  months   2. Acute on chronic exertional shortness of breath  Worse since Covid first of May  Still has productive cough Will order CXR today  TSH/ CMET   3. Normocytic normochromic anemia Recently had transfusion in May for HGB of 7.4  Will recheck CBC  4. Mild to  moderate CAD by [facility] 2020  If other tests do not show any findings to explain her symptoms may need repeat cath   5.  [id number] score of at least 7 Continue eliquis 5 mg bid Nosebleeds during Covid have resolved   6. Diastolic dysfunction / CHF Repeat echo  for valvular disease/ diastolic dysfunction  Continue  lasix BNP today Does not appear fluid overloaded and weight is stable compared to last weight in EPIC  4. TAVR 2020 Per structural heart  Echo pending     I will let her know of outcome of tests as they come in Will need f/u with Dr. Sloan Leiter if tests do  not explain  exertional dyspnea   Elvina Sidle. Matthew Folks [facility] [address] [place], [place] [address] [phone]

## 2021-02-08 ENCOUNTER — Telehealth: Payer: Self-pay | Admitting: *Deleted

## 2021-02-08 NOTE — Telephone Encounter (Signed)
Message received from patient's daughter Taylor Jenkins to inform Dr. Myna Hidalgo that pt went to the A Fib clinic yesterday, 02/07/21 and has a HGB of 8.0.  Misty would like to know if pt needs to be seen sooner than next scheduled appt on 03/01/21.  Pt.'s chart reviewed by S. [place].  Call placed back to [facility] and [place] notified per order of S. [place] NP that it is too soon to recheck iron levels, d/t pt.'s last dose of iron was on 01/26/21.  Misty is appreciative of call back and has no further questions at this time.

## 2021-02-21 ENCOUNTER — Ambulatory Visit: Payer: Medicare HMO | Admitting: Family

## 2021-02-21 ENCOUNTER — Other Ambulatory Visit: Payer: Medicare HMO

## 2021-02-27 ENCOUNTER — Ambulatory Visit: Payer: Medicare HMO | Admitting: General Practice

## 2021-02-28 ENCOUNTER — Other Ambulatory Visit: Payer: Medicare HMO

## 2021-02-28 ENCOUNTER — Ambulatory Visit: Payer: Medicare HMO | Admitting: Family

## 2021-03-01 ENCOUNTER — Encounter: Payer: Self-pay | Admitting: Family

## 2021-03-01 ENCOUNTER — Inpatient Hospital Stay (HOSPITAL_BASED_OUTPATIENT_CLINIC_OR_DEPARTMENT_OTHER): Payer: Medicare HMO | Admitting: Family

## 2021-03-01 ENCOUNTER — Inpatient Hospital Stay: Payer: Medicare HMO | Attending: Hematology & Oncology

## 2021-03-01 ENCOUNTER — Other Ambulatory Visit: Payer: Self-pay | Admitting: Family

## 2021-03-01 ENCOUNTER — Inpatient Hospital Stay: Payer: Medicare HMO

## 2021-03-01 ENCOUNTER — Other Ambulatory Visit: Payer: Self-pay

## 2021-03-01 VITALS — BP 136/55 | HR 97 | Temp 99.0°F | Resp 20 | Ht <= 58 in

## 2021-03-01 VITALS — BP 109/56 | HR 68 | Temp 98.0°F | Resp 18

## 2021-03-01 DIAGNOSIS — D649 Anemia, unspecified: Secondary | ICD-10-CM

## 2021-03-01 DIAGNOSIS — K31811 Angiodysplasia of stomach and duodenum with bleeding: Secondary | ICD-10-CM

## 2021-03-01 DIAGNOSIS — D500 Iron deficiency anemia secondary to blood loss (chronic): Secondary | ICD-10-CM

## 2021-03-01 DIAGNOSIS — I4819 Other persistent atrial fibrillation: Secondary | ICD-10-CM | POA: Insufficient documentation

## 2021-03-01 DIAGNOSIS — K909 Intestinal malabsorption, unspecified: Secondary | ICD-10-CM | POA: Diagnosis not present

## 2021-03-01 DIAGNOSIS — I5032 Chronic diastolic (congestive) heart failure: Secondary | ICD-10-CM | POA: Insufficient documentation

## 2021-03-01 DIAGNOSIS — E119 Type 2 diabetes mellitus without complications: Secondary | ICD-10-CM | POA: Diagnosis not present

## 2021-03-01 LAB — IRON AND TIBC
Iron: 25 ug/dL — ABNORMAL LOW (ref 28–170)
Saturation Ratios: 5 % — ABNORMAL LOW (ref 10.4–31.8)
TIBC: 504 ug/dL — ABNORMAL HIGH (ref 250–450)
UIBC: 479 ug/dL

## 2021-03-01 LAB — RETICULOCYTES
Immature Retic Fract: 26.1 % — ABNORMAL HIGH (ref 2.3–15.9)
RBC.: 2.7 MIL/uL — ABNORMAL LOW (ref 3.87–5.11)
Retic Count, Absolute: 80.5 10*3/uL (ref 19.0–186.0)
Retic Ct Pct: 3 % (ref 0.4–3.1)

## 2021-03-01 LAB — CMP (CANCER CENTER ONLY)
ALT: 14 U/L (ref 0–44)
AST: 20 U/L (ref 15–41)
Albumin: 3.6 g/dL (ref 3.5–5.0)
Alkaline Phosphatase: 91 U/L (ref 38–126)
Anion gap: 8 (ref 5–15)
BUN: 28 mg/dL — ABNORMAL HIGH (ref 8–23)
CO2: 27 mmol/L (ref 22–32)
Calcium: 8.7 mg/dL — ABNORMAL LOW (ref 8.9–10.3)
Chloride: 102 mmol/L (ref 98–111)
Creatinine: 1.02 mg/dL — ABNORMAL HIGH (ref 0.44–1.00)
GFR, Estimated: 56 mL/min — ABNORMAL LOW (ref >60)
Glucose, Bld: 192 mg/dL — ABNORMAL HIGH (ref 70–99)
Potassium: 4.3 mmol/L (ref 3.5–5.1)
Sodium: 137 mmol/L (ref 135–145)
Total Bilirubin: 0.7 mg/dL (ref 0.3–1.2)
Total Protein: 6 g/dL — ABNORMAL LOW (ref 6.5–8.1)

## 2021-03-01 LAB — CBC WITH DIFFERENTIAL (CANCER CENTER ONLY)
Abs Immature Granulocytes: 0.04 10*3/uL (ref 0.00–0.07)
Basophils Absolute: 0 10*3/uL (ref 0.0–0.1)
Basophils Relative: 0 %
Eosinophils Absolute: 0.2 10*3/uL (ref 0.0–0.5)
Eosinophils Relative: 2 %
HCT: 22.2 % — ABNORMAL LOW (ref 36.0–46.0)
Hemoglobin: 5.9 g/dL — CL (ref 12.0–15.0)
Immature Granulocytes: 0 %
Lymphocytes Relative: 11 %
Lymphs Abs: 1 10*3/uL (ref 0.7–4.0)
MCH: 21.5 pg — ABNORMAL LOW (ref 26.0–34.0)
MCHC: 26.6 g/dL — ABNORMAL LOW (ref 30.0–36.0)
MCV: 81 fL (ref 80.0–100.0)
Monocytes Absolute: 0.9 10*3/uL (ref 0.1–1.0)
Monocytes Relative: 10 %
Neutro Abs: 6.9 10*3/uL (ref 1.7–7.7)
Neutrophils Relative %: 77 %
Platelet Count: 186 10*3/uL (ref 150–400)
RBC: 2.74 MIL/uL — ABNORMAL LOW (ref 3.87–5.11)
RDW: 22.1 % — ABNORMAL HIGH (ref 11.5–15.5)
WBC Count: 9 10*3/uL (ref 4.0–10.5)
nRBC: 0 % (ref 0.0–0.2)

## 2021-03-01 LAB — SAMPLE TO BLOOD BANK

## 2021-03-01 LAB — PREPARE RBC (CROSSMATCH)

## 2021-03-01 LAB — FERRITIN: Ferritin: 14 ng/mL (ref 11–307)

## 2021-03-01 MED ORDER — DIPHENHYDRAMINE HCL 25 MG PO CAPS
25.0000 mg | ORAL_CAPSULE | Freq: Once | ORAL | Status: AC
Start: 2021-03-01 — End: 2021-03-01
  Administered 2021-03-01: 25 mg via ORAL

## 2021-03-01 MED ORDER — ACETAMINOPHEN 325 MG PO TABS
650.0000 mg | ORAL_TABLET | Freq: Once | ORAL | Status: AC
  Administered 2021-03-01: 650 mg via ORAL

## 2021-03-01 MED ORDER — SODIUM CHLORIDE 0.9 % IV SOLN
200.0000 mg | Freq: Once | INTRAVENOUS | Status: AC
  Administered 2021-03-01: 200 mg via INTRAVENOUS
  Filled 2021-03-01: qty 200

## 2021-03-01 MED ORDER — ACETAMINOPHEN 325 MG PO TABS
ORAL_TABLET | ORAL | Status: AC
  Filled 2021-03-01: qty 2

## 2021-03-01 MED ORDER — DIPHENHYDRAMINE HCL 25 MG PO CAPS: 25.0000 mg | ORAL_CAPSULE | Freq: Once | ORAL | Status: AC

## 2021-03-01 MED ORDER — SODIUM CHLORIDE 0.9% IV SOLUTION
250.0000 mL | Freq: Once | INTRAVENOUS | Status: AC
  Filled 2021-03-01: qty 250

## 2021-03-01 MED ORDER — SODIUM CHLORIDE 0.9 % IV SOLN
Freq: Once | INTRAVENOUS | Status: AC
Start: 2021-03-01 — End: 2021-03-01
  Filled 2021-03-01: qty 250

## 2021-03-01 MED ORDER — FUROSEMIDE 10 MG/ML IJ SOLN: 20.0000 mg | Freq: Once | INTRAMUSCULAR | Status: DC

## 2021-03-01 MED ORDER — DIPHENHYDRAMINE HCL 25 MG PO CAPS
ORAL_CAPSULE | ORAL | Status: AC
  Filled 2021-03-01: qty 1

## 2021-03-01 NOTE — Patient Instructions (Addendum)
https://www.redcrossblood.org/donate-blood/blood-donation-process/what-happens-to-donated-blood/blood-transfusions/types-of-[email or web address].html"> https://www.hematology.org/education/patients/blood-basics/blood-safety-and-matching"> https://www.nhlbi.nih.gov/health-topics/blood-transfusion">  Blood Transfusion, Adult A blood transfusion is a procedure in which you receive blood or a type of blood cell (blood component) through an IV. You may need a blood transfusion when your blood level is low. This may result from a bleeding disorder, illness, injury, or surgery. The blood may come from a donor. You may also be able to donate blood for yourself (autologous blood donation) before a planned surgery. The blood given in a transfusion is made up of different blood components. You may receive: Red blood cells. These carry oxygen to the cells in the body. Platelets. These help your blood to clot. Plasma. This is the liquid part of your blood. It carries proteins and other substances throughout the body. White blood cells. These help you fight infections. If you have hemophilia or another clotting disorder, you may also receive othertypes of blood products. Tell a health care provider about: Any blood disorders you have. Any previous reactions you have had during a blood transfusion. Any allergies you have. All medicines you are taking, including vitamins, herbs, eye drops, creams, and over-the-counter medicines. Any surgeries you have had. Any medical conditions you have, including any recent fever or cold symptoms. Whether you are pregnant or may be pregnant. What are the risks? Generally, this is a safe procedure. However, problems may occur. The most common problems include: A mild allergic reaction, such as red, swollen areas of skin (hives) and itching. Fever or chills. This may be the body's response to new blood cells received. This may occur during or up to 4 hours after the  transfusion. More serious problems may include: Transfusion-associated circulatory overload (TACO), or too much fluid in the lungs. This may cause breathing problems. A serious allergic reaction, such as difficulty breathing or swelling around the face and lips. Transfusion-related acute lung injury (TRALI), which causes breathing difficulty and low oxygen in the blood. This can occur within hours of the transfusion or several days later. Iron overload. This can happen after receiving many blood transfusions over a period of time. Infection or virus being transmitted. This is rare because donated blood is carefully tested before it is given. Hemolytic transfusion reaction. This is rare. It happens when your body's defense system (immune system)tries to attack the new blood cells. Symptoms may include fever, chills, nausea, low blood pressure, and low back or chest pain. Transfusion-associated graft-versus-host disease (TAGVHD). This is rare. It happens when donated cells attack your body's healthy tissues. What happens before the procedure? Medicines Ask your health care provider about: Changing or stopping your regular medicines. This is especially important if you are taking diabetes medicines or blood thinners. Taking medicines such as aspirin and ibuprofen. These medicines can thin your blood. Do not take these medicines unless your health care provider tells you to take them. Taking over-the-counter medicines, vitamins, herbs, and supplements. General instructions Follow instructions from your health care provider about eating and drinking restrictions. You will have a blood test to determine your blood type. This is necessary to know what kind of blood your body will accept and to match it to the donor blood. If you are going to have a planned surgery, you may be able to do an autologous blood donation. This may be done in case you need to have a transfusion. You will have your temperature,  blood pressure, and pulse monitored before the transfusion. If you have had an allergic reaction to a transfusion in the past, you may be given   medicine to help prevent a reaction. This medicine may be given to you by mouth (orally) or through an IV. Set aside time for the blood transfusion. This procedure generally takes 1-4 hours to complete. What happens during the procedure?  An IV will be inserted into one of your veins. The bag of donated blood will be attached to your IV. The blood will then enter through your vein. Your temperature, blood pressure, and pulse will be monitored regularly during the transfusion. This monitoring is done to detect early signs of a transfusion reaction. Tell your nurse right away if you have any of these symptoms during the transfusion: Shortness of breath or trouble breathing. Chest or back pain. Fever or chills. Hives or itching. If you have any signs or symptoms of a reaction, your transfusion will be stopped and you may be given medicine. When the transfusion is complete, your IV will be removed. Pressure may be applied to the IV site for a few minutes. A bandage (dressing)will be applied. The procedure may vary among health care providers and hospitals. What happens after the procedure? Your temperature, blood pressure, pulse, breathing rate, and blood oxygen level will be monitored until you leave the hospital or clinic. Your blood may be tested to see how you are responding to the transfusion. You may be warmed with fluids or blankets to maintain a normal body temperature. If you receive your blood transfusion in an outpatient setting, you will be told whom to contact to report any reactions. Where to find more information For more information on blood transfusions, visit the American Red Cross: redcross.org Summary A blood transfusion is a procedure in which you receive blood or a type of blood cell (blood component) through an IV. The blood you  receive may come from a donor or be donated by yourself (autologous blood donation) before a planned surgery. The blood given in a transfusion is made up of different blood components. You may receive red blood cells, platelets, plasma, or white blood cells depending on the condition treated. Your temperature, blood pressure, and pulse will be monitored before, during, and after the transfusion. After the transfusion, your blood may be tested to see how your body has responded. This information is not intended to replace advice given to you by your health care provider. Make sure you discuss any questions you have with your healthcare provider. Document Revised: 06/25/2019 Document Reviewed: 02/12/2019 Elsevier Patient Education  2022 Elsevier Inc. [facility]  Discharge Instructions: Thank you for choosing [facility] to provide your oncology and hematology care.   If you have a lab appointment with the Cancer Center, please go directly to the Cancer Center and check in at the registration area.  Wear comfortable clothing and clothing appropriate for easy access to any Portacath or PICC line.   We strive to give you quality time with your provider. You may need to reschedule your appointment if you arrive late (15 or more minutes).  Arriving late affects you and other patients whose appointments are after yours.  Also, if you miss three or more appointments without notifying the office, you may be dismissed from the clinic at the provider's discretion.      For prescription refill requests, have your pharmacy contact our office and allow 72 hours for refills to be completed.    Today you received the following chemotherapy and/or immunotherapy agents iron, blood transfusion Blood Transfusion, Adult, Care After This sheet gives  you information about how to care for yourself after your procedure. Your doctor may also give you more specific instructions.  If youhave problems or questions, contact your doctor. What can I expect after the procedure? After the procedure, it is common to have: Bruising and soreness at the IV site. A fever or chills on the day of the procedure. This may be your body's response to the new blood cells received. A headache. Follow these instructions at home: Insertion site care     Follow instructions from your doctor about how to take care of your insertion site. This is where an IV tube was put into your vein. Make sure you: Wash your hands with soap and water before and after you change your bandage (dressing). If you cannot use soap and water, use hand sanitizer. Change your bandage as told by your doctor. Check your insertion site every day for signs of infection. Check for: Redness, swelling, or pain. Bleeding from the site. Warmth. Pus or a bad smell. General instructions Take over-the-counter and prescription medicines only as told by your doctor. Rest as told by your doctor. Go back to your normal activities as told by your doctor. Keep all follow-up visits as told by your doctor. This is important. Contact a doctor if: You have itching or red, swollen areas of skin (hives). You feel worried or nervous (anxious). You feel weak after doing your normal activities. You have redness, swelling, warmth, or pain around the insertion site. You have blood coming from the insertion site, and the blood does not stop with pressure. You have pus or a bad smell coming from the insertion site. Get help right away if: You have signs of a serious reaction. This may be coming from an allergy or the body's defense system (immune system). Signs include: Trouble breathing or shortness of breath. Swelling of the face or feeling warm (flushed). Fever or chills. Head, chest, or back pain. Dark pee (urine) or blood in the pee. Widespread rash. Fast heartbeat. Feeling dizzy or light-headed. You may receive your blood  transfusion in an outpatient setting. If so, youwill be told whom to contact to report any reactions. These symptoms may be an emergency. Do not wait to see if the symptoms will go away. Get medical help right away. Call your local emergency services (911 in the [place] Do not drive yourself to the hospital.  Bruising and soreness at the IV site are common. Check your insertion site every day for signs of infection. Rest as told by your doctor. Go back to your normal activities as told by your doctor. Get help right away if you have signs of a serious reaction. This information is not intended to replace advice given to you by your health care provider. Make sure you discuss any questions you have with your healthcare provider. Document Revised: 02/12/2019 Document Reviewed: 02/12/2019 Elsevier Patient Education  2022 [employer].

## 2021-03-01 NOTE — Progress Notes (Signed)
Hematology and Oncology Follow Up Visit  Taylor Jenkins [id number] February 03, 1941 80 y.o. 03/01/2021   Principle Diagnosis:  Iron deficiency anemia --patient on Eliquis/malabsorption   Current Therapy:        IV iron as indicated Transfusional support as needed   Interim History:  Taylor Jenkins is here today with her daughter for follow-up. She is symptomatic with fatigue, weakness, SOB and swelling in both lower extremities.  Hgb is 5.9, MCV 81, platelets 186 and WBC count 9.0. Reviewed labs with MD as well.  She has not noted any obvious blood loss. No bruising or petechiae.  She states that her phone does not show who is calling so she likely ignored the call from GI. We will work with them today to get her scheduled for an urgent new patient appointment. He daughter requested it be on a Wednesday.  No fever, chills, n/v, cough, rash, chest pain, palpitations, abdominal pain or changes in bowel or bladder habits.  Pedal pulses are 2+.  The neuropathy in her feet is unchanged.  No falls or syncope to report.  Her appetite comes and goes but she is staying well hydrated. She drinks a Glucerna daily to help supplement. Her weight is stable at 228 lbs.   ECOG Performance Status: 2 - Symptomatic, <50% confined to bed  Medications:  Allergies as of 03/01/2021       Reactions   Codeine Shortness Of Breath, Itching   Epinephrine Shortness Of Breath, Palpitations   Penicillins Anaphylaxis   Did it involve swelling of the face/tongue/throat, SOB, or low BP? Yes Did it involve sudden or severe rash/hives, skin peeling, or any reaction on the inside of your mouth or nose? No Did you need to seek medical attention at a hospital or doctor's office? Yes When did it last happen?      40+ years If all above answers are "NO", may proceed with cephalosporin use.   Prednisone Other (See Comments)   psychosis    Statins Other (See Comments)   Myalgias   Lidocaine Palpitations   Sulfa Antibiotics Rash         Medication List        Accurate as of March 01, 2021 10:44 AM. If you have any questions, ask your nurse or doctor.          allopurinol 100 MG tablet Commonly known as: ZYLOPRIM Take 1 tablet (100 mg total) by mouth daily.   calcium carbonate 750 MG chewable tablet Commonly known as: TUMS EX Chew 1 tablet by mouth as needed for heartburn.   Eliquis 5 MG Tabs tablet Generic drug: apixaban TAKE 1 TABLET BY MOUTH TWICE A DAY   ferrous sulfate 325 (65 FE) MG tablet Take 325 mg by mouth daily.   furosemide 40 MG tablet Commonly known as: LASIX Take 1 tablet by mouth daily.   insulin NPH Human 100 UNIT/ML injection Commonly known as: NOVOLIN N Inject 50 Units into the skin in the morning and at bedtime.   levalbuterol 45 MCG/ACT inhaler Commonly known as: XOPENEX HFA INHALE 1 TO 2 PUFFS BY MOUTH EVERY 4 HOURS AS NEEDED FOR WHEEZE   losartan 25 MG tablet Commonly known as: COZAAR Take 1 tablet (25 mg total) by mouth daily.   metoprolol tartrate 25 MG tablet Commonly known as: LOPRESSOR Take 1 tablet (25 mg total) by mouth 2 (two) times daily.   MULTIVITAMIN ADULT PO Take 1 tablet by mouth daily.   nitroGLYCERIN 0.4 MG SL tablet Commonly  known as: NITROSTAT Place 1 tablet (0.4 mg total) under the tongue every 5 (five) minutes x 3 doses as needed for chest pain (or severe SOB).   nystatin powder Commonly known as: MYCOSTATIN/NYSTOP Apply 1 application topically once as needed (for skin iritation).   potassium chloride 10 MEQ tablet Commonly known as: KLOR-CON Take 1 tablet (10 mEq total) by mouth daily.   vitamin C 250 MG tablet Commonly known as: ASCORBIC ACID Take 250 mg by mouth daily.   zinc gluconate 50 MG tablet Take 50 mg by mouth daily.        Allergies:  Allergies  Allergen Reactions   Codeine Shortness Of Breath and Itching   Epinephrine Shortness Of Breath and Palpitations   Penicillins Anaphylaxis    Did it involve swelling  of the face/tongue/throat, SOB, or low BP? Yes Did it involve sudden or severe rash/hives, skin peeling, or any reaction on the inside of your mouth or nose? No Did you need to seek medical attention at a hospital or doctor's office? Yes When did it last happen?      40+ years If all above answers are "NO", may proceed with cephalosporin use.    Prednisone Other (See Comments)    psychosis    Statins Other (See Comments)    Myalgias    Lidocaine Palpitations   Sulfa Antibiotics Rash    Past Medical History, Surgical history, Social history, and Family History were reviewed and updated.  Review of Systems: All other 10 point review of systems is negative.   Physical Exam:  vitals were not taken for this visit.   Wt Readings from Last 3 Encounters:  02/07/21 229 lb 3.2 oz (104 kg)  01/24/21 229 lb 6.4 oz (104.1 kg)  12/22/20 230 lb (104.3 kg)    Ocular: Sclerae unicteric, pupils equal, round and reactive to light Ear-nose-throat: Oropharynx clear, dentition fair Lymphatic: No cervical or supraclavicular adenopathy Lungs no rales or rhonchi, good excursion bilaterally Heart regular rate and rhythm, no murmur appreciated Abd soft, nontender, positive bowel sounds MSK no focal spinal tenderness, no joint edema Neuro: non-focal, well-oriented, appropriate affect Breasts: Deferred   Lab Results  Component Value Date   WBC 8.7 02/07/2021   HGB 8.0 (L) 02/07/2021   HCT 28.4 (L) 02/07/2021   MCV 95.3 02/07/2021   PLT 246 02/07/2021   Lab Results  Component Value Date   FERRITIN 192 01/24/2021   IRON 49 01/24/2021   TIBC 413 01/24/2021   UIBC 364 01/24/2021   IRONPCTSAT 12 (L) 01/24/2021   Lab Results  Component Value Date   RETICCTPCT 7.7 (H) 01/24/2021   RBC 2.98 (L) 02/07/2021   No results found for: KPAFRELGTCHN, LAMBDASER, KAPLAMBRATIO No results found for: IGGSERUM, IGA, IGMSERUM No results found for: Marda Stalker, SPEI   Chemistry      Component Value Date/Time   NA 140 02/07/2021 1629   NA 140 11/18/2019 1552   K 4.7 02/07/2021 1629   CL 107 02/07/2021 1629   CO2 26 02/07/2021 1629   BUN 27 (H) 02/07/2021 1629   BUN 33 (H) 11/18/2019 1552   CREATININE 0.87 02/07/2021 1629   CREATININE 1.10 (H) 01/24/2021 1336      Component Value Date/Time   CALCIUM 9.0 02/07/2021 1629   ALKPHOS 86 02/07/2021 1629   AST 31 02/07/2021 1629   AST 27 01/24/2021 1336   ALT 28 02/07/2021 1629   ALT 20 01/24/2021 1336  BILITOT 0.8 02/07/2021 1629   BILITOT 0.5 01/24/2021 1336       Impression and Plan: Taylor Jenkins is a very pleasant 80 yo caucasian female with iron deficiency anemia secondary to intermittent GI blood loss and malabsorption. Hgb is 5.9 and patient is symptomatic as mentioned above.  We will give IV iron and 1 unit of blood today and another unit of blood tomorrow.  GI is aware and is working on getting her scheduled on the first available Wednesday new patient appointment.  Follow-up in 3 weeks.  They can contact our office with any questions or concerns.   Emeline Gins, NP 6/29/202210:44 AM

## 2021-03-02 ENCOUNTER — Inpatient Hospital Stay: Payer: Medicare HMO

## 2021-03-02 DIAGNOSIS — D649 Anemia, unspecified: Secondary | ICD-10-CM

## 2021-03-02 DIAGNOSIS — D500 Iron deficiency anemia secondary to blood loss (chronic): Secondary | ICD-10-CM | POA: Diagnosis not present

## 2021-03-02 MED ORDER — SODIUM CHLORIDE 0.9% IV SOLUTION
250.0000 mL | Freq: Once | INTRAVENOUS | Status: AC
Start: 2021-03-02 — End: 2021-03-02
  Administered 2021-03-02: 250 mL via INTRAVENOUS
  Filled 2021-03-02: qty 250

## 2021-03-02 MED ORDER — DIPHENHYDRAMINE HCL 25 MG PO CAPS
ORAL_CAPSULE | ORAL | Status: AC
  Filled 2021-03-02: qty 1

## 2021-03-02 MED ORDER — ACETAMINOPHEN 325 MG PO TABS
ORAL_TABLET | ORAL | Status: AC
  Filled 2021-03-02: qty 2

## 2021-03-02 MED ORDER — ACETAMINOPHEN 325 MG PO TABS
650.0000 mg | ORAL_TABLET | Freq: Once | ORAL | Status: AC
  Administered 2021-03-02: 650 mg via ORAL

## 2021-03-02 MED ORDER — DIPHENHYDRAMINE HCL 25 MG PO CAPS
25.0000 mg | ORAL_CAPSULE | Freq: Once | ORAL | Status: AC
Start: 2021-03-02 — End: 2021-03-02
  Administered 2021-03-02: 25 mg via ORAL

## 2021-03-02 NOTE — Patient Instructions (Signed)
Blood Transfusion, Adult, Care After This sheet gives you information about how to care for yourself after your procedure. Your doctor may also give you more specific instructions. If youhave problems or questions, contact your doctor. What can I expect after the procedure? After the procedure, it is common to have: Bruising and soreness at the IV site. A fever or chills on the day of the procedure. This may be your body's response to the new blood cells received. A headache. Follow these instructions at home: Insertion site care     Follow instructions from your doctor about how to take care of your insertion site. This is where an IV tube was put into your vein. Make sure you: Wash your hands with soap and water before and after you change your bandage (dressing). If you cannot use soap and water, use hand sanitizer. Change your bandage as told by your doctor. Check your insertion site every day for signs of infection. Check for: Redness, swelling, or pain. Bleeding from the site. Warmth. Pus or a bad smell. General instructions Take over-the-counter and prescription medicines only as told by your doctor. Rest as told by your doctor. Go back to your normal activities as told by your doctor. Keep all follow-up visits as told by your doctor. This is important. Contact a doctor if: You have itching or red, swollen areas of skin (hives). You feel worried or nervous (anxious). You feel weak after doing your normal activities. You have redness, swelling, warmth, or pain around the insertion site. You have blood coming from the insertion site, and the blood does not stop with pressure. You have pus or a bad smell coming from the insertion site. Get help right away if: You have signs of a serious reaction. This may be coming from an allergy or the body's defense system (immune system). Signs include: Trouble breathing or shortness of breath. Swelling of the face or feeling warm  (flushed). Fever or chills. Head, chest, or back pain. Dark pee (urine) or blood in the pee. Widespread rash. Fast heartbeat. Feeling dizzy or light-headed. You may receive your blood transfusion in an outpatient setting. If so, youwill be told whom to contact to report any reactions. These symptoms may be an emergency. Do not wait to see if the symptoms will go away. Get medical help right away. Call your local emergency services (911 in the [place] Do not drive yourself to the hospital. Summary Bruising and soreness at the IV site are common. Check your insertion site every day for signs of infection. Rest as told by your doctor. Go back to your normal activities as told by your doctor. Get help right away if you have signs of a serious reaction. This information is not intended to replace advice given to you by your health care provider. Make sure you discuss any questions you have with your healthcare provider. Document Revised: 02/12/2019 Document Reviewed: 02/12/2019 Elsevier Patient Education  2022 Elsevier Inc.  

## 2021-03-03 LAB — BPAM RBC
Blood Product Expiration Date: 202208012359
Blood Product Expiration Date: 202208012359
ISSUE DATE / TIME: 202206291314
ISSUE DATE / TIME: 202206300756
Unit Type and Rh: 5100
Unit Type and Rh: 5100

## 2021-03-03 LAB — TYPE AND SCREEN
ABO/RH(D): O POS
Antibody Screen: NEGATIVE
Unit division: 0
Unit division: 0

## 2021-03-15 ENCOUNTER — Ambulatory Visit (HOSPITAL_COMMUNITY): Payer: Medicare HMO

## 2021-03-17 ENCOUNTER — Other Ambulatory Visit: Payer: Self-pay

## 2021-03-17 ENCOUNTER — Ambulatory Visit (INDEPENDENT_AMBULATORY_CARE_PROVIDER_SITE_OTHER): Payer: Medicare HMO | Admitting: Medical

## 2021-03-17 ENCOUNTER — Encounter: Payer: Self-pay | Admitting: Medical

## 2021-03-17 VITALS — BP 142/90 | HR 79 | Resp 18 | Ht <= 58 in | Wt 228.0 lb

## 2021-03-17 DIAGNOSIS — I350 Nonrheumatic aortic (valve) stenosis: Secondary | ICD-10-CM

## 2021-03-17 DIAGNOSIS — I10 Essential (primary) hypertension: Secondary | ICD-10-CM

## 2021-03-17 DIAGNOSIS — I4819 Other persistent atrial fibrillation: Secondary | ICD-10-CM

## 2021-03-17 DIAGNOSIS — I5032 Chronic diastolic (congestive) heart failure: Secondary | ICD-10-CM

## 2021-03-17 DIAGNOSIS — I2510 Atherosclerotic heart disease of native coronary artery without angina pectoris: Secondary | ICD-10-CM

## 2021-03-17 DIAGNOSIS — D500 Iron deficiency anemia secondary to blood loss (chronic): Secondary | ICD-10-CM | POA: Diagnosis not present

## 2021-03-17 DIAGNOSIS — D631 Anemia in chronic kidney disease: Secondary | ICD-10-CM | POA: Diagnosis not present

## 2021-03-17 MED ORDER — VENLAFAXINE HCL ER 37.5 MG PO CP24: 37.5000 mg | ORAL_CAPSULE | Freq: Every day | ORAL | 0 refills | Status: DC

## 2021-03-17 NOTE — Patient Instructions (Addendum)
Nice to meet you today  On review history of atrial fibrillation, aortic stenosis, coronary artery disease, CHF and hypertension.  Continue current regimen of medications prescribed by cardiologist.  Keep upcoming echocardiogram appointment.  We will review specialty notes when sent to me and review echo which will likely be sent as well.  History of anemia.  Continue to follow-up with Dr. Myna Hidalgo.  Do agree with referral to GI to make sure no gastrointestinal source of bleed contributing to worsening anemia.  History of chronic moderate to severe daily anxiety.  No depression reported.  On discussion I do think SSRI type medication will be helpful but venlafaxine might be better to treat both anxiety and possibly improve energy/reduce fatigue.  If after 2 weeks no improvement of fatigue or anxiety could increase venlafaxine dose.  Also for anxiety BuSpar is potential add-on treatment.  For history of diabetes want you to get A1c in 2 weeks.  We will see if hematologist office can add to lab draw done on day of visit with them.  If not then I will order A1c.  Hx of gout continue allopurinol. Uric acid with Dr. Myna Hidalgo or with our office.  Your fatigue is probably multifactoria anemia, age, cardiac history but possible post covid maybe playing a role as well  Follow up in 2 weeks or as needed

## 2021-03-17 NOTE — Progress Notes (Signed)
Subjective:    Patient ID: Taylor Jenkins, female    DOB: [birth year], 80 y.o.   MRN: [id number]  HPI  Pt in for first time.  Former pcp retired.  Pt had covid in spring and did get over the illness. However she still feels fatigue. Pt sees Dr. Myna Hidalgo and family thinks they will delay getting booster.  Pt has hx of a fib, aortic stenosis, cad and htn. Pt seesn Dr. Rennis Golden.  Pt is on losartan, lasix, eliquis, and potassium. Getting echo on Monday.  Pt is diabetic in past. Pt use insulin otc. Last a1c 7.5. Pt uses novolin over the counter 50 units twice a day. Sugars about 80-115. On over the counter due to issues with donut whole cost.   Pt has hx of asthma in past. Has xopenex available If needed. Pt has had a lot second hand smoke in past. Never was smoker.  Hx of gout. Pt has had no recent gout flare. Pt is on allopurinol.   Hx of fatty liver.  Pt anemia is worsening. Pt is being referred to GI to evaluate further.    Hx of anxiety. Pt GAD-7 score 19. Pt chronically anxious.   Review of Systems  Constitutional:  Positive for fatigue. Negative for chills and fever.  Respiratory:  Negative for cough, chest tightness, shortness of breath and wheezing.   Cardiovascular:  Negative for chest pain and palpitations.  Gastrointestinal:  Negative for abdominal distention, abdominal pain, anal bleeding, blood in stool, constipation, nausea and vomiting.  Genitourinary:  Negative for dysuria and frequency.  Musculoskeletal:  Negative for back pain and myalgias.  Skin:  Negative for rash.  Neurological:  Negative for dizziness and headaches.  Hematological:  Negative for adenopathy. Does not bruise/bleed easily.  Psychiatric/Behavioral:  Negative for agitation, behavioral problems, decreased concentration and dysphoric mood. The patient is nervous/anxious.      Past Medical History:  Diagnosis Date   Anemia    Aortic atherosclerosis (HCC) 04/29/2017   Asthma    Atrial fibrillation,  persistent (HCC)    AVM (arteriovenous malformation) of duodenum, acquired with hemorrhage 11/22/2020   CAD (coronary artery disease) 2016   non-obstructive at cath   Chronic diastolic CHF (congestive heart failure) (HCC)    Diabetes mellitus without complication (HCC)    type 2   HLD (hyperlipidemia)    Hypertension    Iron deficiency anemia due to chronic blood loss 11/22/2020   S/P TAVR (transcatheter aortic valve replacement)    Severe aortic stenosis 04/26/2017     Social History   Socioeconomic History   Marital status: Widowed    Spouse name: Not on file   Number of children: 4   Years of education: Not on file   Highest education level: Not on file  Occupational History   Occupation: Retired-Secretary  Tobacco Use   Smoking status: Never   Smokeless tobacco: Never  Vaping Use   Vaping Use: Never used  Substance and Sexual Activity   Alcohol use: No    Alcohol/week: 0.0 standard drinks   Drug use: No   Sexual activity: Not on file  Other Topics Concern   Not on file  Social History Narrative   Pt lives alone in [place].    Social Determinants of Health   Financial Resource Strain: Not on file  Food Insecurity: Not on file  Transportation Needs: Not on file  Physical Activity: Not on file  Stress: Not on file  Social Connections: Not on file  Intimate Partner Violence: Not on file    Past Surgical History:  Procedure Laterality Date   LEFT HEART CATHETERIZATION WITH CORONARY ANGIOGRAM N/A 09/27/2014   Non-obstructive disease, CRF reduction recommended. Procedure: LEFT HEART CATHETERIZATION WITH CORONARY ANGIOGRAM;  Surgeon: Micheline Chapman, MD;  Location: [facility] CATH LAB;  Service: Cardiovascular;  Laterality: N/A;   RIGHT/LEFT HEART CATH AND CORONARY ANGIOGRAPHY N/A 10/30/2018   Procedure: RIGHT/LEFT HEART CATH AND CORONARY ANGIOGRAPHY;  Surgeon: Kathleene Hazel, MD;  Location: [facility] INVASIVE CV LAB;  Service: Cardiovascular;  Laterality: N/A;   TEE  WITHOUT CARDIOVERSION N/A 11/18/2018   Procedure: TRANSESOPHAGEAL ECHOCARDIOGRAM (TEE);  Surgeon: Kathleene Hazel, MD;  Location: [facility] INVASIVE CV LAB;  Service: Open Heart Surgery;  Laterality: N/A;   TRANSCATHETER AORTIC VALVE REPLACEMENT, TRANSFEMORAL N/A 11/18/2018   Procedure: TRANSCATHETER AORTIC VALVE REPLACEMENT, TRANSFEMORAL;  Surgeon: Kathleene Hazel, MD;  Location: [facility] INVASIVE CV LAB;  Service: Open Heart Surgery;  Laterality: N/A;   TUBAL LIGATION      Family History  Problem Relation Age of Onset   Transient ischemic attack Mother    Heart attack Brother 32    Allergies  Allergen Reactions   Codeine Shortness Of Breath and Itching   Epinephrine Shortness Of Breath and Palpitations   Penicillins Anaphylaxis    Did it involve swelling of the face/tongue/throat, SOB, or low BP? Yes Did it involve sudden or severe rash/hives, skin peeling, or any reaction on the inside of your mouth or nose? No Did you need to seek medical attention at a hospital or doctor's office? Yes When did it last happen?      40+ years If all above answers are "NO", may proceed with cephalosporin use.    Prednisone Other (See Comments)    psychosis    Statins Other (See Comments)    Myalgias    Lidocaine Palpitations   Sulfa Antibiotics Rash    Current Outpatient Medications on File Prior to Visit  Medication Sig Dispense Refill   allopurinol (ZYLOPRIM) 100 MG tablet Take 1 tablet (100 mg total) by mouth daily. 90 tablet 1   calcium carbonate (TUMS EX) 750 MG chewable tablet Chew 1 tablet by mouth as needed for heartburn.      ELIQUIS 5 MG TABS tablet TAKE 1 TABLET BY MOUTH TWICE A DAY 180 tablet 1   ferrous sulfate 325 (65 FE) MG tablet Take 325 mg by mouth daily.     furosemide (LASIX) 40 MG tablet Take 1 tablet by mouth daily.     insulin NPH Human (NOVOLIN N) 100 UNIT/ML injection Inject 50 Units into the skin in the morning and at bedtime.      levalbuterol (XOPENEX HFA) 45  MCG/ACT inhaler INHALE 1 TO 2 PUFFS BY MOUTH EVERY 4 HOURS AS NEEDED FOR WHEEZE     losartan (COZAAR) 25 MG tablet Take 1 tablet (25 mg total) by mouth daily.     metoprolol tartrate (LOPRESSOR) 25 MG tablet Take 1 tablet (25 mg total) by mouth 2 (two) times daily.     Multiple Vitamin (MULTIVITAMIN ADULT PO) Take 1 tablet by mouth daily.     nitroGLYCERIN (NITROSTAT) 0.4 MG SL tablet Place 1 tablet (0.4 mg total) under the tongue every 5 (five) minutes x 3 doses as needed for chest pain (or severe SOB). 25 tablet 2   nystatin (MYCOSTATIN/NYSTOP) powder Apply 1 application topically once as needed (for skin iritation).     vitamin C (ASCORBIC ACID) 250 MG tablet  Take 250 mg by mouth daily.     zinc gluconate 50 MG tablet Take 50 mg by mouth daily.     potassium chloride (KLOR-CON) 10 MEQ tablet Take 1 tablet (10 mEq total) by mouth daily. 90 tablet 3   No current facility-administered medications on file prior to visit.    BP (!) 142/90   Pulse 79   Resp 18   Ht 4\' 9"  (1.448 m)   Wt 228 lb (103.4 kg) Comment: pt reported  SpO2 98%   BMI 49.34 kg/m         Objective:   Physical Exam  General Mental Status- Alert. General Appearance- Not in acute distress.   Skin General: Color- Normal Color. Moisture- Normal Moisture.  Neck Carotid Arteries- Normal color. Moisture- Normal Moisture. No carotid bruits. No JVD.  Chest and Lung Exam Auscultation: Breath Sounds:-Normal.  Cardiovascular Auscultation:Rythm- Regular. Murmurs & Other Heart Sounds:Auscultation of the heart reveals- No Murmurs.  Abdomen Inspection:-Inspeection Normal. Palpation/Percussion:Note:No mass. Palpation and Percussion of the abdomen reveal- Non Tender, Non Distended + BS, no rebound or guarding.   Neurologic Cranial Nerve exam:- CN III-XII intact(No nystagmus), symmetric smile. Strength:- 5/5 equal and symmetric strength both upper and lower extremities.   Lower ext- mild bilateral pedal edema.      Assessment & Plan:   Nice to meet you today  On review history of atrial fibrillation, aortic stenosis, coronary artery disease, CHF and hypertension.  Continue current regimen of medications prescribed by cardiologist.  Keep upcoming echocardiogram appointment.  We will review specialty notes when sent to me and review echo which will likely be sent as well.  History of anemia.  Continue to follow-up with Dr. .  Do agree with referral to GI to make sure no gastrointestinal source of bleed contributing to worsening anemia.  History of chronic moderate to severe daily anxiety.  No depression reported.  On discussion I do think SSRI type medication will be helpful but venlafaxine might be better to treat both anxiety and possibly improve energy/reduce fatigue.  If after 2 weeks no improvement of fatigue or anxiety could increase venlafaxine dose.  Also for anxiety BuSpar is potential add-on treatment.  For history of diabetes want you to get A1c in 2 weeks.  We will see if hematologist office can add to lab draw done on day of visit with them.  If not then I will order A1c.  Hx of gout continue allopurinol. Uric acid with Dr. Myna Hidalgo or with our office.  Your fatigue is probably multifactoria anemia, age, cardiac history but possible post covid maybe playing a role as well  Follow up in 2 weeks or as needed  Myna Hidalgo, PA-C   Time spent with patient today was  45 minutes which consisted of chart review, discussing diagnoses, work up ,treatment and documentation.

## 2021-03-20 ENCOUNTER — Other Ambulatory Visit: Payer: Self-pay

## 2021-03-20 ENCOUNTER — Ambulatory Visit (HOSPITAL_COMMUNITY)
Admission: RE | Admit: 2021-03-20 | Discharge: 2021-03-20 | Disposition: A | Discharge status: Home / Self Care | Payer: Medicare HMO | Source: Ambulatory Visit | Attending: Nurse Practitioner | Admitting: Nurse Practitioner

## 2021-03-20 DIAGNOSIS — I4819 Other persistent atrial fibrillation: Secondary | ICD-10-CM | POA: Diagnosis present

## 2021-03-20 MED ORDER — PERFLUTREN LIPID MICROSPHERE
1.0000 mL | INTRAVENOUS | Status: AC | PRN
  Administered 2021-03-20: 4 mL via INTRAVENOUS
  Filled 2021-03-20: qty 10

## 2021-03-22 ENCOUNTER — Inpatient Hospital Stay (HOSPITAL_BASED_OUTPATIENT_CLINIC_OR_DEPARTMENT_OTHER): Payer: Medicare HMO | Admitting: Family

## 2021-03-22 ENCOUNTER — Inpatient Hospital Stay: Payer: Medicare HMO | Attending: Hematology & Oncology

## 2021-03-22 ENCOUNTER — Encounter: Payer: Self-pay | Admitting: Family

## 2021-03-22 ENCOUNTER — Other Ambulatory Visit: Payer: Self-pay

## 2021-03-22 VITALS — BP 159/82 | HR 65 | Temp 98.4°F | Resp 20 | Ht <= 58 in | Wt 228.0 lb

## 2021-03-22 DIAGNOSIS — K922 Gastrointestinal hemorrhage, unspecified: Secondary | ICD-10-CM | POA: Diagnosis present

## 2021-03-22 DIAGNOSIS — D649 Anemia, unspecified: Secondary | ICD-10-CM | POA: Diagnosis not present

## 2021-03-22 DIAGNOSIS — K31811 Angiodysplasia of stomach and duodenum with bleeding: Secondary | ICD-10-CM

## 2021-03-22 DIAGNOSIS — D500 Iron deficiency anemia secondary to blood loss (chronic): Secondary | ICD-10-CM | POA: Diagnosis not present

## 2021-03-22 LAB — CBC WITH DIFFERENTIAL (CANCER CENTER ONLY)
Abs Immature Granulocytes: 0.03 10*3/uL (ref 0.00–0.07)
Basophils Absolute: 0 10*3/uL (ref 0.0–0.1)
Basophils Relative: 1 %
Eosinophils Absolute: 0.3 10*3/uL (ref 0.0–0.5)
Eosinophils Relative: 4 %
HCT: 32.8 % — ABNORMAL LOW (ref 36.0–46.0)
Hemoglobin: 9 g/dL — ABNORMAL LOW (ref 12.0–15.0)
Immature Granulocytes: 0 %
Lymphocytes Relative: 10 %
Lymphs Abs: 0.8 10*3/uL (ref 0.7–4.0)
MCH: 23.1 pg — ABNORMAL LOW (ref 26.0–34.0)
MCHC: 27.4 g/dL — ABNORMAL LOW (ref 30.0–36.0)
MCV: 84.1 fL (ref 80.0–100.0)
Monocytes Absolute: 0.6 10*3/uL (ref 0.1–1.0)
Monocytes Relative: 8 %
Neutro Abs: 6.1 10*3/uL (ref 1.7–7.7)
Neutrophils Relative %: 77 %
Platelet Count: 260 10*3/uL (ref 150–400)
RBC: 3.9 MIL/uL (ref 3.87–5.11)
RDW: 22.4 % — ABNORMAL HIGH (ref 11.5–15.5)
WBC Count: 7.9 10*3/uL (ref 4.0–10.5)
nRBC: 0 % (ref 0.0–0.2)

## 2021-03-22 LAB — CMP (CANCER CENTER ONLY)
ALT: 18 U/L (ref 0–44)
AST: 25 U/L (ref 15–41)
Albumin: 4.1 g/dL (ref 3.5–5.0)
Alkaline Phosphatase: 101 U/L (ref 38–126)
Anion gap: 7 (ref 5–15)
BUN: 31 mg/dL — ABNORMAL HIGH (ref 8–23)
CO2: 28 mmol/L (ref 22–32)
Calcium: 10 mg/dL (ref 8.9–10.3)
Chloride: 107 mmol/L (ref 98–111)
Creatinine: 0.88 mg/dL (ref 0.44–1.00)
GFR, Estimated: >60 mL/min (ref >60)
Glucose, Bld: 68 mg/dL — ABNORMAL LOW (ref 70–99)
Potassium: 4.1 mmol/L (ref 3.5–5.1)
Sodium: 142 mmol/L (ref 135–145)
Total Bilirubin: 0.8 mg/dL (ref 0.3–1.2)
Total Protein: 7.3 g/dL (ref 6.5–8.1)

## 2021-03-22 LAB — SAMPLE TO BLOOD BANK

## 2021-03-22 LAB — RETICULOCYTES
Immature Retic Fract: 34.1 % — ABNORMAL HIGH (ref 2.3–15.9)
RBC.: 3.92 MIL/uL (ref 3.87–5.11)
Retic Count, Absolute: 107 10*3/uL (ref 19.0–186.0)
Retic Ct Pct: 2.7 % (ref 0.4–3.1)

## 2021-03-22 LAB — SAVE SMEAR(SSMR), FOR PROVIDER SLIDE REVIEW

## 2021-03-22 NOTE — Progress Notes (Signed)
Hematology and Oncology Follow Up Visit  Taylor Jenkins [id number] March 28, 1941 80 y.o. 03/22/2021   Principle Diagnosis:  Iron deficiency anemia --patient on Eliquis/malabsorption   Current Therapy:        IV iron as indicated Transfusional support as needed   Interim History:  Taylor Jenkins is here today with her daughter for follow-up. She is feeling better since receiving Blood and IV iron earlier this month but still has fatigue and some mild SOB with exertion.  She has not noted any obvious blood loss. No bruising or petechiae.  Hgb is up to 9.0, MCV 84, platelets 260 and WBC count is 7.9.  She has an appointment with GI Dr. Barron Alvine on 04/03/2021.  No fever, chills, n/v, cough, rash, dizziness, chest pain, palpitations, abdominal pain or changes in bowel or bladder habits.  The swelling in her lower extremities has improved. Pedal pulses are 2+.  She is in a wheel chair today. No falls or syncope to report.  She has a good appetite and is doing her best to stay properly hydrated throughout the day. Her blood sugars are not well controlled at this time and she plans to reduce her carbohydrates and increase her protein intake. Her weight is stable at 228 lbs.   ECOG Performance Status: 1 - Symptomatic but completely ambulatory  Medications:  Allergies as of 03/22/2021       Reactions   Codeine Shortness Of Breath, Itching   Epinephrine Shortness Of Breath, Palpitations   Penicillins Anaphylaxis   Did it involve swelling of the face/tongue/throat, SOB, or low BP? Yes Did it involve sudden or severe rash/hives, skin peeling, or any reaction on the inside of your mouth or nose? No Did you need to seek medical attention at a hospital or doctor's office? Yes When did it last happen?      40+ years If all above answers are "NO", may proceed with cephalosporin use.   Prednisone Other (See Comments)   psychosis    Statins Other (See Comments)   Myalgias   Lidocaine Palpitations   Sulfa  Antibiotics Rash        Medication List        Accurate as of March 22, 2021  2:59 PM. If you have any questions, ask your nurse or doctor.          allopurinol 100 MG tablet Commonly known as: ZYLOPRIM Take 1 tablet (100 mg total) by mouth daily.   calcium carbonate 750 MG chewable tablet Commonly known as: TUMS EX Chew 1 tablet by mouth as needed for heartburn.   Eliquis 5 MG Tabs tablet Generic drug: apixaban TAKE 1 TABLET BY MOUTH TWICE A DAY   ferrous sulfate 325 (65 FE) MG tablet Take 325 mg by mouth daily.   furosemide 40 MG tablet Commonly known as: LASIX Take 1 tablet by mouth daily.   insulin NPH Human 100 UNIT/ML injection Commonly known as: NOVOLIN N Inject 50 Units into the skin in the morning and at bedtime.   levalbuterol 45 MCG/ACT inhaler Commonly known as: XOPENEX HFA INHALE 1 TO 2 PUFFS BY MOUTH EVERY 4 HOURS AS NEEDED FOR WHEEZE   losartan 25 MG tablet Commonly known as: COZAAR Take 1 tablet (25 mg total) by mouth daily.   metoprolol tartrate 25 MG tablet Commonly known as: LOPRESSOR Take 1 tablet (25 mg total) by mouth 2 (two) times daily.   MULTIVITAMIN ADULT PO Take 1 tablet by mouth daily.   nitroGLYCERIN 0.4 MG SL  tablet Commonly known as: NITROSTAT Place 1 tablet (0.4 mg total) under the tongue every 5 (five) minutes x 3 doses as needed for chest pain (or severe SOB).   nystatin powder Commonly known as: MYCOSTATIN/NYSTOP Apply 1 application topically once as needed (for skin iritation).   potassium chloride 10 MEQ tablet Commonly known as: KLOR-CON Take 1 tablet (10 mEq total) by mouth daily.   venlafaxine XR 37.5 MG 24 hr capsule Commonly known as: Effexor XR Take 1 capsule (37.5 mg total) by mouth daily with breakfast.   vitamin C 250 MG tablet Commonly known as: ASCORBIC ACID Take 250 mg by mouth daily.   zinc gluconate 50 MG tablet Take 50 mg by mouth daily.        Allergies:  Allergies  Allergen Reactions    Codeine Shortness Of Breath and Itching   Epinephrine Shortness Of Breath and Palpitations   Penicillins Anaphylaxis    Did it involve swelling of the face/tongue/throat, SOB, or low BP? Yes Did it involve sudden or severe rash/hives, skin peeling, or any reaction on the inside of your mouth or nose? No Did you need to seek medical attention at a hospital or doctor's office? Yes When did it last happen?      40+ years If all above answers are "NO", may proceed with cephalosporin use.    Prednisone Other (See Comments)    psychosis    Statins Other (See Comments)    Myalgias    Lidocaine Palpitations   Sulfa Antibiotics Rash    Past Medical History, Surgical history, Social history, and Family History were reviewed and updated.  Review of Systems: All other 10 point review of systems is negative.   Physical Exam:  vitals were not taken for this visit.   Wt Readings from Last 3 Encounters:  03/17/21 228 lb (103.4 kg)  02/07/21 229 lb 3.2 oz (104 kg)  01/24/21 229 lb 6.4 oz (104.1 kg)    Ocular: Sclerae unicteric, pupils equal, round and reactive to light Ear-nose-throat: Oropharynx clear, dentition fair Lymphatic: No cervical or supraclavicular adenopathy Lungs no rales or rhonchi, good excursion bilaterally Heart regular rate and rhythm, no murmur appreciated Abd soft, nontender, positive bowel sounds MSK no focal spinal tenderness, no joint edema Neuro: non-focal, well-oriented, appropriate affect Breasts: Deferred   Lab Results  Component Value Date   WBC 7.9 03/22/2021   HGB 9.0 (L) 03/22/2021   HCT 32.8 (L) 03/22/2021   MCV 84.1 03/22/2021   PLT 260 03/22/2021   Lab Results  Component Value Date   FERRITIN 14 03/01/2021   IRON 25 (L) 03/01/2021   TIBC 504 (H) 03/01/2021   UIBC 479 03/01/2021   IRONPCTSAT 5 (L) 03/01/2021   Lab Results  Component Value Date   RETICCTPCT 2.7 03/22/2021   RBC 3.90 03/22/2021   RBC 3.92 03/22/2021   No results found  for: KPAFRELGTCHN, LAMBDASER, KAPLAMBRATIO No results found for: IGGSERUM, IGA, IGMSERUM No results found for: Marda Stalker, SPEI   Chemistry      Component Value Date/Time   NA 142 03/22/2021 1356   NA 140 11/18/2019 1552   K 4.1 03/22/2021 1356   CL 107 03/22/2021 1356   CO2 28 03/22/2021 1356   BUN 31 (H) 03/22/2021 1356   BUN 33 (H) 11/18/2019 1552   CREATININE 0.88 03/22/2021 1356      Component Value Date/Time   CALCIUM 10.0 03/22/2021 1356   ALKPHOS 101 03/22/2021 1356  AST 25 03/22/2021 1356   ALT 18 03/22/2021 1356   BILITOT 0.8 03/22/2021 1356       Impression and Plan: Ms. Etheridge is a very pleasant 80 yo caucasian female with iron deficiency anemia secondary to intermittent GI blood loss and malabsorption. Hgb is improved at 9.0. No transfusion needed at this time.  Iron studies are pending. We will replace if needed.  Follow-up in 1 month.  They can contact our office with any questions or concerns.   Emeline Gins, NP 7/20/20222:59 PM

## 2021-03-23 ENCOUNTER — Telehealth: Payer: Self-pay | Admitting: *Deleted

## 2021-03-23 NOTE — Telephone Encounter (Signed)
Per 03/22/21 los - called and gave upcoming appointments to daughter - confirmed

## 2021-03-24 ENCOUNTER — Telehealth: Payer: Self-pay | Admitting: *Deleted

## 2021-03-24 LAB — IRON AND TIBC
Iron: 27 ug/dL — ABNORMAL LOW (ref 28–170)
Saturation Ratios: 5 % — ABNORMAL LOW (ref 10.4–31.8)
TIBC: 506 ug/dL — ABNORMAL HIGH (ref 250–450)
UIBC: 479 ug/dL

## 2021-03-24 LAB — FERRITIN: Ferritin: 20 ng/mL (ref 11–307)

## 2021-03-24 NOTE — Telephone Encounter (Signed)
Per scheduling message 03/24/21 called daughter and gave upcoming appointments for (4) doses of IV Iron - confirmed

## 2021-03-27 ENCOUNTER — Inpatient Hospital Stay: Payer: Medicare HMO

## 2021-03-27 ENCOUNTER — Other Ambulatory Visit: Payer: Self-pay

## 2021-03-27 VITALS — BP 135/69 | HR 65 | Temp 98.5°F | Resp 20

## 2021-03-27 DIAGNOSIS — D500 Iron deficiency anemia secondary to blood loss (chronic): Secondary | ICD-10-CM

## 2021-03-27 DIAGNOSIS — K31811 Angiodysplasia of stomach and duodenum with bleeding: Secondary | ICD-10-CM

## 2021-03-27 MED ORDER — SODIUM CHLORIDE 0.9 % IV SOLN
Freq: Once | INTRAVENOUS | Status: AC
  Filled 2021-03-27: qty 250

## 2021-03-27 MED ORDER — SODIUM CHLORIDE 0.9 % IV SOLN
200.0000 mg | Freq: Once | INTRAVENOUS | Status: AC
  Administered 2021-03-27: 200 mg via INTRAVENOUS
  Filled 2021-03-27: qty 200

## 2021-03-27 MED ORDER — DIPHENHYDRAMINE HCL 25 MG PO CAPS: 25.0000 mg | ORAL_CAPSULE | Freq: Once | ORAL | Status: DC

## 2021-03-27 NOTE — Patient Instructions (Signed)

## 2021-04-03 ENCOUNTER — Inpatient Hospital Stay: Payer: Medicare HMO | Attending: Hematology & Oncology

## 2021-04-03 ENCOUNTER — Ambulatory Visit: Payer: Medicare HMO | Admitting: Gastroenterology

## 2021-04-03 ENCOUNTER — Other Ambulatory Visit: Payer: Self-pay

## 2021-04-03 VITALS — BP 122/86 | HR 74 | Temp 98.0°F | Resp 18

## 2021-04-03 DIAGNOSIS — D500 Iron deficiency anemia secondary to blood loss (chronic): Secondary | ICD-10-CM

## 2021-04-03 DIAGNOSIS — K31811 Angiodysplasia of stomach and duodenum with bleeding: Secondary | ICD-10-CM

## 2021-04-03 DIAGNOSIS — K922 Gastrointestinal hemorrhage, unspecified: Secondary | ICD-10-CM | POA: Diagnosis present

## 2021-04-03 MED ORDER — SODIUM CHLORIDE 0.9 % IV SOLN
200.0000 mg | Freq: Once | INTRAVENOUS | Status: AC
  Administered 2021-04-03: 200 mg via INTRAVENOUS
  Filled 2021-04-03: qty 200

## 2021-04-03 MED ORDER — DIPHENHYDRAMINE HCL 25 MG PO CAPS
25.0000 mg | ORAL_CAPSULE | Freq: Once | ORAL | Status: AC
  Administered 2021-04-03: 25 mg via ORAL

## 2021-04-03 MED ORDER — SODIUM CHLORIDE 0.9 % IV SOLN
Freq: Once | INTRAVENOUS | Status: AC
  Filled 2021-04-03: qty 250

## 2021-04-03 NOTE — Patient Instructions (Signed)

## 2021-04-05 ENCOUNTER — Telehealth: Payer: Self-pay

## 2021-04-05 NOTE — Telephone Encounter (Signed)
Pt's daughter called. She would like to know if you would place a referral to pulmonary due to patient having SOB and low 02 stats. I advised we may be able to get 02 started here with a face to face visit and orders. Misty would like a call back if a referral is placed  or if her mom needs appointment. Please advise

## 2021-04-05 NOTE — Telephone Encounter (Signed)
Visit on 03/17/21 was to establish care with you

## 2021-04-06 ENCOUNTER — Encounter: Payer: Self-pay | Admitting: Internal Medicine

## 2021-04-06 LAB — ECHOCARDIOGRAM COMPLETE
AR max vel: 1.03 cm2
AV Area VTI: 1.21 cm2
AV Area mean vel: 1.17 cm2
AV Mean grad: 12.9 mmHg
AV Peak grad: 24.6 mmHg
Ao pk vel: 2.48 m/s
Area-P 1/2: 1.86 cm2
MV VTI: 1.25 cm2
S' Lateral: 5.3 cm

## 2021-04-06 NOTE — Telephone Encounter (Signed)
Appt made

## 2021-04-06 NOTE — Telephone Encounter (Signed)
Error

## 2021-04-10 ENCOUNTER — Encounter: Payer: Self-pay | Admitting: Medical

## 2021-04-10 ENCOUNTER — Ambulatory Visit (HOSPITAL_BASED_OUTPATIENT_CLINIC_OR_DEPARTMENT_OTHER)
Admission: RE | Admit: 2021-04-10 | Discharge: 2021-04-10 | Disposition: A | Discharge status: Home / Self Care | Payer: Medicare HMO | Source: Ambulatory Visit | Attending: Medical | Admitting: Medical

## 2021-04-10 ENCOUNTER — Other Ambulatory Visit: Payer: Self-pay

## 2021-04-10 ENCOUNTER — Inpatient Hospital Stay: Payer: Medicare HMO

## 2021-04-10 ENCOUNTER — Ambulatory Visit (INDEPENDENT_AMBULATORY_CARE_PROVIDER_SITE_OTHER): Payer: Medicare HMO | Admitting: Medical

## 2021-04-10 VITALS — BP 121/71 | HR 68 | Temp 98.0°F | Resp 18

## 2021-04-10 VITALS — BP 134/73 | HR 57 | Resp 20 | Ht <= 58 in | Wt 228.0 lb

## 2021-04-10 DIAGNOSIS — I350 Nonrheumatic aortic (valve) stenosis: Secondary | ICD-10-CM | POA: Diagnosis not present

## 2021-04-10 DIAGNOSIS — J4520 Mild intermittent asthma, uncomplicated: Secondary | ICD-10-CM

## 2021-04-10 DIAGNOSIS — K31811 Angiodysplasia of stomach and duodenum with bleeding: Secondary | ICD-10-CM

## 2021-04-10 DIAGNOSIS — D649 Anemia, unspecified: Secondary | ICD-10-CM

## 2021-04-10 DIAGNOSIS — D500 Iron deficiency anemia secondary to blood loss (chronic): Secondary | ICD-10-CM

## 2021-04-10 DIAGNOSIS — R0600 Dyspnea, unspecified: Secondary | ICD-10-CM

## 2021-04-10 DIAGNOSIS — I4819 Other persistent atrial fibrillation: Secondary | ICD-10-CM | POA: Diagnosis not present

## 2021-04-10 DIAGNOSIS — D6869 Other thrombophilia: Secondary | ICD-10-CM

## 2021-04-10 MED ORDER — SODIUM CHLORIDE 0.9 % IV SOLN
Freq: Once | INTRAVENOUS | Status: AC
  Filled 2021-04-10: qty 250

## 2021-04-10 MED ORDER — DIPHENHYDRAMINE HCL 25 MG PO CAPS
ORAL_CAPSULE | ORAL | Status: AC
  Filled 2021-04-10: qty 1

## 2021-04-10 MED ORDER — SODIUM CHLORIDE 0.9 % IV SOLN
200.0000 mg | Freq: Once | INTRAVENOUS | Status: AC
  Administered 2021-04-10: 200 mg via INTRAVENOUS
  Filled 2021-04-10: qty 200

## 2021-04-10 MED ORDER — DIPHENHYDRAMINE HCL 25 MG PO CAPS
25.0000 mg | ORAL_CAPSULE | Freq: Once | ORAL | Status: AC
  Administered 2021-04-10: 25 mg via ORAL

## 2021-04-10 NOTE — Patient Instructions (Signed)

## 2021-04-10 NOTE — Progress Notes (Addendum)
Subjective:    Patient ID: Taylor Jenkins, female    DOB: 1940-12-28, 79 y.o.   MRN: [id number]  HPI  Pt in for follow up.  Pt just saw Dr. Myna Hidalgo. She states she just got  iron infusion.  Pt has been requiring 2 liters 02 at Dr. Myna Hidalgo office to keep her 02 saturations up.. No history of smoking But second hand smoke. Pt does report a lot of second hand smoke exposure growing up. 02 sat 93-95% seated today with no 02.   Short shuffled walk with no 02 will bring 02 sat down to 91%. She only walks briefly in office before asking to sit down. During interview and walk afraid she may fall.    Pt had covid back in April.   Pt has hx of atrial fibrillation. Her pulse is 57. She does use pillows to help prop her up.   Pt echo in past 55-60%.  No chest pain reported.    Review of Systems  Constitutional:  Negative for chills, diaphoresis, fatigue and fever.  HENT:  Negative for dental problem.   Respiratory:  Positive for shortness of breath. Negative for cough, chest tightness and wheezing.        Seated.  Cardiovascular:  Negative for chest pain and palpitations.  Gastrointestinal:  Negative for abdominal pain.  Musculoskeletal:  Negative for back pain, myalgias, neck pain and neck stiffness.  Skin:  Negative for pallor and rash.    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   CAD (coronary artery disease) 2016   non-obstructive at cath   Chronic diastolic CHF (congestive heart failure) (HCC)    Diabetes mellitus without complication (HCC)    type 2   HLD (hyperlipidemia)    Hypertension    Iron deficiency anemia due to chronic blood loss 11/22/2020   S/P TAVR (transcatheter aortic valve replacement)    Severe aortic stenosis 04/26/2017     Social History   Socioeconomic History    Marital status: Widowed    Spouse name: Not on file   Number of children: 4   Years of education: Not on file   Highest education level: Not on file  Occupational History   Occupation: Retired-Secretary  Tobacco Use   Smoking status: Never   Smokeless tobacco: Never  Vaping Use   Vaping Use: Never used  Substance and Sexual Activity   Alcohol use: No    Alcohol/week: 0.0 standard drinks   Drug use: No   Sexual activity: Not on file  Other Topics Concern   Not on file  Social History Narrative   Pt lives alone in [place].    Social Determinants of Health   Financial Resource Strain: Not on file  Food Insecurity: Not on file  Transportation Needs: Not on file  Physical Activity: Not on file  Stress: Not on file  Social Connections: Not on file  Intimate Partner Violence: Not on file    Past Surgical History:  Procedure Laterality Date   LEFT HEART CATHETERIZATION WITH CORONARY ANGIOGRAM N/A 09/27/2014   Non-obstructive disease, CRF reduction recommended. Procedure: LEFT HEART CATHETERIZATION WITH CORONARY ANGIOGRAM;  Surgeon: Micheline Chapman, MD;  Location: [facility] CATH LAB;  Service: Cardiovascular;  Laterality: N/A;   RIGHT/LEFT HEART CATH AND CORONARY ANGIOGRAPHY N/A 10/30/2018   Procedure: RIGHT/LEFT HEART  CATH AND CORONARY ANGIOGRAPHY;  Surgeon: Kathleene Hazel, MD;  Location: [facility] INVASIVE CV LAB;  Service: Cardiovascular;  Laterality: N/A;   TEE WITHOUT CARDIOVERSION N/A 11/18/2018   Procedure: TRANSESOPHAGEAL ECHOCARDIOGRAM (TEE);  Surgeon: Kathleene Hazel, MD;  Location: [facility] INVASIVE CV LAB;  Service: Open Heart Surgery;  Laterality: N/A;   TRANSCATHETER AORTIC VALVE REPLACEMENT, TRANSFEMORAL N/A 11/18/2018   Procedure: TRANSCATHETER AORTIC VALVE REPLACEMENT, TRANSFEMORAL;  Surgeon: Kathleene Hazel, MD;  Location: [facility] INVASIVE CV LAB;  Service: Open Heart Surgery;  Laterality: N/A;   TUBAL LIGATION      Family History  Problem Relation Age of Onset    Transient ischemic attack Mother    Heart attack Brother 11    Allergies  Allergen Reactions   Codeine Shortness Of Breath and Itching   Epinephrine Shortness Of Breath and Palpitations   Penicillins Anaphylaxis    Did it involve swelling of the face/tongue/throat, SOB, or low BP? Yes Did it involve sudden or severe rash/hives, skin peeling, or any reaction on the inside of your mouth or nose? No Did you need to seek medical attention at a hospital or doctor's office? Yes When did it last happen?      40+ years If all above answers are "NO", may proceed with cephalosporin use.    Prednisone Other (See Comments)    psychosis    Statins Other (See Comments)    Myalgias    Lidocaine Palpitations   Sulfa Antibiotics Rash    Current Outpatient Medications on File Prior to Visit  Medication Sig Dispense Refill   allopurinol (ZYLOPRIM) 100 MG tablet Take 1 tablet (100 mg total) by mouth daily. 90 tablet 1   calcium carbonate (TUMS EX) 750 MG chewable tablet Chew 1 tablet by mouth as needed for heartburn.      ELIQUIS 5 MG TABS tablet TAKE 1 TABLET BY MOUTH TWICE A DAY 180 tablet 1   ferrous sulfate 325 (65 FE) MG tablet Take 325 mg by mouth daily.     furosemide (LASIX) 40 MG tablet Take 1 tablet by mouth daily.     insulin NPH Human (NOVOLIN N) 100 UNIT/ML injection Inject 50 Units into the skin in the morning and at bedtime.      levalbuterol (XOPENEX HFA) 45 MCG/ACT inhaler INHALE 1 TO 2 PUFFS BY MOUTH EVERY 4 HOURS AS NEEDED FOR WHEEZE     losartan (COZAAR) 25 MG tablet Take 1 tablet (25 mg total) by mouth daily.     metoprolol tartrate (LOPRESSOR) 25 MG tablet Take 1 tablet (25 mg total) by mouth 2 (two) times daily.     Multiple Vitamin (MULTIVITAMIN ADULT PO) Take 1 tablet by mouth daily.     nitroGLYCERIN (NITROSTAT) 0.4 MG SL tablet Place 1 tablet (0.4 mg total) under the tongue every 5 (five) minutes x 3 doses as needed for chest pain (or severe SOB). 25 tablet 2   nystatin  (MYCOSTATIN/NYSTOP) powder Apply 1 application topically once as needed (for skin iritation).     venlafaxine XR (EFFEXOR XR) 37.5 MG 24 hr capsule Take 1 capsule (37.5 mg total) by mouth daily with breakfast. 30 capsule 0   vitamin C (ASCORBIC ACID) 250 MG tablet Take 250 mg by mouth daily.     zinc gluconate 50 MG tablet Take 50 mg by mouth daily.     potassium chloride (KLOR-CON) 10 MEQ tablet Take 1 tablet (10 mEq total) by mouth daily. 90 tablet 3   No current  facility-administered medications on file prior to visit.    BP 134/73   Pulse (!) 57   Resp 20   Ht 4\' 9"  (1.448 m)   Wt 228 lb (103.4 kg)   SpO2 97% Comment: short walk back in exam room dropped to 91%.  BMI 49.34 kg/m       Objective:   Physical Exam  General- No acute distress. Pleasant patient. Neck- Full range of motion, no jvd Lungs- Clear, even and unlabored. Heart- regular rate and rhythm. Neurologic- CNII- XII grossly intact.   Lower ext- symmetric legs. 1+ pedal edema.  Negative homans signs.     Assessment & Plan:   Dyspnea at rest and exertion for 2 weeks.  Oxygen saturations in office on short ambulation without oxygen went down to 91%.  With oxygen  2 lO2 sats 97%.  Without oxygen at rest 93 to 95% at rest.  History of atrial fibrillation, heavy secondhand smoke exposure and anemia.  Remote history of MI in the past.  We will get stat CBC, iron level, CMP, BNP and troponin level.(Note today no active chest pain.)  D-dimer also including stat labs.  EKG done today in the office.    Bilateral lower extremity ultrasound orders placed.  Asking those to be done stat.  Advised if any severely abnormal lab result or imaging result they would need to be seen in the emergency department.  After lab and imaging review consider referral to pulmonologist.  Follow-up to be determined after imaging and lab review   Time spent with patient today was 45  minutes which consisted of chart revdiew, discussing  diagnosis, work up treatment and documentation.   , PA-C

## 2021-04-10 NOTE — Patient Instructions (Addendum)
Dyspnea at rest and exertion for 2 weeks.  Oxygen saturations in office on short ambulation without oxygen went down to 91%.  With oxygen  2  L O2 sats 97%.  Without oxygen at rest 93 to 95% at rest.  History of atrial fibrillation, heavy secondhand smoke exposure and anemia.  Remote history of MI in the past.  Ekg today showed rate controlled Atrial fibrillation. Machine read anterior -lateral infarct age undetermined. I don't agree with this as today ekg has severe artifact present on those leads. Looks similar to prior except for artifact.  We will get stat CBC, iron level, CMP, BNP and troponin level.(Note today no active chest pain.)  D-dimer also including stat labs.  EKG done today in the office.    Bilateral lower extremity ultrasound orders placed.  Asking those to be done stat.  Advised if any severely abnormal lab result or imaging result they would need to be seen in the emergency department.  After lab and imaging review consider referral to pulmonologist.  Follow-up to be determined after imaging and lab review   Will try to order ct chest angio to evaluate for PE. Lab lost dimer. Pt advised ED last night but refused. Transportation issue as well presently. So will place order and ask staff to get prior authorization. Hopefuly can do study tomorrow.

## 2021-04-11 ENCOUNTER — Other Ambulatory Visit: Payer: Self-pay | Admitting: Medical

## 2021-04-11 ENCOUNTER — Telehealth: Payer: Self-pay | Admitting: Medical

## 2021-04-11 DIAGNOSIS — R0600 Dyspnea, unspecified: Secondary | ICD-10-CM

## 2021-04-11 LAB — TROPONIN I: Troponin I: 16 ng/L (ref <=47)

## 2021-04-11 LAB — BRAIN NATRIURETIC PEPTIDE: Brain Natriuretic Peptide: 511 pg/mL — ABNORMAL HIGH (ref <100)

## 2021-04-11 MED ORDER — DOXYCYCLINE HYCLATE 100 MG PO TABS: 100.0000 mg | ORAL_TABLET | Freq: Two times a day (BID) | ORAL | 0 refills | Status: DC

## 2021-04-11 MED ORDER — LEVOFLOXACIN 500 MG PO TABS: 500.0000 mg | ORAL_TABLET | Freq: Every day | ORAL | 0 refills | Status: AC

## 2021-04-11 NOTE — Addendum Note (Signed)
Addended by: Gwenevere Abbot on: 04/11/2021 12:54 PM   Modules accepted: Orders

## 2021-04-11 NOTE — Telephone Encounter (Signed)
Opened to review and order.  D dimer not done and daughter won't bring her in just for that.

## 2021-04-11 NOTE — Telephone Encounter (Signed)
Can you see ct angio order. Get prior auth and get pt scheduled for tomorrow.

## 2021-04-11 NOTE — Telephone Encounter (Signed)
Spoke with pt's daughter and notified her that [facility] lost / misplaced d-dimer from yesterday and PCP would like it redrawn.  Taylor Jenkins states that she will not be able to bring pt back for d-dimer test. Siblings not available to help and she is working today. States she could bring pt in for outpatient CT on Wed, thurs or Friday. She is agreeable to proceed with antibiotic and states that doxycycline doesn't always clear patient's infection and she usually has to be changed to Levaquin. She is agreeable to proceed with O2 set up and states pt does not have prior acquaintance with any home health agency. Would also like social work and hospice referrals and states she spoke with PCP about this earlier. Would like Rxs sent to [facility] [place]. Taylor Jenkins states we may leave a detailed voicemail on her phone if she is not able to answer. States she is working downstairs in the ER today and we can call her there if needed.

## 2021-04-11 NOTE — Telephone Encounter (Signed)
Spoke with daughter , she stated thank you for the hospice referral and does want pulmonology referral as well for her mother.

## 2021-04-11 NOTE — Telephone Encounter (Signed)
[name]. I reviewed my note this afternoon. Made some slight changes as visit was complicated. Will you reprint and send new note for prior authoriation.

## 2021-04-11 NOTE — Addendum Note (Signed)
Addended by: Gwenevere Abbot on: 04/11/2021 05:56 PM   Modules accepted: Orders

## 2021-04-11 NOTE — Addendum Note (Signed)
Addended by: Gwenevere Abbot on: 04/11/2021 12:41 PM   Modules accepted: Orders

## 2021-04-11 NOTE — Addendum Note (Signed)
Addended by: Gwenevere Abbot on: 04/11/2021 09:03 AM   Modules accepted: Orders

## 2021-04-12 NOTE — Telephone Encounter (Signed)
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 Please tell him to call me directly." I have informed her that I will relay the message.    Please call Sueanne Margarita at [phone].

## 2021-04-13 LAB — CBC WITH DIFFERENTIAL/PLATELET
Absolute Monocytes: 738 cells/uL (ref 200–950)
Basophils Absolute: 27 cells/uL (ref 0–200)
Basophils Relative: 0.3 %
Eosinophils Absolute: 198 cells/uL (ref 15–500)
Eosinophils Relative: 2.2 %
HCT: 39.4 % (ref 35.0–45.0)
Hemoglobin: 11.3 g/dL — ABNORMAL LOW (ref 11.7–15.5)
Lymphs Abs: 1242 cells/uL (ref 850–3900)
MCH: 23.7 pg — ABNORMAL LOW (ref 27.0–33.0)
MCHC: 28.7 g/dL — ABNORMAL LOW (ref 32.0–36.0)
MCV: 82.6 fL (ref 80.0–100.0)
Monocytes Relative: 8.2 %
Neutro Abs: 6795 cells/uL (ref 1500–7800)
Neutrophils Relative %: 75.5 %
Platelets: 184 10*3/uL (ref 140–400)
RBC: 4.77 10*6/uL (ref 3.80–5.10)
RDW: 22.6 % — ABNORMAL HIGH (ref 11.0–15.0)
Total Lymphocyte: 13.8 %
WBC: 9 10*3/uL (ref 3.8–10.8)

## 2021-04-13 LAB — COMPREHENSIVE METABOLIC PANEL
AG Ratio: 1.3 (calc) (ref 1.0–2.5)
ALT: 21 U/L (ref 6–29)
AST: 32 U/L (ref 10–35)
Albumin: 3.9 g/dL (ref 3.6–5.1)
Alkaline phosphatase (APISO): 102 U/L (ref 37–153)
BUN: 16 mg/dL (ref 7–25)
CO2: 28 mmol/L (ref 20–32)
Calcium: 9.2 mg/dL (ref 8.6–10.4)
Chloride: 107 mmol/L (ref 98–110)
Creat: 0.81 mg/dL (ref 0.60–1.00)
Globulin: 3 g/dL (calc) (ref 1.9–3.7)
Glucose, Bld: 56 mg/dL — ABNORMAL LOW (ref 65–99)
Potassium: 4.1 mmol/L (ref 3.5–5.3)
Sodium: 143 mmol/L (ref 135–146)
Total Bilirubin: 0.8 mg/dL (ref 0.2–1.2)
Total Protein: 6.9 g/dL (ref 6.1–8.1)

## 2021-04-13 LAB — IRON: Iron: 495 ug/dL — ABNORMAL HIGH (ref 45–160)

## 2021-04-13 LAB — D-DIMER, QUANTITATIVE

## 2021-04-14 ENCOUNTER — Ambulatory Visit (HOSPITAL_BASED_OUTPATIENT_CLINIC_OR_DEPARTMENT_OTHER): Admission: RE | Admit: 2021-04-14 | Payer: Medicare HMO | Source: Ambulatory Visit

## 2021-04-14 ENCOUNTER — Encounter (HOSPITAL_COMMUNITY): Payer: Self-pay | Admitting: *Deleted

## 2021-04-14 ENCOUNTER — Telehealth: Payer: Self-pay | Admitting: Medical

## 2021-04-14 NOTE — Telephone Encounter (Signed)
I checked with ct tech. Not sure why pt not scheduled today for CT angio.  Tech states pt was on schedule then was taken off. Not sure why?   She is now scheduled for 04-26-2021?  Daughter made aware of changed appointment. Will you check on Monday contact pt daughter Arline Asp and get her scheduled Monday or Tuesday this coming week.

## 2021-04-14 NOTE — Telephone Encounter (Signed)
Opened to review 

## 2021-04-17 ENCOUNTER — Other Ambulatory Visit: Payer: Self-pay

## 2021-04-17 ENCOUNTER — Other Ambulatory Visit (HOSPITAL_BASED_OUTPATIENT_CLINIC_OR_DEPARTMENT_OTHER): Payer: Medicare HMO

## 2021-04-17 ENCOUNTER — Inpatient Hospital Stay: Payer: Medicare HMO

## 2021-04-17 VITALS — BP 148/55 | HR 62 | Temp 98.1°F | Resp 18

## 2021-04-17 DIAGNOSIS — D500 Iron deficiency anemia secondary to blood loss (chronic): Secondary | ICD-10-CM

## 2021-04-17 DIAGNOSIS — K31811 Angiodysplasia of stomach and duodenum with bleeding: Secondary | ICD-10-CM

## 2021-04-17 MED ORDER — SODIUM CHLORIDE 0.9 % IV SOLN: Freq: Once | INTRAVENOUS | Status: AC

## 2021-04-17 MED ORDER — SODIUM CHLORIDE 0.9 % IV SOLN
200.0000 mg | Freq: Once | INTRAVENOUS | Status: AC
  Administered 2021-04-17: 200 mg via INTRAVENOUS
  Filled 2021-04-17: qty 200

## 2021-04-17 NOTE — Telephone Encounter (Signed)
CT angio canceled due to patient having an oncology appointment today at 12 pm , spoke with [facility] stated she is unable to bring mom in sooner due to appointments on both ends , and also stated she is doing much better.

## 2021-04-17 NOTE — Patient Instructions (Signed)

## 2021-04-19 ENCOUNTER — Inpatient Hospital Stay: Payer: Medicare HMO | Admitting: Family

## 2021-04-19 ENCOUNTER — Inpatient Hospital Stay: Payer: Medicare HMO

## 2021-04-21 ENCOUNTER — Other Ambulatory Visit: Payer: Self-pay | Admitting: Internal Medicine

## 2021-04-26 ENCOUNTER — Ambulatory Visit (HOSPITAL_BASED_OUTPATIENT_CLINIC_OR_DEPARTMENT_OTHER): Payer: Medicare HMO

## 2021-05-30 ENCOUNTER — Inpatient Hospital Stay: Payer: Medicare HMO | Attending: Hematology & Oncology

## 2021-05-30 ENCOUNTER — Inpatient Hospital Stay: Payer: Medicare HMO | Admitting: Family

## 2021-05-30 ENCOUNTER — Other Ambulatory Visit: Payer: Self-pay

## 2021-05-30 ENCOUNTER — Encounter: Payer: Self-pay | Admitting: Family

## 2021-05-30 VITALS — BP 159/78 | HR 80 | Temp 98.6°F | Resp 20 | Ht <= 58 in | Wt 223.1 lb

## 2021-05-30 DIAGNOSIS — K909 Intestinal malabsorption, unspecified: Secondary | ICD-10-CM | POA: Diagnosis present

## 2021-05-30 DIAGNOSIS — G629 Polyneuropathy, unspecified: Secondary | ICD-10-CM | POA: Insufficient documentation

## 2021-05-30 DIAGNOSIS — R5383 Other fatigue: Secondary | ICD-10-CM | POA: Insufficient documentation

## 2021-05-30 DIAGNOSIS — Z7901 Long term (current) use of anticoagulants: Secondary | ICD-10-CM | POA: Diagnosis not present

## 2021-05-30 DIAGNOSIS — D500 Iron deficiency anemia secondary to blood loss (chronic): Secondary | ICD-10-CM | POA: Insufficient documentation

## 2021-05-30 DIAGNOSIS — D649 Anemia, unspecified: Secondary | ICD-10-CM

## 2021-05-30 LAB — CBC WITH DIFFERENTIAL (CANCER CENTER ONLY)
Abs Immature Granulocytes: 0.15 10*3/uL — ABNORMAL HIGH (ref 0.00–0.07)
Basophils Absolute: 0.1 10*3/uL (ref 0.0–0.1)
Basophils Relative: 1 %
Eosinophils Absolute: 0.5 10*3/uL (ref 0.0–0.5)
Eosinophils Relative: 4 %
HCT: 49.5 % — ABNORMAL HIGH (ref 36.0–46.0)
Hemoglobin: 15 g/dL (ref 12.0–15.0)
Immature Granulocytes: 1 %
Lymphocytes Relative: 15 %
Lymphs Abs: 1.6 10*3/uL (ref 0.7–4.0)
MCH: 26.3 pg (ref 26.0–34.0)
MCHC: 30.3 g/dL (ref 30.0–36.0)
MCV: 86.8 fL (ref 80.0–100.0)
Monocytes Absolute: 0.9 10*3/uL (ref 0.1–1.0)
Monocytes Relative: 9 %
Neutro Abs: 7.3 10*3/uL (ref 1.7–7.7)
Neutrophils Relative %: 70 %
Platelet Count: 192 10*3/uL (ref 150–400)
RBC: 5.7 MIL/uL — ABNORMAL HIGH (ref 3.87–5.11)
RDW: 19.5 % — ABNORMAL HIGH (ref 11.5–15.5)
WBC Count: 10.5 10*3/uL (ref 4.0–10.5)
nRBC: 0 % (ref 0.0–0.2)

## 2021-05-30 LAB — CMP (CANCER CENTER ONLY)
ALT: 36 U/L (ref 0–44)
AST: 38 U/L (ref 15–41)
Albumin: 4.1 g/dL (ref 3.5–5.0)
Alkaline Phosphatase: 122 U/L (ref 38–126)
Anion gap: 9 (ref 5–15)
BUN: 25 mg/dL — ABNORMAL HIGH (ref 8–23)
CO2: 30 mmol/L (ref 22–32)
Calcium: 9.9 mg/dL (ref 8.9–10.3)
Chloride: 102 mmol/L (ref 98–111)
Creatinine: 0.97 mg/dL (ref 0.44–1.00)
GFR, Estimated: 60 mL/min — ABNORMAL LOW (ref >60)
Glucose, Bld: 127 mg/dL — ABNORMAL HIGH (ref 70–99)
Potassium: 4.2 mmol/L (ref 3.5–5.1)
Sodium: 141 mmol/L (ref 135–145)
Total Bilirubin: 0.5 mg/dL (ref 0.3–1.2)
Total Protein: 7.6 g/dL (ref 6.5–8.1)

## 2021-05-30 LAB — SAMPLE TO BLOOD BANK

## 2021-05-30 LAB — RETICULOCYTES
Immature Retic Fract: 12.5 % (ref 2.3–15.9)
RBC.: 5.65 MIL/uL — ABNORMAL HIGH (ref 3.87–5.11)
Retic Count, Absolute: 70.6 10*3/uL (ref 19.0–186.0)
Retic Ct Pct: 1.3 % (ref 0.4–3.1)

## 2021-05-30 NOTE — Progress Notes (Signed)
Hematology and Oncology Follow Up Visit  Rona Tomson [id number] Dec 26, 1940 80 y.o. 05/30/2021   Principle Diagnosis:  Iron deficiency anemia --patient on Eliquis/malabsorption   Current Therapy:        IV iron as indicated Transfusional support as needed   Interim History:  Ms. Pettitt is here today with her daughter for follow-up. She is quite well and has no complaints at this time.  She is ambulatory with a cane and no longer requiring a wheelchair.  She was treated recently for pneumonia and is feeling so much better.  Hgb is now up to 15.0, MCV 86, platelets 192 and WBC count 10.5.  No blood loss noted. She is holding off on the GI referral for now since she is asymptomatic.  No abnormal bruising, no petechiae.  She has mild fatigue at times and takes a break to rest when needed.  No fever, chills, n/v, cough, rash, dizziness, chest pain, palpitations, abdominal pain or changes in bowel or bladder habits.  No falls or syncope to report.  She has mild puffiness in her lower extremities that improved with diuretic.  Neuropathy in her feet is unchanged from baseline.  Pedal pulses are 2+.  She has a good appetite and is staying well hydrated. Her weight is stable at 223 lbs.   ECOG Performance Status: 0 - Asymptomatic  Medications:  Allergies as of 05/30/2021       Reactions   Codeine Shortness Of Breath, Itching   Epinephrine Shortness Of Breath, Palpitations   Penicillins Anaphylaxis   Did it involve swelling of the face/tongue/throat, SOB, or low BP? Yes Did it involve sudden or severe rash/hives, skin peeling, or any reaction on the inside of your mouth or nose? No Did you need to seek medical attention at a hospital or doctor's office? Yes When did it last happen?      40+ years If all above answers are "NO", may proceed with cephalosporin use.   Prednisone Other (See Comments)   psychosis    Statins Other (See Comments)   Myalgias   Lidocaine Palpitations   Sulfa  Antibiotics Rash        Medication List        Accurate as of May 30, 2021 11:02 AM. If you have any questions, ask your nurse or doctor.          allopurinol 100 MG tablet Commonly known as: ZYLOPRIM Take 1 tablet (100 mg total) by mouth daily.   calcium carbonate 750 MG chewable tablet Commonly known as: TUMS EX Chew 1 tablet by mouth as needed for heartburn.   Eliquis 5 MG Tabs tablet Generic drug: apixaban TAKE 1 TABLET BY MOUTH TWICE A DAY   ferrous sulfate 325 (65 FE) MG tablet Take 325 mg by mouth daily.   furosemide 40 MG tablet Commonly known as: LASIX Take 1 tablet by mouth daily.   insulin NPH Human 100 UNIT/ML injection Commonly known as: NOVOLIN N Inject 50 Units into the skin in the morning and at bedtime.   levalbuterol 45 MCG/ACT inhaler Commonly known as: XOPENEX HFA INHALE 1 TO 2 PUFFS BY MOUTH EVERY 4 HOURS AS NEEDED FOR WHEEZE   losartan 25 MG tablet Commonly known as: COZAAR Take 1 tablet (25 mg total) by mouth daily.   metoprolol tartrate 25 MG tablet Commonly known as: LOPRESSOR Take 1 tablet (25 mg total) by mouth 2 (two) times daily.   MULTIVITAMIN ADULT PO Take 1 tablet by mouth daily.  nitroGLYCERIN 0.4 MG SL tablet Commonly known as: NITROSTAT Place 1 tablet (0.4 mg total) under the tongue every 5 (five) minutes x 3 doses as needed for chest pain (or severe SOB).   nystatin powder Commonly known as: MYCOSTATIN/NYSTOP Apply 1 application topically once as needed (for skin iritation).   potassium chloride 10 MEQ tablet Commonly known as: KLOR-CON Take 1 tablet (10 mEq total) by mouth daily.   venlafaxine XR 37.5 MG 24 hr capsule Commonly known as: EFFEXOR-XR TAKE 1 CAPSULE BY MOUTH DAILY WITH BREAKFAST.   vitamin C 250 MG tablet Commonly known as: ASCORBIC ACID Take 250 mg by mouth daily.   zinc gluconate 50 MG tablet Take 50 mg by mouth daily.        Allergies:  Allergies  Allergen Reactions   Codeine  Shortness Of Breath and Itching   Epinephrine Shortness Of Breath and Palpitations   Penicillins Anaphylaxis    Did it involve swelling of the face/tongue/throat, SOB, or low BP? Yes Did it involve sudden or severe rash/hives, skin peeling, or any reaction on the inside of your mouth or nose? No Did you need to seek medical attention at a hospital or doctor's office? Yes When did it last happen?      40+ years If all above answers are "NO", may proceed with cephalosporin use.    Prednisone Other (See Comments)    psychosis    Statins Other (See Comments)    Myalgias    Lidocaine Palpitations   Sulfa Antibiotics Rash    Past Medical History, Surgical history, Social history, and Family History were reviewed and updated.  Review of Systems: All other 10 point review of systems is negative.   Physical Exam:  vitals were not taken for this visit.   Wt Readings from Last 3 Encounters:  04/10/21 228 lb (103.4 kg)  03/22/21 228 lb (103.4 kg)  03/17/21 228 lb (103.4 kg)    Ocular: Sclerae unicteric, pupils equal, round and reactive to light Ear-nose-throat: Oropharynx clear, dentition fair Lymphatic: No cervical or supraclavicular adenopathy Lungs no rales or rhonchi, good excursion bilaterally Heart regular rate and rhythm, no murmur appreciated Abd soft, nontender, positive bowel sounds MSK no focal spinal tenderness, no joint edema Neuro: non-focal, well-oriented, appropriate affect Breasts: Deferred   Lab Results  Component Value Date   WBC 9.0 04/10/2021   HGB 11.3 (L) 04/10/2021   HCT 39.4 04/10/2021   MCV 82.6 04/10/2021   PLT 184 04/10/2021   Lab Results  Component Value Date   FERRITIN 20 03/22/2021   IRON 495 (H) 04/10/2021   TIBC 506 (H) 03/22/2021   UIBC 479 03/22/2021   IRONPCTSAT 5 (L) 03/22/2021   Lab Results  Component Value Date   RETICCTPCT 1.3 05/30/2021   RBC 5.65 (H) 05/30/2021   No results found for: KPAFRELGTCHN, LAMBDASER,  KAPLAMBRATIO No results found for: IGGSERUM, IGA, IGMSERUM No results found for: Marda Stalker, SPEI   Chemistry      Component Value Date/Time   NA 143 04/10/2021 1557   NA 140 11/18/2019 1552   K 4.1 04/10/2021 1557   CL 107 04/10/2021 1557   CO2 28 04/10/2021 1557   BUN 16 04/10/2021 1557   BUN 33 (H) 11/18/2019 1552   CREATININE 0.81 04/10/2021 1557      Component Value Date/Time   CALCIUM 9.2 04/10/2021 1557   ALKPHOS 101 03/22/2021 1356   AST 32 04/10/2021 1557   AST 25  03/22/2021 1356   ALT 21 04/10/2021 1557   ALT 18 03/22/2021 1356   BILITOT 0.8 04/10/2021 1557   BILITOT 0.8 03/22/2021 1356       Impression and Plan: Ms. Belflower is a very pleasant 80 yo caucasian female with iron deficiency anemia secondary to intermittent GI blood loss and malabsorption. Iron studies pending. Hgb is 15.0! No transfusion needed.  Follow-up in 3 months.  They can contact our office with any questions or concerns.   Eileen Stanford, NP 9/27/202211:02 AM

## 2021-05-31 LAB — IRON AND TIBC
Iron: 74 ug/dL (ref 41–142)
Saturation Ratios: 20 % — ABNORMAL LOW (ref 21–57)
TIBC: 380 ug/dL (ref 236–444)
UIBC: 306 ug/dL (ref 120–384)

## 2021-05-31 LAB — FERRITIN: Ferritin: 128 ng/mL (ref 11–307)

## 2021-06-06 ENCOUNTER — Inpatient Hospital Stay: Payer: Medicare HMO | Attending: Hematology & Oncology

## 2021-06-13 ENCOUNTER — Inpatient Hospital Stay: Payer: Medicare HMO

## 2021-06-17 ENCOUNTER — Other Ambulatory Visit: Payer: Self-pay | Admitting: Internal Medicine

## 2021-06-17 DIAGNOSIS — I4819 Other persistent atrial fibrillation: Secondary | ICD-10-CM

## 2021-06-19 ENCOUNTER — Other Ambulatory Visit: Payer: Self-pay | Admitting: Internal Medicine

## 2021-06-19 NOTE — Telephone Encounter (Signed)
Prescription refill request for Eliquis received. Indication:afib Last office visit:carroll 02/07/21 Scr: 0.97 05/30/21 Age: 20f Weight:101.2kg

## 2021-07-03 ENCOUNTER — Ambulatory Visit: Payer: Medicare HMO | Admitting: Internal Medicine

## 2021-07-07 ENCOUNTER — Telehealth: Payer: Self-pay | Admitting: Medical

## 2021-07-07 NOTE — Telephone Encounter (Signed)
Left message for patient's daughter, Arline Asp,  to call back and schedule Medicare Annual Wellness Visit (AWV) in office.   If not able to come in office, please offer to do virtually or by telephone.  Left office number and my jabber [phone].  Due for AWVI  Please schedule at anytime with Nurse Health Advisor.

## 2021-07-12 ENCOUNTER — Other Ambulatory Visit: Payer: Self-pay | Admitting: Family

## 2021-07-20 ENCOUNTER — Other Ambulatory Visit: Payer: Self-pay

## 2021-07-20 ENCOUNTER — Inpatient Hospital Stay: Payer: Medicare HMO | Attending: Hematology & Oncology

## 2021-07-20 VITALS — BP 158/66 | HR 75 | Temp 98.2°F | Resp 19

## 2021-07-20 DIAGNOSIS — Z794 Long term (current) use of insulin: Secondary | ICD-10-CM | POA: Insufficient documentation

## 2021-07-20 DIAGNOSIS — K31811 Angiodysplasia of stomach and duodenum with bleeding: Secondary | ICD-10-CM

## 2021-07-20 DIAGNOSIS — D508 Other iron deficiency anemias: Secondary | ICD-10-CM | POA: Diagnosis present

## 2021-07-20 DIAGNOSIS — Z7901 Long term (current) use of anticoagulants: Secondary | ICD-10-CM | POA: Insufficient documentation

## 2021-07-20 DIAGNOSIS — D500 Iron deficiency anemia secondary to blood loss (chronic): Secondary | ICD-10-CM

## 2021-07-20 DIAGNOSIS — K909 Intestinal malabsorption, unspecified: Secondary | ICD-10-CM | POA: Insufficient documentation

## 2021-07-20 MED ORDER — SODIUM CHLORIDE 0.9 % IV SOLN
200.0000 mg | Freq: Once | INTRAVENOUS | Status: AC
  Administered 2021-07-20: 09:00:00 200 mg via INTRAVENOUS
  Filled 2021-07-20: qty 200

## 2021-07-20 MED ORDER — SODIUM CHLORIDE 0.9 % IV SOLN: Freq: Once | INTRAVENOUS | Status: AC

## 2021-07-20 NOTE — Patient Instructions (Signed)

## 2021-07-25 ENCOUNTER — Inpatient Hospital Stay: Payer: Medicare HMO

## 2021-07-25 ENCOUNTER — Other Ambulatory Visit: Payer: Self-pay

## 2021-07-25 VITALS — BP 154/86 | HR 75 | Temp 98.0°F | Resp 17

## 2021-07-25 DIAGNOSIS — K31811 Angiodysplasia of stomach and duodenum with bleeding: Secondary | ICD-10-CM

## 2021-07-25 DIAGNOSIS — D508 Other iron deficiency anemias: Secondary | ICD-10-CM | POA: Diagnosis not present

## 2021-07-25 DIAGNOSIS — D500 Iron deficiency anemia secondary to blood loss (chronic): Secondary | ICD-10-CM

## 2021-07-25 MED ORDER — SODIUM CHLORIDE 0.9 % IV SOLN
200.0000 mg | Freq: Once | INTRAVENOUS | Status: AC
  Administered 2021-07-25: 200 mg via INTRAVENOUS
  Filled 2021-07-25: qty 200

## 2021-07-25 MED ORDER — SODIUM CHLORIDE 0.9 % IV SOLN: Freq: Once | INTRAVENOUS | Status: AC

## 2021-07-25 NOTE — Patient Instructions (Signed)

## 2021-07-26 ENCOUNTER — Ambulatory Visit: Payer: Medicare HMO

## 2021-08-30 ENCOUNTER — Encounter: Payer: Self-pay | Admitting: Family

## 2021-08-30 ENCOUNTER — Inpatient Hospital Stay: Payer: Medicare HMO

## 2021-08-30 ENCOUNTER — Inpatient Hospital Stay: Payer: Medicare HMO | Attending: Hematology & Oncology | Admitting: Family

## 2021-08-30 ENCOUNTER — Other Ambulatory Visit: Payer: Self-pay

## 2021-08-30 VITALS — BP 134/80 | HR 76 | Temp 98.1°F | Resp 20 | Ht <= 58 in | Wt 238.0 lb

## 2021-08-30 DIAGNOSIS — K909 Intestinal malabsorption, unspecified: Secondary | ICD-10-CM | POA: Insufficient documentation

## 2021-08-30 DIAGNOSIS — D649 Anemia, unspecified: Secondary | ICD-10-CM

## 2021-08-30 DIAGNOSIS — D508 Other iron deficiency anemias: Secondary | ICD-10-CM | POA: Insufficient documentation

## 2021-08-30 DIAGNOSIS — Z7901 Long term (current) use of anticoagulants: Secondary | ICD-10-CM | POA: Diagnosis not present

## 2021-08-30 DIAGNOSIS — K922 Gastrointestinal hemorrhage, unspecified: Secondary | ICD-10-CM | POA: Insufficient documentation

## 2021-08-30 DIAGNOSIS — K31811 Angiodysplasia of stomach and duodenum with bleeding: Secondary | ICD-10-CM

## 2021-08-30 DIAGNOSIS — D500 Iron deficiency anemia secondary to blood loss (chronic): Secondary | ICD-10-CM | POA: Diagnosis not present

## 2021-08-30 LAB — FERRITIN: Ferritin: 151 ng/mL (ref 11–307)

## 2021-08-30 LAB — CMP (CANCER CENTER ONLY)
ALT: 27 U/L (ref 0–44)
AST: 31 U/L (ref 15–41)
Albumin: 4 g/dL (ref 3.5–5.0)
Alkaline Phosphatase: 135 U/L — ABNORMAL HIGH (ref 38–126)
Anion gap: 7 (ref 5–15)
BUN: 20 mg/dL (ref 8–23)
CO2: 33 mmol/L — ABNORMAL HIGH (ref 22–32)
Calcium: 9.8 mg/dL (ref 8.9–10.3)
Chloride: 101 mmol/L (ref 98–111)
Creatinine: 0.96 mg/dL (ref 0.44–1.00)
GFR, Estimated: >60 mL/min (ref >60)
Glucose, Bld: 150 mg/dL — ABNORMAL HIGH (ref 70–99)
Potassium: 3.8 mmol/L (ref 3.5–5.1)
Sodium: 141 mmol/L (ref 135–145)
Total Bilirubin: 0.7 mg/dL (ref 0.3–1.2)
Total Protein: 7.2 g/dL (ref 6.5–8.1)

## 2021-08-30 LAB — CBC WITH DIFFERENTIAL (CANCER CENTER ONLY)
Abs Immature Granulocytes: 0.05 10*3/uL (ref 0.00–0.07)
Basophils Absolute: 0.1 10*3/uL (ref 0.0–0.1)
Basophils Relative: 1 %
Eosinophils Absolute: 0.2 10*3/uL (ref 0.0–0.5)
Eosinophils Relative: 2 %
HCT: 46.4 % — ABNORMAL HIGH (ref 36.0–46.0)
Hemoglobin: 14.3 g/dL (ref 12.0–15.0)
Immature Granulocytes: 1 %
Lymphocytes Relative: 10 %
Lymphs Abs: 1.1 10*3/uL (ref 0.7–4.0)
MCH: 29.3 pg (ref 26.0–34.0)
MCHC: 30.8 g/dL (ref 30.0–36.0)
MCV: 95.1 fL (ref 80.0–100.0)
Monocytes Absolute: 0.7 10*3/uL (ref 0.1–1.0)
Monocytes Relative: 7 %
Neutro Abs: 8 10*3/uL — ABNORMAL HIGH (ref 1.7–7.7)
Neutrophils Relative %: 79 %
Platelet Count: 181 10*3/uL (ref 150–400)
RBC: 4.88 MIL/uL (ref 3.87–5.11)
RDW: 16.6 % — ABNORMAL HIGH (ref 11.5–15.5)
WBC Count: 10.1 10*3/uL (ref 4.0–10.5)
nRBC: 0 % (ref 0.0–0.2)

## 2021-08-30 LAB — RETICULOCYTES
Immature Retic Fract: 19.6 % — ABNORMAL HIGH (ref 2.3–15.9)
RBC.: 4.89 MIL/uL (ref 3.87–5.11)
Retic Count, Absolute: 136.4 10*3/uL (ref 19.0–186.0)
Retic Ct Pct: 2.8 % (ref 0.4–3.1)

## 2021-08-30 LAB — IRON AND IRON BINDING CAPACITY (CC-WL,HP ONLY)
Iron: 53 ug/dL (ref 28–170)
Saturation Ratios: 14 % (ref 10.4–31.8)
TIBC: 389 ug/dL (ref 250–450)
UIBC: 336 ug/dL (ref 148–442)

## 2021-08-30 LAB — SAMPLE TO BLOOD BANK

## 2021-08-30 NOTE — Progress Notes (Signed)
Hematology and Oncology Follow Up Visit  Taylor Jenkins [id number] [birth year] 80 y.o. 08/30/2021   Principle Diagnosis:  Iron deficiency anemia --patient on Eliquis/malabsorption   Current Therapy:        IV iron as indicated Transfusional support as needed   Interim History:  Taylor Jenkins is here today with her daughter for follow-up. She is having issues with both lower extremities. She has mild hyper pigmentation below both knees and multiple scabbed lesions on both lower extremities bellow the knees. She states that these areas burn and itch and she has noted for the last several months.She also states that she can not feel her feet. Pedal pulses are 1+. Her symptoms are concerning for PVD.  Minimal swelling in her lower legs.  She has some bleeding from s cab on the left calf where she was scratching. No other blood loss noted.  No abnormal bruising, no petechiae.  She denies fever, chills, n/v, cough, rash, dizziness, chest pain, palpitations, abdominal pain or changes in bowel or bladder habits.  She has SOB with over exertion and takes a break to rest as needed.  No falls or syncope to report. She is ambulating with a cane for added support.  Her appetite is ok and she is doing her best to stay properly hydrated throughout the day. Her weight is 238 lbs.   ECOG Performance Status: 1 - Symptomatic but completely ambulatory  Medications:  Allergies as of 08/30/2021       Reactions   Codeine Shortness Of Breath, Itching   Epinephrine Shortness Of Breath, Palpitations   Penicillins Anaphylaxis   Did it involve swelling of the face/tongue/throat, SOB, or low BP? Yes Did it involve sudden or severe rash/hives, skin peeling, or any reaction on the inside of your mouth or nose? No Did you need to seek medical attention at a hospital or doctor's office? Yes When did it last happen?      40+ years If all above answers are "NO", may proceed with cephalosporin use.   Prednisone Other  (See Comments)   psychosis    Statins Other (See Comments)   Myalgias   Lidocaine Palpitations   Sulfa Antibiotics Rash        Medication List        Accurate as of August 30, 2021 11:38 AM. If you have any questions, ask your nurse or doctor.          allopurinol 100 MG tablet Commonly known as: ZYLOPRIM Take 1 tablet (100 mg total) by mouth daily.   calcium carbonate 750 MG chewable tablet Commonly known as: TUMS EX Chew 1 tablet by mouth as needed for heartburn.   Eliquis 5 MG Tabs tablet Generic drug: apixaban TAKE 1 TABLET BY MOUTH TWICE A DAY   ferrous sulfate 325 (65 FE) MG tablet Take 325 mg by mouth daily.   furosemide 40 MG tablet Commonly known as: LASIX Take 1 tablet by mouth daily.   furosemide 20 MG tablet Commonly known as: LASIX TAKE 1 TABLET (20MG ) BY MOUTH DAILY. OK TO TAKE EXTRA TABLET DAILY AS NEEDED   insulin NPH Human 100 UNIT/ML injection Commonly known as: NOVOLIN N Inject 50 Units into the skin in the morning and at bedtime.   levalbuterol 45 MCG/ACT inhaler Commonly known as: XOPENEX HFA INHALE 1 TO 2 PUFFS BY MOUTH EVERY 4 HOURS AS NEEDED FOR WHEEZE   losartan 25 MG tablet Commonly known as: COZAAR Take 1 tablet (25 mg total) by mouth daily.  metoprolol tartrate 25 MG tablet Commonly known as: LOPRESSOR Take 1 tablet (25 mg total) by mouth 2 (two) times daily.   MULTIVITAMIN ADULT PO Take 1 tablet by mouth daily.   nitroGLYCERIN 0.4 MG SL tablet Commonly known as: NITROSTAT Place 1 tablet (0.4 mg total) under the tongue every 5 (five) minutes x 3 doses as needed for chest pain (or severe SOB).   nystatin powder Commonly known as: MYCOSTATIN/NYSTOP Apply 1 application topically once as needed (for skin iritation).   potassium chloride 10 MEQ tablet Commonly known as: KLOR-CON M Take 1 tablet (10 mEq total) by mouth daily.   venlafaxine XR 37.5 MG 24 hr capsule Commonly known as: EFFEXOR-XR TAKE 1 CAPSULE BY MOUTH  DAILY WITH BREAKFAST.   vitamin C 250 MG tablet Commonly known as: ASCORBIC ACID Take 250 mg by mouth daily.   zinc gluconate 50 MG tablet Take 50 mg by mouth daily.        Allergies:  Allergies  Allergen Reactions   Codeine Shortness Of Breath and Itching   Epinephrine Shortness Of Breath and Palpitations   Penicillins Anaphylaxis    Did it involve swelling of the face/tongue/throat, SOB, or low BP? Yes Did it involve sudden or severe rash/hives, skin peeling, or any reaction on the inside of your mouth or nose? No Did you need to seek medical attention at a hospital or doctor's office? Yes When did it last happen?      40+ years If all above answers are "NO", may proceed with cephalosporin use.    Prednisone Other (See Comments)    psychosis    Statins Other (See Comments)    Myalgias    Lidocaine Palpitations   Sulfa Antibiotics Rash    Past Medical History, Surgical history, Social history, and Family History were reviewed and updated.  Review of Systems: All other 10 point review of systems is negative.   Physical Exam:  height is 4\' 9"  (1.448 m) and weight is 238 lb 0.6 oz (108 kg). Her oral temperature is 98.1 F (36.7 C). Her blood pressure is 134/80 and her pulse is 76. Her respiration is 20 and oxygen saturation is 93%.   Wt Readings from Last 3 Encounters:  08/30/21 238 lb 0.6 oz (108 kg)  05/30/21 223 lb 1.3 oz (101.2 kg)  04/10/21 228 lb (103.4 kg)    Ocular: Sclerae unicteric, pupils equal, round and reactive to light Ear-nose-throat: Oropharynx clear, dentition fair Lymphatic: No cervical or supraclavicular adenopathy Lungs no rales or rhonchi, good excursion bilaterally Heart regular rate and rhythm, no murmur appreciated Abd soft, nontender, positive bowel sounds MSK no focal spinal tenderness, no joint edema Neuro: non-focal, well-oriented, appropriate affect Breasts: Deferred   Lab Results  Component Value Date   WBC 10.1 08/30/2021    HGB 14.3 08/30/2021   HCT 46.4 (H) 08/30/2021   MCV 95.1 08/30/2021   PLT 181 08/30/2021   Lab Results  Component Value Date   FERRITIN 128 05/30/2021   IRON 74 05/30/2021   TIBC 380 05/30/2021   UIBC 306 05/30/2021   IRONPCTSAT 20 (L) 05/30/2021   Lab Results  Component Value Date   RETICCTPCT 2.8 08/30/2021   RBC 4.89 08/30/2021   RBC 4.88 08/30/2021   No results found for: KPAFRELGTCHN, LAMBDASER, KAPLAMBRATIO No results found for: IGGSERUM, IGA, IGMSERUM No results found for: TOTALPROTELP, ALBUMINELP, A1GS, A2GS, BETS, BETA2SER, GAMS, MSPIKE, SPEI   Chemistry      Component Value Date/Time   NA  141 08/30/2021 1012   NA 140 11/18/2019 1552   K 3.8 08/30/2021 1012   CL 101 08/30/2021 1012   CO2 33 (H) 08/30/2021 1012   BUN 20 08/30/2021 1012   BUN 33 (H) 11/18/2019 1552   CREATININE 0.96 08/30/2021 1012   CREATININE 0.81 04/10/2021 1557      Component Value Date/Time   CALCIUM 9.8 08/30/2021 1012   ALKPHOS 135 (H) 08/30/2021 1012   AST 31 08/30/2021 1012   ALT 27 08/30/2021 1012   BILITOT 0.7 08/30/2021 1012       Impression and Plan: Taylor Jenkins is a very pleasant 80 yo caucasian female with iron deficiency anemia secondary to intermittent GI blood loss and malabsorption. She is symptomatic as mentioned above. I have reached out to her PCP to let them know and patient plans to schedule a follow-up for further eval.  Iron studies are pending. Follow-up in 3 months.   Eileen Stanford, NP 12/28/202211:38 AM

## 2021-09-01 ENCOUNTER — Other Ambulatory Visit: Payer: Self-pay | Admitting: Family

## 2021-09-05 ENCOUNTER — Other Ambulatory Visit: Payer: Self-pay

## 2021-09-05 ENCOUNTER — Inpatient Hospital Stay: Payer: Medicare HMO | Attending: Hematology & Oncology

## 2021-09-05 VITALS — BP 158/89 | HR 69 | Temp 98.0°F | Resp 21

## 2021-09-05 DIAGNOSIS — K922 Gastrointestinal hemorrhage, unspecified: Secondary | ICD-10-CM | POA: Diagnosis not present

## 2021-09-05 DIAGNOSIS — D508 Other iron deficiency anemias: Secondary | ICD-10-CM | POA: Insufficient documentation

## 2021-09-05 DIAGNOSIS — K909 Intestinal malabsorption, unspecified: Secondary | ICD-10-CM | POA: Insufficient documentation

## 2021-09-05 DIAGNOSIS — Z7901 Long term (current) use of anticoagulants: Secondary | ICD-10-CM | POA: Insufficient documentation

## 2021-09-05 DIAGNOSIS — D500 Iron deficiency anemia secondary to blood loss (chronic): Secondary | ICD-10-CM

## 2021-09-05 DIAGNOSIS — K31811 Angiodysplasia of stomach and duodenum with bleeding: Secondary | ICD-10-CM

## 2021-09-05 MED ORDER — SODIUM CHLORIDE 0.9 % IV SOLN
300.0000 mg | Freq: Once | INTRAVENOUS | Status: AC
  Administered 2021-09-05: 300 mg via INTRAVENOUS
  Filled 2021-09-05: qty 300

## 2021-09-05 MED ORDER — SODIUM CHLORIDE 0.9 % IV SOLN: Freq: Once | INTRAVENOUS | Status: AC

## 2021-09-05 NOTE — Patient Instructions (Signed)

## 2021-09-06 ENCOUNTER — Telehealth: Payer: Self-pay | Admitting: Medical

## 2021-09-06 NOTE — Telephone Encounter (Signed)
Pt called and lvm to return call 

## 2021-09-06 NOTE — Telephone Encounter (Signed)
Will you call pt and get her scheduled to discuss lesion on legs/feet. So can likely refer to vascular surgeon.  Mackie Pai, PA-C

## 2021-09-07 NOTE — Telephone Encounter (Signed)
Pt called and lvm to return call 

## 2021-09-11 ENCOUNTER — Encounter: Payer: Self-pay | Admitting: Internal Medicine

## 2021-09-11 ENCOUNTER — Telehealth: Payer: Self-pay | Admitting: Medical

## 2021-09-11 ENCOUNTER — Telehealth: Payer: Self-pay | Admitting: Internal Medicine

## 2021-09-11 ENCOUNTER — Ambulatory Visit (INDEPENDENT_AMBULATORY_CARE_PROVIDER_SITE_OTHER): Payer: Medicare HMO | Admitting: Internal Medicine

## 2021-09-11 ENCOUNTER — Other Ambulatory Visit: Payer: Self-pay

## 2021-09-11 ENCOUNTER — Ambulatory Visit (HOSPITAL_BASED_OUTPATIENT_CLINIC_OR_DEPARTMENT_OTHER)
Admission: RE | Admit: 2021-09-11 | Discharge: 2021-09-11 | Disposition: A | Discharge status: Home / Self Care | Payer: Medicare HMO | Source: Ambulatory Visit | Attending: Internal Medicine | Admitting: Internal Medicine

## 2021-09-11 VITALS — BP 142/68 | HR 85 | Temp 97.8°F | Resp 20 | Ht <= 58 in | Wt 238.5 lb

## 2021-09-11 DIAGNOSIS — J9611 Chronic respiratory failure with hypoxia: Secondary | ICD-10-CM | POA: Diagnosis not present

## 2021-09-11 DIAGNOSIS — R0902 Hypoxemia: Secondary | ICD-10-CM | POA: Insufficient documentation

## 2021-09-11 DIAGNOSIS — I509 Heart failure, unspecified: Secondary | ICD-10-CM | POA: Insufficient documentation

## 2021-09-11 DIAGNOSIS — Z23 Encounter for immunization: Secondary | ICD-10-CM

## 2021-09-11 MED ORDER — HYDROCORTISONE 2.5 % EX CREA: TOPICAL_CREAM | Freq: Two times a day (BID) | CUTANEOUS | 0 refills | Status: DC

## 2021-09-11 NOTE — Progress Notes (Signed)
Subjective:    Patient ID: Taylor Jenkins, female    DOB: [birth year], 81 y.o.   MRN: [id number]  DOS:  09/11/2021 Type of visit - description: Acute, here with her daughter Jenny Reichmann who is a Marine scientist.  The patient has several concerns but we noted the oxygen to be quite low so we concentrated on that fact today. States that she had a TVAR 11/2018. Had a COVID infection 11/2020 and since then she is not feeling well. Was seen at this office 04/10/2021, at the time she informed the provider that she was requiring oxygen when she goes to hematology. Work-up included a BNP of 511, hemoglobin 11.3, Normal CMP except for low blood sugar. Chest x-ray cardiomegaly and vascular congestion.  Possible pneumonia/atelectasis Ultrasound both legs with no DVT. She was referred to pulmonary but that was not pursued. Was treated empirically with antibiotics. Since then she got a little better but states that she is consistently DOE.  The daughter check oxygens at home, she desat to the 80s% with exertion and at rest is about 96%. Daughter has noticed some wheezing.  No recent fever chills No chest pain + Edema + Weight gain in the last few months + Palpitation Orthopnea?  She has been sleeping in 5 pillows since she got COVID 11-2020. No cough. No history of tobacco abuse, asthma or COPD   Review of Systems See above   Past Medical History:  Diagnosis Date   Anemia    Aortic atherosclerosis ([place]) 04/29/2017   Asthma    Atrial fibrillation, persistent ([facility])    AVM (arteriovenous malformation) of duodenum, acquired with hemorrhage 11/22/2020   CAD (coronary artery disease) 2016   non-obstructive at cath   Chronic diastolic CHF (congestive heart failure) ([place])    Diabetes mellitus without complication ([place])    type 2   HLD (hyperlipidemia)    Hypertension    Iron deficiency anemia due to chronic blood loss 11/22/2020   S/P TAVR (transcatheter aortic valve replacement)    Severe aortic stenosis  04/26/2017    Past Surgical History:  Procedure Laterality Date   LEFT HEART CATHETERIZATION WITH CORONARY ANGIOGRAM N/A 09/27/2014   Non-obstructive disease, CRF reduction recommended. Procedure: LEFT HEART CATHETERIZATION WITH CORONARY ANGIOGRAM;  Surgeon: Blane Ohara, MD;  Location: [facility] CATH LAB;  Service: Cardiovascular;  Laterality: N/A;   RIGHT/LEFT HEART CATH AND CORONARY ANGIOGRAPHY N/A 10/30/2018   Procedure: RIGHT/LEFT HEART CATH AND CORONARY ANGIOGRAPHY;  Surgeon: Burnell Blanks, MD;  Location: [facility] CV LAB;  Service: Cardiovascular;  Laterality: N/A;   TEE WITHOUT CARDIOVERSION N/A 11/18/2018   Procedure: TRANSESOPHAGEAL ECHOCARDIOGRAM (TEE);  Surgeon: Burnell Blanks, MD;  Location: [facility] CV LAB;  Service: Open Heart Surgery;  Laterality: N/A;   TRANSCATHETER AORTIC VALVE REPLACEMENT, TRANSFEMORAL N/A 11/18/2018   Procedure: TRANSCATHETER AORTIC VALVE REPLACEMENT, TRANSFEMORAL;  Surgeon: Burnell Blanks, MD;  Location: [facility] LAB;  Service: Open Heart Surgery;  Laterality: N/A;   TUBAL LIGATION      Current Outpatient Medications  Medication Instructions   allopurinol (ZYLOPRIM) 100 mg, Oral, Daily   calcium carbonate (TUMS EX) 750 MG chewable tablet 1 tablet, Oral, As needed   ELIQUIS 5 MG TABS tablet TAKE 1 TABLET BY MOUTH TWICE A DAY   ferrous sulfate 325 mg, Oral, Daily   furosemide (LASIX) 20 MG tablet TAKE 1 TABLET (20MG ) BY MOUTH DAILY. OK TO TAKE EXTRA TABLET DAILY AS NEEDED   furosemide (LASIX) 40 MG tablet 1  tablet, Oral, Daily   insulin NPH Human (NOVOLIN N) 50 Units, Subcutaneous, 2 times daily   levalbuterol (XOPENEX HFA) 45 MCG/ACT inhaler INHALE 1 TO 2 PUFFS BY MOUTH EVERY 4 HOURS AS NEEDED FOR WHEEZE   losartan (COZAAR) 25 mg, Oral, Daily   metoprolol tartrate (LOPRESSOR) 25 mg, Oral, 2 times daily   Multiple Vitamin (MULTIVITAMIN ADULT PO) 1 tablet, Oral, Daily   nitroGLYCERIN (NITROSTAT) 0.4 mg, Sublingual, Every  5 min x3 PRN   nystatin (MYCOSTATIN/NYSTOP) powder 1 application, Topical, Once PRN   potassium chloride (KLOR-CON) 10 MEQ tablet 10 mEq, Oral, Daily   venlafaxine XR (EFFEXOR-XR) 37.5 MG 24 hr capsule TAKE 1 CAPSULE BY MOUTH DAILY WITH BREAKFAST.   vitamin C (ASCORBIC ACID) 250 mg, Oral, Daily   zinc gluconate 50 mg, Oral, Daily       Objective:   Physical Exam BP (!) 142/68 (BP Location: Right Arm, Patient Position: Sitting, Cuff Size: Normal)    Pulse 85    Temp 97.8 F (36.6 C) (Oral)    Resp 20    Ht 4\' 9"  (1.448 m)    Wt 238 lb 8 oz (108.2 kg)    SpO2 (!) 88%    BMI 51.61 kg/m  General:   Well developed, NAD, BMI noted. HEENT:  Normocephalic . Face symmetric, atraumatic Lungs:  Decreased breath sounds Normal respiratory effort, no intercostal retractions, no accessory muscle use. Heart: Irregular, no murmur  lower extremities: Skin on the pretibial area sees thick, leathery, has papular indurations. Skin: Not pale. Not jaundice Neurologic:  alert & oriented X3.  Speech normal, gait not tested  psych--  Cognition and judgment appear intact.  Cooperative with normal attention span and concentration.  Behavior appropriate. No anxious or depressed appearing decreased breath sounds.      Assessment   81 year old female, PMH includes CAD, HTN, high cholesterol, atrial fibrillation anticoagulated, history of TVAR, history of CHF, diabetes, presents with   Hypoxia Presents with hypoxia, chronic. Upon arrival to the office, O2 sat was 75%.  After resting for few minutes, O2 sat was 88% Recent labs show normal renal function.  Last hemoglobin 14.3. We had a long discussion about this issue with the patient and her daughter.  Although I am quite concerned about the low oxygen, this seems to be a chronic issue thus will do the following: Start oxygen 24/7 ASAP, order sent.  Anticipate O2 sat will go up. Refer to pulmonary. A. fib, history of TVAR: Has not seen cardiology in a  while, h/o  heart murmur after TVAR (I did not hear one today). Plan: Referral to cardiology. Weight gain noted, patient thinks is dietary, it could be also fluid retention.  Will get a chest x-ray to better assess her volume status. Chronic dermatitis: Has chronic type of changes of the skin at both lower extremities, recommend to use a moisturizer consistently, also hydrocortisone 2.5% as needed. Requested flu shot: Okay to proceed See primary provider next month  Time spent with the patient and her daughter 32 minutes.  I review her chart, discussed the fact that she is hypoxic but does not need ER visit since this is a chronic problem.  They verbalized understanding.   This visit occurred during the SARS-CoV-2 public health emergency.  Safety protocols were in place, including screening questions prior to the visit, additional usage of staff PPE, and extensive cleaning of exam room while observing appropriate contact time as indicated for disinfecting solutions.

## 2021-09-11 NOTE — Patient Instructions (Signed)
°  Proceed with a chest x-ray downstairs  We are ordering oxygen to be delivered as soon as possible.  We are referring you to cardiology for a routine visit  We are referring you to pulmonary. Please call them at [phone]  Please to schedule a follow-up with your primary provider for next month

## 2021-09-11 NOTE — Telephone Encounter (Signed)
Chest x-ray with evidence of acute CHF. Currently on Lasix 60 mg daily. I called the patient's home phone number, spoke with Misty. Plan: Increase Lasix to 40 mg twice daily until she sees cardiology.

## 2021-09-11 NOTE — Telephone Encounter (Signed)
Pt has an appt today 09/11/21 °

## 2021-09-11 NOTE — Telephone Encounter (Signed)
Spoke with Taylor Asp stated she is going back to work this week  and she stated she will have to talk to [facility] about bringing her next week and she will have Misty call tomorrow to schedule

## 2021-09-11 NOTE — Telephone Encounter (Signed)
Will you get pt scheduled for follow up with me in about one week.saw dr Drue Novel today.

## 2021-09-11 NOTE — Progress Notes (Signed)
SATURATION QUALIFICATIONS: (This note is used to comply with regulatory documentation for home oxygen)  Patient Saturations on Room Air at Rest = 88%  Patient Saturations on Room Air while Ambulating = 75%  Patient Saturations on  Liters of oxygen while Ambulating = %  Please briefly explain why patient needs home oxygen:

## 2021-09-12 NOTE — Progress Notes (Signed)
Cardiology Office Note:    Date:  09/13/2021   ID:  Taylor Jenkins, DOB 1941/05/24, MRN [id number]  PCP:  Esperanza Richters, PA-C   [facility] Providers Cardiologist:  Chrystie Nose, MD      Referring MD: Wanda Plump, MD   Follow-up for atrial fibrillation and CHF  History of Present Illness:    Taylor Jenkins is a 81 y.o. female with a hx of aortic stenosis status post TAVR 3/20, HTN, diastolic CHF, insulin-dependent diabetes mellitus, permanent atrial fibrillation on Eliquis, chronic anemia, prior AVMs clipped in 2018, and mild-moderate nonobstructive CAD by catheterization 2/20.  She was seen in the atrial fibrillation clinic 02/07/2021.  She had contacted the [employer] office with complaints of weakness and shortness of breath after having a COVID infection in early May.  It was noted that her atrial fibrillation continued to be rate controlled.  She did appear to be somewhat fluid volume overloaded.  She reported significant shortness of breath with minimal physical activity.  On review of her echocardiogram that showed left ventricular hypertrophy.  Her increased work of breathing was her main complaint and was new for her.  It was felt that her shortness of breath preceded her COVID infection.  Of note her hemoglobin was 7.4 in May and she received 2 units of blood and iron infusion at that time.  On recheck her hemoglobin was 14.3 on 08/30/2021.  She denied fever and chills.  Her medications were continued.  She presents to the clinic today for follow-up evaluation states she was seen 2 days ago by Dr. Drue Novel who increased her furosemide.  She reports that she has gained around 20 pounds over the last 3 months.  She reports that she has not been eating large quantities of food because she has a poor appetite.  She has been drinking about 80-85 ounces of liquid per day.  She stopped weighing herself because she was frustrated with her weight gain.  We reviewed her previous  echocardiogram.  Her daughter RN presents with her and reports she occasionally gives her an extra dose of metoprolol for increased heart rate.  She reports she was started on home O2 and has been using it through the day with activity (2 L).  We reviewed the importance of daily weights, fluid restriction, low-salt diet, and physical activity.  I will order a BMP in 1 week, continue current furosemide dosing, plan follow-up in 1 month, and give the salty 6 diet sheet.  Today she denies chest pain, palpitations, melena, hematuria, hemoptysis, diaphoresis, weakness, presyncope, syncope, orthopnea, and PND.   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   CAD (coronary artery disease) 2016   non-obstructive at cath   Chronic diastolic CHF (congestive heart failure) (HCC)    Diabetes mellitus without complication (HCC)    type 2   HLD (hyperlipidemia)    Hypertension    Iron deficiency anemia due to chronic blood loss 11/22/2020   S/P TAVR (transcatheter aortic valve replacement)    Severe aortic stenosis 04/26/2017    Past Surgical History:  Procedure Laterality Date   LEFT HEART CATHETERIZATION WITH CORONARY ANGIOGRAM N/A 09/27/2014   Non-obstructive disease, CRF reduction recommended. Procedure: LEFT HEART CATHETERIZATION WITH CORONARY ANGIOGRAM;  Surgeon: Micheline Chapman, MD;  Location: [facility] CATH  LAB;  Service: Cardiovascular;  Laterality: N/A;   RIGHT/LEFT HEART CATH AND CORONARY ANGIOGRAPHY N/A 10/30/2018   Procedure: RIGHT/LEFT HEART CATH AND CORONARY ANGIOGRAPHY;  Surgeon: Burnell Blanks, MD;  Location: [facility] LAB;  Service: Cardiovascular;  Laterality: N/A;   TEE WITHOUT CARDIOVERSION N/A 11/18/2018   Procedure: TRANSESOPHAGEAL ECHOCARDIOGRAM (TEE);  Surgeon: Burnell Blanks, MD;   Location: [facility] LAB;  Service: Open Heart Surgery;  Laterality: N/A;   TRANSCATHETER AORTIC VALVE REPLACEMENT, TRANSFEMORAL N/A 11/18/2018   Procedure: TRANSCATHETER AORTIC VALVE REPLACEMENT, TRANSFEMORAL;  Surgeon: Burnell Blanks, MD;  Location: [facility] LAB;  Service: Open Heart Surgery;  Laterality: N/A;   TUBAL LIGATION      Current Medications: Current Meds  Medication Sig   allopurinol (ZYLOPRIM) 100 MG tablet Take 1 tablet (100 mg total) by mouth daily.   calcium carbonate (TUMS EX) 750 MG chewable tablet Chew 1 tablet by mouth as needed for heartburn.    ELIQUIS 5 MG TABS tablet TAKE 1 TABLET BY MOUTH TWICE A DAY   ferrous sulfate 325 (65 FE) MG tablet Take 325 mg by mouth daily.   furosemide (LASIX) 20 MG tablet TAKE 1 TABLET (20MG ) BY MOUTH DAILY. OK TO TAKE EXTRA TABLET DAILY AS NEEDED   furosemide (LASIX) 40 MG tablet Take 1 tablet by mouth daily.   hydrocortisone 2.5 % cream Apply topically 2 (two) times daily.   insulin NPH Human (NOVOLIN N) 100 UNIT/ML injection Inject 50 Units into the skin in the morning and at bedtime.    levalbuterol (XOPENEX HFA) 45 MCG/ACT inhaler INHALE 1 TO 2 PUFFS BY MOUTH EVERY 4 HOURS AS NEEDED FOR WHEEZE   losartan (COZAAR) 25 MG tablet Take 1 tablet (25 mg total) by mouth daily.   metoprolol tartrate (LOPRESSOR) 25 MG tablet Take 1 tablet (25 mg total) by mouth 2 (two) times daily.   Multiple Vitamin (MULTIVITAMIN ADULT PO) Take 1 tablet by mouth daily.   nitroGLYCERIN (NITROSTAT) 0.4 MG SL tablet Place 1 tablet (0.4 mg total) under the tongue every 5 (five) minutes x 3 doses as needed for chest pain (or severe SOB).   nystatin (MYCOSTATIN/NYSTOP) powder Apply 1 application topically once as needed (for skin iritation).   potassium chloride (KLOR-CON) 10 MEQ tablet Take 1 tablet (10 mEq total) by mouth daily.   vitamin C (ASCORBIC ACID) 250 MG tablet Take 250 mg by mouth daily.   zinc gluconate 50 MG tablet Take 50 mg by  mouth daily.     Allergies:   Codeine, Epinephrine, Penicillins, Prednisone, Statins, Lidocaine, and Sulfa antibiotics   Social History   Socioeconomic History   Marital status: Widowed    Spouse name: Not on file   Number of children: 4   Years of education: Not on file   Highest education level: Not on file  Occupational History   Occupation: Retired-Secretary  Tobacco Use   Smoking status: Never   Smokeless tobacco: Never  Vaping Use   Vaping Use: Never used  Substance and Sexual Activity   Alcohol use: No    Alcohol/week: 0.0 standard drinks   Drug use: No   Sexual activity: Not on file  Other Topics Concern   Not on file  Social History Narrative   Pt lives alone in [place].    Social Determinants of Health   Financial Resource Strain: Not on file  Food Insecurity: Not on file  Transportation Needs: Not on file  Physical Activity: Not on  file  Stress: Not on file  Social Connections: Not on file     Family History: The patient's family history includes Heart attack (age of onset: 49) in her brother; Transient ischemic attack in her mother.  ROS:   Please see the history of present illness.     All other systems reviewed and are negative.   Risk Assessment/Calculations:    CHA2DS2-VASc Score = 6    This indicates a 9.7% annual risk of stroke. The patient's score is based upon: CHF History: 1 HTN History: 1 Diabetes History: 1 Stroke History: 0 Vascular Disease History: 0 Age Score: 2 Gender Score: 1          Physical Exam:    VS:  BP 112/64    Pulse (!) 59    Ht 4' 10.8" (1.494 m)    Wt 238 lb 11.2 oz (108.3 kg)    SpO2 95%    BMI 48.54 kg/m     Wt Readings from Last 3 Encounters:  09/13/21 238 lb 11.2 oz (108.3 kg)  09/11/21 238 lb 8 oz (108.2 kg)  08/30/21 238 lb 0.6 oz (108 kg)     GEN:  Well nourished, well developed in no acute distress HEENT: Normal NECK: No JVD; No carotid bruits LYMPHATICS: No lymphadenopathy CARDIAC:  Irregularly irregular, no murmurs, rubs, gallops RESPIRATORY:  Clear to auscultation without rales, wheezing or rhonchi  ABDOMEN: Soft, non-tender, non-distended MUSCULOSKELETAL:  No edema; No deformity  SKIN: Warm and dry NEUROLOGIC:  Alert and oriented x 3 PSYCHIATRIC:  Normal affect    EKGs/Labs/Other Studies Reviewed:    The following studies were reviewed today: Echocardiogram 03/20/2021  IMPRESSIONS     1. Left ventricular ejection fraction, by estimation, is 55 to 60%. The  left ventricle has normal function. The left ventricle has no regional  wall motion abnormalities. The left ventricular internal cavity size was  mildly dilated. Left ventricular  diastolic function could not be evaluated.   2. The right ventricle is poorly seen, but appears to be dilated and at  least moderately depressed. Right ventricular systolic function was not  well visualized. The right ventricular size is not well visualized.   3. Left atrial size was severely dilated.   4. The mitral valve is degenerative. No evidence of mitral valve  regurgitation. Mild mitral stenosis. The mean mitral valve gradient is 5.0  mmHg with average heart rate of 75 bpm. Moderate to severe mitral annular  calcification.   5. The perivalvular leak is in the 2 o'clock location, in the vicinity of  the left coronary artery ostium. The aortic valve has been  repaired/replaced. Aortic valve regurgitation is mild to moderate. There  is a 23 mm Sapien prosthetic (TAVR) valve  present in the aortic position. Procedure Date: 11/18/2018. Echo findings  are consistent with perivalvular leak of the aortic prosthesis. Aortic  valve mean gradient measures 12.9 mmHg. Aortic valve Vmax measures 2.48  m/s. Aortic valve acceleration time  measures 85 msec.   Comparison(s): Prior images reviewed side by side. The perivalvular leak  is in a similar location, but appears a little worse.   EKG: None today.  Recent Labs: 02/07/2021:  TSH 2.144 04/10/2021: Brain Natriuretic Peptide 511 08/30/2021: ALT 27; BUN 20; Creatinine 0.96; Hemoglobin 14.3; Platelet Count 181; Potassium 3.8; Sodium 141  Recent Lipid Panel    Component Value Date/Time   CHOL 219 (H) 09/26/2014 0030   TRIG 231 (H) 09/26/2014 0030   HDL 43 09/26/2014 0030  CHOLHDL 5.1 09/26/2014 0030   VLDL 46 (H) 09/26/2014 0030   LDLCALC 130 (H) 09/26/2014 0030    ASSESSMENT & PLAN    Diastolic CHF-reports increased work of breathing x2-3 months.  Was seen by Dr. Larose Kells 2 days ago felt that she was fluid volume overloaded and increased furosemide.  Echocardiogram 03/20/2021 showed normal LVEF. Continue furosemide, losartan, metoprolol, potassium Heart healthy low-sodium diet-salty 6 given Increase physical activity as tolerated Order BMP in 1 week-[facility] Daily weights-contact office with a weight increase of 2 pounds overnight or 5 pounds in a week Fluid restriction-64 ounces Lower extremity support stockings-aspirin support stocking sheet given  Atrial fibrillation-heart rate today 59.  No recent episodes of accelerated heart rate.  CHA2DS2-VASc score 6-7.  Reports compliance with apixaban and denies bleeding issues. Continue apixaban, metoprolol Heart healthy low-sodium diet Increase physical activity as tolerated  Aortic valve stenosis-status post TAVR 2020.  Echocardiogram 7/22 showed mild-moderate aortic valve regurgitation. Follows with structural heart team  Normocytic normochromic anemia-hemoglobin 12/22 was 14.3.  Denies bleeding issues. Follows with PCP   Disposition: Follow-up with Dr. Debara Pickett or me in 1 month       Medication Adjustments/Labs and Tests Ordered: Current medicines are reviewed at length with the patient today.  Concerns regarding medicines are outlined above.  Orders Placed This Encounter  Procedures   Basic Metabolic Panel (BMET)   No orders of the defined types were placed in this encounter.   Patient Instructions   Medication Instructions:  Continue current medications  *If you need a refill on your cardiac medications before your next appointment, please call your pharmacy*   Lab Work: BMP in 1 week  If you have labs (blood work) drawn today and your tests are completely normal, you will receive your results only by: [place] (if you have MyChart) OR A paper copy in the mail If you have any lab test that is abnormal or we need to change your treatment, we will call you to review the results.   Testing/Procedures: None Ordered   Follow-Up: At [facility], you and your health needs are our priority.  As part of our continuing mission to provide you with exceptional heart care, we have created designated Provider Care Teams.  These Care Teams include your primary Cardiologist (physician) and Advanced Practice Providers (APPs -  Physician Assistants and Nurse Practitioners) who all work together to provide you with the care you need, when you need it.  We recommend signing up for the patient portal called "MyChart".  Sign up information is provided on this After Visit Summary.  MyChart is used to connect with patients for Virtual Visits (Telemedicine).  Patients are able to view lab/test results, encounter notes, upcoming appointments, etc.  Non-urgent messages can be sent to your provider as well.   To learn more about what you can do with MyChart, go to [email or web address].    Your next appointment:   1 month(s)  The format for your next appointment:   In Person  Provider:   Coletta Memos, NP    Other Instructions Weigh daily Call [phone] if weight climbs more than 3 pounds in a day or 5 pounds in a week. No salt to very little salt in your diet.  No more than 2000 mg in a day. Call if increased shortness of breath or increased swelling.        Signed, Deberah Pelton, NP  09/13/2021 4:15 PM      Notice: This dictation was  prepared with Dragon dictation along  with smaller phrase technology. Any transcriptional errors that result from this process are unintentional and may not be corrected upon review.  I spent 15 minutes examining this patient, reviewing medications, and using patient centered shared decision making involving her cardiac care.  Prior to her visit I spent greater than 20 minutes reviewing her past medical history,  medications, and prior cardiac tests.

## 2021-09-13 ENCOUNTER — Ambulatory Visit (HOSPITAL_BASED_OUTPATIENT_CLINIC_OR_DEPARTMENT_OTHER): Payer: Medicare HMO | Admitting: General Practice

## 2021-09-13 ENCOUNTER — Encounter (HOSPITAL_BASED_OUTPATIENT_CLINIC_OR_DEPARTMENT_OTHER): Payer: Self-pay | Admitting: General Practice

## 2021-09-13 ENCOUNTER — Other Ambulatory Visit: Payer: Self-pay

## 2021-09-13 VITALS — BP 112/64 | HR 59 | Ht 58.8 in | Wt 238.7 lb

## 2021-09-13 DIAGNOSIS — I5032 Chronic diastolic (congestive) heart failure: Secondary | ICD-10-CM

## 2021-09-13 DIAGNOSIS — I4819 Other persistent atrial fibrillation: Secondary | ICD-10-CM

## 2021-09-13 DIAGNOSIS — Z952 Presence of prosthetic heart valve: Secondary | ICD-10-CM | POA: Diagnosis not present

## 2021-09-13 DIAGNOSIS — D500 Iron deficiency anemia secondary to blood loss (chronic): Secondary | ICD-10-CM | POA: Diagnosis not present

## 2021-09-13 NOTE — Patient Instructions (Signed)
Medication Instructions:  Continue current medications  *If you need a refill on your cardiac medications before your next appointment, please call your pharmacy*   Lab Work: BMP in 1 week  If you have labs (blood work) drawn today and your tests are completely normal, you will receive your results only by: [place] (if you have MyChart) OR A paper copy in the mail If you have any lab test that is abnormal or we need to change your treatment, we will call you to review the results.   Testing/Procedures: None Ordered   Follow-Up: At [facility], you and your health needs are our priority.  As part of our continuing mission to provide you with exceptional heart care, we have created designated Provider Care Teams.  These Care Teams include your primary Cardiologist (physician) and Advanced Practice Providers (APPs -  Physician Assistants and Nurse Practitioners) who all work together to provide you with the care you need, when you need it.  We recommend signing up for the patient portal called "MyChart".  Sign up information is provided on this After Visit Summary.  MyChart is used to connect with patients for Virtual Visits (Telemedicine).  Patients are able to view lab/test results, encounter notes, upcoming appointments, etc.  Non-urgent messages can be sent to your provider as well.   To learn more about what you can do with MyChart, go to [email or web address].    Your next appointment:   1 month(s)  The format for your next appointment:   In Person  Provider:   Coletta Memos, NP    Other Instructions Weigh daily Call [phone] if weight climbs more than 3 pounds in a day or 5 pounds in a week. No salt to very little salt in your diet.  No more than 2000 mg in a day. Call if increased shortness of breath or increased swelling.

## 2021-09-14 ENCOUNTER — Inpatient Hospital Stay: Payer: Medicare HMO

## 2021-09-14 VITALS — BP 139/76 | HR 54 | Temp 97.9°F | Resp 20

## 2021-09-14 DIAGNOSIS — D508 Other iron deficiency anemias: Secondary | ICD-10-CM | POA: Diagnosis not present

## 2021-09-14 DIAGNOSIS — D500 Iron deficiency anemia secondary to blood loss (chronic): Secondary | ICD-10-CM

## 2021-09-14 DIAGNOSIS — K31811 Angiodysplasia of stomach and duodenum with bleeding: Secondary | ICD-10-CM

## 2021-09-14 MED ORDER — SODIUM CHLORIDE 0.9 % IV SOLN: Freq: Once | INTRAVENOUS | Status: AC

## 2021-09-14 MED ORDER — SODIUM CHLORIDE 0.9 % IV SOLN
300.0000 mg | Freq: Once | INTRAVENOUS | Status: AC
  Administered 2021-09-14: 300 mg via INTRAVENOUS
  Filled 2021-09-14: qty 200

## 2021-09-14 NOTE — Patient Instructions (Signed)

## 2021-09-20 LAB — BASIC METABOLIC PANEL
BUN/Creatinine Ratio: 22 (ref 12–28)
BUN: 22 mg/dL (ref 8–27)
CO2: 22 mmol/L (ref 20–29)
Calcium: 9.2 mg/dL (ref 8.7–10.3)
Chloride: 100 mmol/L (ref 96–106)
Creatinine, Ser: 0.99 mg/dL (ref 0.57–1.00)
Glucose: 149 mg/dL — ABNORMAL HIGH (ref 70–99)
Potassium: 4.2 mmol/L (ref 3.5–5.2)
Sodium: 140 mmol/L (ref 134–144)
eGFR: 58 mL/min/{1.73_m2} — ABNORMAL LOW (ref >59)

## 2021-10-11 ENCOUNTER — Ambulatory Visit (INDEPENDENT_AMBULATORY_CARE_PROVIDER_SITE_OTHER): Payer: Medicare HMO | Admitting: Medical

## 2021-10-11 ENCOUNTER — Other Ambulatory Visit: Payer: Self-pay

## 2021-10-11 ENCOUNTER — Ambulatory Visit (HOSPITAL_BASED_OUTPATIENT_CLINIC_OR_DEPARTMENT_OTHER)
Admission: RE | Admit: 2021-10-11 | Discharge: 2021-10-11 | Disposition: A | Discharge status: Home / Self Care | Payer: Medicare HMO | Source: Ambulatory Visit | Attending: Medical | Admitting: Medical

## 2021-10-11 VITALS — BP 130/72 | HR 72 | Temp 97.1°F | Resp 18 | Ht <= 58 in | Wt 229.0 lb

## 2021-10-11 DIAGNOSIS — I509 Heart failure, unspecified: Secondary | ICD-10-CM | POA: Diagnosis not present

## 2021-10-11 DIAGNOSIS — R0600 Dyspnea, unspecified: Secondary | ICD-10-CM

## 2021-10-11 DIAGNOSIS — I4819 Other persistent atrial fibrillation: Secondary | ICD-10-CM

## 2021-10-11 DIAGNOSIS — J811 Chronic pulmonary edema: Secondary | ICD-10-CM | POA: Diagnosis not present

## 2021-10-11 DIAGNOSIS — L089 Local infection of the skin and subcutaneous tissue, unspecified: Secondary | ICD-10-CM

## 2021-10-11 DIAGNOSIS — I350 Nonrheumatic aortic (valve) stenosis: Secondary | ICD-10-CM

## 2021-10-11 DIAGNOSIS — D649 Anemia, unspecified: Secondary | ICD-10-CM

## 2021-10-11 DIAGNOSIS — I517 Cardiomegaly: Secondary | ICD-10-CM | POA: Diagnosis not present

## 2021-10-11 LAB — COMPREHENSIVE METABOLIC PANEL
ALT: 42 U/L — ABNORMAL HIGH (ref 0–35)
AST: 42 U/L — ABNORMAL HIGH (ref 0–37)
Albumin: 4.1 g/dL (ref 3.5–5.2)
Alkaline Phosphatase: 141 U/L — ABNORMAL HIGH (ref 39–117)
BUN: 21 mg/dL (ref 6–23)
CO2: 34 mEq/L — ABNORMAL HIGH (ref 19–32)
Calcium: 10 mg/dL (ref 8.4–10.5)
Chloride: 97 mEq/L (ref 96–112)
Creatinine, Ser: 1.09 mg/dL (ref 0.40–1.20)
GFR: 48.37 mL/min — ABNORMAL LOW (ref >60.00)
Glucose, Bld: 69 mg/dL — ABNORMAL LOW (ref 70–99)
Potassium: 3.7 mEq/L (ref 3.5–5.1)
Sodium: 140 mEq/L (ref 135–145)
Total Bilirubin: 0.8 mg/dL (ref 0.2–1.2)
Total Protein: 8.2 g/dL (ref 6.0–8.3)

## 2021-10-11 LAB — BRAIN NATRIURETIC PEPTIDE: Pro B Natriuretic peptide (BNP): 466 pg/mL — ABNORMAL HIGH (ref 0.0–100.0)

## 2021-10-11 MED ORDER — TRAMADOL HCL 50 MG PO TABS
50.0000 mg | ORAL_TABLET | Freq: Four times a day (QID) | ORAL | 0 refills | Status: AC | PRN
Start: 2021-10-11 — End: 2021-10-16

## 2021-10-11 MED ORDER — DOXYCYCLINE HYCLATE 100 MG PO TABS: 100.0000 mg | ORAL_TABLET | Freq: Two times a day (BID) | ORAL | 0 refills | Status: DC

## 2021-10-11 NOTE — Patient Instructions (Addendum)
Congestive heart failure, atrial fibrillation and aortic stenosis.  Will get chest x-ray today, CMP and BNP.  With recent increase of Lasix do want to evaluate for weight potassium level.  Continue oxygen, Eliquis and losartan.  Anemia-last hemoglobin hematocrit late December was stable.  Follow-up with Dr. Marin Olp.  Bilateral lower extremity diffuse scattered scabs.  Right side pretibial region worse than the left.  I took a culture from to the areas that are most tender.  During the interim prescribe doxycycline antibiotic.  Rx advisement given.  Moderate to severe at times in the pretibial regions.  Unable to take NSAIDs due to Eliquis use.  Decided to prescribe a brace a low number prescription of tramadol.  Rx advisement given.  Notes used some recently and had no adverse side effects.  Follow-up date to be determined after lab review.

## 2021-10-11 NOTE — Progress Notes (Signed)
Subjective:    Patient ID: Taylor Jenkins, female    DOB: 1940/10/16, 81 y.o.   MRN: [id number]  HPI  Pt in foday for follow up. Met first time back in august 2022.  Pt o2 sat baseline 91-93%. Pt states uses 2.5 L at home.   January she saw cardiologist. And will see them again 2-29-2023.  Follow-up for atrial fibrillation and CHF   "Diastolic CHF-reports increased work of breathing x2-3 months.  Was seen by Dr. Larose Kells 2 days ago felt that she was fluid volume overloaded and increased furosemide.  Echocardiogram 03/20/2021 showed normal LVEF. Continue furosemide, losartan, metoprolol, potassium Heart healthy low-sodium diet-salty 6 given Increase physical activity as tolerated Order BMP in 1 week-[facility] Daily weights-contact office with a weight increase of 2 pounds overnight or 5 pounds in a week Fluid restriction-64 ounces Lower extremity support stockings-aspirin support stocking sheet given   Atrial fibrillation-heart rate today 59.  No recent episodes of accelerated heart rate.  CHA2DS2-VASc score 6-7.  Reports compliance with apixaban and denies bleeding issues. Continue apixaban, metoprolol Heart healthy low-sodium diet Increase physical activity as tolerated   Aortic valve stenosis-status post TAVR 2020.  Echocardiogram 7/22 showed mild-moderate aortic valve regurgitation. Follows with structural heart team   Normocytic normochromic anemia-hemoglobin 12/22 was 14.3.  Denies bleeding issues. Follows with PCP"  Pt is on lasix 60 mg in am and 20 mg pm. Pt states this is change made by cardiologist.  Pt on eliquis 5 mg twice a day.  Losartan 25 mg daily.  Occasionally has to use xopenex.  Pt had iron deficiency anemia. Sees Dr.Ennever coming up on November 28, 2020.  Rt lower extremity redness, scabs and pain. No fever. Pt took tramadol 50 mg tab which was her dogs. No adverse side effects. No dizzineess. Children want her to have something for severe pain.    Review  of Systems  Constitutional:  Negative for chills, fatigue and fever.  HENT:  Negative for congestion, drooling, ear discharge and ear pain.   Respiratory:  Negative for cough, chest tightness, shortness of breath and wheezing.        Stable baseline.  Cardiovascular:  Negative for chest pain and palpitations.  Gastrointestinal:  Negative for abdominal pain, diarrhea and nausea.  Genitourinary:  Negative for dysuria, flank pain and frequency.  Musculoskeletal:  Negative for back pain and joint swelling.  Skin:  Negative for rash.  Neurological:  Negative for dizziness, speech difficulty, weakness, numbness and headaches.  Hematological:  Negative for adenopathy. Does not bruise/bleed easily.  Psychiatric/Behavioral:  Negative for behavioral problems, confusion and dysphoric mood.     Past Medical History:  Diagnosis Date   Anemia    Aortic atherosclerosis ([place]) 04/29/2017   Asthma    Atrial fibrillation, persistent ([place])    AVM (arteriovenous malformation) of duodenum, acquired with hemorrhage 11/22/2020   CAD (coronary artery disease) 2016   non-obstructive at cath   Chronic diastolic CHF (congestive heart failure) ([place])    Diabetes mellitus without complication (HCC)    type 2   HLD (hyperlipidemia)    Hypertension    Iron deficiency anemia due to chronic blood loss 11/22/2020   S/P TAVR (transcatheter aortic valve replacement)    Severe aortic stenosis 04/26/2017     Social History   Socioeconomic History   Marital status: Widowed    Spouse name: Not on file   Number of children: 4   Years of education: Not on file   Highest  education level: Not on file  Occupational History   Occupation: Retired-Secretary  Tobacco Use   Smoking status: Never   Smokeless tobacco: Never  Vaping Use   Vaping Use: Never used  Substance and Sexual Activity   Alcohol use: No    Alcohol/week: 0.0 standard drinks   Drug use: No   Sexual activity: Not on file  Other Topics Concern   Not  on file  Social History Narrative   Pt lives alone in [place].    Social Determinants of Health   Financial Resource Strain: Not on file  Food Insecurity: Not on file  Transportation Needs: Not on file  Physical Activity: Not on file  Stress: Not on file  Social Connections: Not on file  Intimate Partner Violence: Not on file    Past Surgical History:  Procedure Laterality Date   LEFT HEART CATHETERIZATION WITH CORONARY ANGIOGRAM N/A 09/27/2014   Non-obstructive disease, CRF reduction recommended. Procedure: LEFT HEART CATHETERIZATION WITH CORONARY ANGIOGRAM;  Surgeon: Blane Ohara, MD;  Location: [facility] CATH LAB;  Service: Cardiovascular;  Laterality: N/A;   RIGHT/LEFT HEART CATH AND CORONARY ANGIOGRAPHY N/A 10/30/2018   Procedure: RIGHT/LEFT HEART CATH AND CORONARY ANGIOGRAPHY;  Surgeon: Burnell Blanks, MD;  Location: [facility] CV LAB;  Service: Cardiovascular;  Laterality: N/A;   TEE WITHOUT CARDIOVERSION N/A 11/18/2018   Procedure: TRANSESOPHAGEAL ECHOCARDIOGRAM (TEE);  Surgeon: Burnell Blanks, MD;  Location: [facility] CV LAB;  Service: Open Heart Surgery;  Laterality: N/A;   TRANSCATHETER AORTIC VALVE REPLACEMENT, TRANSFEMORAL N/A 11/18/2018   Procedure: TRANSCATHETER AORTIC VALVE REPLACEMENT, TRANSFEMORAL;  Surgeon: Burnell Blanks, MD;  Location: [facility] CV LAB;  Service: Open Heart Surgery;  Laterality: N/A;   TUBAL LIGATION      Family History  Problem Relation Age of Onset   Transient ischemic attack Mother    Heart attack Brother 50    Allergies  Allergen Reactions   Codeine Shortness Of Breath and Itching   Epinephrine Shortness Of Breath and Palpitations   Penicillins Anaphylaxis    Did it involve swelling of the face/tongue/throat, SOB, or low BP? Yes Did it involve sudden or severe rash/hives, skin peeling, or any reaction on the inside of your mouth or nose? No Did you need to seek medical attention at a hospital or doctor's  office? Yes When did it last happen?      40+ years If all above answers are "NO", may proceed with cephalosporin use.    Prednisone Other (See Comments)    psychosis    Statins Other (See Comments)    Myalgias    Lidocaine Palpitations   Sulfa Antibiotics Rash    Current Outpatient Medications on File Prior to Visit  Medication Sig Dispense Refill   allopurinol (ZYLOPRIM) 100 MG tablet Take 1 tablet (100 mg total) by mouth daily. 90 tablet 1   calcium carbonate (TUMS EX) 750 MG chewable tablet Chew 1 tablet by mouth as needed for heartburn.      ELIQUIS 5 MG TABS tablet TAKE 1 TABLET BY MOUTH TWICE A DAY 180 tablet 1   ferrous sulfate 325 (65 FE) MG tablet Take 325 mg by mouth daily.     furosemide (LASIX) 20 MG tablet TAKE 1 TABLET (20MG) BY MOUTH DAILY. OK TO TAKE EXTRA TABLET DAILY AS NEEDED 135 tablet 0   furosemide (LASIX) 40 MG tablet Take 1 tablet by mouth daily.     hydrocortisone 2.5 % cream Apply topically 2 (two) times daily.  30 g 0   insulin NPH Human (NOVOLIN N) 100 UNIT/ML injection Inject 50 Units into the skin in the morning and at bedtime.      levalbuterol (XOPENEX HFA) 45 MCG/ACT inhaler INHALE 1 TO 2 PUFFS BY MOUTH EVERY 4 HOURS AS NEEDED FOR WHEEZE     losartan (COZAAR) 25 MG tablet Take 1 tablet (25 mg total) by mouth daily.     metoprolol tartrate (LOPRESSOR) 25 MG tablet Take 1 tablet (25 mg total) by mouth 2 (two) times daily.     Multiple Vitamin (MULTIVITAMIN ADULT PO) Take 1 tablet by mouth daily.     nitroGLYCERIN (NITROSTAT) 0.4 MG SL tablet Place 1 tablet (0.4 mg total) under the tongue every 5 (five) minutes x 3 doses as needed for chest pain (or severe SOB). 25 tablet 2   nystatin (MYCOSTATIN/NYSTOP) powder Apply 1 application topically once as needed (for skin iritation).     potassium chloride (KLOR-CON) 10 MEQ tablet Take 1 tablet (10 mEq total) by mouth daily. 90 tablet 3   venlafaxine XR (EFFEXOR-XR) 37.5 MG 24 hr capsule TAKE 1 CAPSULE BY MOUTH  DAILY WITH BREAKFAST. 90 capsule 1   vitamin C (ASCORBIC ACID) 250 MG tablet Take 250 mg by mouth daily.     zinc gluconate 50 MG tablet Take 50 mg by mouth daily.     No current facility-administered medications on file prior to visit.    BP 130/72    Pulse 72    Temp (!) 97.1 F (36.2 C) (Temporal)    Resp 18    Ht '4\' 10"'  (1.473 m)    Wt 229 lb (103.9 kg)    SpO2 93%    BMI 47.86 kg/m        Objective:   Physical Exam   General Mental Status- Alert. General Appearance- Not in acute distress.   Skin General: Color- Normal Color. Moisture- Normal Moisture.  Neck Carotid Arteries- Normal color. Moisture- Normal Moisture. No carotid bruits. No JVD.  Chest and Lung Exam Auscultation: Breath Sounds:-Normal.  Cardiovascular Auscultation:Rythm- Regular. Murmurs & Other Heart Sounds:Auscultation of the heart reveals- No Murmurs.  Abdomen Inspection:-Inspeection Normal. Palpation/Percussion:Note:No mass. Palpation and Percussion of the abdomen reveal- Non Tender, Non Distended + BS, no rebound or guarding.  Neurologic Cranial Nerve exam:- CN III-XII intact(No nystagmus), symmetric smile. Strength:- 5/5 equal and symmetric strength both upper and lower extremities.    Lower ext- scattered red/pinkish appearance to legs. Scattered scabs to both legs. Pretibial pain on both legs.    Assessment & Plan:   Patient Instructions  Congestive heart failure, atrial fibrillation and aortic stenosis.  Will get chest x-ray today, CMP and BNP.  With recent increase of Lasix do want to evaluate for weight potassium level.  Continue oxygen, Eliquis and losartan.  Anemia-last hemoglobin hematocrit late December was stable.  Follow-up with Dr. Marin Olp.  Bilateral lower extremity diffuse scattered scabs.  Right side pretibial region worse than the left.  I took a culture from to the areas that are most tender.  During the interim prescribe doxycycline antibiotic.  Rx advisement  given.  Moderate to severe at times in the pretibial regions.  Unable to take NSAIDs due to Eliquis use.  Decided to prescribe a brace a low number prescription of tramadol.  Rx advisement given.  Notes used some recently and had no adverse side effects.  Follow-up date to be determined after lab review.   Mackie Pai, PA-C

## 2021-10-14 LAB — WOUND CULTURE
MICRO NUMBER:: 12981142
SPECIMEN QUALITY:: ADEQUATE

## 2021-10-15 ENCOUNTER — Other Ambulatory Visit: Payer: Self-pay | Admitting: Internal Medicine

## 2021-10-22 ENCOUNTER — Other Ambulatory Visit: Payer: Self-pay | Admitting: Medical

## 2021-10-23 ENCOUNTER — Telehealth: Payer: Self-pay

## 2021-10-23 NOTE — Telephone Encounter (Addendum)
Patient needs tramadol enough to last til appointment 11/01/21 , pt daughter states patient is in pain and also sores are doing better but they are still there  6 tab rx sent to pharmacy  Esperanza Richters, PA-C

## 2021-10-23 NOTE — Telephone Encounter (Signed)
Pt daughter called and stated they would need to call back once they check their schedule

## 2021-10-23 NOTE — Progress Notes (Signed)
Cardiology Office Note:    Date:  10/26/2021   ID:  Taylor Jenkins, DOB 07-13-1941, MRN [id number]  PCP:  Mackie Pai, PA-C   [facility] Prov[facility] Cardiologist:  Pixie Casino, MD      Referring MD: Mackie Pai, PA-C   Follow-up for atrial fibrillation and CHF  History of Present Illness:    Taylor Jenkins is a 81 y.o. female with a hx of aortic stenosis status post TAVR 0000000, HTN, diastolic CHF, insulin-dependent diabetes mellitus, permanent atrial fibrillation on Eliquis, chronic anemia, prior AVMs clipped in 2018, and mild-moderate nonobstructive CAD by catheterization 2/20.  She was seen in the atrial fibrillation clinic 02/07/2021.  She had contacted the [facility] office with complaints of weakness and shortness of breath after having a COVID infection in early May.  It was noted that her atrial fibrillation continued to be rate controlled.  She did appear to be somewhat fluid volume overloaded.  She reported significant shortness of breath with minimal physical activity.  On review of her echocardiogram that showed left ventricular hypertrophy.  Her increased work of breathing was her main complaint and was new for her.  It was felt that her shortness of breath preceded her COVID infection.  Of note her hemoglobin was 7.4 in May and she received 2 units of blood and iron infusion at that time.  On recheck her hemoglobin was 14.3 on 08/30/2021.  She denied fever and chills.  Her medications were continued.  She presented to the clinic 09/13/21 for follow-up evaluation stated she was seen 2 days ago by Dr. Larose Kells who increased her furosemide.  She reported that she had gained around 20 pounds over the last 3 months.  She reported that she had not been eating large quantities of food because she has a poor appetite.  She had been drinking about 80-85 ounces of liquid per day.  She stopped weighing herself because she was frustrated with her weight gain.  We reviewed her previous  echocardiogram.  Her daughter Investment banker, corporate) presents with her and reported she occasionally gaves her an extra dose of metoprolol for increased heart rate.  She reported she was started on home O2 and had been using it through the day with activity (2 L).  We reviewed the importance of daily weights, fluid restriction, low-salt diet, and physical activity.  I  ordered a BMP in 1 week, continued current furosemide dosing, planned follow-up in 1 month, and gave the salty 6 diet sheet.  She presents to the clinic today for follow-up evaluation states she is feeling much better.  We reviewed her weight.  It is 227 pounds today.  She does have generalized bilateral lower extremity edema.  She was recently prescribed doxycycline by her PCP.  There is no signs of infection in her lower extremities today.  She has not been requiring home oxygen unless she does has activities that require increased exertion such as mopping.  With these activities she notes increased work of breathing and will need to rest and use her oxygen.  Then she was able to resume her normal activities without using oxygen.  She reports eating a low-sodium diet and limiting her caffeine.  I will order a BMP today, have her continue her heart healthy low-sodium diet, continue fluid restriction, have her use stockinette on her bilateral lower extremities, and plan follow-up for 3-4 months.  Today she denies chest pain, palpitations, melena, hematuria, hemoptysis, diaphoresis, weakness, presyncope, syncope, orthopnea, and PND.  Past Medical History:  Diagnosis Date   Anemia    Aortic atherosclerosis ([place]) 04/29/2017   Asthma    Atrial fibrillation, persistent ([place])    AVM (arteriovenous malformation) of duodenum, acquired with hemorrhage 11/22/2020   CAD (coronary artery disease) 2016   non-obstructive at cath   Chronic diastolic CHF (congestive heart failure) ([place])    Diabetes mellitus without complication ([place])    type 2   HLD (hyperlipidemia)     Hypertension    Iron deficiency anemia due to chronic blood loss 11/22/2020   S/P TAVR (transcatheter aortic valve replacement)    Severe aortic stenosis 04/26/2017    Past Surgical History:  Procedure Laterality Date   LEFT HEART CATHETERIZATION WITH CORONARY ANGIOGRAM N/A 09/27/2014   Non-obstructive disease, CRF reduction recommended. Procedure: LEFT HEART CATHETERIZATION WITH CORONARY ANGIOGRAM;  Surgeon: Blane Ohara, MD;  Location: [facility] CATH LAB;  Service: Cardiovascular;  Laterality: N/A;   RIGHT/LEFT HEART CATH AND CORONARY ANGIOGRAPHY N/A 10/30/2018   Procedure: RIGHT/LEFT HEART CATH AND CORONARY ANGIOGRAPHY;  Surgeon: Burnell Blanks, MD;  Location: [facility] CV LAB;  Service: Cardiovascular;  Laterality: N/A;   TEE WITHOUT CARDIOVERSION N/A 11/18/2018   Procedure: TRANSESOPHAGEAL ECHOCARDIOGRAM (TEE);  Surgeon: Burnell Blanks, MD;  Location: [facility] CV LAB;  Service: Open Heart Surgery;  Laterality: N/A;   TRANSCATHETER AORTIC VALVE REPLACEMENT, TRANSFEMORAL N/A 11/18/2018   Procedure: TRANSCATHETER AORTIC VALVE REPLACEMENT, TRANSFEMORAL;  Surgeon: Burnell Blanks, MD;  Location: [facility] CV LAB;  Service: Open Heart Surgery;  Laterality: N/A;   TUBAL LIGATION      Current Medications: Current Meds  Medication Sig   allopurinol (ZYLOPRIM) 100 MG tablet Take 1 tablet (100 mg total) by mouth daily.   calcium carbonate (TUMS EX) 750 MG chewable tablet Chew 1 tablet by mouth as needed for heartburn.    doxycycline (VIBRA-TABS) 100 MG tablet Take 1 tablet (100 mg total) by mouth 2 (two) times daily.   ELIQUIS 5 MG TABS tablet TAKE 1 TABLET BY MOUTH TWICE A DAY   ferrous sulfate 325 (65 FE) MG tablet Take 325 mg by mouth daily.   furosemide (LASIX) 20 MG tablet TAKE 1 TABLET (20MG ) BY MOUTH DAILY. OK TO TAKE EXTRA TABLET DAILY AS NEEDED   furosemide (LASIX) 40 MG tablet Take 2 tablets by mouth daily. ! Tab 40mg  am, 1 tab 20mg  pm (60mg  total)    hydrocortisone 2.5 % cream APPLY TOPICALLY TWICE A DAY   insulin NPH Human (NOVOLIN N) 100 UNIT/ML injection Inject 50 Units into the skin in the morning and at bedtime.    levalbuterol (XOPENEX HFA) 45 MCG/ACT inhaler INHALE 1 TO 2 PUFFS BY MOUTH EVERY 4 HOURS AS NEEDED FOR WHEEZE   losartan (COZAAR) 25 MG tablet Take 1 tablet (25 mg total) by mouth daily.   metoprolol tartrate (LOPRESSOR) 25 MG tablet Take 1 tablet (25 mg total) by mouth 2 (two) times daily.   Multiple Vitamin (MULTIVITAMIN ADULT PO) Take 1 tablet by mouth daily.   nitroGLYCERIN (NITROSTAT) 0.4 MG SL tablet Place 1 tablet (0.4 mg total) under the tongue every 5 (five) minutes x 3 doses as needed for chest pain (or severe SOB).   nystatin (MYCOSTATIN/NYSTOP) powder Apply 1 application topically once as needed (for skin iritation).   potassium chloride (KLOR-CON) 10 MEQ tablet Take 1 tablet (10 mEq total) by mouth daily.   traMADol (ULTRAM) 50 MG tablet 1 tab po every 6 hours as needed severe pain  vitamin C (ASCORBIC ACID) 250 MG tablet Take 250 mg by mouth daily.   zinc gluconate 50 MG tablet Take 50 mg by mouth daily.     Allergies:   Codeine, Epinephrine, Penicillins, Prednisone, Statins, Lidocaine, and Sulfa antibiotics   Social History   Socioeconomic History   Marital status: Widowed    Spouse name: Not on file   Number of children: 4   Years of education: Not on file   Highest education level: Not on file  Occupational History   Occupation: Retired-Secretary  Tobacco Use   Smoking status: Never   Smokeless tobacco: Never  Vaping Use   Vaping Use: Never used  Substance and Sexual Activity   Alcohol use: No    Alcohol/week: 0.0 standard drinks   Drug use: No   Sexual activity: Not on file  Other Topics Concern   Not on file  Social History Narrative   Pt lives alone in [place].    Social Determinants of Health   Financial Resource Strain: Not on file  Food Insecurity: Not on file  Transportation  Needs: Not on file  Physical Activity: Not on file  Stress: Not on file  Social Connections: Not on file     Family History: The patient's family history includes Heart attack (age of onset: 34) in her brother; Transient ischemic attack in her mother.  ROS:   Please see the history of present illness.     All other systems reviewed and are negative.   Risk Assessment/Calculations:    CHA2DS2-VASc Score = 6    This indicates a 9.7% annual risk of stroke. The patient's score is based upon: CHF History: 1 HTN History: 1 Diabetes History: 1 Stroke History: 0 Vascular Disease History: 0 Age Score: 2 Gender Score: 1          Physical Exam:    VS:  BP 134/72    Pulse 77    Ht 4\' 9"  (1.448 m)    Wt 227 lb (103 kg)    SpO2 99%    BMI 49.12 kg/m     Wt Readings from Last 3 Encounters:  10/26/21 227 lb (103 kg)  10/11/21 229 lb (103.9 kg)  09/13/21 238 lb 11.2 oz (108.3 kg)     GEN:  Well nourished, well developed in no acute distress HEENT: Normal NECK: No JVD; No carotid bruits LYMPHATICS: No lymphadenopathy CARDIAC: Irregularly irregular, no murmurs, rubs, gallops RESPIRATORY:  Clear to auscultation without rales, wheezing or rhonchi  ABDOMEN: Soft, non-tender, non-distended MUSCULOSKELETAL:  No edema; No deformity  SKIN: Warm and dry NEUROLOGIC:  Alert and oriented x 3 PSYCHIATRIC:  Normal affect    EKGs/Labs/Other Studies Reviewed:    The following studies were reviewed today: Echocardiogram 03/20/2021  IMPRESSIONS     1. Left ventricular ejection fraction, by estimation, is 55 to 60%. The  left ventricle has normal function. The left ventricle has no regional  wall motion abnormalities. The left ventricular internal cavity size was  mildly dilated. Left ventricular  diastolic function could not be evaluated.   2. The right ventricle is poorly seen, but appears to be dilated and at  least moderately depressed. Right ventricular systolic function was not   well visualized. The right ventricular size is not well visualized.   3. Left atrial size was severely dilated.   4. The mitral valve is degenerative. No evidence of mitral valve  regurgitation. Mild mitral stenosis. The mean mitral valve gradient is 5.0  mmHg with  average heart rate of 75 bpm. Moderate to severe mitral annular  calcification.   5. The perivalvular leak is in the 2 o'clock location, in the vicinity of  the left coronary artery ostium. The aortic valve has been  repaired/replaced. Aortic valve regurgitation is mild to moderate. There  is a 23 mm Sapien prosthetic (TAVR) valve  present in the aortic position. Procedure Date: 11/18/2018. Echo findings  are consistent with perivalvular leak of the aortic prosthesis. Aortic  valve mean gradient measures 12.9 mmHg. Aortic valve Vmax measures 2.48  m/s. Aortic valve acceleration time  measures 85 msec.   Comparison(s): Prior images reviewed side by side. The perivalvular leak  is in a similar location, but appears a little worse.   EKG: None today.  Recent Labs: 02/07/2021: TSH 2.144 04/10/2021: Brain Natriuretic Peptide 511 08/30/2021: Hemoglobin 14.3; Platelet Count 181 10/11/2021: ALT 42; BUN 21; Creatinine, Ser 1.09; Potassium 3.7; Pro B Natriuretic peptide (BNP) 466.0; Sodium 140  Recent Lipid Panel    Component Value Date/Time   CHOL 219 (H) 09/26/2014 0030   TRIG 231 (H) 09/26/2014 0030   HDL 43 09/26/2014 0030   CHOLHDL 5.1 09/26/2014 0030   VLDL 46 (H) 09/26/2014 0030   LDLCALC 130 (H) 09/26/2014 0030    ASSESSMENT & PLAN    Diastolic CHF-breathing much better today.  Weight today 227.  She was able to walk from her car to the exam room today.  Previously reported increased work of breathing x2-3 months.  Was seen by PCP who felt that she was fluid volume overloaded and increased furosemide 09/11/21.  Echocardiogram 03/20/2021 showed normal LVEF. Continue furosemide, losartan, metoprolol, potassium Heart healthy  low-sodium diet-salty 6-reviewed Increase physical activity as tolerated Daily weights-contact office with a weight increase of 2 pounds overnight or 5 pounds in a week Continue fluid restriction-64 ounces-reviewed Continue lower extremity compression  Atrial fibrillation-heart rate today 77 BMP.  Denies episodes of accelerated heart rate.  CHA2DS2-VASc score 6.  Reports compliance with apixaban and denies bleeding issues. Continue apixaban, metoprolol Heart healthy low-sodium diet Increase physical activity as tolerated  Aortic valve stenosis-no increased DOE or activity intolerance.  Status post TAVR 2020.  Echocardiogram 7/22 showed mild-moderate aortic valve regurgitation. Follows with structural heart team   Disposition: Follow-up with Dr. Debara Pickett or me in 3-4 month       Medication Adjustments/Labs and Tests Ordered: Current medicines are reviewed at length with the patient today.  Concerns regarding medicines are outlined above.  No orders of the defined types were placed in this encounter.  No orders of the defined types were placed in this encounter.   There are no Patient Instructions on file for this visit.   Signed, Deberah Pelton, NP  10/26/2021 10:44 AM      Notice: This dictation was prepared with Dragon dictation along with smaller phrase technology. Any transcriptional errors that result from this process are unintentional and may not be corrected upon review.  I spent 15 minutes examining this patient, reviewing medications, and using patient centered shared decision making involving her cardiac care.  Prior to her visit I spent greater than 20 minutes reviewing her past medical history,  medications, and prior cardiac tests.

## 2021-10-23 NOTE — Telephone Encounter (Signed)
Requesting: tramadol Contract:n/a UDS:n/a Last Visit:10/11/21 Next Visit:n/a Last Refill:n/a  Please Advise

## 2021-10-24 MED ORDER — TRAMADOL HCL 50 MG PO TABS: ORAL_TABLET | ORAL | 0 refills | Status: DC

## 2021-10-24 NOTE — Addendum Note (Signed)
Addended by: Gwenevere Abbot on: 10/24/2021 08:09 AM   Modules accepted: Orders

## 2021-10-26 ENCOUNTER — Encounter (HOSPITAL_BASED_OUTPATIENT_CLINIC_OR_DEPARTMENT_OTHER): Payer: Self-pay | Admitting: General Practice

## 2021-10-26 ENCOUNTER — Ambulatory Visit (HOSPITAL_BASED_OUTPATIENT_CLINIC_OR_DEPARTMENT_OTHER): Payer: Medicare HMO | Admitting: General Practice

## 2021-10-26 ENCOUNTER — Other Ambulatory Visit: Payer: Self-pay

## 2021-10-26 ENCOUNTER — Other Ambulatory Visit (HOSPITAL_BASED_OUTPATIENT_CLINIC_OR_DEPARTMENT_OTHER): Payer: Self-pay | Admitting: Nurse Practitioner

## 2021-10-26 VITALS — BP 134/72 | HR 77 | Ht <= 58 in | Wt 227.0 lb

## 2021-10-26 DIAGNOSIS — I4819 Other persistent atrial fibrillation: Secondary | ICD-10-CM

## 2021-10-26 DIAGNOSIS — I5032 Chronic diastolic (congestive) heart failure: Secondary | ICD-10-CM | POA: Diagnosis not present

## 2021-10-26 DIAGNOSIS — Z952 Presence of prosthetic heart valve: Secondary | ICD-10-CM

## 2021-10-26 MED ORDER — FUROSEMIDE 80 MG PO TABS: 80.0000 mg | ORAL_TABLET | Freq: Every day | ORAL | 3 refills | Status: DC

## 2021-10-26 MED ORDER — POTASSIUM CHLORIDE CRYS ER 20 MEQ PO TBCR: 20.0000 meq | EXTENDED_RELEASE_TABLET | Freq: Every day | ORAL | 3 refills | Status: DC

## 2021-10-26 NOTE — Patient Instructions (Signed)
Medication Instructions:  Your Physician recommend you continue on your current medication as directed.   We have updated your medication prescriptions to reflect the doses you have been taking!   *If you need a refill on your cardiac medications before your next appointment, please call your pharmacy*   Lab Work: Your physician recommends that you return for lab work Today- BMET   If you have labs (blood work) drawn today and your tests are completely normal, you will receive your results only by: MyChart Message (if you have MyChart) OR A paper copy in the mail If you have any lab test that is abnormal or we need to change your treatment, we will call you to review the results.   Testing/Procedures: None ordered today    Follow-Up: At [facility], you and your health needs are our priority.  As part of our continuing mission to provide you with exceptional heart care, we have created designated Provider Care Teams.  These Care Teams include your primary Cardiologist (physician) and Advanced Practice Providers (APPs -  Physician Assistants and Nurse Practitioners) who all work together to provide you with the care you need, when you need it.  We recommend signing up for the patient portal called "MyChart".  Sign up information is provided on this After Visit Summary.  MyChart is used to connect with patients for Virtual Visits (Telemedicine).  Patients are able to view lab/test results, encounter notes, upcoming appointments, etc.  Non-urgent messages can be sent to your provider as well.   To learn more about what you can do with MyChart, go to [email or web address].    Your next appointment:   3-4 month(s)  The format for your next appointment:   In Person  Provider:   Chrystie Nose, MD { Other Instructions Recommend weighing daily and keeping a log. Please call our office if you have weight gain of 2 pounds overnight or 5 pounds in 1 week.   Date  Time Weight

## 2021-10-27 LAB — BASIC METABOLIC PANEL
BUN/Creatinine Ratio: 25 (ref 12–28)
BUN: 26 mg/dL (ref 8–27)
CO2: 17 mmol/L — ABNORMAL LOW (ref 20–29)
Calcium: 10.3 mg/dL (ref 8.7–10.3)
Chloride: 103 mmol/L (ref 96–106)
Creatinine, Ser: 1.02 mg/dL — ABNORMAL HIGH (ref 0.57–1.00)
Glucose: 117 mg/dL — ABNORMAL HIGH (ref 70–99)
Potassium: 4.4 mmol/L (ref 3.5–5.2)
Sodium: 143 mmol/L (ref 134–144)
eGFR: 56 mL/min/{1.73_m2} — ABNORMAL LOW (ref >59)

## 2021-10-31 ENCOUNTER — Telehealth: Payer: Self-pay | Admitting: *Deleted

## 2021-10-31 NOTE — Telephone Encounter (Signed)
° °  Pre-operative Risk Assessment    Patient Name: Taylor Jenkins  DOB: 21-Oct-1940 MRN: [id number]     Request for Surgical Clearance    Procedure:  ONE EXTRACTION #7 WITH BONE GRAFT,CONNECTING 6 AND 8 BRIDGE WITH PONTIC FOR #7  Date of Surgery:  Clearance TBD                                 Surgeon: Dr Jory Ee Surgeon's Group or Practice Name:  [place] Phone number:  [phone]  Fax number:  [phone]   Type of Clearance Requested:   - Pharmacy:  Hold Apixaban (Eliquis)     Type of Anesthesia:  Not Indicated   Additional requests/questions:  Please advise surgeon/provider what medications should be held.  Olin Pia   10/31/2021, 10:03 AM

## 2021-11-01 ENCOUNTER — Ambulatory Visit: Payer: Medicare HMO | Admitting: Medical

## 2021-11-02 MED ORDER — AZITHROMYCIN 500 MG PO TABS: ORAL_TABLET | ORAL | 0 refills | Status: DC

## 2021-11-02 NOTE — Telephone Encounter (Signed)
? ?  Patient Name: Taylor Jenkins  ?DOB: 12-21-1940 ?MRN: [id number] ? ?Primary Cardiologist: Chrystie Nose, MD ? ?Chart reviewed as part of pre-operative protocol coverage.  ? ?Procedure: One extraction #7 with bone graft, connect #6 and #8 bridge with Pontic for #7 ? ?IF MULTIPLE EXTRACTIONS OR COMPLEX DENTAL PROCEDURES: (follow standard pre-op clearance process). The patient was advised that if she develops new symptoms prior to surgery to contact our office to arrange for a follow-up visit, and she verbalized understanding. ? ?Spoke with patient's daughter, Taylor Jenkins on the phone today. She is doing well from a cardiac standpoint. She has no new CV symptoms. Seen in the clinic 09/13/21 by Edd Fabian, NP. Meets required METs for clearance.  ? ?Okay to hold Eliquis x 1 day before her procedure and to resume 24 hours post procedure.  ? ?SBE prophylaxis is required for the patient from a cardiac standpoint. Rx for azithromycin 500mg  30-60 min prior to dental procedure sent to pharmacy today.  ? ?I will route this recommendation to the requesting party via Epic fax function and remove from pre-op pool. ? ?Please call with questions. ? ? , PA-C ?11/02/2021, 2:22 PM  ?

## 2021-11-02 NOTE — Telephone Encounter (Signed)
Patient with diagnosis of afib on Eliquis for anticoagulation.   ? ?Procedure:  ONE EXTRACTION #7 WITH BONE GRAFT,CONNECTING 6 AND 8 BRIDGE WITH PONTIC FOR #7 ?Date of procedure: TBD ? ? ?CHA2DS2-VASc Score = 7  ? This indicates a 11.2% annual risk of stroke. ?The patient's score is based upon: ?CHF History: 1 ?HTN History: 1 ?Diabetes History: 1 ?Stroke History: 0 ?Vascular Disease History: 1 ?Age Score: 2 ?Gender Score: 1 ?  ?  ? ?CrCl 47 ml/min ? ?Patient does require pre-op antibiotics for dental procedure. Rx for azithromycin 500mg  30-60 min prior to dental procedure sent to pharmacy. ? ?Per office protocol, patient can hold Eliquis for 1 day prior to procedure.   ? ?She should resume Eliquis 24 hours post procedure. ? ?

## 2021-11-13 ENCOUNTER — Institutional Professional Consult (permissible substitution): Payer: Medicare HMO | Admitting: Pulmonary Disease

## 2021-11-13 DIAGNOSIS — R69 Illness, unspecified: Secondary | ICD-10-CM | POA: Diagnosis not present

## 2021-11-15 NOTE — Progress Notes (Addendum)
? ?Subjective:  ? Taylor Jenkins is a 81 y.o. female who presents for an Initial Medicare Annual Wellness Visit. ? ?I connected with  [name] on 11/16/21 by a audio enabled telemedicine application and verified that I am speaking with the correct person using two identifiers. ? ?Patient Location: Home ? ?Provider Location: Home Office ? ?I discussed the limitations of evaluation and management by telemedicine. The patient expressed understanding and agreed to proceed.  ? ?Review of Systems    ? ?Cardiac Risk Factors include: advanced age (>24men, >3 women);diabetes mellitus;dyslipidemia;hypertension ? ?   ?Objective:  ?  ?Today's Vitals  ? 11/16/21 1107  ?PainSc: 5   ? ?There is no height or weight on file to calculate BMI. ? ?Advanced Directives 11/16/2021 08/30/2021 05/30/2021 04/17/2021 03/22/2021 03/01/2021 01/24/2021  ?Does Patient Have a Medical Advance Directive? Yes Yes Yes Yes Yes No Yes  ?Type of Estate agent of [place];Living will;Out of facility DNR (pink MOST or yellow form) Healthcare Power of [place];Living will Living will;Healthcare Power of [employer] Power of [place];Living will Healthcare Power of [place];Living will - Healthcare Power of [place];Living will  ?Does patient want to make changes to medical advance directive? - No - Patient declined No - Patient declined No - Patient declined No - Patient declined - No - Patient declined  ?Copy of Healthcare Power of Attorney in Chart? Yes - validated most recent copy scanned in chart (See row information) No - copy requested No - copy requested No - copy requested No - copy requested - No - copy requested  ? ? ?Current Medications (verified) ?Outpatient Encounter Medications as of 11/16/2021  ?Medication Sig  ? allopurinol (ZYLOPRIM) 100 MG tablet Take 1 tablet (100 mg total) by mouth daily.  ? azithromycin (ZITHROMAX) 500 MG tablet Take one tablet by mouth 30-60 min prior to dental appointment.  ? calcium  carbonate (TUMS EX) 750 MG chewable tablet Chew 1 tablet by mouth as needed for heartburn.   ? doxycycline (VIBRA-TABS) 100 MG tablet Take 1 tablet (100 mg total) by mouth 2 (two) times daily.  ? ELIQUIS 5 MG TABS tablet TAKE 1 TABLET BY MOUTH TWICE A DAY  ? ferrous sulfate 325 (65 FE) MG tablet Take 325 mg by mouth daily.  ? furosemide (LASIX) 80 MG tablet Take 1 tablet (80 mg total) by mouth daily.  ? hydrocortisone 2.5 % cream APPLY TOPICALLY TWICE A DAY  ? insulin NPH Human (NOVOLIN N) 100 UNIT/ML injection Inject 50 Units into the skin in the morning and at bedtime.   ? levalbuterol (XOPENEX HFA) 45 MCG/ACT inhaler INHALE 1 TO 2 PUFFS BY MOUTH EVERY 4 HOURS AS NEEDED FOR WHEEZE  ? losartan (COZAAR) 25 MG tablet Take 1 tablet (25 mg total) by mouth daily.  ? metoprolol tartrate (LOPRESSOR) 25 MG tablet Take 1 tablet (25 mg total) by mouth 2 (two) times daily.  ? Multiple Vitamin (MULTIVITAMIN ADULT PO) Take 1 tablet by mouth daily.  ? nitroGLYCERIN (NITROSTAT) 0.4 MG SL tablet Place 1 tablet (0.4 mg total) under the tongue every 5 (five) minutes x 3 doses as needed for chest pain (or severe SOB).  ? nystatin (MYCOSTATIN/NYSTOP) powder Apply 1 application topically once as needed (for skin iritation).  ? potassium chloride (KLOR-CON M) 20 MEQ tablet Take 1 tablet (20 mEq total) by mouth daily.  ? traMADol (ULTRAM) 50 MG tablet 1 tab po every 6 hours as needed severe pain  ? vitamin C (ASCORBIC ACID)  250 MG tablet Take 250 mg by mouth daily.  ? zinc gluconate 50 MG tablet Take 50 mg by mouth daily.  ? ?No facility-administered encounter medications on file as of 11/16/2021.  ? ? ?Allergies (verified) ?Codeine, Epinephrine, Latex, Penicillins, Prednisone, Statins, Lidocaine, and Sulfa antibiotics  ? ?History: ?Past Medical History:  ?Diagnosis Date  ? Anemia   ? Aortic atherosclerosis ([place]) 04/29/2017  ? Asthma   ? Atrial fibrillation, persistent ([place])   ? AVM (arteriovenous malformation) of duodenum, acquired with  hemorrhage 11/22/2020  ? CAD (coronary artery disease) 2016  ? non-obstructive at cath  ? Chronic diastolic CHF (congestive heart failure) ([place])   ? Diabetes mellitus without complication ([place])   ? type 2  ? HLD (hyperlipidemia)   ? Hypertension   ? Iron deficiency anemia due to chronic blood loss 11/22/2020  ? S/P TAVR (transcatheter aortic valve replacement)   ? Severe aortic stenosis 04/26/2017  ? ?Past Surgical History:  ?Procedure Laterality Date  ? LEFT HEART CATHETERIZATION WITH CORONARY ANGIOGRAM N/A 09/27/2014  ? Non-obstructive disease, CRF reduction recommended. Procedure: LEFT HEART CATHETERIZATION WITH CORONARY ANGIOGRAM;  Surgeon: Blane Ohara, MD;  Location: [facility] C[facility] LAB;  Service: Cardiovascular;  Laterality: N/A;  ? RIGHT/LEFT HEART CATH AND CORONARY ANGIOGRAPHY N/A 10/30/2018  ? Procedure: RIGHT/LEFT HEART CATH AND CORONARY ANGIOGRAPHY;  Surgeon: Burnell Blanks, MD;  Location: [facility] CV LAB;  Service: Cardiovascular;  Laterality: N/A;  ? TEE WITHOUT CARDIOVERSION N/A 11/18/2018  ? Procedure: TRANSESOPHAGEAL ECHOCARDIOGRAM (TEE);  Surgeon: Burnell Blanks, MD;  Location: [facility] CV LAB;  Service: Open Heart Surgery;  Laterality: N/A;  ? TRANSCATHETER AORTIC VALVE REPLACEMENT, TRANSFEMORAL N/A 11/18/2018  ? Procedure: TRANSCATHETER AORTIC VALVE REPLACEMENT, TRANSFEMORAL;  Surgeon: Burnell Blanks, MD;  Location: [facility] CV LAB;  Service: Open Heart Surgery;  Laterality: N/A;  ? TUBAL LIGATION    ? ?Family History  ?Problem Relation Age of Onset  ? Transient ischemic attack Mother   ? Heart attack Brother 69  ? ?Social History  ? ?Socioeconomic History  ? Marital status: Widowed  ?  Spouse name: Not on file  ? Number of children: 4  ? Years of education: Not on file  ? Highest education level: Not on file  ?Occupational History  ? Occupation: Retired-Secretary  ?Tobacco Use  ? Smoking status: Never  ? Smokeless tobacco: Never  ?Vaping Use  ? Vaping Use: Never used   ?Substance and Sexual Activity  ? Alcohol use: No  ?  Alcohol/week: 0.0 standard drinks  ? Drug use: No  ? Sexual activity: Not on file  ?Other Topics Concern  ? Not on file  ?Social History Narrative  ? Pt lives alone in [place].   ? ?Social Determinants of Health  ? ?Financial Resource Strain: Low Risk   ? Difficulty of Paying Living Expenses: Not hard at all  ?Food Insecurity: No Food Insecurity  ? Worried About Charity fundraiser in the Last Year: Never true  ? Ran Out of Food in the Last Year: Never true  ?Transportation Needs: No Transportation Needs  ? Lack of Transportation (Medical): No  ? Lack of Transportation (Non-Medical): No  ?Physical Activity: Not on file  ?Stress: No Stress Concern Present  ? Feeling of Stress : Not at all  ?Social Connections: Moderately Isolated  ? Frequency of Communication with Friends and Family: More than three times a week  ? Frequency of Social Gatherings with Friends and Family: Twice a week  ?  Attends Religious Services: More than 4 times per year  ? Active Member of Clubs or Organizations: No  ? Attends Archivist Meetings: Never  ? Marital Status: Widowed  ? ? ?Tobacco Counseling ?Counseling given: Not Answered ? ? ?Clinical Intake: ? ?Pre-visit preparation completed: Yes ? ?Pain : 0-10 ?Pain Score: 5  ?Pain Location: Other (Comment) ?Pain Orientation: Other (Comment) ?Pain Descriptors / Indicators: Sore ?Pain Onset: Other (comment) ?Pain Frequency: Other (Comment) ? ?  ? ?Nutritional Risks: None ?Diabetes: Yes ?CBG done?: No ?Did pt. bring in CBG monitor from home?: No ? ?  ? ?Diabetic?Yes ?Nutrition Risk Assessment: ? ?Has the patient had any N/V/D within the last 2 months?  No  ?Does the patient have any non-healing wounds?  No  ?Has the patient had any unintentional weight loss or weight gain?  No  ? ?Diabetes: ? ?Is the patient diabetic?  Yes  ?If diabetic, was a CBG obtained today?  No  ?Did the patient bring in their glucometer from home?  No  ?How  often do you monitor your CBG's? 3 times a daily.  ? ?Financial Strains and Diabetes Management: ? ?Are you having any financial strains with the device, your supplies or your medication? No .  ?Does the pati

## 2021-11-16 ENCOUNTER — Ambulatory Visit (INDEPENDENT_AMBULATORY_CARE_PROVIDER_SITE_OTHER): Payer: Medicare HMO

## 2021-11-16 DIAGNOSIS — Z780 Asymptomatic menopausal state: Secondary | ICD-10-CM

## 2021-11-16 DIAGNOSIS — Z1231 Encounter for screening mammogram for malignant neoplasm of breast: Secondary | ICD-10-CM

## 2021-11-16 DIAGNOSIS — Z0000 Encounter for general adult medical examination without abnormal findings: Secondary | ICD-10-CM | POA: Diagnosis not present

## 2021-11-16 NOTE — Patient Instructions (Signed)
Taylor Jenkins , ?Thank you for taking time to come for your Medicare Wellness Visit. I appreciate your ongoing commitment to your health goals. Please review the following plan we discussed and let me know if I can assist you in the future.  ? ?Screening recommendations/referrals: ?Colonoscopy: no longer needed ?Mammogram: ordered 11/16/21 ?Bone Density: ordered 11/16/21 ?Recommended yearly ophthalmology/optometry visit for glaucoma screening and checkup ?Recommended yearly dental visit for hygiene and checkup ? ?Vaccinations: ?Influenza vaccine: up to date ?Pneumococcal vaccine: up to date ?Tdap vaccine: up to date ?Shingles vaccine: declined    ?Covid-19:declined ? ?Advanced directives: yes, on file ? ?Conditions/risks identified: see problem list ? ?Next appointment: Follow up in one year for your annual wellness visit  ? ? ?Preventive Care 24 Years and Older, Female ?Preventive care refers to lifestyle choices and visits with your health care provider that can promote health and wellness. ?What does preventive care include? ?A yearly physical exam. This is also called an annual well check. ?Dental exams once or twice a year. ?Routine eye exams. Ask your health care provider how often you should have your eyes checked. ?Personal lifestyle choices, including: ?Daily care of your teeth and gums. ?Regular physical activity. ?Eating a healthy diet. ?Avoiding tobacco and drug use. ?Limiting alcohol use. ?Practicing safe sex. ?Taking low-dose aspirin every day. ?Taking vitamin and mineral supplements as recommended by your health care provider. ?What happens during an annual well check? ?The services and screenings done by your health care provider during your annual well check will depend on your age, overall health, lifestyle risk factors, and family history of disease. ?Counseling  ?Your health care provider may ask you questions about your: ?Alcohol use. ?Tobacco use. ?Drug use. ?Emotional well-being. ?Home and  relationship well-being. ?Sexual activity. ?Eating habits. ?History of falls. ?Memory and ability to understand (cognition). ?Work and work Astronomer. ?Reproductive health. ?Screening  ?You may have the following tests or measurements: ?Height, weight, and BMI. ?Blood pressure. ?Lipid and cholesterol levels. These may be checked every 5 years, or more frequently if you are over 28 years old. ?Skin check. ?Lung cancer screening. You may have this screening every year starting at age 50 if you have a 30-pack-year history of smoking and currently smoke or have quit within the past 15 years. ?Fecal occult blood test (FOBT) of the stool. You may have this test every year starting at age 54. ?Flexible sigmoidoscopy or colonoscopy. You may have a sigmoidoscopy every 5 years or a colonoscopy every 10 years starting at age 11. ?Hepatitis C blood test. ?Hepatitis B blood test. ?Sexually transmitted disease (STD) testing. ?Diabetes screening. This is done by checking your blood sugar (glucose) after you have not eaten for a while (fasting). You may have this done every 1-3 years. ?Bone density scan. This is done to screen for osteoporosis. You may have this done starting at age 77. ?Mammogram. This may be done every 1-2 years. Talk to your health care provider about how often you should have regular mammograms. ?Talk with your health care provider about your test results, treatment options, and if necessary, the need for more tests. ?Vaccines  ?Your health care provider may recommend certain vaccines, such as: ?Influenza vaccine. This is recommended every year. ?Tetanus, diphtheria, and acellular pertussis (Tdap, Td) vaccine. You may need a Td booster every 10 years. ?Zoster vaccine. You may need this after age 61. ?Pneumococcal 13-valent conjugate (PCV13) vaccine. One dose is recommended after age 2. ?Pneumococcal polysaccharide (PPSV23) vaccine. One dose is recommended  after age 22. ?Talk to your health care provider  about which screenings and vaccines you need and how often you need them. ?This information is not intended to replace advice given to you by your health care provider. Make sure you discuss any questions you have with your health care provider. ?Document Released: 09/16/2015 Document Revised: 05/09/2016 Document Reviewed: 06/21/2015 ?Elsevier Interactive Patient Education ? 2017 [place]. ? ?Fall Prevention in the Home ?Falls can cause injuries. They can happen to people of all ages. There are many things you can do to make your home safe and to help prevent falls. ?What can I do on the outside of my home? ?Regularly fix the edges of walkways and driveways and fix any cracks. ?Remove anything that might make you trip as you walk through a door, such as a raised step or threshold. ?Trim any bushes or trees on the path to your home. ?Use bright outdoor lighting. ?Clear any walking paths of anything that might make someone trip, such as rocks or tools. ?Regularly check to see if handrails are loose or broken. Make sure that both sides of any steps have handrails. ?Any raised decks and porches should have guardrails on the edges. ?Have any leaves, snow, or ice cleared regularly. ?Use sand or salt on walking paths during winter. ?Clean up any spills in your garage right away. This includes oil or grease spills. ?What can I do in the bathroom? ?Use night lights. ?Install grab bars by the toilet and in the tub and shower. Do not use towel bars as grab bars. ?Use non-skid mats or decals in the tub or shower. ?If you need to sit down in the shower, use a plastic, non-slip stool. ?Keep the floor dry. Clean up any water that spills on the floor as soon as it happens. ?Remove soap buildup in the tub or shower regularly. ?Attach bath mats securely with double-sided non-slip rug tape. ?Do not have throw rugs and other things on the floor that can make you trip. ?What can I do in the bedroom? ?Use night lights. ?Make sure  that you have a light by your bed that is easy to reach. ?Do not use any sheets or blankets that are too big for your bed. They should not hang down onto the floor. ?Have a firm chair that has side arms. You can use this for support while you get dressed. ?Do not have throw rugs and other things on the floor that can make you trip. ?What can I do in the kitchen? ?Clean up any spills right away. ?Avoid walking on wet floors. ?Keep items that you use a lot in easy-to-reach places. ?If you need to reach something above you, use a strong step stool that has a grab bar. ?Keep electrical cords out of the way. ?Do not use floor polish or wax that makes floors slippery. If you must use wax, use non-skid floor wax. ?Do not have throw rugs and other things on the floor that can make you trip. ?What can I do with my stairs? ?Do not leave any items on the stairs. ?Make sure that there are handrails on both sides of the stairs and use them. Fix handrails that are broken or loose. Make sure that handrails are as long as the stairways. ?Check any carpeting to make sure that it is firmly attached to the stairs. Fix any carpet that is loose or worn. ?Avoid having throw rugs at the top or bottom of the stairs. If you  do have throw rugs, attach them to the floor with carpet tape. ?Make sure that you have a light switch at the top of the stairs and the bottom of the stairs. If you do not have them, ask someone to add them for you. ?What else can I do to help prevent falls? ?Wear shoes that: ?Do not have high heels. ?Have rubber bottoms. ?Are comfortable and fit you well. ?Are closed at the toe. Do not wear sandals. ?If you use a stepladder: ?Make sure that it is fully opened. Do not climb a closed stepladder. ?Make sure that both sides of the stepladder are locked into place. ?Ask someone to hold it for you, if possible. ?Clearly mark and make sure that you can see: ?Any grab bars or handrails. ?First and last steps. ?Where the edge of  each step is. ?Use tools that help you move around (mobility aids) if they are needed. These include: ?Canes. ?Walkers. ?Scooters. ?Crutches. ?Turn on the lights when you go into a dark area. Replace any light bulb

## 2021-11-21 ENCOUNTER — Other Ambulatory Visit: Payer: Self-pay | Admitting: Internal Medicine

## 2021-11-21 ENCOUNTER — Other Ambulatory Visit: Payer: Self-pay | Admitting: Medical

## 2021-11-28 ENCOUNTER — Other Ambulatory Visit: Payer: Self-pay

## 2021-11-28 ENCOUNTER — Ambulatory Visit (INDEPENDENT_AMBULATORY_CARE_PROVIDER_SITE_OTHER): Payer: Medicare HMO | Admitting: Medical

## 2021-11-28 ENCOUNTER — Inpatient Hospital Stay: Payer: Medicare HMO | Attending: Hematology & Oncology

## 2021-11-28 ENCOUNTER — Ambulatory Visit (HOSPITAL_BASED_OUTPATIENT_CLINIC_OR_DEPARTMENT_OTHER)
Admission: RE | Admit: 2021-11-28 | Discharge: 2021-11-28 | Disposition: A | Discharge status: Home / Self Care | Payer: Medicare HMO | Source: Ambulatory Visit | Attending: Medical | Admitting: Medical

## 2021-11-28 ENCOUNTER — Encounter: Payer: Self-pay | Admitting: Family

## 2021-11-28 ENCOUNTER — Inpatient Hospital Stay: Payer: Medicare HMO | Admitting: Family

## 2021-11-28 VITALS — BP 115/59 | HR 59 | Resp 18 | Ht <= 58 in | Wt 234.4 lb

## 2021-11-28 VITALS — BP 119/67 | HR 61 | Temp 98.0°F | Resp 20 | Ht <= 58 in | Wt 234.1 lb

## 2021-11-28 DIAGNOSIS — K31811 Angiodysplasia of stomach and duodenum with bleeding: Secondary | ICD-10-CM | POA: Diagnosis not present

## 2021-11-28 DIAGNOSIS — Z7901 Long term (current) use of anticoagulants: Secondary | ICD-10-CM | POA: Diagnosis not present

## 2021-11-28 DIAGNOSIS — Z6841 Body Mass Index (BMI) 40.0 and over, adult: Secondary | ICD-10-CM | POA: Diagnosis not present

## 2021-11-28 DIAGNOSIS — R0602 Shortness of breath: Secondary | ICD-10-CM

## 2021-11-28 DIAGNOSIS — D649 Anemia, unspecified: Secondary | ICD-10-CM

## 2021-11-28 DIAGNOSIS — K909 Intestinal malabsorption, unspecified: Secondary | ICD-10-CM | POA: Diagnosis not present

## 2021-11-28 DIAGNOSIS — I509 Heart failure, unspecified: Secondary | ICD-10-CM

## 2021-11-28 DIAGNOSIS — D508 Other iron deficiency anemias: Secondary | ICD-10-CM | POA: Diagnosis not present

## 2021-11-28 DIAGNOSIS — J811 Chronic pulmonary edema: Secondary | ICD-10-CM | POA: Diagnosis not present

## 2021-11-28 DIAGNOSIS — T148XXA Other injury of unspecified body region, initial encounter: Secondary | ICD-10-CM | POA: Diagnosis not present

## 2021-11-28 DIAGNOSIS — K922 Gastrointestinal hemorrhage, unspecified: Secondary | ICD-10-CM | POA: Insufficient documentation

## 2021-11-28 DIAGNOSIS — I10 Essential (primary) hypertension: Secondary | ICD-10-CM | POA: Diagnosis not present

## 2021-11-28 DIAGNOSIS — D500 Iron deficiency anemia secondary to blood loss (chronic): Secondary | ICD-10-CM

## 2021-11-28 DIAGNOSIS — E119 Type 2 diabetes mellitus without complications: Secondary | ICD-10-CM | POA: Diagnosis not present

## 2021-11-28 DIAGNOSIS — I517 Cardiomegaly: Secondary | ICD-10-CM | POA: Diagnosis not present

## 2021-11-28 DIAGNOSIS — T148XXD Other injury of unspecified body region, subsequent encounter: Secondary | ICD-10-CM

## 2021-11-28 LAB — COMPREHENSIVE METABOLIC PANEL
ALT: 36 U/L — ABNORMAL HIGH (ref 0–35)
AST: 46 U/L — ABNORMAL HIGH (ref 0–37)
Albumin: 3.9 g/dL (ref 3.5–5.2)
Alkaline Phosphatase: 120 U/L — ABNORMAL HIGH (ref 39–117)
BUN: 27 mg/dL — ABNORMAL HIGH (ref 6–23)
CO2: 28 mEq/L (ref 19–32)
Calcium: 9.2 mg/dL (ref 8.4–10.5)
Chloride: 103 mEq/L (ref 96–112)
Creatinine, Ser: 0.93 mg/dL (ref 0.40–1.20)
GFR: 58.47 mL/min — ABNORMAL LOW (ref >60.00)
Glucose, Bld: 99 mg/dL (ref 70–99)
Potassium: 4.4 mEq/L (ref 3.5–5.1)
Sodium: 140 mEq/L (ref 135–145)
Total Bilirubin: 0.6 mg/dL (ref 0.2–1.2)
Total Protein: 6.9 g/dL (ref 6.0–8.3)

## 2021-11-28 LAB — CBC WITH DIFFERENTIAL (CANCER CENTER ONLY)
Abs Immature Granulocytes: 0.06 10*3/uL (ref 0.00–0.07)
Basophils Absolute: 0 10*3/uL (ref 0.0–0.1)
Basophils Relative: 0 %
Eosinophils Absolute: 0.4 10*3/uL (ref 0.0–0.5)
Eosinophils Relative: 5 %
HCT: 42.4 % (ref 36.0–46.0)
Hemoglobin: 13.3 g/dL (ref 12.0–15.0)
Immature Granulocytes: 1 %
Lymphocytes Relative: 14 %
Lymphs Abs: 1.4 10*3/uL (ref 0.7–4.0)
MCH: 29.8 pg (ref 26.0–34.0)
MCHC: 31.4 g/dL (ref 30.0–36.0)
MCV: 95.1 fL (ref 80.0–100.0)
Monocytes Absolute: 0.8 10*3/uL (ref 0.1–1.0)
Monocytes Relative: 9 %
Neutro Abs: 7.1 10*3/uL (ref 1.7–7.7)
Neutrophils Relative %: 71 %
Platelet Count: 178 10*3/uL (ref 150–400)
RBC: 4.46 MIL/uL (ref 3.87–5.11)
RDW: 18.1 % — ABNORMAL HIGH (ref 11.5–15.5)
WBC Count: 9.8 10*3/uL (ref 4.0–10.5)
nRBC: 0 % (ref 0.0–0.2)

## 2021-11-28 LAB — SAMPLE TO BLOOD BANK

## 2021-11-28 LAB — IRON AND IRON BINDING CAPACITY (CC-WL,HP ONLY)
Iron: 82 ug/dL (ref 28–170)
Saturation Ratios: 21 % (ref 10.4–31.8)
TIBC: 392 ug/dL (ref 250–450)
UIBC: 310 ug/dL (ref 148–442)

## 2021-11-28 LAB — BRAIN NATRIURETIC PEPTIDE: Pro B Natriuretic peptide (BNP): 286 pg/mL — ABNORMAL HIGH (ref 0.0–100.0)

## 2021-11-28 LAB — RETICULOCYTES
Immature Retic Fract: 22.1 % — ABNORMAL HIGH (ref 2.3–15.9)
RBC.: 4.45 MIL/uL (ref 3.87–5.11)
Retic Count, Absolute: 160.6 10*3/uL (ref 19.0–186.0)
Retic Ct Pct: 3.6 % — ABNORMAL HIGH (ref 0.4–3.1)

## 2021-11-28 LAB — FERRITIN: Ferritin: 357 ng/mL — ABNORMAL HIGH (ref 11–307)

## 2021-11-28 MED ORDER — METOPROLOL TARTRATE 25 MG PO TABS: ORAL_TABLET | ORAL | 3 refills | Status: DC

## 2021-11-28 MED ORDER — TRAMADOL HCL 50 MG PO TABS: ORAL_TABLET | ORAL | 0 refills | Status: DC

## 2021-11-28 NOTE — Patient Instructions (Addendum)
Htn- bp well controlled today. Refill metoprolol and continue losartan. ? ?Chronic lower ext pain in region of scab. Normal flora on prior culture of area. Did not resolve with doxycycline. At this point will arrange home health wound care with [employer] applications weekly. Want to see you in office one week after first appication to evaluate response.  ? ?For chf get cmp, bnp and cxr. ? ? ?Follow up one week from home health [employer] application but sooner if needed. ?

## 2021-11-28 NOTE — Progress Notes (Signed)
?Hematology and Oncology Follow Up Visit ? ?ED RAYSON ?[id number] ?1940-11-15 81 y.o. ?11/28/2021 ? ? ?Principle Diagnosis:  ?Iron deficiency anemia --patient on Eliquis/malabsorption ?  ?Current Therapy:        ?IV iron as indicated ?Transfusional support as needed ?  ?Interim History:  Taylor Jenkins is here today with her daughter for follow-up. She is doing fairly well but states that she has had a little sinus congestion.  ?Lung sounds clear throughout on auscultation. No wheezing or rhonchi noted.  ?No fever, chills, n/v, cough, rash, dizziness, palpitations, abdominal pain or changes in bowel or bladder habits.  ?She has not noted any abnormal blood loss. She still has about 5 nose bleeds a month that she is able to stop within 5 minutes or so.  ?She has mild SOB and chest discomfort with exertion which she states she feels is due to CHF. She is followed regularly by cardiology.  ?She is on supplemental O2 as needed.  ?She states that the swelling in her lower legs is improved with Lasix and that it is mostly just in her calves now.  ?No falls or syncope to report.  ?She states that she has a good appetite and is doing her best to stay properly hydrated throughout the day.  ? ?ECOG Performance Status: 2 - Symptomatic, <50% confined to bed ? ?Medications:  ?Allergies as of 11/28/2021   ? ?   Reactions  ? Codeine Shortness Of Breath, Itching  ? Epinephrine Shortness Of Breath, Palpitations  ? Latex Anaphylaxis  ? Penicillins Anaphylaxis  ? Did it involve swelling of the face/tongue/throat, SOB, or low BP? Yes ?Did it involve sudden or severe rash/hives, skin peeling, or any reaction on the inside of your mouth or nose? No ?Did you need to seek medical attention at a hospital or doctor's office? Yes ?When did it last happen?      40+ years ?If all above answers are "NO", may proceed with cephalosporin use.  ? Prednisone Other (See Comments)  ? psychosis   ? Statins Other (See Comments)  ? Myalgias  ? Lidocaine  Palpitations  ? Sulfa Antibiotics Rash  ? ?  ? ?  ?Medication List  ?  ? ?  ? Accurate as of November 28, 2021 10:19 AM. If you have any questions, ask your nurse or doctor.  ?  ?  ? ?  ? ?allopurinol 100 MG tablet ?Commonly known as: ZYLOPRIM ?Take 1 tablet (100 mg total) by mouth daily. ?  ?azithromycin 500 MG tablet ?Commonly known as: ZITHROMAX ?Take one tablet by mouth 30-60 min prior to dental appointment. ?  ?calcium carbonate 750 MG chewable tablet ?Commonly known as: TUMS EX ?Chew 1 tablet by mouth as needed for heartburn. ?  ?doxycycline 100 MG tablet ?Commonly known as: VIBRA-TABS ?Take 1 tablet (100 mg total) by mouth 2 (two) times daily. ?  ?Eliquis 5 MG Tabs tablet ?Generic drug: apixaban ?TAKE 1 TABLET BY MOUTH TWICE A DAY ?  ?ferrous sulfate 325 (65 FE) MG tablet ?Take 325 mg by mouth daily. ?  ?furosemide 80 MG tablet ?Commonly known as: LASIX ?Take 1 tablet (80 mg total) by mouth daily. ?  ?hydrocortisone 2.5 % cream ?APPLY TO AFFECTED AREA TWICE A DAY ?  ?insulin NPH Human 100 UNIT/ML injection ?Commonly known as: NOVOLIN N ?Inject 50 Units into the skin in the morning and at bedtime. ?  ?levalbuterol 45 MCG/ACT inhaler ?Commonly known as: XOPENEX HFA ?INHALE 1 TO 2 PUFFS BY  MOUTH EVERY 4 HOURS AS NEEDED FOR WHEEZE ?  ?losartan 25 MG tablet ?Commonly known as: COZAAR ?Take 1 tablet (25 mg total) by mouth daily. ?  ?metoprolol tartrate 25 MG tablet ?Commonly known as: LOPRESSOR ?Take 1 tablet (25 mg total) by mouth 2 (two) times daily. ?  ?MULTIVITAMIN ADULT PO ?Take 1 tablet by mouth daily. ?  ?nitroGLYCERIN 0.4 MG SL tablet ?Commonly known as: NITROSTAT ?Place 1 tablet (0.4 mg total) under the tongue every 5 (five) minutes x 3 doses as needed for chest pain (or severe SOB). ?  ?nystatin powder ?Commonly known as: MYCOSTATIN/NYSTOP ?Apply 1 application topically once as needed (for skin iritation). ?  ?potassium chloride SA 20 MEQ tablet ?Commonly known as: KLOR-CON M ?Take 1 tablet (20 mEq total) by  mouth daily. ?  ?traMADol 50 MG tablet ?Commonly known as: ULTRAM ?1 tab po every 6 hours as needed severe pain ?  ?vitamin C 250 MG tablet ?Commonly known as: ASCORBIC ACID ?Take 250 mg by mouth daily. ?  ?zinc gluconate 50 MG tablet ?Take 50 mg by mouth daily. ?  ? ?  ? ? ?Allergies:  ?Allergies  ?Allergen Reactions  ? Codeine Shortness Of Breath and Itching  ? Epinephrine Shortness Of Breath and Palpitations  ? Latex Anaphylaxis  ? Penicillins Anaphylaxis  ?  Did it involve swelling of the face/tongue/throat, SOB, or low BP? Yes ?Did it involve sudden or severe rash/hives, skin peeling, or any reaction on the inside of your mouth or nose? No ?Did you need to seek medical attention at a hospital or doctor's office? Yes ?When did it last happen?      40+ years ?If all above answers are "NO", may proceed with cephalosporin use. ?  ? Prednisone Other (See Comments)  ?  psychosis   ? Statins Other (See Comments)  ?  Myalgias ?  ? Lidocaine Palpitations  ? Sulfa Antibiotics Rash  ? ? ?Past Medical History, Surgical history, Social history, and Family History were reviewed and updated. ? ?Review of Systems: ?All other 10 point review of systems is negative.  ? ?Physical Exam: ? height is 4\' 9"  (1.448 m) and weight is 234 lb 1.3 oz (106.2 kg). Her oral temperature is 98 ?F (36.7 ?C). Her blood pressure is 119/67 and her pulse is 61. Her respiration is 20 and oxygen saturation is 96%.  ? ?Wt Readings from Last 3 Encounters:  ?11/28/21 234 lb 1.3 oz (106.2 kg)  ?10/26/21 227 lb (103 kg)  ?10/11/21 229 lb (103.9 kg)  ? ? ?Ocular: Sclerae unicteric, pupils equal, round and reactive to light ?Ear-nose-throat: Oropharynx clear, dentition fair ?Lymphatic: No cervical or supraclavicular adenopathy ?Lungs no rales or rhonchi, good excursion bilaterally ?Heart regular rate and rhythm, no murmur appreciated ?Abd soft, nontender, positive bowel sounds ?MSK no focal spinal tenderness, no joint edema ?Neuro: non-focal, well-oriented,  appropriate affect ?Breasts: Deferred  ? ?Lab Results  ?Component Value Date  ? WBC 9.8 11/28/2021  ? HGB 13.3 11/28/2021  ? HCT 42.4 11/28/2021  ? MCV 95.1 11/28/2021  ? PLT 178 11/28/2021  ? ?Lab Results  ?Component Value Date  ? FERRITIN 151 08/30/2021  ? IRON 53 08/30/2021  ? TIBC 389 08/30/2021  ? UIBC 336 08/30/2021  ? IRONPCTSAT 14 08/30/2021  ? ?Lab Results  ?Component Value Date  ? RETICCTPCT 3.6 (H) 11/28/2021  ? RBC 4.45 11/28/2021  ? ?No results found for: KPAFRELGTCHN, LAMBDASER, KAPLAMBRATIO ?No results found for: IGGSERUM, IGA, IGMSERUM ?No results  found for: TOTALPROTELP, ALBUMINELP, A1GS, A2GS, BETS, BETA2SER, GAMS, MSPIKE, SPEI ?  Chemistry   ?   ?Component Value Date/Time  ? NA 143 10/26/2021 1113  ? K 4.4 10/26/2021 1113  ? CL 103 10/26/2021 1113  ? CO2 17 (L) 10/26/2021 1113  ? BUN 26 10/26/2021 1113  ? CREATININE 1.02 (H) 10/26/2021 1113  ? CREATININE 0.96 08/30/2021 1012  ? CREATININE 0.81 04/10/2021 1557  ?    ?Component Value Date/Time  ? CALCIUM 10.3 10/26/2021 1113  ? ALKPHOS 141 (H) 10/11/2021 1515  ? AST 42 (H) 10/11/2021 1515  ? AST 31 08/30/2021 1012  ? ALT 42 (H) 10/11/2021 1515  ? ALT 27 08/30/2021 1012  ? BILITOT 0.8 10/11/2021 1515  ? BILITOT 0.7 08/30/2021 1012  ?  ? ? ? ?Impression and Plan:  Taylor Jenkins is a very pleasant 81 yo caucasian female with iron deficiency anemia secondary to intermittent GI blood loss and malabsorption. ?Hgb is stable at 13.3, MCV 95%, platelets 178.  ?Iron studies pending.  ?Follow-up in 6 months.  ? ?Taylor Stanford, NP ?3/28/202310:19 AM ? ?

## 2021-11-28 NOTE — Progress Notes (Signed)
? ?Subjective:  ? ? Patient ID: Taylor Jenkins, female    DOB: 1941-08-22, 81 y.o.   MRN: [id number] ? ?HPI ?Htn- bp controlled. On losartan 25 mg daily and refilled the metoprolol. Pt states only taking 1/2 tab bid of the 25 mg tab. ? ?Pt still has lower ext pain. Pt states when on doxy the area looked better but did not feel better.  ? ?Last culture showed normal skin flora.  ? ?Pt does report some daily dyspnea on exertion. Hx of chf. Sees cardiologist regularly. Not worse than her baseline but she does express concerns. ? ? ? ? ?Review of Systems  ?Constitutional:  Negative for chills, fatigue and fever.  ?Respiratory:  Positive for shortness of breath. Negative for cough, chest tightness and wheezing.   ?Cardiovascular:  Negative for chest pain and palpitations.  ?Gastrointestinal:  Negative for abdominal pain.  ?Genitourinary:  Positive for hematuria. Negative for urgency.  ?Skin:   ?     See hpi.  ? ?. ?Past Medical History:  ?Diagnosis Date  ? Anemia   ? Aortic atherosclerosis ([place]) 04/29/2017  ? Asthma   ? Atrial fibrillation, persistent ([place])   ? AVM (arteriovenous malformation) of duodenum, acquired with hemorrhage 11/22/2020  ? CAD (coronary artery disease) 2016  ? non-obstructive at cath  ? Chronic diastolic CHF (congestive heart failure) ([place])   ? Diabetes mellitus without complication ([place])   ? type 2  ? HLD (hyperlipidemia)   ? Hypertension   ? Iron deficiency anemia due to chronic blood loss 11/22/2020  ? S/P TAVR (transcatheter aortic valve replacement)   ? Severe aortic stenosis 04/26/2017  ? ?  ?Social History  ? ?Socioeconomic History  ? Marital status: Widowed  ?  Spouse name: Not on file  ? Number of children: 4  ? Years of education: Not on file  ? Highest education level: Not on file  ?Occupational History  ? Occupation: Retired-Secretary  ?Tobacco Use  ? Smoking status: Never  ? Smokeless tobacco: Never  ?Vaping Use  ? Vaping Use: Never used  ?Substance and Sexual Activity  ? Alcohol use: No  ?   Alcohol/week: 0.0 standard drinks  ? Drug use: No  ? Sexual activity: Not on file  ?Other Topics Concern  ? Not on file  ?Social History Narrative  ? Pt lives alone in [place].   ? ?Social Determinants of Health  ? ?Financial Resource Strain: Low Risk   ? Difficulty of Paying Living Expenses: Not hard at all  ?Food Insecurity: No Food Insecurity  ? Worried About Charity fundraiser in the Last Year: Never true  ? Ran Out of Food in the Last Year: Never true  ?Transportation Needs: No Transportation Needs  ? Lack of Transportation (Medical): No  ? Lack of Transportation (Non-Medical): No  ?Physical Activity: Not on file  ?Stress: No Stress Concern Present  ? Feeling of Stress : Not at all  ?Social Connections: Moderately Isolated  ? Frequency of Communication with Friends and Family: More than three times a week  ? Frequency of Social Gatherings with Friends and Family: Twice a week  ? Attends Religious Services: More than 4 times per year  ? Active Member of Clubs or Organizations: No  ? Attends Archivist Meetings: Never  ? Marital Status: Widowed  ?Intimate Partner Violence: Not At Risk  ? Fear of Current or Ex-Partner: No  ? Emotionally Abused: No  ? Physically Abused: No  ? Sexually Abused: No  ? ? ?  Past Surgical History:  ?Procedure Laterality Date  ? LEFT HEART CATHETERIZATION WITH CORONARY ANGIOGRAM N/A 09/27/2014  ? Non-obstructive disease, CRF reduction recommended. Procedure: LEFT HEART CATHETERIZATION WITH CORONARY ANGIOGRAM;  Surgeon: Blane Ohara, MD;  [facility] CATH LAB;  Service: Cardiovascular;  Laterality: N/A;  ? RIGHT/LEFT HEART CATH AND CORONARY ANGIOGRAPHY N/A 10/30/2018  ? Procedure: RIGHT/LEFT HEART CATH AND CORONARY ANGIOGRAPHY;  Surgeon: Burnell Blanks, MD;  Location: [place] [facility] CV LAB;  Service: Cardiovascular;  Laterality: N/A;  ? TEE WITHOUT CARDIOVERSION N/A 11/18/2018  ? Procedure: TRANSESOPHAGEAL ECHOCARDIOGRAM (TEE);  Surgeon: Burnell Blanks, MD;   Location: [place] CV LAB;  Service: Open Heart Surgery;  Laterality: N/A;  ? TRANSCATHETER AORTIC VALVE REPLACEMENT, TRANSFEMORAL N/A 11/18/2018  ? Procedure: TRANSCATHETER AORTIC VALVE REPLACEMENT, TRANSFEMORAL;  Surgeon: Burnell Blanks, MD;  Location: [place] CV LAB;  Service: Open Heart Surgery;  Laterality: N/A;  ? TUBAL LIGATION    ? ? ?Family History  ?Problem Relation Age of Onset  ? Transient ischemic attack Mother   ? Heart attack Brother 39  ? ? ?Allergies  ?Allergen Reactions  ? Codeine Shortness Of Breath and Itching  ? Epinephrine Shortness Of Breath and Palpitations  ? Latex Anaphylaxis  ? Penicillins Anaphylaxis  ?  Did it involve swelling of the face/tongue/throat, SOB, or low BP? Yes ?Did it involve sudden or severe rash/hives, skin peeling, or any reaction on the inside of your mouth or nose? No ?Did you need to seek medical attention at a hospital or doctor's office? Yes ?When did it last happen?      40+ years ?If all above answers are "NO", may proceed with cephalosporin use. ?  ? Prednisone Other (See Comments)  ?  psychosis   ? Statins Other (See Comments)  ?  Myalgias ?  ? Lidocaine Palpitations  ? Sulfa Antibiotics Rash  ? ? ?Current Outpatient Medications on File Prior to Visit  ?Medication Sig Dispense Refill  ? allopurinol (ZYLOPRIM) 100 MG tablet Take 1 tablet (100 mg total) by mouth daily. 90 tablet 1  ? azithromycin (ZITHROMAX) 500 MG tablet Take one tablet by mouth 30-60 min prior to dental appointment. 1 tablet 0  ? calcium carbonate (TUMS EX) 750 MG chewable tablet Chew 1 tablet by mouth as needed for heartburn.     ? doxycycline (VIBRA-TABS) 100 MG tablet Take 1 tablet (100 mg total) by mouth 2 (two) times daily. 20 tablet 0  ? ELIQUIS 5 MG TABS tablet TAKE 1 TABLET BY MOUTH TWICE A DAY 180 tablet 1  ? ferrous sulfate 325 (65 FE) MG tablet Take 325 mg by mouth daily.    ? furosemide (LASIX) 80 MG tablet Take 1 tablet (80 mg total) by mouth daily. 90 tablet 3  ?  hydrocortisone 2.5 % cream APPLY TO AFFECTED AREA TWICE A DAY 28 g 0  ? insulin NPH Human (NOVOLIN N) 100 UNIT/ML injection Inject 50 Units into the skin in the morning and at bedtime.     ? levalbuterol (XOPENEX HFA) 45 MCG/ACT inhaler INHALE 1 TO 2 PUFFS BY MOUTH EVERY 4 HOURS AS NEEDED FOR WHEEZE    ? losartan (COZAAR) 25 MG tablet Take 1 tablet (25 mg total) by mouth daily.    ? Multiple Vitamin (MULTIVITAMIN ADULT PO) Take 1 tablet by mouth daily.    ? nitroGLYCERIN (NITROSTAT) 0.4 MG SL tablet Place 1 tablet (0.4 mg total) under the tongue every 5 (five) minutes x 3 doses as  needed for chest pain (or severe SOB). 25 tablet 2  ? nystatin (MYCOSTATIN/NYSTOP) powder Apply 1 application topically once as needed (for skin iritation).    ? potassium chloride (KLOR-CON M) 20 MEQ tablet Take 1 tablet (20 mEq total) by mouth daily. 90 tablet 3  ? potassium chloride (MICRO-K) 10 MEQ CR capsule Take 10 mEq by mouth daily.    ? vitamin C (ASCORBIC ACID) 250 MG tablet Take 250 mg by mouth daily.    ? zinc gluconate 50 MG tablet Take 50 mg by mouth daily.    ? ?No current facility-administered medications on file prior to visit.  ? ? ?BP (!) 115/59   Pulse (!) 59   Resp 18   Ht 4\' 9"  (1.448 m)   Wt 234 lb 6.4 oz (106.3 kg)   SpO2 98%   BMI 50.72 kg/m?  ?  ? ? ?   ?Objective:  ? Physical Exam ? ?General- No acute distress. Pleasant patient. ?Neck- Full range of motion, no jvd ?Lungs- Clear, even and unlabored. ?Heart- regular rate and rhythm. ?Neurologic- CNII- XII grossly intact.  ?Skin- both lower ext/calfs had 5 scatterred moderate scabs. And in front 3-5 scabs. ? ? ? ? ?   ?Assessment & Plan:  ? ?Patient Instructions  ?Htn- bp well controlled today. Refill metoprolol and continue losartan. ? ?Chronic lower ext pain in region of scab. Normal flora on prior culture of area. Did not resolve with doxycycline. At this point will arrange home health wound care with [facility] applications weekly. Want to see you in office  one week after first appication to evaluate response.  ? ? ?Follow up one week from home health [facility] application but sooner if needed.  ? ?Mackie Pai, PA-C  ?

## 2021-11-29 NOTE — Addendum Note (Signed)
Addended by: Gwenevere Abbot on: 11/29/2021 03:36 PM ? ? Modules accepted: Orders ? ?

## 2021-11-30 ENCOUNTER — Telehealth: Payer: Self-pay | Admitting: Family

## 2021-11-30 ENCOUNTER — Telehealth (HOSPITAL_BASED_OUTPATIENT_CLINIC_OR_DEPARTMENT_OTHER): Payer: Self-pay

## 2021-11-30 NOTE — Telephone Encounter (Signed)
Attempted to contact patient to schedule per [facility] 3/28. No answer so voicemail was left for [patient to call back to [facility] number ?

## 2021-12-01 ENCOUNTER — Telehealth (HOSPITAL_BASED_OUTPATIENT_CLINIC_OR_DEPARTMENT_OTHER): Payer: Self-pay

## 2021-12-04 ENCOUNTER — Telehealth: Payer: Self-pay | Admitting: *Deleted

## 2021-12-04 NOTE — Telephone Encounter (Signed)
Per 11/28/21 los - called and gave upcoming appointments - requested call back to confirm ?

## 2021-12-11 ENCOUNTER — Ambulatory Visit: Payer: Medicare HMO | Admitting: Cardiology

## 2021-12-19 ENCOUNTER — Telehealth: Payer: Self-pay | Admitting: Medical

## 2021-12-19 NOTE — Telephone Encounter (Signed)
[facility] RN called for the patient stating she needs an appointment for Taylor Jenkins to look at her legs. Called pt and LVM for them to call back to schedule appointment.   ?

## 2021-12-23 ENCOUNTER — Other Ambulatory Visit: Payer: Self-pay | Admitting: Nurse Practitioner

## 2021-12-23 DIAGNOSIS — I4819 Other persistent atrial fibrillation: Secondary | ICD-10-CM

## 2021-12-25 NOTE — Telephone Encounter (Signed)
Eliquis 5mg  refill request received. Patient is 81 years old, weight-106.3kg, Crea-0.93 on 11/28/2021, Diagnosis-Afib, and last seen by 11/30/2021 on 10/26/2021. Dose is appropriate based on dosing criteria. Will send in refill to requested pharmacy.   ?

## 2021-12-26 NOTE — Progress Notes (Deleted)
Synopsis: Referred for hypoxemia, abnormal CXR, dyspnea by Esperanza Richters, PA-C  Subjective:   PATIENT ID: Taylor Jenkins GENDER: female DOB: Mar 08, 1941, MRN: [id number]  No chief complaint on file.  80yF with history of anemia, asthma, AF on eliquis, AVMs duodenum, nCAD, chronic diastolic heart failure, DM2, AS s/p TAVR 11/2018  Otherwise pertinent review of systems is negative.  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   CAD (coronary artery disease) 2016   non-obstructive at cath   Chronic diastolic CHF (congestive heart failure) (HCC)    Diabetes mellitus without complication (HCC)    type 2   HLD (hyperlipidemia)    Hypertension    Iron deficiency anemia due to chronic blood loss 11/22/2020   S/P TAVR (transcatheter aortic valve replacement)    Severe aortic stenosis 04/26/2017     Family History  Problem Relation Age of Onset   Transient ischemic attack Mother    Heart attack Brother 86     Past Surgical History:  Procedure Laterality Date   LEFT HEART CATHETERIZATION WITH CORONARY ANGIOGRAM N/A 09/27/2014   Non-obstructive disease, CRF reduction recommended. Procedure: LEFT HEART CATHETERIZATION WITH CORONARY ANGIOGRAM;  Surgeon: Micheline Chapman, MD;  Location: [facility] CATH LAB;  Service: Cardiovascular;  Laterality: N/A;   RIGHT/LEFT HEART CATH AND CORONARY ANGIOGRAPHY N/A 10/30/2018   Procedure: RIGHT/LEFT HEART CATH AND CORONARY ANGIOGRAPHY;  Surgeon: Kathleene Hazel, MD;  Location: [facility] INVASIVE CV LAB;  Service: Cardiovascular;  Laterality: N/A;   TEE WITHOUT CARDIOVERSION N/A 11/18/2018   Procedure: TRANSESOPHAGEAL ECHOCARDIOGRAM (TEE);  Surgeon: Kathleene Hazel, MD;  Location: [facility] INVASIVE CV LAB;  Service: Open Heart Surgery;  Laterality: N/A;   TRANSCATHETER AORTIC VALVE  REPLACEMENT, TRANSFEMORAL N/A 11/18/2018   Procedure: TRANSCATHETER AORTIC VALVE REPLACEMENT, TRANSFEMORAL;  Surgeon: Kathleene Hazel, MD;  Location: [facility] INVASIVE CV LAB;  Service: Open Heart Surgery;  Laterality: N/A;   TUBAL LIGATION      Social History   Socioeconomic History   Marital status: Widowed    Spouse name: Not on file   Number of children: 4   Years of education: Not on file   Highest education level: Not on file  Occupational History   Occupation: Retired-Secretary  Tobacco Use   Smoking status: Never   Smokeless tobacco: Never  Vaping Use   Vaping Use: Never used  Substance and Sexual Activity   Alcohol use: No    Alcohol/week: 0.0 standard drinks   Drug use: No   Sexual activity: Not on file  Other Topics Concern   Not on file  Social History Narrative   Pt lives alone in [place].    Social Determinants of Health   Financial Resource Strain: Low Risk    Difficulty of Paying Living Expenses: Not hard at all  Food Insecurity: No Food Insecurity   Worried About Programme researcher, broadcasting/film/video in the Last Year: Never true   Ran Out of Food in the Last Year: Never true  Transportation Needs: No Transportation Needs   Lack of Transportation (Medical): No   Lack of Transportation (Non-Medical): No  Physical Activity: Not on file  Stress: No Stress Concern Present   Feeling of Stress : Not at all  Social Connections: Moderately Isolated   Frequency of Communication with Friends and Family: More than three  times a week   Frequency of Social Gatherings with Friends and Family: Twice a week   Attends Religious Services: More than 4 times per year   Active Member of [employer] or Organizations: No   Attends Banker Meetings: Never   Marital Status: Widowed  Catering manager Violence: Not At Risk   Fear of Current or Ex-Partner: No   Emotionally Abused: No   Physically Abused: No   Sexually Abused: No     Allergies  Allergen Reactions   Codeine  Shortness Of Breath and Itching   Epinephrine Shortness Of Breath and Palpitations   Latex Anaphylaxis   Penicillins Anaphylaxis    Did it involve swelling of the face/tongue/throat, SOB, or low BP? Yes Did it involve sudden or severe rash/hives, skin peeling, or any reaction on the inside of your mouth or nose? No Did you need to seek medical attention at a hospital or doctor's office? Yes When did it last happen?      40+ years If all above answers are "NO", may proceed with cephalosporin use.    Prednisone Other (See Comments)    psychosis    Statins Other (See Comments)    Myalgias    Lidocaine Palpitations   Sulfa Antibiotics Rash     Outpatient Medications Prior to Visit  Medication Sig Dispense Refill   allopurinol (ZYLOPRIM) 100 MG tablet Take 1 tablet (100 mg total) by mouth daily. 90 tablet 1   azithromycin (ZITHROMAX) 500 MG tablet Take one tablet by mouth 30-60 min prior to dental appointment. 1 tablet 0   calcium carbonate (TUMS EX) 750 MG chewable tablet Chew 1 tablet by mouth as needed for heartburn.      doxycycline (VIBRA-TABS) 100 MG tablet Take 1 tablet (100 mg total) by mouth 2 (two) times daily. 20 tablet 0   ELIQUIS 5 MG TABS tablet TAKE 1 TABLET BY MOUTH TWICE A DAY 60 tablet 5   ferrous sulfate 325 (65 FE) MG tablet Take 325 mg by mouth daily.     furosemide (LASIX) 80 MG tablet Take 1 tablet (80 mg total) by mouth daily. 90 tablet 3   hydrocortisone 2.5 % cream APPLY TO AFFECTED AREA TWICE A DAY 28 g 0   insulin NPH Human (NOVOLIN N) 100 UNIT/ML injection Inject 50 Units into the skin in the morning and at bedtime.      levalbuterol (XOPENEX HFA) 45 MCG/ACT inhaler INHALE 1 TO 2 PUFFS BY MOUTH EVERY 4 HOURS AS NEEDED FOR WHEEZE     losartan (COZAAR) 25 MG tablet Take 1 tablet (25 mg total) by mouth daily.     metoprolol tartrate (LOPRESSOR) 25 MG tablet 1/2 tab po bid 60 tablet 3   Multiple Vitamin (MULTIVITAMIN ADULT PO) Take 1 tablet by mouth daily.      nitroGLYCERIN (NITROSTAT) 0.4 MG SL tablet Place 1 tablet (0.4 mg total) under the tongue every 5 (five) minutes x 3 doses as needed for chest pain (or severe SOB). 25 tablet 2   nystatin (MYCOSTATIN/NYSTOP) powder Apply 1 application topically once as needed (for skin iritation).     potassium chloride (KLOR-CON M) 20 MEQ tablet Take 1 tablet (20 mEq total) by mouth daily. 90 tablet 3   potassium chloride (MICRO-K) 10 MEQ CR capsule Take 10 mEq by mouth daily.     traMADol (ULTRAM) 50 MG tablet 1 tab po every 6 hours as needed severe pain 10 tablet 0   vitamin C (ASCORBIC ACID) 250  MG tablet Take 250 mg by mouth daily.     zinc gluconate 50 MG tablet Take 50 mg by mouth daily.     No facility-administered medications prior to visit.       Objective:   Physical Exam:  General appearance: 81 y.o., female, NAD, conversant  Eyes: anicteric sclerae; PERRL, tracking appropriately HENT: NCAT; MMM Neck: Trachea midline; no lymphadenopathy, no JVD Lungs: CTAB, no crackles, no wheeze, with normal respiratory effort CV: RRR, no murmur  Abdomen: Soft, non-tender; non-distended, BS present  Extremities: No peripheral edema, warm Skin: Normal turgor and texture; no rash Psych: Appropriate affect Neuro: Alert and oriented to person and place, no focal deficit     There were no vitals filed for this visit.   on *** LPM *** RA BMI Readings from Last 3 Encounters:  11/28/21 50.72 kg/m  11/28/21 50.65 kg/m  10/26/21 49.12 kg/m   Wt Readings from Last 3 Encounters:  11/28/21 234 lb 6.4 oz (106.3 kg)  11/28/21 234 lb 1.3 oz (106.2 kg)  10/26/21 227 lb (103 kg)     CBC    Component Value Date/Time   WBC 9.8 11/28/2021 0936   WBC 9.0 04/10/2021 1557   RBC 4.45 11/28/2021 0937   RBC 4.46 11/28/2021 0936   HGB 13.3 11/28/2021 0936   HGB 13.1 11/18/2019 1552   HCT 42.4 11/28/2021 0936   HCT 41.3 11/18/2019 1552   PLT 178 11/28/2021 0936   PLT 205 11/18/2019 1552   MCV 95.1  11/28/2021 0936   MCV 90 11/18/2019 1552   MCH 29.8 11/28/2021 0936   MCHC 31.4 11/28/2021 0936   RDW 18.1 (H) 11/28/2021 0936   RDW 15.7 (H) 11/18/2019 1552   LYMPHSABS 1.4 11/28/2021 0936   LYMPHSABS 1.5 11/18/2019 1552   MONOABS 0.8 11/28/2021 0936   EOSABS 0.4 11/28/2021 0936   EOSABS 0.4 11/18/2019 1552   BASOSABS 0.0 11/28/2021 0936   BASOSABS 0.0 11/18/2019 1552    ***  Chest Imaging: CXR 11/28/21 reviewed by me with cardiomegaly and prominent interstitial markings a little more pronounced than on prior CXRs.   CTA Chest 2020 reviewed by me with dependent reticulatino that probably reflects atelectasis  Pulmonary Functions Testing Results:    Latest Ref Rng & Units 11/05/2018    7:44 AM  PFT Results  FVC-Pre L 1.66    FVC-Predicted Pre % 84    Pre FEV1/FVC % % 78    FEV1-Pre L 1.29    FEV1-Predicted Pre % 89    DLCO uncorrected ml/min/mmHg 9.36    DLCO UNC% % 63    DLCO corrected ml/min/mmHg 9.57    DLCO COR %Predicted % 64    DLVA Predicted % 85    TLC L 4.09    TLC % Predicted % 102    RV % Predicted % 112      FeNO: ***  Pathology: ***  Echocardiogram: ***  Heart Catheterization: ***    Assessment & Plan:    Plan:      Omar Person, MD [facility] 12/26/2021 4:57 PM

## 2021-12-27 ENCOUNTER — Inpatient Hospital Stay: Payer: Medicare HMO | Admitting: Family

## 2021-12-27 ENCOUNTER — Encounter: Payer: Self-pay | Admitting: Family

## 2021-12-27 ENCOUNTER — Other Ambulatory Visit: Payer: Self-pay | Admitting: Family

## 2021-12-27 ENCOUNTER — Institutional Professional Consult (permissible substitution): Payer: Medicare HMO | Admitting: Student

## 2021-12-27 ENCOUNTER — Inpatient Hospital Stay: Payer: Medicare HMO

## 2021-12-27 ENCOUNTER — Other Ambulatory Visit: Payer: Self-pay | Admitting: *Deleted

## 2021-12-27 ENCOUNTER — Telehealth: Payer: Self-pay | Admitting: Family

## 2021-12-27 ENCOUNTER — Inpatient Hospital Stay: Payer: Medicare HMO | Attending: Hematology & Oncology

## 2021-12-27 VITALS — BP 101/44 | HR 63 | Resp 19

## 2021-12-27 VITALS — BP 116/51 | HR 60 | Temp 98.4°F | Resp 20

## 2021-12-27 DIAGNOSIS — K31811 Angiodysplasia of stomach and duodenum with bleeding: Secondary | ICD-10-CM

## 2021-12-27 DIAGNOSIS — D500 Iron deficiency anemia secondary to blood loss (chronic): Secondary | ICD-10-CM

## 2021-12-27 DIAGNOSIS — K909 Intestinal malabsorption, unspecified: Secondary | ICD-10-CM | POA: Insufficient documentation

## 2021-12-27 DIAGNOSIS — D649 Anemia, unspecified: Secondary | ICD-10-CM

## 2021-12-27 DIAGNOSIS — I4891 Unspecified atrial fibrillation: Secondary | ICD-10-CM | POA: Insufficient documentation

## 2021-12-27 DIAGNOSIS — D508 Other iron deficiency anemias: Secondary | ICD-10-CM | POA: Diagnosis not present

## 2021-12-27 DIAGNOSIS — K922 Gastrointestinal hemorrhage, unspecified: Secondary | ICD-10-CM | POA: Diagnosis not present

## 2021-12-27 DIAGNOSIS — Z7901 Long term (current) use of anticoagulants: Secondary | ICD-10-CM | POA: Diagnosis not present

## 2021-12-27 LAB — CBC WITH DIFFERENTIAL (CANCER CENTER ONLY)
Abs Immature Granulocytes: 0.27 10*3/uL — ABNORMAL HIGH (ref 0.00–0.07)
Basophils Absolute: 0.1 10*3/uL (ref 0.0–0.1)
Basophils Relative: 1 %
Eosinophils Absolute: 0.3 10*3/uL (ref 0.0–0.5)
Eosinophils Relative: 3 %
HCT: 25.9 % — ABNORMAL LOW (ref 36.0–46.0)
Hemoglobin: 7.6 g/dL — ABNORMAL LOW (ref 12.0–15.0)
Immature Granulocytes: 3 %
Lymphocytes Relative: 10 %
Lymphs Abs: 1.1 10*3/uL (ref 0.7–4.0)
MCH: 29.8 pg (ref 26.0–34.0)
MCHC: 29.3 g/dL — ABNORMAL LOW (ref 30.0–36.0)
MCV: 101.6 fL — ABNORMAL HIGH (ref 80.0–100.0)
Monocytes Absolute: 0.9 10*3/uL (ref 0.1–1.0)
Monocytes Relative: 8 %
Neutro Abs: 8.3 10*3/uL — ABNORMAL HIGH (ref 1.7–7.7)
Neutrophils Relative %: 75 %
Platelet Count: 160 10*3/uL (ref 150–400)
RBC: 2.55 MIL/uL — ABNORMAL LOW (ref 3.87–5.11)
RDW: 19.3 % — ABNORMAL HIGH (ref 11.5–15.5)
WBC Count: 10.9 10*3/uL — ABNORMAL HIGH (ref 4.0–10.5)
nRBC: 0.3 % — ABNORMAL HIGH (ref 0.0–0.2)

## 2021-12-27 LAB — PREPARE RBC (CROSSMATCH)

## 2021-12-27 LAB — IRON AND IRON BINDING CAPACITY (CC-WL,HP ONLY)
Iron: 54 ug/dL (ref 28–170)
Saturation Ratios: 12 % (ref 10.4–31.8)
TIBC: 445 ug/dL (ref 250–450)
UIBC: 391 ug/dL (ref 148–442)

## 2021-12-27 LAB — SAMPLE TO BLOOD BANK

## 2021-12-27 LAB — FERRITIN: Ferritin: 42 ng/mL (ref 11–307)

## 2021-12-27 LAB — RETICULOCYTES
Immature Retic Fract: 37.6 % — ABNORMAL HIGH (ref 2.3–15.9)
RBC.: 2.53 MIL/uL — ABNORMAL LOW (ref 3.87–5.11)
Retic Count, Absolute: 225.4 10*3/uL — ABNORMAL HIGH (ref 19.0–186.0)
Retic Ct Pct: 8.9 % — ABNORMAL HIGH (ref 0.4–3.1)

## 2021-12-27 MED ORDER — SODIUM CHLORIDE 0.9 % IV SOLN: Freq: Once | INTRAVENOUS | Status: AC

## 2021-12-27 MED ORDER — SODIUM CHLORIDE 0.9 % IV SOLN
300.0000 mg | Freq: Once | INTRAVENOUS | Status: AC
  Administered 2021-12-27: 300 mg via INTRAVENOUS
  Filled 2021-12-27: qty 300

## 2021-12-27 NOTE — Progress Notes (Signed)
?Hematology and Oncology Follow Up Visit ? ?Taylor Jenkins ?[id number] ?Feb 17, 1941 81 y.o. ?12/27/2021 ? ? ?Principle Diagnosis:  ?Iron deficiency anemia --patient on Eliquis/malabsorption ?  ?Current Therapy:        ?IV iron as indicated ?Transfusional support as needed ?  ?Interim History:  Ms. Writer is here today with her daughter for follow-up. She is symptomatic with fatigue, weakness, SOB, chest discomfort, palpitations, brain fog and dizziness. She is quite pale today.  ?Hgb has dropped from 13.3 to 7.6 in 4 weeks.  ?She states that she had to have dental work done recently and took "a lot of Aleve for the pain". She had several nose bleeds as well.  ?She is back on Eliquis for atrial fib.  ?She has not noted any obvious blood loss in her stool. No abnormal bruising, no petechiae.  ?No fever, chills, n/v, cough, rash, abdominal pain or changes in bowel or bladder habits.  ?She has chronic swelling in her lower extremities unchanged from baseline.  ?No numbness or tingling in her extremities.  ?No falls or syncope to report.  ?Her appetite comes and goes. She is doing her best to stay well hydrated. Unable to stand for weight today.  ? ?ECOG Performance Status: 1 - Symptomatic but completely ambulatory ? ?Medications:  ?Allergies as of 12/27/2021   ? ?   Reactions  ? Codeine Shortness Of Breath, Itching  ? Epinephrine Shortness Of Breath, Palpitations  ? Latex Anaphylaxis  ? Penicillins Anaphylaxis  ? Did it involve swelling of the face/tongue/throat, SOB, or low BP? Yes ?Did it involve sudden or severe rash/hives, skin peeling, or any reaction on the inside of your mouth or nose? No ?Did you need to seek medical attention at a hospital or doctor's office? Yes ?When did it last happen?      40+ years ?If all above answers are "NO", may proceed with cephalosporin use.  ? Prednisone Other (See Comments)  ? psychosis   ? Statins Other (See Comments)  ? Myalgias  ? Lidocaine Palpitations  ? Sulfa Antibiotics Rash   ? ?  ? ?  ?Medication List  ?  ? ?  ? Accurate as of December 27, 2021 11:08 AM. If you have any questions, ask your nurse or doctor.  ?  ?  ? ?  ? ?allopurinol 100 MG tablet ?Commonly known as: ZYLOPRIM ?Take 1 tablet (100 mg total) by mouth daily. ?  ?azithromycin 500 MG tablet ?Commonly known as: ZITHROMAX ?Take one tablet by mouth 30-60 min prior to dental appointment. ?  ?calcium carbonate 750 MG chewable tablet ?Commonly known as: TUMS EX ?Chew 1 tablet by mouth as needed for heartburn. ?  ?doxycycline 100 MG tablet ?Commonly known as: VIBRA-TABS ?Take 1 tablet (100 mg total) by mouth 2 (two) times daily. ?  ?Eliquis 5 MG Tabs tablet ?Generic drug: apixaban ?TAKE 1 TABLET BY MOUTH TWICE A DAY ?  ?ferrous sulfate 325 (65 FE) MG tablet ?Take 325 mg by mouth daily. ?  ?furosemide 80 MG tablet ?Commonly known as: LASIX ?Take 1 tablet (80 mg total) by mouth daily. ?  ?hydrocortisone 2.5 % cream ?APPLY TO AFFECTED AREA TWICE A DAY ?  ?insulin NPH Human 100 UNIT/ML injection ?Commonly known as: NOVOLIN N ?Inject 50 Units into the skin in the morning and at bedtime. ?  ?levalbuterol 45 MCG/ACT inhaler ?Commonly known as: XOPENEX HFA ?INHALE 1 TO 2 PUFFS BY MOUTH EVERY 4 HOURS AS NEEDED FOR WHEEZE ?  ?losartan 25  MG tablet ?Commonly known as: COZAAR ?Take 1 tablet (25 mg total) by mouth daily. ?  ?metoprolol tartrate 25 MG tablet ?Commonly known as: LOPRESSOR ?1/2 tab po bid ?  ?MULTIVITAMIN ADULT PO ?Take 1 tablet by mouth daily. ?  ?nitroGLYCERIN 0.4 MG SL tablet ?Commonly known as: NITROSTAT ?Place 1 tablet (0.4 mg total) under the tongue every 5 (five) minutes x 3 doses as needed for chest pain (or severe SOB). ?  ?nystatin powder ?Commonly known as: MYCOSTATIN/NYSTOP ?Apply 1 application topically once as needed (for skin iritation). ?  ?potassium chloride 10 MEQ CR capsule ?Commonly known as: MICRO-K ?Take 10 mEq by mouth daily. ?  ?potassium chloride SA 20 MEQ tablet ?Commonly known as: KLOR-CON M ?Take 1 tablet  (20 mEq total) by mouth daily. ?  ?traMADol 50 MG tablet ?Commonly known as: ULTRAM ?1 tab po every 6 hours as needed severe pain ?  ?vitamin C 250 MG tablet ?Commonly known as: ASCORBIC ACID ?Take 250 mg by mouth daily. ?  ?zinc gluconate 50 MG tablet ?Take 50 mg by mouth daily. ?  ? ?  ? ? ?Allergies:  ?Allergies  ?Allergen Reactions  ? Codeine Shortness Of Breath and Itching  ? Epinephrine Shortness Of Breath and Palpitations  ? Latex Anaphylaxis  ? Penicillins Anaphylaxis  ?  Did it involve swelling of the face/tongue/throat, SOB, or low BP? Yes ?Did it involve sudden or severe rash/hives, skin peeling, or any reaction on the inside of your mouth or nose? No ?Did you need to seek medical attention at a hospital or doctor's office? Yes ?When did it last happen?      40+ years ?If all above answers are "NO", may proceed with cephalosporin use. ?  ? Prednisone Other (See Comments)  ?  psychosis   ? Statins Other (See Comments)  ?  Myalgias ?  ? Lidocaine Palpitations  ? Sulfa Antibiotics Rash  ? ? ?Past Medical History, Surgical history, Social history, and Family History were reviewed and updated. ? ?Review of Systems: ?All other 10 point review of systems is negative.  ? ?Physical Exam: ? oral temperature is 98.4 ?F (36.9 ?C). Her blood pressure is 116/51 (abnormal) and her pulse is 60. Her respiration is 20 and oxygen saturation is 100%.  ? ?Wt Readings from Last 3 Encounters:  ?11/28/21 234 lb 6.4 oz (106.3 kg)  ?11/28/21 234 lb 1.3 oz (106.2 kg)  ?10/26/21 227 lb (103 kg)  ? ? ?Ocular: Sclerae unicteric, pupils equal, round and reactive to light ?Ear-nose-throat: Oropharynx clear, dentition fair ?Lymphatic: No cervical or supraclavicular adenopathy ?Lungs no rales or rhonchi, good excursion bilaterally ?Heart regular rate and rhythm, no murmur appreciated ?Abd soft, nontender, positive bowel sounds ?MSK no focal spinal tenderness, no joint edema ?Neuro: non-focal, well-oriented, appropriate affect ?Breasts:  Deferred  ? ?Lab Results  ?Component Value Date  ? WBC 10.9 (H) 12/27/2021  ? HGB 7.6 (L) 12/27/2021  ? HCT 25.9 (L) 12/27/2021  ? MCV 101.6 (H) 12/27/2021  ? PLT 160 12/27/2021  ? ?Lab Results  ?Component Value Date  ? FERRITIN 357 (H) 11/28/2021  ? IRON 82 11/28/2021  ? TIBC 392 11/28/2021  ? UIBC 310 11/28/2021  ? IRONPCTSAT 21 11/28/2021  ? ?Lab Results  ?Component Value Date  ? RETICCTPCT 8.9 (H) 12/27/2021  ? RBC 2.53 (L) 12/27/2021  ? ?No results found for: KPAFRELGTCHN, LAMBDASER, KAPLAMBRATIO ?No results found for: IGGSERUM, IGA, IGMSERUM ?No results found for: TOTALPROTELP, ALBUMINELP, A1GS, A2GS, BETS, BETA2SER, GAMS,  MSPIKE, SPEI ?  Chemistry   ?   ?Component Value Date/Time  ? NA 140 11/28/2021 1243  ? NA 143 10/26/2021 1113  ? K 4.4 11/28/2021 1243  ? CL 103 11/28/2021 1243  ? CO2 28 11/28/2021 1243  ? BUN 27 (H) 11/28/2021 1243  ? BUN 26 10/26/2021 1113  ? CREATININE 0.93 11/28/2021 1243  ? CREATININE 0.96 08/30/2021 1012  ? CREATININE 0.81 04/10/2021 1557  ?    ?Component Value Date/Time  ? CALCIUM 9.2 11/28/2021 1243  ? ALKPHOS 120 (H) 11/28/2021 1243  ? AST 46 (H) 11/28/2021 1243  ? AST 31 08/30/2021 1012  ? ALT 36 (H) 11/28/2021 1243  ? ALT 27 08/30/2021 1012  ? BILITOT 0.6 11/28/2021 1243  ? BILITOT 0.7 08/30/2021 1012  ?  ? ? ? ?Impression and Plan: Ms. Gendron is a very pleasant 81 yo caucasian female with iron deficiency anemia secondary to intermittent GI blood loss and malabsorption. ?Hgb is down to 7.6 and she will need to get 2 units of blood.   ?We will give IV iron today and again for two more doses.  ?I have reached out to cardiology to ask if they would like her to hold her Eliquis. Awaiting answer.  ?Follow-up in 1 month.  ? ?Eileen Stanford, NP ?4/26/202311:08 AM ? ?

## 2021-12-27 NOTE — Telephone Encounter (Signed)
Left a voicemail on patient's daughter Misty's personal voicemail instructing the patient to stop taking Eliqus per cardiologist Dr. Blanchie Dessert instruction and follow-up with GI for further eval. Referral placed for urgent GI new patient appointment. Call back number left so she can confirm receipt of this message.  ?

## 2021-12-27 NOTE — Patient Instructions (Signed)

## 2021-12-28 ENCOUNTER — Inpatient Hospital Stay: Payer: Medicare HMO

## 2021-12-28 ENCOUNTER — Telehealth: Payer: Self-pay | Admitting: *Deleted

## 2021-12-28 DIAGNOSIS — D649 Anemia, unspecified: Secondary | ICD-10-CM

## 2021-12-28 DIAGNOSIS — D500 Iron deficiency anemia secondary to blood loss (chronic): Secondary | ICD-10-CM

## 2021-12-28 DIAGNOSIS — I4891 Unspecified atrial fibrillation: Secondary | ICD-10-CM | POA: Diagnosis not present

## 2021-12-28 DIAGNOSIS — K31811 Angiodysplasia of stomach and duodenum with bleeding: Secondary | ICD-10-CM

## 2021-12-28 DIAGNOSIS — Z7901 Long term (current) use of anticoagulants: Secondary | ICD-10-CM | POA: Diagnosis not present

## 2021-12-28 DIAGNOSIS — K922 Gastrointestinal hemorrhage, unspecified: Secondary | ICD-10-CM | POA: Diagnosis not present

## 2021-12-28 DIAGNOSIS — K909 Intestinal malabsorption, unspecified: Secondary | ICD-10-CM | POA: Diagnosis not present

## 2021-12-28 DIAGNOSIS — D508 Other iron deficiency anemias: Secondary | ICD-10-CM | POA: Diagnosis not present

## 2021-12-28 MED ORDER — SODIUM CHLORIDE 0.9% IV SOLUTION
250.0000 mL | Freq: Once | INTRAVENOUS | Status: AC
  Administered 2021-12-28: 250 mL via INTRAVENOUS

## 2021-12-28 MED ORDER — DIPHENHYDRAMINE HCL 25 MG PO CAPS
25.0000 mg | ORAL_CAPSULE | Freq: Once | ORAL | Status: AC
  Administered 2021-12-28: 25 mg via ORAL
  Filled 2021-12-28: qty 1

## 2021-12-28 MED ORDER — ACETAMINOPHEN 325 MG PO TABS
650.0000 mg | ORAL_TABLET | Freq: Once | ORAL | Status: AC
  Administered 2021-12-28: 650 mg via ORAL
  Filled 2021-12-28: qty 2

## 2021-12-28 NOTE — Patient Instructions (Signed)
Anemia ? ?Anemia is a condition in which there is not enough red blood cells or hemoglobin in the blood. Hemoglobin is a substance in red blood cells that carries oxygen. ?When you do not have enough red blood cells or hemoglobin (are anemic), your body cannot get enough oxygen and your organs may not work properly. As a result, you may feel very tired or have other problems. ?What are the causes? ?Common causes of anemia include: ?Excessive bleeding. Anemia can be caused by excessive bleeding inside or outside the body, including bleeding from the intestines or from heavy menstrual periods in females. ?Poor nutrition. ?Long-lasting (chronic) kidney, thyroid, and liver disease. ?Bone marrow disorders, spleen problems, and blood disorders. ?Cancer and treatments for cancer. ?HIV (human immunodeficiency virus) and AIDS (acquired immunodeficiency syndrome). ?Infections, medicines, and autoimmune disorders that destroy red blood cells. ?What are the signs or symptoms? ?Symptoms of this condition include: ?Minor weakness. ?Dizziness. ?Headache, or difficulties concentrating and sleeping. ?Heartbeats that feel irregular or faster than normal (palpitations). ?Shortness of breath, especially with exercise. ?Pale skin, lips, and nails, or cold hands and feet. ?Indigestion and nausea. ?Symptoms may occur suddenly or develop slowly. If your anemia is mild, you may not have symptoms. ?How is this diagnosed? ?This condition is diagnosed based on blood tests, your medical history, and a physical exam. In some cases, a test may be needed in which cells are removed from the soft tissue inside of a bone and looked at under a microscope (bone marrow biopsy). Your health care provider may also check your stool (feces) for blood and may do additional testing to look for the cause of your bleeding. ?Other tests may include: ?Imaging tests, such as a CT scan or MRI. ?A procedure to see inside your esophagus and stomach (endoscopy). ?A  procedure to see inside your colon and rectum (colonoscopy). ?How is this treated? ?Treatment for this condition depends on the cause. If you continue to lose a lot of blood, you may need to be treated at a hospital. Treatment may include: ?Taking supplements of iron, vitamin Z30, or folic acid. ?Taking a hormone medicine (erythropoietin) that can help to stimulate red blood cell growth. ?Having a blood transfusion. This may be needed if you lose a lot of blood. ?Making changes to your diet. ?Having surgery to remove your spleen. ?Follow these instructions at home: ?Take over-the-counter and prescription medicines only as told by your health care provider. ?Take supplements only as told by your health care provider. ?Follow any diet instructions that you were given by your health care provider. ?Keep all follow-up visits as told by your health care provider. This is important. ?Contact a health care provider if: ?You develop new bleeding anywhere in the body. ?Get help right away if: ?You are very weak. ?You are short of breath. ?You have pain in your abdomen or chest. ?You are dizzy or feel faint. ?You have trouble concentrating. ?You have bloody stools, black stools, or tarry stools. ?You vomit repeatedly or you vomit up blood. ?These symptoms may represent a serious problem that is an emergency. Do not wait to see if the symptoms will go away. Get medical help right away. Call your local emergency services (911 in the [place] Do not drive yourself to the hospital. ?Summary ?Anemia is a condition in which you do not have enough red blood cells or enough of a substance in your red blood cells that carries oxygen (hemoglobin). ?Symptoms may occur suddenly or develop slowly. ?If your anemia  is mild, you may not have symptoms. ?This condition is diagnosed with blood tests, a medical history, and a physical exam. Other tests may be needed. ?Treatment for this condition depends on the cause of the anemia. ?This  information is not intended to replace advice given to you by your health care provider. Make sure you discuss any questions you have with your health care provider. ?Document Revised: 07/04/2021 Document Reviewed: 07/28/2019 ?Elsevier Patient Education ? [employer]. ? ?

## 2021-12-28 NOTE — Telephone Encounter (Signed)
Call received from patient's daughter Lanice Schwab to confirm that she received Sarah's message yesterday and would like to know the name of the GI MD that urgent referral was sent to.  Misty notified that urgent referral was sent to Dr. Doristine Locks.  Misty is appreciative of assistance and has no further questions at this time.  ?

## 2021-12-29 LAB — TYPE AND SCREEN
ABO/RH(D): O POS
Antibody Screen: NEGATIVE
Unit division: 0
Unit division: 0

## 2021-12-29 LAB — BPAM RBC
Blood Product Expiration Date: 202305252359
Blood Product Expiration Date: 202305252359
ISSUE DATE / TIME: 202304270811
ISSUE DATE / TIME: 202304270811
Unit Type and Rh: 5100
Unit Type and Rh: 5100

## 2022-01-04 ENCOUNTER — Inpatient Hospital Stay: Payer: Medicare HMO | Attending: Hematology & Oncology

## 2022-01-04 VITALS — BP 117/49 | HR 58 | Temp 98.1°F | Resp 17

## 2022-01-04 DIAGNOSIS — Z7901 Long term (current) use of anticoagulants: Secondary | ICD-10-CM | POA: Insufficient documentation

## 2022-01-04 DIAGNOSIS — K909 Intestinal malabsorption, unspecified: Secondary | ICD-10-CM | POA: Insufficient documentation

## 2022-01-04 DIAGNOSIS — D500 Iron deficiency anemia secondary to blood loss (chronic): Secondary | ICD-10-CM

## 2022-01-04 DIAGNOSIS — D508 Other iron deficiency anemias: Secondary | ICD-10-CM | POA: Diagnosis not present

## 2022-01-04 DIAGNOSIS — K922 Gastrointestinal hemorrhage, unspecified: Secondary | ICD-10-CM | POA: Diagnosis not present

## 2022-01-04 DIAGNOSIS — I4891 Unspecified atrial fibrillation: Secondary | ICD-10-CM | POA: Diagnosis not present

## 2022-01-04 DIAGNOSIS — K31811 Angiodysplasia of stomach and duodenum with bleeding: Secondary | ICD-10-CM

## 2022-01-04 MED ORDER — SODIUM CHLORIDE 0.9 % IV SOLN: Freq: Once | INTRAVENOUS | Status: AC

## 2022-01-04 MED ORDER — SODIUM CHLORIDE 0.9 % IV SOLN
300.0000 mg | Freq: Once | INTRAVENOUS | Status: AC
  Administered 2022-01-04: 300 mg via INTRAVENOUS
  Filled 2022-01-04: qty 300

## 2022-01-04 NOTE — Patient Instructions (Signed)

## 2022-01-05 ENCOUNTER — Ambulatory Visit: Payer: Medicare HMO | Admitting: General Practice

## 2022-01-10 ENCOUNTER — Inpatient Hospital Stay: Payer: Medicare HMO

## 2022-01-10 VITALS — BP 119/73 | HR 67 | Temp 97.9°F | Resp 18

## 2022-01-10 DIAGNOSIS — I4891 Unspecified atrial fibrillation: Secondary | ICD-10-CM | POA: Diagnosis not present

## 2022-01-10 DIAGNOSIS — K909 Intestinal malabsorption, unspecified: Secondary | ICD-10-CM | POA: Diagnosis not present

## 2022-01-10 DIAGNOSIS — Z7901 Long term (current) use of anticoagulants: Secondary | ICD-10-CM | POA: Diagnosis not present

## 2022-01-10 DIAGNOSIS — K31811 Angiodysplasia of stomach and duodenum with bleeding: Secondary | ICD-10-CM

## 2022-01-10 DIAGNOSIS — D500 Iron deficiency anemia secondary to blood loss (chronic): Secondary | ICD-10-CM

## 2022-01-10 DIAGNOSIS — D508 Other iron deficiency anemias: Secondary | ICD-10-CM | POA: Diagnosis not present

## 2022-01-10 DIAGNOSIS — K922 Gastrointestinal hemorrhage, unspecified: Secondary | ICD-10-CM | POA: Diagnosis not present

## 2022-01-10 MED ORDER — SODIUM CHLORIDE 0.9 % IV SOLN
300.0000 mg | Freq: Once | INTRAVENOUS | Status: AC
  Administered 2022-01-10: 300 mg via INTRAVENOUS
  Filled 2022-01-10: qty 300

## 2022-01-10 MED ORDER — SODIUM CHLORIDE 0.9 % IV SOLN: Freq: Once | INTRAVENOUS | Status: AC

## 2022-01-10 NOTE — Patient Instructions (Signed)

## 2022-01-22 ENCOUNTER — Other Ambulatory Visit: Payer: Self-pay | Admitting: Internal Medicine

## 2022-01-22 ENCOUNTER — Other Ambulatory Visit: Payer: Self-pay | Admitting: Medical

## 2022-01-22 NOTE — Telephone Encounter (Signed)
Requesting: tramadol 50mg   Contract: None UDS: None Last Visit: 11/28/21 Next Visit: None Last Refill: 11/28/21 #10 and 0RF  Please Advise  Did give refill but want to know if the una boots worked for lower extremities. I had ordered and homehealth was about to come out. So would you ask pt to follow up with me in 10 days or so.   Mackie Pai, PA-C

## 2022-01-26 ENCOUNTER — Telehealth: Payer: Self-pay | Admitting: Medical

## 2022-01-26 ENCOUNTER — Telehealth: Payer: Self-pay

## 2022-01-26 MED ORDER — METOPROLOL TARTRATE 25 MG PO TABS: ORAL_TABLET | ORAL | 0 refills | Status: DC

## 2022-01-26 MED ORDER — POTASSIUM CHLORIDE ER 10 MEQ PO CPCR: 10.0000 meq | ORAL_CAPSULE | Freq: Every day | ORAL | 2 refills | Status: DC

## 2022-01-26 NOTE — Telephone Encounter (Signed)
Pt is requesting to get a refill on previous rx. Please advise.   Medication:  potassium chloride (MICRO-K) 10 MEQ CR capsule  Has the patient contacted their pharmacy? Yes.     Preferred Pharmacy:  [id number] [place], [place] - [address] AT [facility]   [address] [place] [place] [address]  Phone:  [phone]  Fax:  [phone]

## 2022-01-26 NOTE — Telephone Encounter (Signed)
Refill complete with other phone note.

## 2022-01-26 NOTE — Telephone Encounter (Signed)
Patients daughter Rojelio Brenner) called requesting change/edit instructions on Metoprolol.  She states patient has always taken Metoprolol 25mg , 1 tab BID. Current Rx if for 1/2 tab BID.  Last OV note states patient takes 1/2 tab BID per pt, however this is not the case and daughter thinks pt was confused.  Daughter requesting refill and sig change.   Will refill 1 month until PCP back in office to approve change in sig.  Also refilled Micro-K (previous call/request).    Okay to change Rx back to Metoprolol 25 mg BID? Daughter reports normal BP and heart rate.

## 2022-01-30 ENCOUNTER — Inpatient Hospital Stay: Payer: Medicare HMO

## 2022-01-30 ENCOUNTER — Other Ambulatory Visit: Payer: Self-pay

## 2022-01-30 ENCOUNTER — Inpatient Hospital Stay: Payer: Medicare HMO | Admitting: Family

## 2022-01-30 ENCOUNTER — Encounter: Payer: Self-pay | Admitting: Family

## 2022-01-30 VITALS — BP 148/59 | HR 60 | Temp 98.0°F | Resp 18 | Ht <= 58 in

## 2022-01-30 DIAGNOSIS — D508 Other iron deficiency anemias: Secondary | ICD-10-CM | POA: Diagnosis not present

## 2022-01-30 DIAGNOSIS — K31811 Angiodysplasia of stomach and duodenum with bleeding: Secondary | ICD-10-CM

## 2022-01-30 DIAGNOSIS — I4891 Unspecified atrial fibrillation: Secondary | ICD-10-CM | POA: Diagnosis not present

## 2022-01-30 DIAGNOSIS — K922 Gastrointestinal hemorrhage, unspecified: Secondary | ICD-10-CM | POA: Diagnosis not present

## 2022-01-30 DIAGNOSIS — D649 Anemia, unspecified: Secondary | ICD-10-CM

## 2022-01-30 DIAGNOSIS — K909 Intestinal malabsorption, unspecified: Secondary | ICD-10-CM | POA: Diagnosis not present

## 2022-01-30 DIAGNOSIS — D500 Iron deficiency anemia secondary to blood loss (chronic): Secondary | ICD-10-CM

## 2022-01-30 DIAGNOSIS — Z7901 Long term (current) use of anticoagulants: Secondary | ICD-10-CM | POA: Diagnosis not present

## 2022-01-30 LAB — CMP (CANCER CENTER ONLY)
ALT: 27 U/L (ref 0–44)
AST: 34 U/L (ref 15–41)
Albumin: 4.1 g/dL (ref 3.5–5.0)
Alkaline Phosphatase: 133 U/L — ABNORMAL HIGH (ref 38–126)
Anion gap: 9 (ref 5–15)
BUN: 20 mg/dL (ref 8–23)
CO2: 32 mmol/L (ref 22–32)
Calcium: 9.8 mg/dL (ref 8.9–10.3)
Chloride: 97 mmol/L — ABNORMAL LOW (ref 98–111)
Creatinine: 0.82 mg/dL (ref 0.44–1.00)
GFR, Estimated: >60 mL/min (ref >60)
Glucose, Bld: 175 mg/dL — ABNORMAL HIGH (ref 70–99)
Potassium: 3.8 mmol/L (ref 3.5–5.1)
Sodium: 138 mmol/L (ref 135–145)
Total Bilirubin: 0.6 mg/dL (ref 0.3–1.2)
Total Protein: 7.8 g/dL (ref 6.5–8.1)

## 2022-01-30 LAB — CBC WITH DIFFERENTIAL (CANCER CENTER ONLY)
Abs Immature Granulocytes: 0.12 10*3/uL — ABNORMAL HIGH (ref 0.00–0.07)
Basophils Absolute: 0.1 10*3/uL (ref 0.0–0.1)
Basophils Relative: 1 %
Eosinophils Absolute: 0.2 10*3/uL (ref 0.0–0.5)
Eosinophils Relative: 3 %
HCT: 42.9 % (ref 36.0–46.0)
Hemoglobin: 12.9 g/dL (ref 12.0–15.0)
Immature Granulocytes: 2 %
Lymphocytes Relative: 12 %
Lymphs Abs: 0.9 10*3/uL (ref 0.7–4.0)
MCH: 28 pg (ref 26.0–34.0)
MCHC: 30.1 g/dL (ref 30.0–36.0)
MCV: 93.3 fL (ref 80.0–100.0)
Monocytes Absolute: 0.7 10*3/uL (ref 0.1–1.0)
Monocytes Relative: 8 %
Neutro Abs: 6.1 10*3/uL (ref 1.7–7.7)
Neutrophils Relative %: 74 %
Platelet Count: 234 10*3/uL (ref 150–400)
RBC: 4.6 MIL/uL (ref 3.87–5.11)
RDW: 16.3 % — ABNORMAL HIGH (ref 11.5–15.5)
WBC Count: 8.1 10*3/uL (ref 4.0–10.5)
nRBC: 0 % (ref 0.0–0.2)

## 2022-01-30 LAB — RETICULOCYTES
Immature Retic Fract: 11.7 % (ref 2.3–15.9)
RBC.: 4.61 MIL/uL (ref 3.87–5.11)
Retic Count, Absolute: 77.9 10*3/uL (ref 19.0–186.0)
Retic Ct Pct: 1.7 % (ref 0.4–3.1)

## 2022-01-30 LAB — IRON AND IRON BINDING CAPACITY (CC-WL,HP ONLY)
Iron: 76 ug/dL (ref 28–170)
Saturation Ratios: 21 % (ref 10.4–31.8)
TIBC: 371 ug/dL (ref 250–450)
UIBC: 295 ug/dL (ref 148–442)

## 2022-01-30 LAB — SAMPLE TO BLOOD BANK

## 2022-01-30 LAB — FERRITIN: Ferritin: 155 ng/mL (ref 11–307)

## 2022-01-30 MED ORDER — METOPROLOL TARTRATE 25 MG PO TABS: ORAL_TABLET | ORAL | 0 refills | Status: DC

## 2022-01-30 NOTE — Progress Notes (Signed)
Hematology and Oncology Follow Up Visit  Taylor Jenkins [id number] [birth year] 81 y.o. 01/30/2022   Principle Diagnosis:  Iron deficiency anemia --patient on Eliquis/malabsorption   Current Therapy:        IV iron as indicated Transfusional support as needed   Interim History:  Ms. Marsteller is here today with her daughter for follow-up. She is having some fatigue and mild SOB but overall is feeling better since her last visit.  No obvious blood loss noted. No abnormal bruising or petechiae.  She is off of Eliquis right now due to GI bleed. She sees cardiology again in June to re-evaluate atrial fib and treatment plan.  Hgb today is improved at 12.9, MCV 93, platelets 234 and WBC count 8.1.  No fever, chills, n/v, cough, rash, dizziness, chest pain, abdominal pain or changes in bowel or bladder habit.  No swelling, tenderness, numbness or tingling in her extremities at this time.  No falls or syncope to report. She ambulates with a Rolator for added support.  Appetite and hydration are good. Did not stand for weight today.   ECOG Performance Status: 2 - Symptomatic, <50% confined to bed  Medications:  Allergies as of 01/30/2022       Reactions   Codeine Shortness Of Breath, Itching   Epinephrine Shortness Of Breath, Palpitations   Latex Anaphylaxis   Penicillins Anaphylaxis   Did it involve swelling of the face/tongue/throat, SOB, or low BP? Yes Did it involve sudden or severe rash/hives, skin peeling, or any reaction on the inside of your mouth or nose? No Did you need to seek medical attention at a hospital or doctor's office? Yes When did it last happen?      40+ years If all above answers are "NO", may proceed with cephalosporin use.   Prednisone Other (See Comments)   psychosis    Statins Other (See Comments)   Myalgias   Lidocaine Palpitations   Sulfa Antibiotics Rash        Medication List        Accurate as of Jan 30, 2022  9:53 AM. If you have any questions,  ask your nurse or doctor.          STOP taking these medications    azithromycin 500 MG tablet Commonly known as: ZITHROMAX Stopped by: Lottie Dawson, NP   doxycycline 100 MG tablet Commonly known as: VIBRA-TABS Stopped by: Lottie Dawson, NP   ferrous sulfate 325 (65 FE) MG tablet Stopped by: Lottie Dawson, NP   potassium chloride 10 MEQ CR capsule Commonly known as: MICRO-K Stopped by: Lottie Dawson, NP       TAKE these medications    allopurinol 100 MG tablet Commonly known as: ZYLOPRIM Take 1 tablet (100 mg total) by mouth daily.   calcium carbonate 750 MG chewable tablet Commonly known as: TUMS EX Chew 1 tablet by mouth as needed for heartburn.   Eliquis 5 MG Tabs tablet Generic drug: apixaban TAKE 1 TABLET BY MOUTH TWICE A DAY   furosemide 80 MG tablet Commonly known as: LASIX Take 1 tablet (80 mg total) by mouth daily.   hydrocortisone 2.5 % cream APPLY TO AFFECTED AREA TWICE A DAY   insulin NPH Human 100 UNIT/ML injection Commonly known as: NOVOLIN N Inject 50 Units into the skin in the morning and at bedtime.   levalbuterol 45 MCG/ACT inhaler Commonly known as: XOPENEX HFA INHALE 1 TO 2 PUFFS BY MOUTH EVERY 4 HOURS AS NEEDED FOR WHEEZE   losartan  25 MG tablet Commonly known as: COZAAR Take 1 tablet (25 mg total) by mouth daily.   metoprolol tartrate 25 MG tablet Commonly known as: LOPRESSOR 1/2 tab po bid   MULTIVITAMIN ADULT PO Take 1 tablet by mouth daily.   nitroGLYCERIN 0.4 MG SL tablet Commonly known as: NITROSTAT Place 1 tablet (0.4 mg total) under the tongue every 5 (five) minutes x 3 doses as needed for chest pain (or severe SOB).   nystatin powder Commonly known as: MYCOSTATIN/NYSTOP Apply 1 application topically once as needed (for skin iritation).   potassium chloride SA 20 MEQ tablet Commonly known as: KLOR-CON M Take 1 tablet (20 mEq total) by mouth daily.   traMADol 50 MG tablet Commonly known as: ULTRAM TAKE 1 TABLET  (50 MG TOTAL) BY MOUTH EVERY 6 (SIX) HOURS AS NEEDED FOR UP TO 5 DAYS FOR SEVERE PAIN.   vitamin C 250 MG tablet Commonly known as: ASCORBIC ACID Take 250 mg by mouth daily.   zinc gluconate 50 MG tablet Take 50 mg by mouth daily.        Allergies:  Allergies  Allergen Reactions   Codeine Shortness Of Breath and Itching   Epinephrine Shortness Of Breath and Palpitations   Latex Anaphylaxis   Penicillins Anaphylaxis    Did it involve swelling of the face/tongue/throat, SOB, or low BP? Yes Did it involve sudden or severe rash/hives, skin peeling, or any reaction on the inside of your mouth or nose? No Did you need to seek medical attention at a hospital or doctor's office? Yes When did it last happen?      40+ years If all above answers are "NO", may proceed with cephalosporin use.    Prednisone Other (See Comments)    psychosis    Statins Other (See Comments)    Myalgias    Lidocaine Palpitations   Sulfa Antibiotics Rash    Past Medical History, Surgical history, Social history, and Family History were reviewed and updated.  Review of Systems: All other 10 point review of systems is negative.   Physical Exam:  height is 4\' 9"  (1.448 m). Her oral temperature is 98 F (36.7 C). Her blood pressure is 148/59 (abnormal) and her pulse is 60. Her respiration is 18 and oxygen saturation is 99%.   Wt Readings from Last 3 Encounters:  11/28/21 234 lb 6.4 oz (106.3 kg)  11/28/21 234 lb 1.3 oz (106.2 kg)  10/26/21 227 lb (103 kg)    Ocular: Sclerae unicteric, pupils equal, round and reactive to light Ear-nose-throat: Oropharynx clear, dentition fair Lymphatic: No cervical or supraclavicular adenopathy Lungs no rales or rhonchi, good excursion bilaterally Heart regular rate and rhythm, no murmur appreciated Abd soft, nontender, positive bowel sounds MSK no focal spinal tenderness, no joint edema Neuro: non-focal, well-oriented, appropriate affect Breasts: Deferred   Lab  Results  Component Value Date   WBC 8.1 01/30/2022   HGB 12.9 01/30/2022   HCT 42.9 01/30/2022   MCV 93.3 01/30/2022   PLT 234 01/30/2022   Lab Results  Component Value Date   FERRITIN 42 12/27/2021   IRON 54 12/27/2021   TIBC 445 12/27/2021   UIBC 391 12/27/2021   IRONPCTSAT 12 12/27/2021   Lab Results  Component Value Date   RETICCTPCT 1.7 01/30/2022   RBC 4.61 01/30/2022   No results found for: KPAFRELGTCHN, LAMBDASER, KAPLAMBRATIO No results found for: IGGSERUM, IGA, IGMSERUM No results found for: TOTALPROTELP, ALBUMINELP, A1GS, A2GS, BETS, BETA2SER, [place], [place], SPEI   Chemistry  Component Value Date/Time   NA 140 11/28/2021 1243   NA 143 10/26/2021 1113   K 4.4 11/28/2021 1243   CL 103 11/28/2021 1243   CO2 28 11/28/2021 1243   BUN 27 (H) 11/28/2021 1243   BUN 26 10/26/2021 1113   CREATININE 0.93 11/28/2021 1243   CREATININE 0.96 08/30/2021 1012   CREATININE 0.81 04/10/2021 1557      Component Value Date/Time   CALCIUM 9.2 11/28/2021 1243   ALKPHOS 120 (H) 11/28/2021 1243   AST 46 (H) 11/28/2021 1243   AST 31 08/30/2021 1012   ALT 36 (H) 11/28/2021 1243   ALT 27 08/30/2021 1012   BILITOT 0.6 11/28/2021 1243   BILITOT 0.7 08/30/2021 1012       Impression and Plan: Ms. Uehara is a very pleasant 81 yo caucasian female with iron deficiency anemia secondary to intermittent GI blood loss and malabsorption. Iron studies are pending.  Follow-up in 2 months.   Lottie Dawson, NP 5/30/20239:53 AM

## 2022-01-30 NOTE — Telephone Encounter (Signed)
Taylor Jenkins called stating insurance wouldn't cover medication and it was $11 per pill for medication also stated the sig on the medication was wrong and it needed to be 1 tablet BID , sig changed & medication sent to pharmacy , insurance did cover medication with sig change

## 2022-02-04 NOTE — Progress Notes (Unsigned)
Cardiology Office Note:    Date:  02/06/2022   ID:  Taylor Jenkins, DOB 10-24-1940, MRN [id number]  PCP:  Taylor Pai, PA-C   Early Providers Cardiologist:  Taylor Casino, MD      Referring MD: Taylor Pai, PA-C   Follow-up for atrial fibrillation and CHF  History of Present Illness:    Taylor Jenkins is a 81 y.o. female with a hx of aortic stenosis status post TAVR 0000000, HTN, diastolic CHF, insulin-dependent diabetes mellitus, permanent atrial fibrillation on Eliquis, chronic anemia, prior AVMs clipped in 2018, and mild-moderate nonobstructive CAD by catheterization 2/20.  She was seen in the atrial fibrillation clinic 02/07/2021.  She had contacted the [facility] office with complaints of weakness and shortness of breath after having a COVID infection in early May.  It was noted that her atrial fibrillation continued to be rate controlled.  She did appear to be somewhat fluid volume overloaded.  She reported significant shortness of breath with minimal physical activity.  On review of her echocardiogram that showed left ventricular hypertrophy.  Her increased work of breathing was her main complaint and was new for her.  It was felt that her shortness of breath preceded her COVID infection.  Of note her hemoglobin was 7.4 in May and she received 2 units of blood and iron infusion at that time.  On recheck her hemoglobin was 14.3 on 08/30/2021.  She denied fever and chills.  Her medications were continued.  She presented to the clinic 10/26/2021 for follow-up evaluation stated she was seen 2 days prior by Dr. Larose Jenkins who increased her furosemide.  She reported that she had gained around 20 pounds over the last 3 months.  She reported that she had not been eating large quantities of food because she had a poor appetite.  She had been drinking about 80-85 ounces of liquid per day.  She stopped weighing herself because she was frustrated with her weight gain.  We reviewed her  previous echocardiogram.  Her daughter RN presented with her and reported she occasionally gaves her an extra dose of metoprolol for increased heart rate.  She reported she was started on home O2 and had been using it through the day with activity (2 L).  We reviewed the importance of daily weights, fluid restriction, low-salt diet, and physical activity.  I  ordered a BMP , continued current furosemide dosing, planned follow-up in 1 month, and gave the salty 6 diet sheet.  She presents to the clinic today for follow-up evaluation states she had an episode where her hemoglobin dropped from 13 to 7 around 2 - 3 months ago.  Her apixaban was stopped at that time and she started enteric-coated aspirin.  Her hemoglobin on last check was over 12.  Her weight today has improved to 228 pounds.  She continues on 2 L nasal cannula with activity and as needed.  She typically does not need it through the day.  She is slowly increasing her physical activity and reports that her endurance is slowly returning.  She has been doing all of her activities of daily living.  She also does all of her own cooking and cleaning except vacuuming.  She has not needed 80 mg of Lasix and has only been taking 40 mg daily.  I will restart her apixaban, stop aspirin, refill losartan, change furosemide to 40 mg daily, continue daily weights, give salty 6 diet sheet, and plan follow-up in 4 to 6 months.  Today she denies chest pain, palpitations, melena, hematuria, hemoptysis, diaphoresis, weakness, presyncope, syncope, orthopnea, and PND.   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   CAD (coronary artery disease) 2016   non-obstructive at cath   Chronic diastolic CHF (congestive heart failure) (HCC)    Diabetes mellitus without complication (HCC)     type 2   HLD (hyperlipidemia)    Hypertension    Iron deficiency anemia due to chronic blood loss 11/22/2020   S/P TAVR (transcatheter aortic valve replacement)    Severe aortic stenosis 04/26/2017    Past Surgical History:  Procedure Laterality Date   LEFT HEART CATHETERIZATION WITH CORONARY ANGIOGRAM N/A 09/27/2014   Non-obstructive disease, CRF reduction recommended. Procedure: LEFT HEART CATHETERIZATION WITH CORONARY ANGIOGRAM;  Surgeon: Micheline Chapman, MD;  Location: [facility] CATH LAB;  Service: Cardiovascular;  Laterality: N/A;   RIGHT/LEFT HEART CATH AND CORONARY ANGIOGRAPHY N/A 10/30/2018   Procedure: RIGHT/LEFT HEART CATH AND CORONARY ANGIOGRAPHY;  Surgeon: Kathleene Hazel, MD;  Location: [facility] INVASIVE CV LAB;  Service: Cardiovascular;  Laterality: N/A;   TEE WITHOUT CARDIOVERSION N/A 11/18/2018   Procedure: TRANSESOPHAGEAL ECHOCARDIOGRAM (TEE);  Surgeon: Kathleene Hazel, MD;  Location: [facility] INVASIVE CV LAB;  Service: Open Heart Surgery;  Laterality: N/A;   TRANSCATHETER AORTIC VALVE REPLACEMENT, TRANSFEMORAL N/A 11/18/2018   Procedure: TRANSCATHETER AORTIC VALVE REPLACEMENT, TRANSFEMORAL;  Surgeon: Kathleene Hazel, MD;  Location: [facility] INVASIVE CV LAB;  Service: Open Heart Surgery;  Laterality: N/A;   TUBAL LIGATION      Current Medications: Current Meds  Medication Sig   allopurinol (ZYLOPRIM) 100 MG tablet Take 1 tablet (100 mg total) by mouth daily.   calcium carbonate (TUMS EX) 750 MG chewable tablet Chew 1 tablet by mouth as needed for heartburn.    hydrocortisone 2.5 % cream APPLY TO AFFECTED AREA TWICE A DAY   insulin NPH Human (NOVOLIN N) 100 UNIT/ML injection Inject 50 Units into the skin in the morning and at bedtime.    levalbuterol (XOPENEX HFA) 45 MCG/ACT inhaler INHALE 1 TO 2 PUFFS BY MOUTH EVERY 4 HOURS AS NEEDED FOR WHEEZE   metoprolol tartrate (LOPRESSOR) 25 MG tablet Take 1 tablet BID   Multiple Vitamin (MULTIVITAMIN ADULT PO) Take 1 tablet by mouth  daily.   nitroGLYCERIN (NITROSTAT) 0.4 MG SL tablet Place 1 tablet (0.4 mg total) under the tongue every 5 (five) minutes x 3 doses as needed for chest pain (or severe SOB).   nystatin (MYCOSTATIN/NYSTOP) powder Apply 1 application topically once as needed (for skin iritation).   potassium chloride (KLOR-CON M) 20 MEQ tablet Take 1 tablet (20 mEq total) by mouth daily.   traMADol (ULTRAM) 50 MG tablet TAKE 1 TABLET (50 MG TOTAL) BY MOUTH EVERY 6 (SIX) HOURS AS NEEDED FOR UP TO 5 DAYS FOR SEVERE PAIN.   vitamin C (ASCORBIC ACID) 250 MG tablet Take 250 mg by mouth daily.   zinc gluconate 50 MG tablet Take 50 mg by mouth daily.   [DISCONTINUED] ELIQUIS 5 MG TABS tablet TAKE 1 TABLET BY MOUTH TWICE A DAY   [DISCONTINUED] furosemide (LASIX) 80 MG tablet Take 1 tablet (80 mg total) by mouth daily.   [DISCONTINUED] losartan (COZAAR) 25 MG tablet Take 1 tablet (25 mg total) by mouth daily.     Allergies:   Codeine,  Epinephrine, Latex, Penicillins, Prednisone, Statins, Lidocaine, and Sulfa antibiotics   Social History   Socioeconomic History   Marital status: Widowed    Spouse name: Not on file   Number of children: 4   Years of education: Not on file   Highest education level: Not on file  Occupational History   Occupation: Retired-Secretary  Tobacco Use   Smoking status: Never   Smokeless tobacco: Never  Vaping Use   Vaping Use: Never used  Substance and Sexual Activity   Alcohol use: No    Alcohol/week: 0.0 standard drinks   Drug use: No   Sexual activity: Not on file  Other Topics Concern   Not on file  Social History Narrative   Pt lives alone in [place].    Social Determinants of Health   Financial Resource Strain: Low Risk    Difficulty of Paying Living Expenses: Not hard at all  Food Insecurity: No Food Insecurity   Worried About Charity fundraiser in the Last Year: Never true   [place] in the Last Year: Never true  Transportation Needs: No Transportation Needs    Lack of Transportation (Medical): No   Lack of Transportation (Non-Medical): No  Physical Activity: Not on file  Stress: No Stress Concern Present   Feeling of Stress : Not at all  Social Connections: Moderately Isolated   Frequency of Communication with Friends and Family: More than three times a week   Frequency of Social Gatherings with Friends and Family: Twice a week   Attends Religious Services: More than 4 times per year   Active Member of [employer] or Organizations: No   Attends Archivist Meetings: Never   Marital Status: Widowed     Family History: The patient's family history includes Heart attack (age of onset: 67) in her brother; Transient ischemic attack in her mother.  ROS:   Please see the history of present illness.     All other systems reviewed and are negative.   Risk Assessment/Calculations:    CHA2DS2-VASc Score = 7    This indicates a 11.2% annual risk of stroke. The patient's score is based upon: CHF History: 1 HTN History: 1 Diabetes History: 1 Stroke History: 0 Vascular Disease History: 1 Age Score: 2 Gender Score: 1          Physical Exam:    VS:  BP (!) 146/84   Pulse 75   Ht 4\' 9"  (1.448 m)   Wt 228 lb 9.6 oz (103.7 kg)   BMI 49.47 kg/m     Wt Readings from Last 3 Encounters:  02/06/22 228 lb 9.6 oz (103.7 kg)  11/28/21 234 lb 6.4 oz (106.3 kg)  11/28/21 234 lb 1.3 oz (106.2 kg)     GEN:  Well nourished, well developed in no acute distress HEENT: Normal NECK: No JVD; No carotid bruits LYMPHATICS: No lymphadenopathy CARDIAC: Irregularly irregular, no murmurs, rubs, gallops RESPIRATORY:  Clear to auscultation without rales, wheezing or rhonchi  ABDOMEN: Soft, non-tender, non-distended MUSCULOSKELETAL:  No edema; No deformity  SKIN: Warm and dry NEUROLOGIC:  Alert and oriented x 3 PSYCHIATRIC:  Normal affect    EKGs/Labs/Other Studies Reviewed:    The following studies were reviewed today: Echocardiogram  03/20/2021  IMPRESSIONS     1. Left ventricular ejection fraction, by estimation, is 55 to 60%. The  left ventricle has normal function. The left ventricle has no regional  wall motion abnormalities. The left ventricular internal cavity size was  mildly dilated. Left ventricular  diastolic function could not be evaluated.   2. The right ventricle is poorly seen, but appears to be dilated and at  least moderately depressed. Right ventricular systolic function was not  well visualized. The right ventricular size is not well visualized.   3. Left atrial size was severely dilated.   4. The mitral valve is degenerative. No evidence of mitral valve  regurgitation. Mild mitral stenosis. The mean mitral valve gradient is 5.0  mmHg with average heart rate of 75 bpm. Moderate to severe mitral annular  calcification.   5. The perivalvular leak is in the 2 o'clock location, in the vicinity of  the left coronary artery ostium. The aortic valve has been  repaired/replaced. Aortic valve regurgitation is mild to moderate. There  is a 23 mm Sapien prosthetic (TAVR) valve  present in the aortic position. Procedure Date: 11/18/2018. Echo findings  are consistent with perivalvular leak of the aortic prosthesis. Aortic  valve mean gradient measures 12.9 mmHg. Aortic valve Vmax measures 2.48  m/s. Aortic valve acceleration time  measures 85 msec.   Comparison(s): Prior images reviewed side by side. The perivalvular leak  is in a similar location, but appears a little worse.   EKG: None today.  Recent Labs: 02/07/2021: TSH 2.144 04/10/2021: Brain Natriuretic Peptide 511 11/28/2021: Pro B Natriuretic peptide (BNP) 286.0 01/30/2022: ALT 27; BUN 20; Creatinine 0.82; Hemoglobin 12.9; Platelet Count 234; Potassium 3.8; Sodium 138  Recent Lipid Panel    Component Value Date/Time   CHOL 219 (H) 09/26/2014 0030   TRIG 231 (H) 09/26/2014 0030   HDL 43 09/26/2014 0030   CHOLHDL 5.1 09/26/2014 0030   VLDL 46 (H)  09/26/2014 0030   LDLCALC 130 (H) 09/26/2014 0030    ASSESSMENT & PLAN    Diastolic CHF-euvolemic today.  Weight stable.  Breathing is returned to baseline.  Previously reported increased work of breathing x2-3 months.  Was seen by Dr. Larose Jenkins who felt that she was fluid volume overloaded and increased furosemide.  Echocardiogram 03/20/2021 showed normal LVEF.  BMP 01/30/2022 stable. Continue furosemide, losartan, metoprolol, potassium Heart healthy low-sodium diet-salty 6-reviewed Maintain physical activity Daily weights-contact office with a weight increase of 2 pounds overnight or 5 pounds in a week Continue fluid restriction-64 ounces Continue lower extremity support stockings Refill losartan Decrease furosemide to 40 mg  Atrial fibrillation-heart rate today 75.  Denies episodes of irregular or accelerated heart rate.  CHA2DS2-VASc score 6-7.  Had stopped apixaban due to significant drop in hemoglobin.   Continue  metoprolol Restart apixaban Stop aspirin Heart healthy low-sodium diet Increase physical activity as tolerated  Aortic valve stenosis-status post TAVR 2020.  Echocardiogram 7/22 showed mild-moderate aortic valve regurgitation.  No increased DOE or activity intolerance. Follows with structural heart team  Normocytic normochromic anemia-hemoglobin 12.9 on 01/30/2022.  Denies recent bleeding issues. Follows with PCP   Disposition: Follow-up with Dr. Debara Pickett  in 4-6 month       Medication Adjustments/Labs and Tests Ordered: Current medicines are reviewed at length with the patient today.  Concerns regarding medicines are outlined above.  Orders Placed This Encounter  Procedures   EKG 12-Lead   Meds ordered this encounter  Medications   losartan (COZAAR) 25 MG tablet    Sig: Take 1 tablet (25 mg total) by mouth daily.    Dispense:  30 tablet    Refill:  6   apixaban (ELIQUIS) 5 MG TABS tablet    Sig: Take 1 tablet (5  mg total) by mouth 2 (two) times daily.     Dispense:  60 tablet    Refill:  6   furosemide (LASIX) 40 MG tablet    Sig: Take 1 tablet (40 mg total) by mouth daily.    Dispense:  30 tablet    Refill:  6    There are no Patient Instructions on file for this visit.   Signed, Deberah Pelton, NP  02/06/2022 9:49 AM      Notice: This dictation was prepared with Dragon dictation along with smaller phrase technology. Any transcriptional errors that result from this process are unintentional and may not be corrected upon review.  I spent 14 minutes examining this patient, reviewing medications, and using patient centered shared decision making involving her cardiac care.  Prior to her visit I spent greater than 20 minutes reviewing her past medical history,  medications, and prior cardiac tests.

## 2022-02-06 ENCOUNTER — Encounter: Payer: Self-pay | Admitting: General Practice

## 2022-02-06 ENCOUNTER — Ambulatory Visit: Payer: Medicare HMO | Admitting: General Practice

## 2022-02-06 VITALS — BP 146/84 | HR 75 | Ht <= 58 in | Wt 228.6 lb

## 2022-02-06 DIAGNOSIS — Z952 Presence of prosthetic heart valve: Secondary | ICD-10-CM

## 2022-02-06 DIAGNOSIS — I5032 Chronic diastolic (congestive) heart failure: Secondary | ICD-10-CM

## 2022-02-06 DIAGNOSIS — D500 Iron deficiency anemia secondary to blood loss (chronic): Secondary | ICD-10-CM | POA: Diagnosis not present

## 2022-02-06 DIAGNOSIS — I4819 Other persistent atrial fibrillation: Secondary | ICD-10-CM | POA: Diagnosis not present

## 2022-02-06 MED ORDER — LOSARTAN POTASSIUM 25 MG PO TABS: 25.0000 mg | ORAL_TABLET | Freq: Every day | ORAL | 6 refills | Status: DC

## 2022-02-06 MED ORDER — FUROSEMIDE 40 MG PO TABS: 40.0000 mg | ORAL_TABLET | Freq: Every day | ORAL | 6 refills | Status: DC

## 2022-02-06 MED ORDER — APIXABAN 5 MG PO TABS: 5.0000 mg | ORAL_TABLET | Freq: Two times a day (BID) | ORAL | 6 refills | Status: DC

## 2022-02-06 NOTE — Patient Instructions (Signed)
Medication Instructions:  STOP ASPIRIN  DECREASE FUROSEMIDE 40MG  DAILY  CONTINUE ELIQUIS 5MG  TWICE DAILY  *If you need a refill on your cardiac medications before your next appointment, please call your pharmacy*  Lab Work:   Testing/Procedures:  NONE    NONE  If you have labs (blood work) drawn today and your tests are completely normal, you will receive your results only by: MyChart Message (if you have MyChart) OR  A paper copy in the mail If you have any lab test that is abnormal or we need to change your treatment, we will call you to review the results.  Special Instructions PLEASE READ AND FOLLOW SALTY 6-ATTACHED-1,800mg  daily  PLEASE INCREASE PHYSICAL ACTIVITY AS TOLERATED   CONTINUE DAILY WEIGHTS  Follow-Up: Your next appointment:  4-6 month(s) In Person with , MD :1  At [facility], you and your health needs are our priority.  As part of our continuing mission to provide you with exceptional heart care, we have created designated Provider Care Teams.  These Care Teams include your primary Cardiologist (physician) and Advanced Practice Providers (APPs -  Physician Assistants and Nurse Practitioners) who all work together to provide you with the care you need, when you need it.   Important Information About Sugar               6 SALTY THINGS TO AVOID     1,800MG  DAILY

## 2022-02-21 NOTE — Progress Notes (Deleted)
Cardiology Office Note:    Date:  02/21/2022   ID:  Taylor Jenkins, DOB 1940-09-09, MRN [id number]  PCP:  Esperanza Richters, PA-C   [facility] Providers Cardiologist:  Chrystie Nose, MD      Referring MD: Esperanza Richters, PA-C   Follow-up for atrial fibrillation and CHF  History of Present Illness:    Taylor Jenkins is a 81 y.o. female with a hx of aortic stenosis status post TAVR 3/20, HTN, diastolic CHF, insulin-dependent diabetes mellitus, permanent atrial fibrillation on Eliquis, chronic anemia, prior AVMs clipped in 2018, and mild-moderate nonobstructive CAD by catheterization 2/20.  She was seen in the atrial fibrillation clinic 02/07/2021.  She had contacted the [employer] office with complaints of weakness and shortness of breath after having a COVID infection in early May.  It was noted that her atrial fibrillation continued to be rate controlled.  She did appear to be somewhat fluid volume overloaded.  She reported significant shortness of breath with minimal physical activity.  On review of her echocardiogram that showed left ventricular hypertrophy.  Her increased work of breathing was her main complaint and was new for her.  It was felt that her shortness of breath preceded her COVID infection.  Of note her hemoglobin was 7.4 in May and she received 2 units of blood and iron infusion at that time.  On recheck her hemoglobin was 14.3 on 08/30/2021.  She denied fever and chills.  Her medications were continued.  She presented to the clinic 10/26/2021 for follow-up evaluation stated she was seen 2 days prior by Dr. Drue Novel who increased her furosemide.  She reported that she had gained around 20 pounds over the last 3 months.  She reported that she had not been eating large quantities of food because she had a poor appetite.  She had been drinking about 80-85 ounces of liquid per day.  She stopped weighing herself because she was frustrated with her weight gain.  We reviewed her  previous echocardiogram.  Her daughter RN presented with her and reported she occasionally gaves her an extra dose of metoprolol for increased heart rate.  She reported she was started on home O2 and had been using it through the day with activity (2 L).  We reviewed the importance of daily weights, fluid restriction, low-salt diet, and physical activity.  I  ordered a BMP , continued current furosemide dosing, planned follow-up in 1 month, and gave the salty 6 diet sheet.  She presented to the clinic 02/06/2022 for follow-up evaluation stated she had an episode where her hemoglobin dropped from 13 to 7 around 2 - 3 months ago.  Her apixaban was stopped at that time and she started enteric-coated aspirin.  Her hemoglobin on last check was over 12.  Her weight  had improved to 228 pounds.  She continued on 2 L nasal cannula with activity and as needed.  She typically did not need it through the day.  She was slowly increasing her physical activity and reported that her endurance was slowly returning.  She had been doing all of her activities of daily living.  She also did all of her own cooking and cleaning except vacuuming.  She had not needed 80 mg of Lasix and had only been taking 40 mg daily.  I restarted her apixaban, stopped aspirin, refilled losartan, changed furosemide to 40 mg daily, continued daily weights, gave salty 6 diet sheet, and planned follow-up in 4 to 6 months.  She presents to the clinic today for follow-up evaluation states***  Today she denies chest pain, palpitations, melena, hematuria, hemoptysis, diaphoresis, weakness, presyncope, syncope, orthopnea, and PND.   Diastolic CHF-  Weight continues to be stable.  Breathing is at baseline.  Previously reported increased work of breathing x 2-3 months.  Was seen by Dr. Larose Kells who felt that she was fluid volume overloaded and increased furosemide.  Her echocardiogram 03/20/2021 showed normal LVEF.  Continue furosemide, losartan, metoprolol,  potassium Heart healthy low-sodium diet Maintain physical activity and increase as tolerated Continue Daily weights-contact office with a weight increase of 2 pounds overnight or 5 pounds in a week Continue fluid restriction-64 ounces-reviewed Continue lower extremity support stockings Decrease furosemide to 40 mg***  Atrial fibrillation-heart rate today ***75.  Compliant with medical therapy.  Denies accelerated heart rates.  CHA2DS2-VASc score 6-7.  Had stopped apixaban due to significant drop in hemoglobin.   Continue  metoprolol Restart apixaban*** Stop aspirin*** Heart healthy low-sodium diet Increase physical activity as tolerated  Aortic valve stenosis-no increased DOE or activity intolerance.  Status post TAVR 2020.  Echocardiogram 7/22 showed mild-moderate aortic valve regurgitation.  Follows with structural heart team  Normocytic normochromic anemia-hemoglobin recently stable 01/30/2022.  Continues to deny bleeding issues. Follows with PCP   Disposition: Follow-up with Dr. 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 Procedure: LEFT HEART CATHETERIZATION WITH CORONARY ANGIOGRAM;  Surgeon: Blane Ohara, MD;  Location: [facility] CATH LAB;   Service: Cardiovascular;  Laterality: N/A;   RIGHT/LEFT HEART CATH AND CORONARY ANGIOGRAPHY N/A 10/30/2018   Procedure: RIGHT/LEFT HEART CATH AND CORONARY ANGIOGRAPHY;  Surgeon: Burnell Blanks, MD;  Location: [place] CV LAB;  Service: Cardiovascular;  Laterality: N/A;   TEE WITHOUT CARDIOVERSION N/A 11/18/2018   Procedure: TRANSESOPHAGEAL ECHOCARDIOGRAM (TEE);  Surgeon: Burnell Blanks, MD;  Location: [place] CV LAB;  Service: Open Heart Surgery;  Laterality: N/A;   TRANSCATHETER AORTIC VALVE REPLACEMENT, TRANSFEMORAL N/A 11/18/2018   Procedure: TRANSCATHETER AORTIC VALVE REPLACEMENT, TRANSFEMORAL;  Surgeon: Burnell Blanks, MD;  Location: [place] CV LAB;  Service: Open Heart Surgery;  Laterality: N/A;   TUBAL LIGATION      Current Medications: No outpatient medications have been marked as taking for the 02/23/22 encounter (Appointment) with Deberah Pelton, NP.     Allergies:   Codeine, Epinephrine, Latex, Penicillins, Prednisone, Statins, Lidocaine, and Sulfa antibiotics   Social History   Socioeconomic History   Marital status: Widowed    Spouse name: Not on file   Number of children: 4   Years of education: Not on file   Highest education level: Not on file  Occupational History   Occupation: Retired-Secretary  Tobacco Use   Smoking status: Never  Smokeless tobacco: Never  Vaping Use   Vaping Use: Never used  Substance and Sexual Activity   Alcohol use: No    Alcohol/week: 0.0 standard drinks of alcohol   Drug use: No   Sexual activity: Not on file  Other Topics Concern   Not on file  Social History Narrative   Pt lives alone in [place].    Social Determinants of Health   Financial Resource Strain: Low Risk  (11/16/2021)   Overall Financial Resource Strain (CARDIA)    Difficulty of Paying Living Expenses: Not hard at all  Food Insecurity: No Food Insecurity (11/16/2021)   Hunger Vital Sign    Worried About Running Out of Food in  the Last Year: Never true    Ran Out of Food in the Last Year: Never true  Transportation Needs: No Transportation Needs (11/16/2021)   PRAPARE - Administrator, Civil Service (Medical): No    Lack of Transportation (Non-Medical): No  Physical Activity: Not on file  Stress: No Stress Concern Present (11/16/2021)   [employer] of Occupational Health - Occupational Stress Questionnaire    Feeling of Stress : Not at all  Social Connections: Moderately Isolated (11/16/2021)   Social Connection and Isolation Panel [NHANES]    Frequency of Communication with Friends and Family: More than three times a week    Frequency of Social Gatherings with Friends and Family: Twice a week    Attends Religious Services: More than 4 times per year    Active Member of [employer] or Organizations: No    Attends Banker Meetings: Never    Marital Status: Widowed     Family History: The patient's family history includes Heart attack (age of onset: 1) in her brother; Transient ischemic attack in her mother.  ROS:   Please see the history of present illness.     All other systems reviewed and are negative.   Risk Assessment/Calculations:    CHA2DS2-VASc Score = 7    This indicates a 11.2% annual risk of stroke. The patient's score is based upon: CHF History: 1 HTN History: 1 Diabetes History: 1 Stroke History: 0 Vascular Disease History: 1 Age Score: 2 Gender Score: 1          Physical Exam:    VS:  There were no vitals taken for this visit.    Wt Readings from Last 3 Encounters:  02/06/22 228 lb 9.6 oz (103.7 kg)  11/28/21 234 lb 6.4 oz (106.3 kg)  11/28/21 234 lb 1.3 oz (106.2 kg)     GEN:  Well nourished, well developed in no acute distress HEENT: Normal NECK: No JVD; No carotid bruits LYMPHATICS: No lymphadenopathy CARDIAC: Irregularly irregular, no murmurs, rubs, gallops RESPIRATORY:  Clear to auscultation without rales, wheezing or rhonchi  ABDOMEN:  Soft, non-tender, non-distended MUSCULOSKELETAL:  No edema; No deformity  SKIN: Warm and dry NEUROLOGIC:  Alert and oriented x 3 PSYCHIATRIC:  Normal affect    EKGs/Labs/Other Studies Reviewed:    The following studies were reviewed today: Echocardiogram 03/20/2021  IMPRESSIONS     1. Left ventricular ejection fraction, by estimation, is 55 to 60%. The  left ventricle has normal function. The left ventricle has no regional  wall motion abnormalities. The left ventricular internal cavity size was  mildly dilated. Left ventricular  diastolic function could not be evaluated.   2. The right ventricle is poorly seen, but appears to be dilated and at  least moderately depressed. Right ventricular systolic  function was not  well visualized. The right ventricular size is not well visualized.   3. Left atrial size was severely dilated.   4. The mitral valve is degenerative. No evidence of mitral valve  regurgitation. Mild mitral stenosis. The mean mitral valve gradient is 5.0  mmHg with average heart rate of 75 bpm. Moderate to severe mitral annular  calcification.   5. The perivalvular leak is in the 2 o'clock location, in the vicinity of  the left coronary artery ostium. The aortic valve has been  repaired/replaced. Aortic valve regurgitation is mild to moderate. There  is a 23 mm Sapien prosthetic (TAVR) valve  present in the aortic position. Procedure Date: 11/18/2018. Echo findings  are consistent with perivalvular leak of the aortic prosthesis. Aortic  valve mean gradient measures 12.9 mmHg. Aortic valve Vmax measures 2.48  m/s. Aortic valve acceleration time  measures 85 msec.   Comparison(s): Prior images reviewed side by side. The perivalvular leak  is in a similar location, but appears a little worse.   EKG: None today.  Recent Labs: 04/10/2021: Brain Natriuretic Peptide 511 11/28/2021: Pro B Natriuretic peptide (BNP) 286.0 01/30/2022: ALT 27; BUN 20; Creatinine 0.82;  Hemoglobin 12.9; Platelet Count 234; Potassium 3.8; Sodium 138  Recent Lipid Panel    Component Value Date/Time   CHOL 219 (H) 09/26/2014 0030   TRIG 231 (H) 09/26/2014 0030   HDL 43 09/26/2014 0030   CHOLHDL 5.1 09/26/2014 0030   VLDL 46 (H) 09/26/2014 0030   LDLCALC 130 (H) 09/26/2014 0030    ASSESSMENT & PLAN   ***       Medication Adjustments/Labs and Tests Ordered: Current medicines are reviewed at length with the patient today.  Concerns regarding medicines are outlined above.  No orders of the defined types were placed in this encounter.  No orders of the defined types were placed in this encounter.   There are no Patient Instructions on file for this visit.   Signed, Deberah Pelton, NP  02/21/2022 6:47 AM      Notice: This dictation was prepared with Dragon dictation along with smaller phrase technology. Any transcriptional errors that result from this process are unintentional and may not be corrected upon review.  I spent 14*** minutes examining this patient, reviewing medications, and using patient centered shared decision making involving her cardiac care.  Prior to her visit I spent greater than 20 minutes reviewing her past medical history,  medications, and prior cardiac tests.

## 2022-02-23 ENCOUNTER — Ambulatory Visit: Payer: Medicare HMO | Admitting: General Practice

## 2022-02-23 ENCOUNTER — Other Ambulatory Visit: Payer: Self-pay | Admitting: Medical

## 2022-02-28 ENCOUNTER — Encounter: Payer: Self-pay | Admitting: General Practice

## 2022-04-02 ENCOUNTER — Inpatient Hospital Stay (HOSPITAL_BASED_OUTPATIENT_CLINIC_OR_DEPARTMENT_OTHER): Payer: Medicare HMO | Admitting: Hematology & Oncology

## 2022-04-02 ENCOUNTER — Encounter: Payer: Self-pay | Admitting: Hematology & Oncology

## 2022-04-02 ENCOUNTER — Other Ambulatory Visit: Payer: Self-pay | Admitting: Internal Medicine

## 2022-04-02 ENCOUNTER — Other Ambulatory Visit: Payer: Self-pay | Admitting: Medical

## 2022-04-02 ENCOUNTER — Other Ambulatory Visit: Payer: Self-pay

## 2022-04-02 ENCOUNTER — Inpatient Hospital Stay: Payer: Medicare HMO | Attending: Hematology & Oncology

## 2022-04-02 ENCOUNTER — Ambulatory Visit (INDEPENDENT_AMBULATORY_CARE_PROVIDER_SITE_OTHER): Payer: Medicare HMO | Admitting: Medical

## 2022-04-02 ENCOUNTER — Telehealth: Payer: Self-pay

## 2022-04-02 ENCOUNTER — Telehealth: Payer: Self-pay | Admitting: Internal Medicine

## 2022-04-02 ENCOUNTER — Encounter: Payer: Self-pay | Admitting: Medical

## 2022-04-02 VITALS — BP 157/86 | HR 81 | Temp 98.2°F | Resp 18 | Ht <= 58 in | Wt 228.0 lb

## 2022-04-02 VITALS — BP 148/77 | HR 94 | Resp 18 | Ht <= 58 in | Wt 227.2 lb

## 2022-04-02 DIAGNOSIS — D649 Anemia, unspecified: Secondary | ICD-10-CM

## 2022-04-02 DIAGNOSIS — I4891 Unspecified atrial fibrillation: Secondary | ICD-10-CM | POA: Insufficient documentation

## 2022-04-02 DIAGNOSIS — K922 Gastrointestinal hemorrhage, unspecified: Secondary | ICD-10-CM | POA: Insufficient documentation

## 2022-04-02 DIAGNOSIS — D500 Iron deficiency anemia secondary to blood loss (chronic): Secondary | ICD-10-CM | POA: Diagnosis not present

## 2022-04-02 DIAGNOSIS — R739 Hyperglycemia, unspecified: Secondary | ICD-10-CM | POA: Diagnosis not present

## 2022-04-02 DIAGNOSIS — R0602 Shortness of breath: Secondary | ICD-10-CM

## 2022-04-02 DIAGNOSIS — K909 Intestinal malabsorption, unspecified: Secondary | ICD-10-CM | POA: Diagnosis not present

## 2022-04-02 DIAGNOSIS — I509 Heart failure, unspecified: Secondary | ICD-10-CM | POA: Diagnosis not present

## 2022-04-02 DIAGNOSIS — I10 Essential (primary) hypertension: Secondary | ICD-10-CM | POA: Diagnosis not present

## 2022-04-02 DIAGNOSIS — I350 Nonrheumatic aortic (valve) stenosis: Secondary | ICD-10-CM

## 2022-04-02 DIAGNOSIS — Z7901 Long term (current) use of anticoagulants: Secondary | ICD-10-CM | POA: Diagnosis not present

## 2022-04-02 DIAGNOSIS — K31811 Angiodysplasia of stomach and duodenum with bleeding: Secondary | ICD-10-CM

## 2022-04-02 DIAGNOSIS — D508 Other iron deficiency anemias: Secondary | ICD-10-CM | POA: Diagnosis not present

## 2022-04-02 LAB — CBC WITH DIFFERENTIAL (CANCER CENTER ONLY)
Abs Immature Granulocytes: 0.04 10*3/uL (ref 0.00–0.07)
Basophils Absolute: 0 10*3/uL (ref 0.0–0.1)
Basophils Relative: 0 %
Eosinophils Absolute: 0.4 10*3/uL (ref 0.0–0.5)
Eosinophils Relative: 5 %
HCT: 41.7 % (ref 36.0–46.0)
Hemoglobin: 12.2 g/dL (ref 12.0–15.0)
Immature Granulocytes: 1 %
Lymphocytes Relative: 13 %
Lymphs Abs: 1.1 10*3/uL (ref 0.7–4.0)
MCH: 27.2 pg (ref 26.0–34.0)
MCHC: 29.3 g/dL — ABNORMAL LOW (ref 30.0–36.0)
MCV: 93.1 fL (ref 80.0–100.0)
Monocytes Absolute: 0.7 10*3/uL (ref 0.1–1.0)
Monocytes Relative: 9 %
Neutro Abs: 6 10*3/uL (ref 1.7–7.7)
Neutrophils Relative %: 72 %
Platelet Count: 182 10*3/uL (ref 150–400)
RBC: 4.48 MIL/uL (ref 3.87–5.11)
RDW: 18.1 % — ABNORMAL HIGH (ref 11.5–15.5)
WBC Count: 8.2 10*3/uL (ref 4.0–10.5)
nRBC: 0 % (ref 0.0–0.2)

## 2022-04-02 LAB — IRON AND IRON BINDING CAPACITY (CC-WL,HP ONLY)
Iron: 61 ug/dL (ref 28–170)
Saturation Ratios: 16 % (ref 10.4–31.8)
TIBC: 388 ug/dL (ref 250–450)
UIBC: 327 ug/dL (ref 148–442)

## 2022-04-02 LAB — COMPREHENSIVE METABOLIC PANEL
ALT: 31 U/L (ref 0–35)
AST: 36 U/L (ref 0–37)
Albumin: 4 g/dL (ref 3.5–5.2)
Alkaline Phosphatase: 129 U/L — ABNORMAL HIGH (ref 39–117)
BUN: 18 mg/dL (ref 6–23)
CO2: 29 mEq/L (ref 19–32)
Calcium: 9.2 mg/dL (ref 8.4–10.5)
Chloride: 104 mEq/L (ref 96–112)
Creatinine, Ser: 0.74 mg/dL (ref 0.40–1.20)
GFR: 76.73 mL/min (ref >60.00)
Glucose, Bld: 120 mg/dL — ABNORMAL HIGH (ref 70–99)
Potassium: 4.5 mEq/L (ref 3.5–5.1)
Sodium: 141 mEq/L (ref 135–145)
Total Bilirubin: 0.6 mg/dL (ref 0.2–1.2)
Total Protein: 7.3 g/dL (ref 6.0–8.3)

## 2022-04-02 LAB — RETICULOCYTES
Immature Retic Fract: 26 % — ABNORMAL HIGH (ref 2.3–15.9)
RBC.: 4.48 MIL/uL (ref 3.87–5.11)
Retic Count, Absolute: 127.7 10*3/uL (ref 19.0–186.0)
Retic Ct Pct: 2.9 % (ref 0.4–3.1)

## 2022-04-02 LAB — BRAIN NATRIURETIC PEPTIDE: Pro B Natriuretic peptide (BNP): 418 pg/mL — ABNORMAL HIGH (ref 0.0–100.0)

## 2022-04-02 LAB — FERRITIN: Ferritin: 79 ng/mL (ref 11–307)

## 2022-04-02 LAB — SAMPLE TO BLOOD BANK

## 2022-04-02 LAB — HEMOGLOBIN A1C: Hgb A1c MFr Bld: 8.7 % — ABNORMAL HIGH (ref 4.6–6.5)

## 2022-04-02 MED ORDER — AZITHROMYCIN 500 MG PO TABS: 500.0000 mg | ORAL_TABLET | Freq: Every day | ORAL | 0 refills | Status: DC

## 2022-04-02 MED ORDER — IRON (FERROUS SULFATE) 325 (65 FE) MG PO TABS: 325.0000 mg | ORAL_TABLET | Freq: Every day | ORAL | 2 refills | Status: DC

## 2022-04-02 MED ORDER — LEVALBUTEROL TARTRATE 45 MCG/ACT IN AERO: INHALATION_SPRAY | RESPIRATORY_TRACT | 2 refills | Status: DC

## 2022-04-02 MED ORDER — NYSTATIN 100000 UNIT/GM EX POWD: 1.0000 | Freq: Three times a day (TID) | CUTANEOUS | 1 refills | Status: DC

## 2022-04-02 MED ORDER — AZITHROMYCIN 250 MG PO TABS: ORAL_TABLET | ORAL | 0 refills | Status: AC

## 2022-04-02 NOTE — Telephone Encounter (Signed)
   Patient Name: Taylor Jenkins  DOB: 03-09-1941 MRN: [id number]  Primary Cardiologist: Chrystie Nose, MD  Chart reviewed as part of pre-operative protocol coverage. Given past medical history and time since last visit, based on ACC/AHA guidelines, Taylor Jenkins would be at acceptable risk for the planned procedure without further cardiovascular testing.   Patient does require pre-op antibiotics for dental procedure. Penicillin allergy, so use azithromycin 500 mg x 1 30-60 minutes prior to dental work and this will be sent to her pharmacy.   Per office protocol, patient can hold Eliquis for 2 days prior to procedure.   Patient will not need bridging with Lovenox (enoxaparin) around procedure.   I will route this recommendation to the requesting party via Epic fax function and remove from pre-op pool.  Please call with questions.  Napoleon Form, Leodis Rains, NP 04/02/2022, 3:27 PM

## 2022-04-02 NOTE — Telephone Encounter (Signed)
Patient with diagnosis of atrial fibrillation on Eliquis for anticoagulation.    Procedure: 5 dental extractions, with bone grafting Date of procedure: 04/04/22   CHA2DS2-VASc Score = 7   This indicates a 11.2% annual risk of stroke. The patient's score is based upon: CHF History: 1 HTN History: 1 Diabetes History: 1 Stroke History: 0 Vascular Disease History: 1 Age Score: 2 Gender Score: 1    CrCl 67 (adjusted body weight) Platelet count 182  Patient does require pre-op antibiotics for dental procedure. Penicillin allergy, so use azithromycin 500 mg x 1 30-60 minutes prior to dental work  Per office protocol, patient can hold Eliquis for 2 days prior to procedure.   Patient will not need bridging with Lovenox (enoxaparin) around procedure.  **This guidance is not considered finalized until pre-operative APP has relayed final recommendations.**

## 2022-04-02 NOTE — Progress Notes (Signed)
Hematology and Oncology Follow Up Visit  Taylor Jenkins [id number] August 27, 1941 81 y.o. 04/02/2022   Principle Diagnosis:  Iron deficiency anemia --patient on Eliquis/malabsorption   Current Therapy:        IV iron as indicated -Venofer given on 01/10/2022 Transfusional support as needed   Interim History:  Taylor Jenkins is here today for follow-up.  She is doing okay.  She really has not done much this summer because of the hot humid air.  She does have a Lung issues.  When we last saw her back in May, her iron studies showed a ferritin of 155 with an iron saturation of 21%.  Taylor Jenkins iron back in early May.  Back in April, her ferritin was 42 with an iron saturation of 12%.  She has not noted any issues with bleeding.  There is been no melena or bright red blood per rectum.  She has had no cough.  She has had no nausea or vomiting.  There is been no rashes.  She has had little bit of leg swelling.  She has a performance status of about ECOG 2.     Medications:  Allergies as of 04/02/2022       Reactions   Codeine Shortness Of Breath, Itching   Epinephrine Shortness Of Breath, Palpitations   Latex Anaphylaxis   Penicillins Anaphylaxis   Did it involve swelling of the face/tongue/throat, SOB, or low BP? Yes Did it involve sudden or severe rash/hives, skin peeling, or any reaction on the inside of your mouth or nose? No Did you need to seek medical attention at a hospital or doctor's office? Yes When did it last happen?      40+ years If all above answers are "NO", may proceed with cephalosporin use.   Prednisone Other (See Comments)   psychosis    Statins Other (See Comments)   Myalgias   Lidocaine Palpitations   Sulfa Antibiotics Rash        Medication List        Accurate as of April 02, 2022 10:41 AM. If you have any questions, ask your nurse or doctor.          allopurinol 100 MG tablet Commonly known as: ZYLOPRIM Take 1 tablet (100 mg total) by mouth daily.    apixaban 5 MG Tabs tablet Commonly known as: Eliquis Take 1 tablet (5 mg total) by mouth 2 (two) times daily.   azithromycin 250 MG tablet Commonly known as: ZITHROMAX Take 2 tablets 30-60 minutes prior to surgery. Started by: Taylor Richters, PA-C   calcium carbonate 750 MG chewable tablet Commonly known as: TUMS EX Chew 1 tablet by mouth as needed for heartburn.   furosemide 40 MG tablet Commonly known as: LASIX Take 1 tablet (40 mg total) by mouth daily.   hydrocortisone 2.5 % cream APPLY TO AFFECTED AREA TWICE A DAY   insulin NPH Human 100 UNIT/ML injection Commonly known as: NOVOLIN N Inject 50 Units into the skin in the morning and at bedtime.   Iron (Ferrous Sulfate) 325 (65 Fe) MG Tabs Take 325 mg by mouth daily. Started by: Taylor Richters, PA-C   levalbuterol 45 MCG/ACT inhaler Commonly known as: XOPENEX HFA INHALE 1 TO 2 PUFFS BY MOUTH EVERY 4 HOURS AS NEEDED FOR WHEEZE   losartan 25 MG tablet Commonly known as: COZAAR Take 1 tablet (25 mg total) by mouth daily.   metoprolol tartrate 25 MG tablet Commonly known as: LOPRESSOR Take 1 tablet (25 mg total) by  mouth 2 (two) times daily.   MULTIVITAMIN ADULT PO Take 1 tablet by mouth daily.   nitroGLYCERIN 0.4 MG SL tablet Commonly known as: NITROSTAT Place 1 tablet (0.4 mg total) under the tongue every 5 (five) minutes x 3 doses as needed for chest pain (or severe SOB).   nystatin powder Commonly known as: MYCOSTATIN/NYSTOP Apply 1 application topically once as needed (for skin iritation). What changed: Another medication with the same name was added. Make sure you understand how and when to take each. Changed by: Taylor Richters, PA-C   nystatin powder Commonly known as: MYCOSTATIN/NYSTOP Apply 1 Application topically 3 (three) times daily. What changed: You were already taking a medication with the same name, and this prescription was added. Make sure you understand how and when to take each. Changed  by: Taylor Richters, PA-C   potassium chloride SA 20 MEQ tablet Commonly known as: KLOR-CON M Take 1 tablet (20 mEq total) by mouth daily.   traMADol 50 MG tablet Commonly known as: ULTRAM TAKE 1 TABLET (50 MG TOTAL) BY MOUTH EVERY 6 (SIX) HOURS AS NEEDED FOR UP TO 5 DAYS FOR SEVERE PAIN.   vitamin C 250 MG tablet Commonly known as: ASCORBIC ACID Take 250 mg by mouth daily.   zinc gluconate 50 MG tablet Take 50 mg by mouth daily.        Allergies:  Allergies  Allergen Reactions   Codeine Shortness Of Breath and Itching   Epinephrine Shortness Of Breath and Palpitations   Latex Anaphylaxis   Penicillins Anaphylaxis    Did it involve swelling of the face/tongue/throat, SOB, or low BP? Yes Did it involve sudden or severe rash/hives, skin peeling, or any reaction on the inside of your mouth or nose? No Did you need to seek medical attention at a hospital or doctor's office? Yes When did it last happen?      40+ years If all above answers are "NO", may proceed with cephalosporin use.    Prednisone Other (See Comments)    psychosis    Statins Other (See Comments)    Myalgias    Lidocaine Palpitations   Sulfa Antibiotics Rash    Past Medical History, Surgical history, Social history, and Family History were reviewed and updated.  Review of Systems: Review of Systems  Constitutional: Negative.   HENT: Negative.    Eyes: Negative.   Respiratory: Negative.    Cardiovascular: Negative.   Gastrointestinal: Negative.   Genitourinary: Negative.   Musculoskeletal: Negative.   Skin: Negative.   Neurological: Negative.   Endo/Heme/Allergies: Negative.   Psychiatric/Behavioral: Negative.       Physical Exam:  height is 4\' 9"  (1.448 m) and weight is 228 lb (103.4 kg). Her oral temperature is 98.2 F (36.8 C). Her blood pressure is 157/86 (abnormal) and her pulse is 81. Her respiration is 18 and oxygen saturation is 94%.   Wt Readings from Last 3 Encounters:  04/02/22  228 lb (103.4 kg)  04/02/22 227 lb 3.2 oz (103.1 kg)  02/06/22 228 lb 9.6 oz (103.7 kg)    Physical Exam Vitals reviewed.  HENT:     Head: Normocephalic and atraumatic.  Eyes:     Pupils: Pupils are equal, round, and reactive to light.  Cardiovascular:     Rate and Rhythm: Normal rate and regular rhythm.     Heart sounds: Normal heart sounds.  Pulmonary:     Effort: Pulmonary effort is normal.     Breath sounds: Normal breath sounds.  Abdominal:     General: Bowel sounds are normal.     Palpations: Abdomen is soft.  Musculoskeletal:        General: No tenderness or deformity. Normal range of motion.     Cervical back: Normal range of motion.  Lymphadenopathy:     Cervical: No cervical adenopathy.  Skin:    General: Skin is warm and dry.     Findings: No erythema or rash.  Neurological:     Mental Status: She is alert and oriented to person, place, and time.  Psychiatric:        Behavior: Behavior normal.        Thought Content: Thought content normal.        Judgment: Judgment normal.      Lab Results  Component Value Date   WBC 8.2 04/02/2022   HGB 12.2 04/02/2022   HCT 41.7 04/02/2022   MCV 93.1 04/02/2022   PLT 182 04/02/2022   Lab Results  Component Value Date   FERRITIN 155 01/30/2022   IRON 76 01/30/2022   TIBC 371 01/30/2022   UIBC 295 01/30/2022   IRONPCTSAT 21 01/30/2022   Lab Results  Component Value Date   RETICCTPCT 2.9 04/02/2022   RBC 4.48 04/02/2022   RBC 4.48 04/02/2022   No results found for: "KPAFRELGTCHN", "LAMBDASER", "KAPLAMBRATIO" No results found for: "IGGSERUM", "IGA", "IGMSERUM" No results found for: "TOTALPROTELP", "ALBUMINELP", "A1GS", "A2GS", "BETS", "BETA2SER", "GAMS", "MSPIKE", "SPEI"   Chemistry      Component Value Date/Time   NA 138 01/30/2022 0907   NA 143 10/26/2021 1113   K 3.8 01/30/2022 0907   CL 97 (L) 01/30/2022 0907   CO2 32 01/30/2022 0907   BUN 20 01/30/2022 0907   BUN 26 10/26/2021 1113   CREATININE  0.82 01/30/2022 0907   CREATININE 0.81 04/10/2021 1557      Component Value Date/Time   CALCIUM 9.8 01/30/2022 0907   ALKPHOS 133 (H) 01/30/2022 0907   AST 34 01/30/2022 0907   ALT 27 01/30/2022 0907   BILITOT 0.6 01/30/2022 0907       Impression and Plan: Ms. Roseman is a very pleasant 81 yo caucasian female with iron deficiency anemia secondary to intermittent GI blood loss and malabsorption.  I so happy that her hemoglobin is holding steady right now.  Hopefully, this is a good sign that she is not having any GI bleeding.  I know that she has intermittent GI bleeding.  Gastroenterology typically helps manage this.  We will see what her iron studies are.  I would have to believe that iron studies should be okay.  We will plan to get her back in about 2 months.  I want to see her back before she has her 81th birthday.  This is a big event for her.    Josph Macho, MD 7/31/202310:41 AM

## 2022-04-02 NOTE — Telephone Encounter (Signed)
PA approved until 09/02/22

## 2022-04-02 NOTE — Telephone Encounter (Signed)
Pharmacy team please advise on holding Eliquis for patient's upcoming dental extraction procedure with bone grafting on 04/04/2022.  Thank you

## 2022-04-02 NOTE — Patient Instructions (Addendum)
Htn, chf and aortic stenosis- continue current doses of lasix, losartan and eliquis. Clinically stable. Get cmp and bnp today.  Mild high bp- better at home. If you would recheck your bp today and send me update since bp is better at home.  Anemia- pt will follow up with Dr. Myna Hidalgo today and likely get cbc and iron panel. Rx iron today at RN daughter request.   Upcoming dental procedures- hold eliquis per cardiology advise. Also will rx azithromycin 500 mg 30-60 mg prior to procedure.   For elevated blood sugar get a1c today.  Follow up 3-6 months or sooner if needed.

## 2022-04-02 NOTE — Addendum Note (Signed)
Addended by: Elizabeth Palau on: 04/02/2022 03:41 PM   Modules accepted: Orders

## 2022-04-02 NOTE — Progress Notes (Signed)
Subjective:    Patient ID: Taylor Jenkins, female    DOB: 1941/08/20, 81 y.o.   MRN: [id number]  HPI Pt in for follow up.  Pt states legs are better and never did unaboots. I had referred to wound care. Some concern back on last visit with me but now better for months per pt. Just thick skin dry skin per pt.   Pt has anemia and low iron. She is seeing Dr. Myna Hidalgo today and will get lab work.   Congestive heart failure, atrial fibrillation and aortic stenosis. Pt is on lasix..  On oxygen 2 liters.  Eliquis and losartan.  Pt has dental procedure this Wednesday. Pt in past used azithromycin 250 mg 2 tab 30-60 mg prior to surgery.  Also in past rash under breast. Pt uses nystatin powdered,    Review of Systems  Constitutional:  Negative for chills, fatigue and fever.  Respiratory:  Negative for cough, chest tightness, shortness of breath and wheezing.   Cardiovascular:  Negative for chest pain and palpitations.  Gastrointestinal:  Negative for anal bleeding.  Genitourinary:  Negative for dysuria.  Musculoskeletal:  Negative for back pain and gait problem.  Skin:  Negative for rash.  Neurological:  Negative for dizziness, speech difficulty, light-headedness and numbness.  Hematological:  Negative for adenopathy. Does not bruise/bleed easily.  Psychiatric/Behavioral:  Negative for behavioral problems and confusion.     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   CAD (coronary artery disease) 2016   non-obstructive at cath   Chronic diastolic CHF (congestive heart failure) (HCC)    Diabetes mellitus without complication (HCC)    type 2   HLD (hyperlipidemia)    Hypertension    Iron deficiency anemia due to chronic blood loss 11/22/2020   S/P TAVR (transcatheter aortic valve replacement)    Severe  aortic stenosis 04/26/2017     Social History   Socioeconomic History   Marital status: Widowed    Spouse name: Not on file   Number of children: 4   Years of education: Not on file   Highest education level: Not on file  Occupational History   Occupation: Retired-Secretary  Tobacco Use   Smoking status: Never   Smokeless tobacco: Never  Vaping Use   Vaping Use: Never used  Substance and Sexual Activity   Alcohol use: No    Alcohol/week: 0.0 standard drinks of alcohol   Drug use: No   Sexual activity: Not on file  Other Topics Concern   Not on file  Social History Narrative   Pt lives alone in [place].    Social Determinants of Health   Financial Resource Strain: Low Risk  (11/16/2021)   Overall Financial Resource Strain (CARDIA)    Difficulty of Paying Living Expenses: Not hard at all  Food Insecurity: No Food Insecurity (11/16/2021)   Hunger Vital Sign    Worried About Running Out of Food in the Last Year: Never true    Ran Out of Food in the Last Year: Never true  Transportation Needs: No Transportation Needs (11/16/2021)   PRAPARE - Administrator, Civil Service (Medical): No    Lack of Transportation (Non-Medical): No  Physical Activity: Not on file  Stress: No Stress Concern Present (11/16/2021)   [employer] of Occupational Health - Occupational  Stress Questionnaire    Feeling of Stress : Not at all  Social Connections: Moderately Isolated (11/16/2021)   Social Connection and Isolation Panel [NHANES]    Frequency of Communication with Friends and Family: More than three times a week    Frequency of Social Gatherings with Friends and Family: Twice a week    Attends Religious Services: More than 4 times per year    Active Member of [employer] or Organizations: No    Attends Archivist Meetings: Never    Marital Status: Widowed  Intimate Partner Violence: Not At Risk (11/16/2021)   Humiliation, Afraid, Rape, and Kick questionnaire    Fear  of Current or Ex-Partner: No    Emotionally Abused: No    Physically Abused: No    Sexually Abused: No    Past Surgical History:  Procedure Laterality Date   LEFT HEART CATHETERIZATION WITH CORONARY ANGIOGRAM N/A 09/27/2014   Non-obstructive disease, CRF reduction recommended. Procedure: LEFT HEART CATHETERIZATION WITH CORONARY ANGIOGRAM;  Surgeon: Blane Ohara, MD;  Location: [facility] CATH LAB;  Service: Cardiovascular;  Laterality: N/A;   RIGHT/LEFT HEART CATH AND CORONARY ANGIOGRAPHY N/A 10/30/2018   Procedure: RIGHT/LEFT HEART CATH AND CORONARY ANGIOGRAPHY;  Surgeon: Burnell Blanks, MD;  Location: [place] CV LAB;  Service: Cardiovascular;  Laterality: N/A;   TEE WITHOUT CARDIOVERSION N/A 11/18/2018   Procedure: TRANSESOPHAGEAL ECHOCARDIOGRAM (TEE);  Surgeon: Burnell Blanks, MD;  Location: [place] CV LAB;  Service: Open Heart Surgery;  Laterality: N/A;   TRANSCATHETER AORTIC VALVE REPLACEMENT, TRANSFEMORAL N/A 11/18/2018   Procedure: TRANSCATHETER AORTIC VALVE REPLACEMENT, TRANSFEMORAL;  Surgeon: Burnell Blanks, MD;  Location: [place] CV LAB;  Service: Open Heart Surgery;  Laterality: N/A;   TUBAL LIGATION      Family History  Problem Relation Age of Onset   Transient ischemic attack Mother    Heart attack Brother 58    Allergies  Allergen Reactions   Codeine Shortness Of Breath and Itching   Epinephrine Shortness Of Breath and Palpitations   Latex Anaphylaxis   Penicillins Anaphylaxis    Did it involve swelling of the face/tongue/throat, SOB, or low BP? Yes Did it involve sudden or severe rash/hives, skin peeling, or any reaction on the inside of your mouth or nose? No Did you need to seek medical attention at a hospital or doctor's office? Yes When did it last happen?      40+ years If all above answers are "NO", may proceed with cephalosporin use.    Prednisone Other (See Comments)    psychosis    Statins Other (See Comments)     Myalgias    Lidocaine Palpitations   Sulfa Antibiotics Rash    Current Outpatient Medications on File Prior to Visit  Medication Sig Dispense Refill   allopurinol (ZYLOPRIM) 100 MG tablet Take 1 tablet (100 mg total) by mouth daily. 90 tablet 1   apixaban (ELIQUIS) 5 MG TABS tablet Take 1 tablet (5 mg total) by mouth 2 (two) times daily. 60 tablet 6   calcium carbonate (TUMS EX) 750 MG chewable tablet Chew 1 tablet by mouth as needed for heartburn.      furosemide (LASIX) 40 MG tablet Take 1 tablet (40 mg total) by mouth daily. 30 tablet 6   hydrocortisone 2.5 % cream APPLY TO AFFECTED AREA TWICE A DAY 28 g 0   insulin NPH Human (NOVOLIN N) 100 UNIT/ML injection Inject 50 Units into the skin in the morning and at bedtime.  levalbuterol (XOPENEX HFA) 45 MCG/ACT inhaler INHALE 1 TO 2 PUFFS BY MOUTH EVERY 4 HOURS AS NEEDED FOR WHEEZE     losartan (COZAAR) 25 MG tablet Take 1 tablet (25 mg total) by mouth daily. 30 tablet 6   metoprolol tartrate (LOPRESSOR) 25 MG tablet Take 1 tablet (25 mg total) by mouth 2 (two) times daily. 180 tablet 0   Multiple Vitamin (MULTIVITAMIN ADULT PO) Take 1 tablet by mouth daily.     nitroGLYCERIN (NITROSTAT) 0.4 MG SL tablet Place 1 tablet (0.4 mg total) under the tongue every 5 (five) minutes x 3 doses as needed for chest pain (or severe SOB). 25 tablet 2   nystatin (MYCOSTATIN/NYSTOP) powder Apply 1 application topically once as needed (for skin iritation).     potassium chloride (KLOR-CON M) 20 MEQ tablet Take 1 tablet (20 mEq total) by mouth daily. 90 tablet 3   traMADol (ULTRAM) 50 MG tablet TAKE 1 TABLET (50 MG TOTAL) BY MOUTH EVERY 6 (SIX) HOURS AS NEEDED FOR UP TO 5 DAYS FOR SEVERE PAIN. 12 tablet 0   vitamin C (ASCORBIC ACID) 250 MG tablet Take 250 mg by mouth daily.     zinc gluconate 50 MG tablet Take 50 mg by mouth daily.     No current facility-administered medications on file prior to visit.    BP (!) 159/100   Pulse 94   Resp 18   Ht 4'  9" (1.448 m)   Wt 227 lb 3.2 oz (103.1 kg)   SpO2 100%   BMI 49.17 kg/m        Objective:   Physical Exam  General Mental Status- Alert. General Appearance- Not in acute distress.   Skin General: Color- Normal Color. Moisture- Normal Moisture.  Neck Carotid Arteries- Normal color. Moisture- Normal Moisture. No carotid bruits. No JVD.  Chest and Lung Exam Auscultation: Breath Sounds:-Normal.  Cardiovascular Auscultation:Rythm- Regular. Murmurs & Other Heart Sounds:Auscultation of the heart reveals- No Murmurs.  Abdomen Inspection:-Inspeection Normal. Palpation/Percussion:Note:No mass. Palpation and Percussion of the abdomen reveal- Non Tender, Non Distended + BS, no rebound or guarding.    Neurologic Cranial Nerve exam:- CN III-XII intact(No nystagmus), symmetric smile. Strength:- 5/5 equal and symmetric strength both upper and lower extremities.    Lower ext- symmetric calfs. No swelling. Negative homans signs.       Assessment & Plan:   Patient Instructions  Htn, chf and aortic stenosis- continue current doses of lasix, losartan and eliquis. Clinically stable. Get cmp and bnp today.  Mild high bp- better at home. If you would recheck your bp today and send me update since bp is better at home.  Anemia- pt will follow up with Dr. Myna Hidalgo today and likely get cbc and iron panel. Rx iron today at RN daughter request.   Upcoming dental procedures- hold eliquis per cardiology advise. Also will rx azithromycin 500 mg 30-60 mg prior to procedure.   For elevated blood sugar get a1c today.  Follow up 3-6 months or sooner if needed.        Esperanza Richters, PA-C

## 2022-04-02 NOTE — Telephone Encounter (Signed)
Taylor Jenkins (KeyAdah Perl) started for Nystatin

## 2022-04-02 NOTE — Telephone Encounter (Signed)
   Pre-operative Risk Assessment    Patient Name: Taylor Jenkins  DOB: 06-Dec-1940 MRN: [id number]     Request for Surgical Clearance    Procedure:  Dental Extraction - Amount of Teeth to be Pulled:  5  along with bone grafting  Date of Surgery:  Clearance 04/04/22                                 Surgeon:  Dr.Fioravanti Surgeon's Group or Practice Name:  [facility] Phone number:  [phone] Fax number:  [phone]   Type of Clearance Requested:   - Medical  - Pharmacy:  Hold Apixaban (Eliquis)     Type of Anesthesia:  Local    Additional requests/questions:      SignedFilomena Jungling   04/02/2022, 1:15 PM

## 2022-04-04 DIAGNOSIS — R69 Illness, unspecified: Secondary | ICD-10-CM | POA: Diagnosis not present

## 2022-05-12 ENCOUNTER — Other Ambulatory Visit: Payer: Self-pay | Admitting: Internal Medicine

## 2022-05-12 ENCOUNTER — Other Ambulatory Visit: Payer: Self-pay | Admitting: Medical

## 2022-05-29 ENCOUNTER — Other Ambulatory Visit: Payer: Self-pay | Admitting: Medical

## 2022-06-01 ENCOUNTER — Telehealth: Payer: Self-pay | Admitting: Medical

## 2022-06-01 ENCOUNTER — Inpatient Hospital Stay (HOSPITAL_BASED_OUTPATIENT_CLINIC_OR_DEPARTMENT_OTHER): Payer: Medicare HMO | Admitting: Family

## 2022-06-01 ENCOUNTER — Inpatient Hospital Stay: Payer: Medicare HMO | Attending: Hematology & Oncology

## 2022-06-01 ENCOUNTER — Telehealth: Payer: Self-pay

## 2022-06-01 ENCOUNTER — Encounter: Payer: Self-pay | Admitting: Family

## 2022-06-01 VITALS — BP 142/63 | HR 73 | Temp 98.5°F | Resp 17

## 2022-06-01 DIAGNOSIS — D649 Anemia, unspecified: Secondary | ICD-10-CM | POA: Diagnosis not present

## 2022-06-01 DIAGNOSIS — K31811 Angiodysplasia of stomach and duodenum with bleeding: Secondary | ICD-10-CM

## 2022-06-01 DIAGNOSIS — D508 Other iron deficiency anemias: Secondary | ICD-10-CM | POA: Insufficient documentation

## 2022-06-01 DIAGNOSIS — K909 Intestinal malabsorption, unspecified: Secondary | ICD-10-CM | POA: Insufficient documentation

## 2022-06-01 DIAGNOSIS — Z7901 Long term (current) use of anticoagulants: Secondary | ICD-10-CM | POA: Insufficient documentation

## 2022-06-01 DIAGNOSIS — K922 Gastrointestinal hemorrhage, unspecified: Secondary | ICD-10-CM | POA: Diagnosis not present

## 2022-06-01 DIAGNOSIS — D500 Iron deficiency anemia secondary to blood loss (chronic): Secondary | ICD-10-CM | POA: Diagnosis not present

## 2022-06-01 LAB — CBC WITH DIFFERENTIAL (CANCER CENTER ONLY)
Abs Immature Granulocytes: 0.13 10*3/uL — ABNORMAL HIGH (ref 0.00–0.07)
Basophils Absolute: 0.1 10*3/uL (ref 0.0–0.1)
Basophils Relative: 1 %
Eosinophils Absolute: 0.2 10*3/uL (ref 0.0–0.5)
Eosinophils Relative: 2 %
HCT: 43.1 % (ref 36.0–46.0)
Hemoglobin: 12.5 g/dL (ref 12.0–15.0)
Immature Granulocytes: 1 %
Lymphocytes Relative: 9 %
Lymphs Abs: 0.9 10*3/uL (ref 0.7–4.0)
MCH: 27 pg (ref 26.0–34.0)
MCHC: 29 g/dL — ABNORMAL LOW (ref 30.0–36.0)
MCV: 93.1 fL (ref 80.0–100.0)
Monocytes Absolute: 0.7 10*3/uL (ref 0.1–1.0)
Monocytes Relative: 7 %
Neutro Abs: 7.3 10*3/uL (ref 1.7–7.7)
Neutrophils Relative %: 80 %
Platelet Count: 198 10*3/uL (ref 150–400)
RBC: 4.63 MIL/uL (ref 3.87–5.11)
RDW: 17.6 % — ABNORMAL HIGH (ref 11.5–15.5)
WBC Count: 9.3 10*3/uL (ref 4.0–10.5)
nRBC: 0 % (ref 0.0–0.2)

## 2022-06-01 LAB — IRON AND IRON BINDING CAPACITY (CC-WL,HP ONLY)
Iron: 38 ug/dL (ref 28–170)
Saturation Ratios: 9 % — ABNORMAL LOW (ref 10.4–31.8)
TIBC: 428 ug/dL (ref 250–450)
UIBC: 390 ug/dL (ref 148–442)

## 2022-06-01 LAB — CMP (CANCER CENTER ONLY)
ALT: 39 U/L (ref 0–44)
AST: 57 U/L — ABNORMAL HIGH (ref 15–41)
Albumin: 4 g/dL (ref 3.5–5.0)
Alkaline Phosphatase: 128 U/L — ABNORMAL HIGH (ref 38–126)
Anion gap: 8 (ref 5–15)
BUN: 30 mg/dL — ABNORMAL HIGH (ref 8–23)
CO2: 32 mmol/L (ref 22–32)
Calcium: 9.6 mg/dL (ref 8.9–10.3)
Chloride: 103 mmol/L (ref 98–111)
Creatinine: 1.09 mg/dL — ABNORMAL HIGH (ref 0.44–1.00)
GFR, Estimated: 52 mL/min — ABNORMAL LOW (ref >60)
Glucose, Bld: 154 mg/dL — ABNORMAL HIGH (ref 70–99)
Potassium: 4.1 mmol/L (ref 3.5–5.1)
Sodium: 143 mmol/L (ref 135–145)
Total Bilirubin: 0.6 mg/dL (ref 0.3–1.2)
Total Protein: 7.7 g/dL (ref 6.5–8.1)

## 2022-06-01 LAB — RETICULOCYTES
Immature Retic Fract: 14.2 % (ref 2.3–15.9)
RBC.: 4.6 MIL/uL (ref 3.87–5.11)
Retic Count, Absolute: 90.2 10*3/uL (ref 19.0–186.0)
Retic Ct Pct: 2 % (ref 0.4–3.1)

## 2022-06-01 LAB — FERRITIN: Ferritin: 64 ng/mL (ref 11–307)

## 2022-06-01 NOTE — Telephone Encounter (Signed)
-----   Message from Volanda Napoleon, MD sent at 06/01/2022  1:31 PM EDT ----- Call and let her know that the iron is very low.  We need to give her IV iron.  Please set this up.  Laurey Arrow

## 2022-06-01 NOTE — Telephone Encounter (Signed)
Hematologist office sent note and had question regarding treatment for bodyaches. Let pt know can follow up with me regarding joint pains and myalgias.

## 2022-06-01 NOTE — Progress Notes (Signed)
Hematology and Oncology Follow Up Visit  Taylor Jenkins [id number] 11-12-1940 81 y.o. 06/01/2022   Principle Diagnosis:  Iron deficiency anemia --patient on Eliquis/malabsorption   Current Therapy:        IV iron as indicated -Venofer given on 01/10/2022 Transfusional support as needed   Interim History:  Ms. Taylor Jenkins is here today for follow-up. She is having a hard time with generalized joint aches and pains. This has effected her mobility and she stays in a chair most of the day. Her daughters are taking turns helping her with her home and ADLs.   She has not noted any blood loss. No bruising or petechiae.  She has fatigue and weakness. She also has SOB and is on 2-3 L [place] supplemental O2.  No fever, chills, n/v, cough, rash, dizziness, chest pain, abdominal pain or changes in bowel or bladder habits.  The chronic swelling in her lower extremities is unchanged.  No numbness or tingling in her extremities at this time.  No falls or syncope to report.  Appetite and hydration are good. She was unable to stand today for weight.   ECOG Performance Status: 2 - Symptomatic, <50% confined to bed  Medications:  Allergies as of 06/01/2022       Reactions   Codeine Shortness Of Breath, Itching   Epinephrine Shortness Of Breath, Palpitations   Latex Anaphylaxis   Penicillins Anaphylaxis   Did it involve swelling of the face/tongue/throat, SOB, or low BP? Yes Did it involve sudden or severe rash/hives, skin peeling, or any reaction on the inside of your mouth or nose? No Did you need to seek medical attention at a hospital or doctor's office? Yes When did it last happen?      40+ years If all above answers are "NO", may proceed with cephalosporin use.   Prednisone Other (See Comments)   psychosis    Statins Other (See Comments)   Myalgias   Lidocaine Palpitations   Sulfa Antibiotics Rash        Medication List        Accurate as of June 01, 2022 11:28 AM. If you have any  questions, ask your nurse or doctor.          allopurinol 100 MG tablet Commonly known as: ZYLOPRIM Take 1 tablet (100 mg total) by mouth daily.   apixaban 5 MG Tabs tablet Commonly known as: Eliquis Take 1 tablet (5 mg total) by mouth 2 (two) times daily.   azithromycin 500 MG tablet Commonly known as: Zithromax Take 1 tablet (500 mg total) by mouth daily. What changed:  when to take this additional instructions   calcium carbonate 750 MG chewable tablet Commonly known as: TUMS EX Chew 1 tablet by mouth as needed for heartburn.   ferrous sulfate 325 (65 FE) MG EC tablet TAKE 1 TABLET BY MOUTH DAILY   furosemide 40 MG tablet Commonly known as: LASIX Take 1 tablet (40 mg total) by mouth daily. What changed: how much to take   hydrocortisone 2.5 % cream APPLY TO AFFECTED AREA TWICE A DAY   insulin NPH Human 100 UNIT/ML injection Commonly known as: NOVOLIN N Inject 50 Units into the skin in the morning and at bedtime.   levalbuterol 45 MCG/ACT inhaler Commonly known as: XOPENEX HFA INHALE 1 TO 2 PUFFS BY MOUTH EVERY 4 HOURS AS NEEDED FOR WHEEZE   losartan 25 MG tablet Commonly known as: COZAAR Take 1 tablet (25 mg total) by mouth daily.   metoprolol  tartrate 25 MG tablet Commonly known as: LOPRESSOR Take 1 tablet (25 mg total) by mouth 2 (two) times daily.   MULTIVITAMIN ADULT PO Take 1 tablet by mouth daily.   nitroGLYCERIN 0.4 MG SL tablet Commonly known as: NITROSTAT Place 1 tablet (0.4 mg total) under the tongue every 5 (five) minutes x 3 doses as needed for chest pain (or severe SOB).   nystatin powder Commonly known as: MYCOSTATIN/NYSTOP Apply 1 application topically once as needed (for skin iritation).   nystatin powder Commonly known as: MYCOSTATIN/NYSTOP Apply 1 Application topically 3 (three) times daily.   potassium chloride 10 MEQ CR capsule Commonly known as: MICRO-K TAKE 1 CAPSULE BY MOUTH EVERY DAY   potassium chloride SA 20 MEQ  tablet Commonly known as: KLOR-CON M Take 1 tablet (20 mEq total) by mouth daily.   traMADol 50 MG tablet Commonly known as: ULTRAM TAKE 1 TABLET (50 MG TOTAL) BY MOUTH EVERY 6 (SIX) HOURS AS NEEDED FOR UP TO 5 DAYS FOR SEVERE PAIN.   vitamin C 250 MG tablet Commonly known as: ASCORBIC ACID Take 250 mg by mouth daily.   zinc gluconate 50 MG tablet Take 50 mg by mouth daily.        Allergies:  Allergies  Allergen Reactions   Codeine Shortness Of Breath and Itching   Epinephrine Shortness Of Breath and Palpitations   Latex Anaphylaxis   Penicillins Anaphylaxis    Did it involve swelling of the face/tongue/throat, SOB, or low BP? Yes Did it involve sudden or severe rash/hives, skin peeling, or any reaction on the inside of your mouth or nose? No Did you need to seek medical attention at a hospital or doctor's office? Yes When did it last happen?      40+ years If all above answers are "NO", may proceed with cephalosporin use.    Prednisone Other (See Comments)    psychosis    Statins Other (See Comments)    Myalgias    Lidocaine Palpitations   Sulfa Antibiotics Rash    Past Medical History, Surgical history, Social history, and Family History were reviewed and updated.  Review of Systems: All other 10 point review of systems is negative.   Physical Exam:  oral temperature is 98.5 F (36.9 C). Her blood pressure is 142/63 (abnormal) and her pulse is 73. Her respiration is 17 and oxygen saturation is 97%.   Wt Readings from Last 3 Encounters:  04/02/22 228 lb (103.4 kg)  04/02/22 227 lb 3.2 oz (103.1 kg)  02/06/22 228 lb 9.6 oz (103.7 kg)    Ocular: Sclerae unicteric, pupils equal, round and reactive to light Ear-nose-throat: Oropharynx clear, dentition fair Lymphatic: No cervical or supraclavicular adenopathy Lungs no rales or rhonchi, good excursion bilaterally Heart regular rate and rhythm, no murmur appreciated Abd soft, nontender, positive bowel  sounds MSK no focal spinal tenderness, no joint edema Neuro: non-focal, well-oriented, appropriate affect Breasts: Deferred   Lab Results  Component Value Date   WBC 9.3 06/01/2022   HGB 12.5 06/01/2022   HCT 43.1 06/01/2022   MCV 93.1 06/01/2022   PLT 198 06/01/2022   Lab Results  Component Value Date   FERRITIN 79 04/02/2022   IRON 61 04/02/2022   TIBC 388 04/02/2022   UIBC 327 04/02/2022   IRONPCTSAT 16 04/02/2022   Lab Results  Component Value Date   RETICCTPCT 2.0 06/01/2022   RBC 4.60 06/01/2022   RBC 4.63 06/01/2022   No results found for: "KPAFRELGTCHN", "LAMBDASER", "KAPLAMBRATIO" No  results found for: "IGGSERUM", "IGA", "IGMSERUM" No results found for: "TOTALPROTELP", "ALBUMINELP", "A1GS", "A2GS", "BETS", "BETA2SER", "GAMS", "MSPIKE", "SPEI"   Chemistry      Component Value Date/Time   NA 143 06/01/2022 1003   NA 143 10/26/2021 1113   K 4.1 06/01/2022 1003   CL 103 06/01/2022 1003   CO2 32 06/01/2022 1003   BUN 30 (H) 06/01/2022 1003   BUN 26 10/26/2021 1113   CREATININE 1.09 (H) 06/01/2022 1003   CREATININE 0.81 04/10/2021 1557      Component Value Date/Time   CALCIUM 9.6 06/01/2022 1003   ALKPHOS 128 (H) 06/01/2022 1003   AST 57 (H) 06/01/2022 1003   ALT 39 06/01/2022 1003   BILITOT 0.6 06/01/2022 1003       Impression and Plan: Ms. Riggenbach is a very pleasant 81 yo caucasian female with iron deficiency anemia secondary to intermittent GI blood loss and malabsorption.  Iron studies are pending. We will replace if needed.  Hgb stable at 12.5, MCV 93.  Follow-up in 3 months.   Lottie Dawson, NP 9/29/202311:28 AM

## 2022-06-04 NOTE — Telephone Encounter (Signed)
Pt called & lvm to return call 

## 2022-06-06 ENCOUNTER — Inpatient Hospital Stay: Payer: Medicare HMO | Attending: Hematology & Oncology

## 2022-06-06 VITALS — BP 134/76 | HR 72 | Temp 98.4°F | Resp 20

## 2022-06-06 DIAGNOSIS — D508 Other iron deficiency anemias: Secondary | ICD-10-CM | POA: Insufficient documentation

## 2022-06-06 DIAGNOSIS — K922 Gastrointestinal hemorrhage, unspecified: Secondary | ICD-10-CM | POA: Insufficient documentation

## 2022-06-06 DIAGNOSIS — Z7901 Long term (current) use of anticoagulants: Secondary | ICD-10-CM | POA: Diagnosis not present

## 2022-06-06 DIAGNOSIS — K909 Intestinal malabsorption, unspecified: Secondary | ICD-10-CM | POA: Insufficient documentation

## 2022-06-06 DIAGNOSIS — K31811 Angiodysplasia of stomach and duodenum with bleeding: Secondary | ICD-10-CM

## 2022-06-06 DIAGNOSIS — D500 Iron deficiency anemia secondary to blood loss (chronic): Secondary | ICD-10-CM | POA: Diagnosis not present

## 2022-06-06 MED ORDER — SODIUM CHLORIDE 0.9 % IV SOLN
300.0000 mg | Freq: Once | INTRAVENOUS | Status: AC
  Administered 2022-06-06: 300 mg via INTRAVENOUS
  Filled 2022-06-06: qty 300

## 2022-06-06 MED ORDER — SODIUM CHLORIDE 0.9 % IV SOLN: Freq: Once | INTRAVENOUS | Status: AC

## 2022-06-06 NOTE — Progress Notes (Signed)
Pt declined to stay for post infusion observation period. Pt stated she has tolerated medication multiple times prior without difficulty. Pt aware to call clinic with any questions or concerns. Pt verbalized understanding and had no further questions.  ? ?

## 2022-06-06 NOTE — Patient Instructions (Signed)

## 2022-06-11 ENCOUNTER — Inpatient Hospital Stay: Payer: Medicare HMO

## 2022-06-11 VITALS — BP 158/72 | HR 58 | Temp 98.5°F | Resp 17

## 2022-06-11 DIAGNOSIS — K909 Intestinal malabsorption, unspecified: Secondary | ICD-10-CM | POA: Diagnosis not present

## 2022-06-11 DIAGNOSIS — K922 Gastrointestinal hemorrhage, unspecified: Secondary | ICD-10-CM | POA: Diagnosis not present

## 2022-06-11 DIAGNOSIS — K31811 Angiodysplasia of stomach and duodenum with bleeding: Secondary | ICD-10-CM

## 2022-06-11 DIAGNOSIS — D508 Other iron deficiency anemias: Secondary | ICD-10-CM | POA: Diagnosis not present

## 2022-06-11 DIAGNOSIS — Z7901 Long term (current) use of anticoagulants: Secondary | ICD-10-CM | POA: Diagnosis not present

## 2022-06-11 DIAGNOSIS — D500 Iron deficiency anemia secondary to blood loss (chronic): Secondary | ICD-10-CM | POA: Diagnosis not present

## 2022-06-11 MED ORDER — SODIUM CHLORIDE 0.9 % IV SOLN
300.0000 mg | Freq: Once | INTRAVENOUS | Status: AC
  Administered 2022-06-11: 300 mg via INTRAVENOUS
  Filled 2022-06-11: qty 300

## 2022-06-11 MED ORDER — SODIUM CHLORIDE 0.9 % IV SOLN: Freq: Once | INTRAVENOUS | Status: AC

## 2022-06-11 NOTE — Patient Instructions (Signed)

## 2022-06-11 NOTE — Progress Notes (Signed)
Pt declined to stay for post infusion observation period. Pt stated she has tolerated medication multiple times prior without difficulty. Pt aware to call clinic with any questions or concerns. Pt verbalized understanding and had no further questions.  ? ?

## 2022-06-18 ENCOUNTER — Inpatient Hospital Stay: Payer: Medicare HMO

## 2022-06-18 VITALS — BP 135/78 | HR 57 | Temp 98.0°F | Resp 17

## 2022-06-18 DIAGNOSIS — Z7901 Long term (current) use of anticoagulants: Secondary | ICD-10-CM | POA: Diagnosis not present

## 2022-06-18 DIAGNOSIS — D500 Iron deficiency anemia secondary to blood loss (chronic): Secondary | ICD-10-CM | POA: Diagnosis not present

## 2022-06-18 DIAGNOSIS — K922 Gastrointestinal hemorrhage, unspecified: Secondary | ICD-10-CM | POA: Diagnosis not present

## 2022-06-18 DIAGNOSIS — K31811 Angiodysplasia of stomach and duodenum with bleeding: Secondary | ICD-10-CM

## 2022-06-18 DIAGNOSIS — D508 Other iron deficiency anemias: Secondary | ICD-10-CM | POA: Diagnosis not present

## 2022-06-18 DIAGNOSIS — K909 Intestinal malabsorption, unspecified: Secondary | ICD-10-CM | POA: Diagnosis not present

## 2022-06-18 MED ORDER — SODIUM CHLORIDE 0.9 % IV SOLN
300.0000 mg | Freq: Once | INTRAVENOUS | Status: AC
  Administered 2022-06-18: 300 mg via INTRAVENOUS
  Filled 2022-06-18: qty 300

## 2022-06-18 MED ORDER — SODIUM CHLORIDE 0.9 % IV SOLN: Freq: Once | INTRAVENOUS | Status: AC

## 2022-06-18 NOTE — Patient Instructions (Signed)

## 2022-06-19 ENCOUNTER — Ambulatory Visit (INDEPENDENT_AMBULATORY_CARE_PROVIDER_SITE_OTHER): Payer: Medicare HMO | Admitting: Medical

## 2022-06-19 ENCOUNTER — Encounter: Payer: Self-pay | Admitting: Medical

## 2022-06-19 ENCOUNTER — Ambulatory Visit (HOSPITAL_BASED_OUTPATIENT_CLINIC_OR_DEPARTMENT_OTHER)
Admission: RE | Admit: 2022-06-19 | Discharge: 2022-06-19 | Disposition: A | Discharge status: Home / Self Care | Payer: Medicare HMO | Source: Ambulatory Visit | Attending: Medical | Admitting: Medical

## 2022-06-19 VITALS — BP 132/78 | HR 82 | Temp 98.4°F | Resp 18 | Ht <= 58 in | Wt 233.6 lb

## 2022-06-19 DIAGNOSIS — R059 Cough, unspecified: Secondary | ICD-10-CM | POA: Diagnosis not present

## 2022-06-19 DIAGNOSIS — R0600 Dyspnea, unspecified: Secondary | ICD-10-CM

## 2022-06-19 DIAGNOSIS — J90 Pleural effusion, not elsewhere classified: Secondary | ICD-10-CM | POA: Diagnosis not present

## 2022-06-19 DIAGNOSIS — J811 Chronic pulmonary edema: Secondary | ICD-10-CM | POA: Diagnosis not present

## 2022-06-19 DIAGNOSIS — I4819 Other persistent atrial fibrillation: Secondary | ICD-10-CM

## 2022-06-19 DIAGNOSIS — I509 Heart failure, unspecified: Secondary | ICD-10-CM | POA: Diagnosis not present

## 2022-06-19 LAB — CBC WITH DIFFERENTIAL/PLATELET
Basophils Absolute: 0 10*3/uL (ref 0.0–0.1)
Basophils Relative: 0.6 % (ref 0.0–3.0)
Eosinophils Absolute: 0.2 10*3/uL (ref 0.0–0.7)
Eosinophils Relative: 2.3 % (ref 0.0–5.0)
HCT: 42.5 % (ref 36.0–46.0)
Hemoglobin: 13.3 g/dL (ref 12.0–15.0)
Lymphocytes Relative: 10.5 % — ABNORMAL LOW (ref 12.0–46.0)
Lymphs Abs: 0.8 10*3/uL (ref 0.7–4.0)
MCHC: 31.4 g/dL (ref 30.0–36.0)
MCV: 89.2 fl (ref 78.0–100.0)
Monocytes Absolute: 0.6 10*3/uL (ref 0.1–1.0)
Monocytes Relative: 8.2 % (ref 3.0–12.0)
Neutro Abs: 6.1 10*3/uL (ref 1.4–7.7)
Neutrophils Relative %: 78.4 % — ABNORMAL HIGH (ref 43.0–77.0)
Platelets: 146 10*3/uL — ABNORMAL LOW (ref 150.0–400.0)
RBC: 4.76 Mil/uL (ref 3.87–5.11)
RDW: 20.1 % — ABNORMAL HIGH (ref 11.5–15.5)
WBC: 7.8 10*3/uL (ref 4.0–10.5)

## 2022-06-19 LAB — COMPREHENSIVE METABOLIC PANEL
ALT: 30 U/L (ref 0–35)
AST: 33 U/L (ref 0–37)
Albumin: 3.9 g/dL (ref 3.5–5.2)
Alkaline Phosphatase: 126 U/L — ABNORMAL HIGH (ref 39–117)
BUN: 19 mg/dL (ref 6–23)
CO2: 35 mEq/L — ABNORMAL HIGH (ref 19–32)
Calcium: 9.2 mg/dL (ref 8.4–10.5)
Chloride: 101 mEq/L (ref 96–112)
Creatinine, Ser: 0.86 mg/dL (ref 0.40–1.20)
GFR: 63.98 mL/min (ref >60.00)
Glucose, Bld: 162 mg/dL — ABNORMAL HIGH (ref 70–99)
Potassium: 4.1 mEq/L (ref 3.5–5.1)
Sodium: 141 mEq/L (ref 135–145)
Total Bilirubin: 0.6 mg/dL (ref 0.2–1.2)
Total Protein: 7.5 g/dL (ref 6.0–8.3)

## 2022-06-19 NOTE — Patient Instructions (Addendum)
Recent 10 pound weight gain over the past 7 to 10 days.  History of CHF and permanent atrial fibrillation.  Rate controlled today.  O2 sat 93 to 94% on 2 to 3 L O2.  This is baseline.  Calf symmetric with no significant pedal edema.  Negative Homans' sign.   We will get stat CBC, CMP, BNP and troponin.  If any significant overload/CHF flare type findings would likely recommend emergency department evaluation.  If troponin is to any degree elevated would recommend emergency department as well.  If above work-up is negative/nonrevealing and dyspnea persist then would also recommend ED evaluation for lower extremity ultrasounds and possible more in-depth imaging studies of chest.  Follow-up date to be determined after lab review.

## 2022-06-19 NOTE — Progress Notes (Addendum)
Subjective:    Patient ID: Taylor Jenkins, female    DOB: [birth year], 81 y.o.   MRN: [id number]  HPI Pt in reporting she feels that may have either pneumonia or chf flare. She feels like for 10 days or little less.   Pt not having chest pain.  No fever, no chills or sweats. Then pt states estimates subject feeling like temp 99-100.  Her weight about 7-10 pounds over past week. Pt on 2-3 L daily.  Pt tells me on initial enty in room this is not MI. Daughter is Engineer, civil (consulting).  Pt baseline 02 sat is 93-94% with oxygen.  Left ventricular ejection fraction, by estimation, is 55 to 60%. The left ventricle has normal function. The left ventricle has no regional wall motion abnormalities. The left ventricular internal cavity size was mildly dilated. Left ventricular diastolic function could not be evaluated. 1. The right ventricle is poorly   Pt does have lasix that she used 60 mg daily. Also on potassium   Review of Systems  Constitutional:  Negative for chills, fatigue and fever.  Respiratory:  Positive for cough and shortness of breath. Negative for chest tightness and wheezing.        Occasional mild cough.  Cardiovascular:  Negative for chest pain and palpitations.  Gastrointestinal:  Negative for abdominal pain and anal bleeding.  Genitourinary:  Negative for dyspareunia, dysuria, enuresis and frequency.  Musculoskeletal:  Negative for gait problem.    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   CAD (coronary artery disease) 2016   non-obstructive at cath   Chronic diastolic CHF (congestive heart failure) (HCC)    Diabetes mellitus without complication (HCC)    type 2   HLD (hyperlipidemia)    Hypertension    Iron deficiency anemia due to chronic blood loss 11/22/2020   S/P TAVR (transcatheter  aortic valve replacement)    Severe aortic stenosis 04/26/2017     Social History   Socioeconomic History   Marital status: Widowed    Spouse name: Not on file   Number of children: 4   Years of education: Not on file   Highest education level: Not on file  Occupational History   Occupation: Retired-Secretary  Tobacco Use   Smoking status: Never   Smokeless tobacco: Never  Vaping Use   Vaping Use: Never used  Substance and Sexual Activity   Alcohol use: No    Alcohol/week: 0.0 standard drinks of alcohol   Drug use: No   Sexual activity: Not on file  Other Topics Concern   Not on file  Social History Narrative   Pt lives alone in [place].    Social Determinants of Health   Financial Resource Strain: Low Risk  (11/16/2021)   Overall Financial Resource Strain (CARDIA)    Difficulty of Paying Living Expenses: Not hard at all  Food Insecurity: No Food Insecurity (11/16/2021)   Hunger Vital Sign    Worried About Running Out of Food in the Last Year: Never true    Ran Out of Food in the Last Year: Never true  Transportation Needs: No Transportation Needs (11/16/2021)   PRAPARE - Administrator, Civil Service (Medical): No    Lack of Transportation (Non-Medical): No  Physical Activity: Not on file  Stress: No Stress Concern Present (11/16/2021)  [employer] - Occupational Stress Questionnaire    Feeling of Stress : Not at all  Social Connections: Moderately Isolated (11/16/2021)   Social Connection and Isolation Panel [NHANES]    Frequency of Communication with Friends and Family: More than three times a week    Frequency of Social Gatherings with Friends and Family: Twice a week    Attends Religious Services: More than 4 times per year    Active Member of [employer] or Organizations: No    Attends Archivist Meetings: Never    Marital Status: Widowed  Intimate Partner Violence: Not At Risk (11/16/2021)   Humiliation, Afraid,  Rape, and Kick questionnaire    Fear of Current or Ex-Partner: No    Emotionally Abused: No    Physically Abused: No    Sexually Abused: No    Past Surgical History:  Procedure Laterality Date   LEFT HEART CATHETERIZATION WITH CORONARY ANGIOGRAM N/A 09/27/2014   Non-obstructive disease, CRF reduction recommended. Procedure: LEFT HEART CATHETERIZATION WITH CORONARY ANGIOGRAM;  Surgeon: Blane Ohara, MD;  Location: [facility] CATH [facility];  Service: Cardiovascular;  Laterality: N/A;   RIGHT/LEFT HEART CATH AND CORONARY ANGIOGRAPHY N/A 10/30/2018   Procedure: RIGHT/LEFT HEART CATH AND CORONARY ANGIOGRAPHY;  Surgeon: Burnell Blanks, MD;  Location: [facility];  Service: Cardiovascular;  Laterality: N/A;   TEE WITHOUT CARDIOVERSION N/A 11/18/2018   Procedure: TRANSESOPHAGEAL ECHOCARDIOGRAM (TEE);  Surgeon: Burnell Blanks, MD;  Location: [place] [facility];  Service: Open Heart Surgery;  Laterality: N/A;   TRANSCATHETER AORTIC VALVE REPLACEMENT, TRANSFEMORAL N/A 11/18/2018   Procedure: TRANSCATHETER AORTIC VALVE REPLACEMENT, TRANSFEMORAL;  Surgeon: Burnell Blanks, MD;  Location: [place] [facility];  Service: Open Heart Surgery;  Laterality: N/A;   TUBAL LIGATION      Family History  Problem Relation Age of Onset   Transient ischemic attack Mother    Heart attack Brother 62    Allergies  Allergen Reactions   Codeine Shortness Of Breath and Itching   Epinephrine Shortness Of Breath and Palpitations   Latex Anaphylaxis   Penicillins Anaphylaxis    Did it involve swelling of the face/tongue/throat, SOB, or low BP? Yes Did it involve sudden or severe rash/hives, skin peeling, or any reaction on the inside of your mouth or nose? No Did you need to seek medical attention at a hospital or doctor's office? Yes When did it last happen?      40+ years If all above answers are "NO", may proceed with cephalosporin use.    Prednisone Other (See Comments)    psychosis     Statins Other (See Comments)    Myalgias    Lidocaine Palpitations   Sulfa Antibiotics Rash    Current Outpatient Medications on File Prior to Visit  Medication Sig Dispense Refill   allopurinol (ZYLOPRIM) 100 MG tablet Take 1 tablet (100 mg total) by mouth daily. 90 tablet 1   apixaban (ELIQUIS) 5 MG TABS tablet Take 1 tablet (5 mg total) by mouth 2 (two) times daily. 60 tablet 6   calcium carbonate (TUMS EX) 750 MG chewable tablet Chew 1 tablet by mouth as needed for heartburn.      ferrous sulfate 325 (65 FE) MG EC tablet TAKE 1 TABLET BY MOUTH DAILY 90 tablet 0   furosemide (LASIX) 40 MG tablet Take 1 tablet (40 mg total) by mouth daily. (Patient taking differently: Take 60 mg by mouth daily.) 30 tablet 6   hydrocortisone 2.5 %  cream APPLY TO AFFECTED AREA TWICE A DAY 28 g 0   insulin NPH Human (NOVOLIN N) 100 UNIT/ML injection Inject 50 Units into the skin in the morning and at bedtime.      levalbuterol (XOPENEX HFA) 45 MCG/ACT inhaler INHALE 1 TO 2 PUFFS BY MOUTH EVERY 4 HOURS AS NEEDED FOR WHEEZE 1 each 2   losartan (COZAAR) 25 MG tablet Take 1 tablet (25 mg total) by mouth daily. 30 tablet 6   metoprolol tartrate (LOPRESSOR) 25 MG tablet Take 1 tablet (25 mg total) by mouth 2 (two) times daily. 180 tablet 0   Multiple Vitamin (MULTIVITAMIN ADULT PO) Take 1 tablet by mouth daily.     nitroGLYCERIN (NITROSTAT) 0.4 MG SL tablet Place 1 tablet (0.4 mg total) under the tongue every 5 (five) minutes x 3 doses as needed for chest pain (or severe SOB). 25 tablet 2   nystatin (MYCOSTATIN/NYSTOP) powder Apply 1 application topically once as needed (for skin iritation).     nystatin (MYCOSTATIN/NYSTOP) powder Apply 1 Application topically 3 (three) times daily. 60 g 1   potassium chloride (KLOR-CON M) 20 MEQ tablet Take 1 tablet (20 mEq total) by mouth daily. 90 tablet 3   potassium chloride (MICRO-K) 10 MEQ CR capsule TAKE 1 CAPSULE BY MOUTH EVERY DAY 90 capsule 2   traMADol (ULTRAM) 50 MG  tablet TAKE 1 TABLET (50 MG TOTAL) BY MOUTH EVERY 6 (SIX) HOURS AS NEEDED FOR UP TO 5 DAYS FOR SEVERE PAIN. 12 tablet 0   vitamin C (ASCORBIC ACID) 250 MG tablet Take 250 mg by mouth daily.     zinc gluconate 50 MG tablet Take 50 mg by mouth daily.     No current facility-administered medications on file prior to visit.    BP (!) 162/90   Pulse 82   Temp 98.4 F (36.9 C)   Resp 18   Ht 4\' 9"  (1.448 m)   Wt 233 lb 9.6 oz (106 kg)   SpO2 93%   BMI 50.55 kg/m   Bp 132/78 second check.     Objective:   Physical Exam  General- No acute distress. Pleasant patient. Neck- Full range of motion, no jvd Lungs- Clear, even and unlabored. Heart- regular, irregular and rate controlled. Neurologic- CNII- XII grossly intact.  Lower ext- calfs symmetric, negative homans sign. I don't appreciate any edema beyond mild 1+ bilataterally. No redness.     Assessment & Plan:  [phone].   Patient Instructions  Recent 10 pound weight gain over the past 7 to 10 days.  History of CHF and permanent atrial fibrillation.  Rate controlled today.  O2 sat 93 to 94% on 2 to 3 L O2.  This is baseline.  Calf symmetric with no significant pedal edema.  Negative Homans' sign.  We will get stat CBC, CMP, BNP and troponin.  If any significant overload/CHF flare type findings would likely recommend emergency department evaluation.  If troponin is to any degree elevated would recommend emergency department as well.  If above work-up is negative/nonrevealing and dyspnea persist then would also recommend ED evaluation for lower extremity ultrasounds and possible more in-depth imaging studies of chest.  Follow-up date to be determined after lab review.   Mackie Pai, PA-C

## 2022-06-19 NOTE — Addendum Note (Signed)
Addended by: Anabel Halon on: 06/19/2022 09:07 PM   Modules accepted: Orders

## 2022-06-21 ENCOUNTER — Telehealth: Payer: Self-pay | Admitting: Medical

## 2022-06-21 ENCOUNTER — Telehealth: Payer: Self-pay | Admitting: *Deleted

## 2022-06-21 NOTE — Telephone Encounter (Signed)
Pt's daughter, Rojelio Brenner returned my call and was notified of below.  She states pt is feeling better after the 2 days of increased lasix. She will be going back to 80mg  daily now. She states there is no way anyone can bring her in today for repeat troponin.  States pt has lab appt scheduled for Tuesday and she is ok for that to be added to that appt if PCP wants?  I advised pt PCP may let [facility] Troponin result instead of doing it on Tuesday.  Please advise how to proceed regarding Troponin?

## 2022-06-21 NOTE — Telephone Encounter (Signed)
Opened to review 

## 2022-06-21 NOTE — Telephone Encounter (Signed)
Dr Debara Pickett and Coletta Memos NP,  I wanted to reach out and discuss/inform you of mutual patient's status.  Recently I saw patient with 1 week of shortness of breath and 10 pound weight gain.  No chest pain was reported.  But she did report history of remote MI in the past.  I sent you a note today regarding that visit.  Her BNP was elevated slightly and chest x-ray showed below.  Bnp 604  IMPRESSION: Cardiomegaly. There is interval worsening of CHF and pulmonary edema. Small to moderate right pleural effusion.   I advised the below in "  "On discussion today I believe you stated you are taking lasix 60 mg daily presently. If so can increase lasix to 100 mg daily for next 2 days. Then take lasix 80 mg daily starting on Friday until monday. Can double up on you potassium tabs for next 3 days. Repeat cmp, bnp and chest xray this coming Tuesday provided you are feeling better."  She is feeling better clinically per  family with much improve/resolving  shortness of breath.  I did order a troponin stat that day and on review saw that she had slight elevation in the past remotely in 2018.  I am not sure if she has some demand ischemia intermittently in past.   Unfortunately the lab that I sent the stat troponin to states that there is a 5-day turnaround time?  I have advised staff to have patient return to office family states there is no way she can return except for early next week on Tuesday.  Can you give any advice under this scenario?  Can patient be able to see you on Tuesday by chance for follow-up on CHF?  Presently I have plans to repeat cmp, bnp and cxr on this coming Tuesday. May include troponin again. Would compare to one that is pending?     Thanks,  Mackie Pai, PA-C

## 2022-06-21 NOTE — Telephone Encounter (Signed)
Spoke with patient's daughter, Jenny Reichmann -- to schedule appointment. She reports patient had an iron infusion (given too fast) and was maybe volume overloaded and didn't then give her lasix. She is supposed to have labs and CXR next week.   Daughter would prefer NOT to schedule a visit yet, and instead wait on results that PCP ordered. Suggested we schedule a visit that can be cancelled if needed.   Arranged appointment on 10/27 with Denyse Amass NP

## 2022-06-21 NOTE — Telephone Encounter (Signed)
STAT Troponin HS ordered for [facility] on 06/20/11 has not resulted yet.  Called [facility] and was told that particular test cannot be ordered as STAT because it is a send out test. Specialty lab will receive specimen today or tomorrow and then there is a 5 day turnaround time for the result.  No one informed us of this when we called for the STAT.   Notified PCP and he advises [name] to call pt to see if she will return today for STAT in house Troponin. Also wants [name] to see if she is feeling, does she feel improved after increasing her diuretic?  Left message at home number for daughter, Rojelio Brenner to return my call.

## 2022-06-21 NOTE — Telephone Encounter (Signed)
Did call and talk to [facility]. Daughter of pt. Explained lab error/that stat order in computer not being run stat by recieving lab. We were not notified by lab until I asked why lab not back(I was not in office yesterday). Apologized. We offered to do repeat today  troponin today but family states can't do. Misty reassures me that that mom is much better with increase of lasix.   Sent message to cardlogist. See that note. Notified Dr. Etter Sjogren of scenario.

## 2022-06-21 NOTE — Telephone Encounter (Signed)
Opened inadvertantly. See notes placed today. Already.

## 2022-06-25 LAB — TROPONIN T, HIGH SENSITIVITY (HS-TNT): Troponin T (Highly Sensitive): 34 ng/L (ref <15)

## 2022-06-25 LAB — BRAIN NATRIURETIC PEPTIDE: Brain Natriuretic Peptide: 604 pg/mL — ABNORMAL HIGH (ref <100)

## 2022-06-25 NOTE — Addendum Note (Signed)
Addended by: Anabel Halon on: 06/25/2022 08:33 PM   Modules accepted: Orders

## 2022-06-26 ENCOUNTER — Telehealth: Payer: Self-pay | Admitting: *Deleted

## 2022-06-26 ENCOUNTER — Telehealth: Payer: Self-pay

## 2022-06-26 ENCOUNTER — Ambulatory Visit (HOSPITAL_BASED_OUTPATIENT_CLINIC_OR_DEPARTMENT_OTHER)
Admission: RE | Admit: 2022-06-26 | Discharge: 2022-06-26 | Disposition: A | Discharge status: Home / Self Care | Payer: Medicare HMO | Source: Ambulatory Visit | Attending: Medical | Admitting: Medical

## 2022-06-26 ENCOUNTER — Other Ambulatory Visit (INDEPENDENT_AMBULATORY_CARE_PROVIDER_SITE_OTHER): Payer: Medicare HMO

## 2022-06-26 DIAGNOSIS — I509 Heart failure, unspecified: Secondary | ICD-10-CM | POA: Insufficient documentation

## 2022-06-26 DIAGNOSIS — R0600 Dyspnea, unspecified: Secondary | ICD-10-CM

## 2022-06-26 DIAGNOSIS — J90 Pleural effusion, not elsewhere classified: Secondary | ICD-10-CM | POA: Diagnosis not present

## 2022-06-26 LAB — COMPREHENSIVE METABOLIC PANEL
ALT: 31 U/L (ref 0–35)
AST: 36 U/L (ref 0–37)
Albumin: 3.9 g/dL (ref 3.5–5.2)
Alkaline Phosphatase: 121 U/L — ABNORMAL HIGH (ref 39–117)
BUN: 19 mg/dL (ref 6–23)
CO2: 36 mEq/L — ABNORMAL HIGH (ref 19–32)
Calcium: 9.7 mg/dL (ref 8.4–10.5)
Chloride: 100 mEq/L (ref 96–112)
Creatinine, Ser: 0.79 mg/dL (ref 0.40–1.20)
GFR: 70.83 mL/min (ref >60.00)
Glucose, Bld: 88 mg/dL (ref 70–99)
Potassium: 4.4 mEq/L (ref 3.5–5.1)
Sodium: 142 mEq/L (ref 135–145)
Total Bilirubin: 0.7 mg/dL (ref 0.2–1.2)
Total Protein: 7.5 g/dL (ref 6.0–8.3)

## 2022-06-26 LAB — BRAIN NATRIURETIC PEPTIDE: Pro B Natriuretic peptide (BNP): 753 pg/mL — ABNORMAL HIGH (ref 0.0–100.0)

## 2022-06-26 LAB — TROPONIN I (HIGH SENSITIVITY): High Sens Troponin I: 25 ng/L (ref 2–17)

## 2022-06-26 NOTE — Telephone Encounter (Signed)
CRITICAL VALUE STICKER  CRITICAL VALUE: Troponin 25 and BNP 753  MESSENGER (representative from lab): Hope  Labs are resulted in epic, they were drawn today.

## 2022-06-26 NOTE — Telephone Encounter (Signed)
Lab Name [facility] Phone Number [phone] Lab Tech Name Higinio Roger Reference Number [id number] X Chief Complaint Lab Result (Critical or Stat) Call Type Lab Send to RN Reason for Call Report lab results Initial Comment Caller states she is calling from [place] about a critical lab result. Translation No Nurse Assessment Nurse: Patsey Berthold, RN, Roma Kayser Date/Time Eilene Ghazi Time): 06/25/2022 8:52:01 PM Is there an on-call provider listed? ---Yes Please list name of person reporting value (Lab Employee) and a contact number. ---Merwyn Katos; [phone]; [facility] ref# [id number] X, faxed- results to office. Please document the following items: Lab name Lab value (read back to lab to verify) Reference range for lab value Date and time blood was drawn Collect time of birth for bilirubin results ---BNP- 604 normal ref range <100 troponin T- 34 normal ref <15 06/19/2022 @ 2:33PM Please collect the patient contact information from the lab. (name, phone number and address) ---[name], [phone], [place]. Time Eilene Ghazi Time) Disposition Final User 06/25/2022 9:09:22 PM Called On-Call Provider Patsey Berthold, RN, Nicolas 06/25/2022 9:11:29 PM Clinical Call Yes Patsey Berthold RN, Roma Kayser Final Disposition 06/25/2022 9:11:29 PM Clinical Call Yes Patsey Berthold, RN, Milagros Loll NOTE: All timestamps contained within this report are represented as [place] Standard Time. CONFIDENTIALTY NOTICE: This fax transmission is intended only for the addressee. It contains information that is legally privileged, confidential or otherwise protected from use or disclosure. If you are not the intended recipient, you are strictly prohibited from reviewing, disclosing, copying using or disseminating any of this information or taking any action in reliance on or regarding this information. If you have received this fax in error, please notify us immediately by telephone so  that we can arrange for its return to [name]. Phone: [phone], Toll-Free: [phone], Fax: [phone] Page: 2 of 2 Call Id: [id number] Paging DoctorName Phone DateTime Result/ Outcome Message Type Notes Filomena Jungling [phone] 06/25/2022 9:09:21 PM Called On Call Provider - Reached Doctor Paged Filomena Jungling 06/25/2022 9:09:32 PM Spoke with On Call - General Message Result Reported results

## 2022-06-29 ENCOUNTER — Ambulatory Visit: Payer: Medicare HMO | Admitting: General Practice

## 2022-07-21 ENCOUNTER — Other Ambulatory Visit: Payer: Self-pay | Admitting: Medical

## 2022-07-21 ENCOUNTER — Other Ambulatory Visit: Payer: Self-pay | Admitting: General Practice

## 2022-08-07 ENCOUNTER — Other Ambulatory Visit: Payer: Self-pay | Admitting: General Practice

## 2022-08-15 ENCOUNTER — Telehealth: Payer: Self-pay | Admitting: Internal Medicine

## 2022-08-15 DIAGNOSIS — I4819 Other persistent atrial fibrillation: Secondary | ICD-10-CM

## 2022-08-15 MED ORDER — FUROSEMIDE 40 MG PO TABS: 40.0000 mg | ORAL_TABLET | Freq: Every day | ORAL | 3 refills | Status: DC

## 2022-08-15 MED ORDER — APIXABAN 5 MG PO TABS: 5.0000 mg | ORAL_TABLET | Freq: Two times a day (BID) | ORAL | 1 refills | Status: DC

## 2022-08-15 MED ORDER — LOSARTAN POTASSIUM 25 MG PO TABS: 25.0000 mg | ORAL_TABLET | Freq: Every day | ORAL | 2 refills | Status: DC

## 2022-08-15 NOTE — Telephone Encounter (Signed)
*  STAT* If patient is at the pharmacy, call can be transferred to refill team.   1. Which medications need to be refilled? (please list name of each medication and dose if known)   apixaban (ELIQUIS) 5 MG TABS tablet    furosemide (LASIX) 40 MG tablet  losartan (COZAAR) 25 MG tablet   2. Which pharmacy/location (including street and city if local pharmacy) is medication to be sent to?  [id number] - [place], [place] - [address]    3. Do they need a 30 day or 90 day supply?  90 day

## 2022-08-15 NOTE — Telephone Encounter (Signed)
Prescription refill request for Eliquis received. Indication: AF Last office visit: 02/06/22  Sherlean Foot NP Scr: 0.79 on 06/26/22 Age: 81 Weight: 103.7kg  Based on above findings Eliquis 5mg  twice daily is the appropriate dose.  Refill approved.

## 2022-08-15 NOTE — Addendum Note (Signed)
Addended by: Felecia Jan on: 08/15/2022 11:44 AM   Modules accepted: Orders

## 2022-08-22 ENCOUNTER — Encounter: Payer: Self-pay | Admitting: Family

## 2022-08-22 ENCOUNTER — Ambulatory Visit: Payer: Medicare HMO | Admitting: Internal Medicine

## 2022-08-22 ENCOUNTER — Ambulatory Visit (INDEPENDENT_AMBULATORY_CARE_PROVIDER_SITE_OTHER): Payer: Medicare HMO | Admitting: Medical

## 2022-08-22 ENCOUNTER — Inpatient Hospital Stay: Payer: Medicare HMO | Attending: Hematology & Oncology

## 2022-08-22 ENCOUNTER — Inpatient Hospital Stay (HOSPITAL_BASED_OUTPATIENT_CLINIC_OR_DEPARTMENT_OTHER): Payer: Medicare HMO | Admitting: Family

## 2022-08-22 VITALS — BP 127/77 | HR 74 | Temp 98.2°F | Resp 18 | Wt 228.0 lb

## 2022-08-22 VITALS — BP 139/64 | HR 70 | Temp 98.4°F | Resp 18 | Ht <= 58 in | Wt 228.0 lb

## 2022-08-22 DIAGNOSIS — M25512 Pain in left shoulder: Secondary | ICD-10-CM | POA: Diagnosis not present

## 2022-08-22 DIAGNOSIS — M1000 Idiopathic gout, unspecified site: Secondary | ICD-10-CM | POA: Diagnosis not present

## 2022-08-22 DIAGNOSIS — K31811 Angiodysplasia of stomach and duodenum with bleeding: Secondary | ICD-10-CM

## 2022-08-22 DIAGNOSIS — D500 Iron deficiency anemia secondary to blood loss (chronic): Secondary | ICD-10-CM

## 2022-08-22 DIAGNOSIS — K909 Intestinal malabsorption, unspecified: Secondary | ICD-10-CM | POA: Diagnosis not present

## 2022-08-22 DIAGNOSIS — M79671 Pain in right foot: Secondary | ICD-10-CM | POA: Diagnosis not present

## 2022-08-22 DIAGNOSIS — M2550 Pain in unspecified joint: Secondary | ICD-10-CM | POA: Diagnosis not present

## 2022-08-22 DIAGNOSIS — D508 Other iron deficiency anemias: Secondary | ICD-10-CM | POA: Insufficient documentation

## 2022-08-22 DIAGNOSIS — M79672 Pain in left foot: Secondary | ICD-10-CM | POA: Diagnosis not present

## 2022-08-22 DIAGNOSIS — D649 Anemia, unspecified: Secondary | ICD-10-CM

## 2022-08-22 DIAGNOSIS — G8929 Other chronic pain: Secondary | ICD-10-CM

## 2022-08-22 DIAGNOSIS — R0602 Shortness of breath: Secondary | ICD-10-CM

## 2022-08-22 DIAGNOSIS — M797 Fibromyalgia: Secondary | ICD-10-CM | POA: Diagnosis not present

## 2022-08-22 DIAGNOSIS — M25511 Pain in right shoulder: Secondary | ICD-10-CM | POA: Diagnosis not present

## 2022-08-22 DIAGNOSIS — R0902 Hypoxemia: Secondary | ICD-10-CM

## 2022-08-22 DIAGNOSIS — I509 Heart failure, unspecified: Secondary | ICD-10-CM

## 2022-08-22 DIAGNOSIS — R0600 Dyspnea, unspecified: Secondary | ICD-10-CM

## 2022-08-22 LAB — CMP (CANCER CENTER ONLY)
ALT: 40 U/L (ref 0–44)
AST: 37 U/L (ref 15–41)
Albumin: 4.1 g/dL (ref 3.5–5.0)
Alkaline Phosphatase: 121 U/L (ref 38–126)
Anion gap: 8 (ref 5–15)
BUN: 29 mg/dL — ABNORMAL HIGH (ref 8–23)
CO2: 33 mmol/L — ABNORMAL HIGH (ref 22–32)
Calcium: 9.7 mg/dL (ref 8.9–10.3)
Chloride: 102 mmol/L (ref 98–111)
Creatinine: 0.98 mg/dL (ref 0.44–1.00)
GFR, Estimated: 58 mL/min — ABNORMAL LOW (ref >60)
Glucose, Bld: 152 mg/dL — ABNORMAL HIGH (ref 70–99)
Potassium: 5.3 mmol/L — ABNORMAL HIGH (ref 3.5–5.1)
Sodium: 143 mmol/L (ref 135–145)
Total Bilirubin: 0.7 mg/dL (ref 0.3–1.2)
Total Protein: 7.6 g/dL (ref 6.5–8.1)

## 2022-08-22 LAB — CBC WITH DIFFERENTIAL (CANCER CENTER ONLY)
Abs Immature Granulocytes: 0.03 10*3/uL (ref 0.00–0.07)
Basophils Absolute: 0 10*3/uL (ref 0.0–0.1)
Basophils Relative: 0 %
Eosinophils Absolute: 0.3 10*3/uL (ref 0.0–0.5)
Eosinophils Relative: 4 %
HCT: 43.4 % (ref 36.0–46.0)
Hemoglobin: 13 g/dL (ref 12.0–15.0)
Immature Granulocytes: 0 %
Lymphocytes Relative: 15 %
Lymphs Abs: 1.2 10*3/uL (ref 0.7–4.0)
MCH: 28.9 pg (ref 26.0–34.0)
MCHC: 30 g/dL (ref 30.0–36.0)
MCV: 96.4 fL (ref 80.0–100.0)
Monocytes Absolute: 0.7 10*3/uL (ref 0.1–1.0)
Monocytes Relative: 8 %
Neutro Abs: 6.3 10*3/uL (ref 1.7–7.7)
Neutrophils Relative %: 73 %
Platelet Count: 147 10*3/uL — ABNORMAL LOW (ref 150–400)
RBC: 4.5 MIL/uL (ref 3.87–5.11)
RDW: 17.4 % — ABNORMAL HIGH (ref 11.5–15.5)
WBC Count: 8.5 10*3/uL (ref 4.0–10.5)
nRBC: 0 % (ref 0.0–0.2)

## 2022-08-22 LAB — FERRITIN: Ferritin: 229 ng/mL (ref 11–307)

## 2022-08-22 LAB — RETICULOCYTES
Immature Retic Fract: 14.7 % (ref 2.3–15.9)
RBC.: 4.42 MIL/uL (ref 3.87–5.11)
Retic Count, Absolute: 73.8 10*3/uL (ref 19.0–186.0)
Retic Ct Pct: 1.7 % (ref 0.4–3.1)

## 2022-08-22 MED ORDER — ALLOPURINOL 100 MG PO TABS: 100.0000 mg | ORAL_TABLET | Freq: Every day | ORAL | 3 refills | Status: DC

## 2022-08-22 MED ORDER — LOSARTAN POTASSIUM 25 MG PO TABS: 25.0000 mg | ORAL_TABLET | Freq: Every day | ORAL | 3 refills | Status: DC

## 2022-08-22 MED ORDER — FUROSEMIDE 40 MG PO TABS: 40.0000 mg | ORAL_TABLET | Freq: Every day | ORAL | 3 refills | Status: DC

## 2022-08-22 MED ORDER — METOPROLOL TARTRATE 25 MG PO TABS: 25.0000 mg | ORAL_TABLET | Freq: Two times a day (BID) | ORAL | 3 refills | Status: DC

## 2022-08-22 MED ORDER — HYDROCODONE-ACETAMINOPHEN 5-325 MG PO TABS: 1.0000 | ORAL_TABLET | Freq: Four times a day (QID) | ORAL | 0 refills | Status: DC | PRN

## 2022-08-22 MED ORDER — AZITHROMYCIN 250 MG PO TABS: ORAL_TABLET | ORAL | 0 refills | Status: AC

## 2022-08-22 NOTE — Patient Instructions (Addendum)
Various joint pain, heel pain, gout and fibromyalgia.  Inflammatory lab studies today. Here or with Dr. Tama Gander office.  Bilateral feet xrays to evaluate feet and heels. To see if spurs present.  We discussed pain meds today. Some mixed opinion if tramadol worked or not on discussion. Will rx low number of norco. On review no allergy to. Side effects discussed. Caution on ambulation. My concern long term would be effecting balance and falls. If rx more in future would be limited number and would get advise from supervising physician first.   Follow up in 2 weeks or sooner if needed.

## 2022-08-22 NOTE — Progress Notes (Signed)
Subjective:    Patient ID: Taylor Jenkins, female    DOB: 11-26-1940, 81 y.o.   MRN: [id number]  HPI  Pt in today statng she is having pain all over. She has aches in hips but also reports in other joints. She states feet pain even when walking short distances.  Pt has hx of gout.   Pt states she does not want narcotic. Pt daughter appears frustrated describing difficulty to see her mother in pain all the times.  She states tramadol does help. Daughter differs stating tramadol appears  not help.   Daughter advocating for medication adequate to take pain away such as narcotic if necessary  Pt lives by herself.   Pt states one provider in past mentioned fibromyalgia or lupus.    Review of Systems  Constitutional:  Negative for chills, fatigue and fever.  Respiratory:  Negative for cough, chest tightness, shortness of breath and wheezing.   Cardiovascular:  Negative for chest pain and palpitations.  Gastrointestinal:  Negative for abdominal pain, blood in stool and constipation.  Musculoskeletal:  Positive for arthralgias and back pain. Negative for myalgias.       Shoulder pain. Feet and heel pain  Skin:  Negative for rash.  Neurological:  Negative for dizziness, light-headedness and headaches.  Hematological:  Negative for adenopathy. Does not bruise/bleed easily.    Past Medical History:  Diagnosis Date   Anemia    Aortic atherosclerosis ([place]) 04/29/2017   Asthma    Atrial fibrillation, persistent ([place])    AVM (arteriovenous malformation) of duodenum, acquired with hemorrhage 11/22/2020   CAD (coronary artery disease) 2016   non-obstructive at cath   Chronic diastolic CHF (congestive heart failure) ([place])    Diabetes mellitus without complication (H[facility])    type 2   HLD (hyperlipidemia)    Hypertension    Iron deficiency anemia due to chronic blood loss 11/22/2020   S/P TAVR (transcatheter aortic valve replacement)    Severe aortic stenosis 04/26/2017     Social  History   Socioeconomic History   Marital status: Widowed    Spouse name: Not on file   Number of children: 4   Years of education: Not on file   Highest education level: Not on file  Occupational History   Occupation: Retired-Secretary  Tobacco Use   Smoking status: Never   Smokeless tobacco: Never  Vaping Use   Vaping Use: Never used  Substance and Sexual Activity   Alcohol use: No    Alcohol/week: 0.0 standard drinks of alcohol   Drug use: No   Sexual activity: Not on file  Other Topics Concern   Not on file  Social History Narrative   Pt lives alone in [place].    Social Determinants of Health   Financial Resource Strain: Low Risk  (11/16/2021)   Overall Financial Resource Strain (CARDIA)    Difficulty of Paying Living Expenses: Not hard at all  Food Insecurity: No Food Insecurity (11/16/2021)   Hunger Vital Sign    Worried About Running Out of Food in the Last Year: Never true    Ran Out of Food in the Last Year: Never true  Transportation Needs: No Transportation Needs (11/16/2021)   PRAPARE - Hydrologist (Medical): No    Lack of Transportation (Non-Medical): No  Physical Activity: Not on file  Stress: No Stress Concern Present (11/16/2021)   [place]    Feeling of Stress :  Not at all  Social Connections: Moderately Isolated (11/16/2021)   Social Connection and Isolation Panel [NHANES]    Frequency of Communication with Friends and Family: More than three times a week    Frequency of Social Gatherings with Friends and Family: Twice a week    Attends Religious Services: More than 4 times per year    Active Member of [employer] or Organizations: No    Attends Archivist Meetings: Never    Marital Status: Widowed  Intimate Partner Violence: Not At Risk (11/16/2021)   Humiliation, Afraid, Rape, and Kick questionnaire    Fear of Current or Ex-Partner: No     Emotionally Abused: No    Physically Abused: No    Sexually Abused: No    Past Surgical History:  Procedure Laterality Date   LEFT HEART CATHETERIZATION WITH CORONARY ANGIOGRAM N/A 09/27/2014   Non-obstructive disease, CRF reduction recommended. Procedure: LEFT HEART CATHETERIZATION WITH CORONARY ANGIOGRAM;  Surgeon: Blane Ohara, MD;  Location: [facility] CATH LAB;  Service: Cardiovascular;  Laterality: N/A;   RIGHT/LEFT HEART CATH AND CORONARY ANGIOGRAPHY N/A 10/30/2018   Procedure: RIGHT/LEFT HEART CATH AND CORONARY ANGIOGRAPHY;  Surgeon: Burnell Blanks, MD;  Location: [place] [facility] CV LAB;  Service: Cardiovascular;  Laterality: N/A;   TEE WITHOUT CARDIOVERSION N/A 11/18/2018   Procedure: TRANSESOPHAGEAL ECHOCARDIOGRAM (TEE);  Surgeon: Burnell Blanks, MD;  Location: [place] CV LAB;  Service: Open Heart Surgery;  Laterality: N/A;   TRANSCATHETER AORTIC VALVE REPLACEMENT, TRANSFEMORAL N/A 11/18/2018   Procedure: TRANSCATHETER AORTIC VALVE REPLACEMENT, TRANSFEMORAL;  Surgeon: Burnell Blanks, MD;  Location: [place] CV LAB;  Service: Open Heart Surgery;  Laterality: N/A;   TUBAL LIGATION      Family History  Problem Relation Age of Onset   Transient ischemic attack Mother    Heart attack Brother 75    Allergies  Allergen Reactions   Codeine Shortness Of Breath and Itching   Epinephrine Shortness Of Breath and Palpitations   Latex Anaphylaxis   Penicillins Anaphylaxis    Did it involve swelling of the face/tongue/throat, SOB, or low BP? Yes Did it involve sudden or severe rash/hives, skin peeling, or any reaction on the inside of your mouth or nose? No Did you need to seek medical attention at a hospital or doctor's office? Yes When did it last happen?      40+ years If all above answers are "NO", may proceed with cephalosporin use.    Prednisone Other (See Comments)    psychosis    Statins Other (See Comments)    Myalgias    Lidocaine Palpitations    Sulfa Antibiotics Rash    Current Outpatient Medications on File Prior to Visit  Medication Sig Dispense Refill   allopurinol (ZYLOPRIM) 100 MG tablet Take 1 tablet (100 mg total) by mouth daily. 90 tablet 1   apixaban (ELIQUIS) 5 MG TABS tablet Take 1 tablet (5 mg total) by mouth 2 (two) times daily. 180 tablet 1   calcium carbonate (TUMS EX) 750 MG chewable tablet Chew 1 tablet by mouth as needed for heartburn.      ferrous sulfate 325 (65 FE) MG EC tablet TAKE 1 TABLET BY MOUTH EVERY DAY 90 tablet 0   furosemide (LASIX) 40 MG tablet Take 1 tablet (40 mg total) by mouth daily. 90 tablet 3   hydrocortisone 2.5 % cream APPLY TO AFFECTED AREA TWICE A DAY 28 g 0   insulin NPH Human (NOVOLIN N) 100 UNIT/ML injection Inject 50  Units into the skin in the morning and at bedtime.      levalbuterol (XOPENEX HFA) 45 MCG/ACT inhaler INHALE 1 TO 2 PUFFS BY MOUTH EVERY 4 HOURS AS NEEDED FOR WHEEZE 1 each 2   losartan (COZAAR) 25 MG tablet Take 1 tablet (25 mg total) by mouth daily. 90 tablet 2   metoprolol tartrate (LOPRESSOR) 25 MG tablet Take 1 tablet (25 mg total) by mouth 2 (two) times daily. 180 tablet 0   Multiple Vitamin (MULTIVITAMIN ADULT PO) Take 1 tablet by mouth daily.     nitroGLYCERIN (NITROSTAT) 0.4 MG SL tablet Place 1 tablet (0.4 mg total) under the tongue every 5 (five) minutes x 3 doses as needed for chest pain (or severe SOB). 25 tablet 2   nystatin (MYCOSTATIN/NYSTOP) powder Apply 1 application topically once as needed (for skin iritation).     nystatin (MYCOSTATIN/NYSTOP) powder Apply 1 Application topically 3 (three) times daily. 60 g 1   potassium chloride (KLOR-CON M) 20 MEQ tablet Take 1 tablet (20 mEq total) by mouth daily. 90 tablet 3   potassium chloride (MICRO-K) 10 MEQ CR capsule TAKE 1 CAPSULE BY MOUTH EVERY DAY 90 capsule 2   traMADol (ULTRAM) 50 MG tablet TAKE 1 TABLET (50 MG TOTAL) BY MOUTH EVERY 6 (SIX) HOURS AS NEEDED FOR UP TO 5 DAYS FOR SEVERE PAIN. 12 tablet 0    vitamin C (ASCORBIC ACID) 250 MG tablet Take 250 mg by mouth daily.     zinc gluconate 50 MG tablet Take 50 mg by mouth daily.     No current facility-administered medications on file prior to visit.    BP 139/64   Pulse 70   Temp 98.4 F (36.9 C)   Resp 18   Ht 4\' 9"  (1.448 m)   Wt 228 lb (103.4 kg)   SpO2 100%   BMI 49.34 kg/m        Objective:   Physical Exam   General- No acute distress. Pleasant patient. Neck- Full range of motion, no jvd Lungs- Clear, even and unlabored. Heart- regular rate and rhythm. Neurologic- CNII- XII grossly intact.  Shoulder- pain on palpation and limited abduction. Lower ext- bilateral severe heel pain on palpation.   Patient Instructions  Various joint pain, heel pain, gout and fibromyalgia.  Inflammatory lab studies today. Here or with Dr. office.  Bilateral feet xrays to evaluate feet and heels. To see if spurs present.  We discussed pain meds today. Some mixed opinion if tramadol worked or not on discussion. Will rx low number of norco. On review no allergy to. Side effects discussed. Caution on ambulation. My concern long term would be effecting balance and falls. If rx more in future would be limited number and would get advise from supervising physician first.   Follow up in 2 weeks or sooner if needed.   Tama Gander, PA-C   Pt need azithromycin. She states needs 250 mg to use prior to dental visit. She takes one before each procedure.    Assessment & Plan:   Patient Instructions  Various joint pain, heel pain, gout and fibromyalgia.  Inflammatory lab studies today. Here or with Dr. Esperanza Richters office.  Bilateral feet xrays to evaluate feet and heels. To see if spurs present.  We discussed pain meds today. Some mixed opinion if tramadol worked or not on discussion. Will rx low number of norco. On review no allergy to. Side effects discussed. Caution on ambulation. My concern long term would be effecting  balance  and falls. If rx more in future would be limited number and would get advise from supervising physician first.   Follow up in 2 weeks or sooner if needed.   Mackie Pai, PA-C   Time spent with patient today was  42 minutes which consisted of chart review, discussing diagnosis, work up, treatment options and documentation.

## 2022-08-22 NOTE — Progress Notes (Signed)
Hematology and Oncology Follow Up Visit  Taylor Jenkins [id number] [birth year] 81 y.o. 08/22/2022   Principle Diagnosis:  Iron deficiency anemia --patient on Eliquis/malabsorption   Current Therapy:        IV iron as indicated -Venofer given on 01/10/2022 Transfusional support as needed   Interim History:  Taylor Jenkins is here today with her daughter for follow-up. She is feeling fatigue at times.  She is on 2-3 L supplemental O2 and states that her SOB seems to be stable.  No fever, chills, n/v, cough, rash, dizziness, SOB, chest pain, palpitations, abdominal pain or changes in bowel or bladder habits.  No blood loss, bruising or petechiae noted.  Chronic swelling in her lower extremities seems a little improved. Pedal pulses are 1+.  She has generalized aches and pains all over with arthritis.  No falls or syncope reported.  Appetite and hydration are good. Weight is stable at 228 lbs.   ECOG Performance Status: 2 - Symptomatic, <50% confined to bed  Medications:  Allergies as of 08/22/2022       Reactions   Codeine Shortness Of Breath, Itching   Epinephrine Shortness Of Breath, Palpitations   Latex Anaphylaxis   Penicillins Anaphylaxis   Did it involve swelling of the face/tongue/throat, SOB, or low BP? Yes Did it involve sudden or severe rash/hives, skin peeling, or any reaction on the inside of your mouth or nose? No Did you need to seek medical attention at a hospital or doctor's office? Yes When did it last happen?      40+ years If all above answers are "NO", may proceed with cephalosporin use.   Prednisone Other (See Comments)   psychosis    Statins Other (See Comments)   Myalgias   Lidocaine Palpitations   Sulfa Antibiotics Rash        Medication List        Accurate as of August 22, 2022  1:32 PM. If you have any questions, ask your nurse or doctor.          STOP taking these medications    traMADol 50 MG tablet Commonly known as: ULTRAM Stopped  by: Esperanza Richters, PA-C       TAKE these medications    allopurinol 100 MG tablet Commonly known as: ZYLOPRIM Take 1 tablet (100 mg total) by mouth daily.   apixaban 5 MG Tabs tablet Commonly known as: Eliquis Take 1 tablet (5 mg total) by mouth 2 (two) times daily.   azithromycin 250 MG tablet Commonly known as: ZITHROMAX 1 tab po 1 hour prior to dental procedure. Started by: Esperanza Richters, PA-C   calcium carbonate 750 MG chewable tablet Commonly known as: TUMS EX Chew 1 tablet by mouth as needed for heartburn.   ferrous sulfate 325 (65 FE) MG EC tablet TAKE 1 TABLET BY MOUTH EVERY DAY   furosemide 40 MG tablet Commonly known as: LASIX Take 1 tablet (40 mg total) by mouth daily.   HYDROcodone-acetaminophen 5-325 MG tablet Commonly known as: Norco Take 1 tablet by mouth every 6 (six) hours as needed for moderate pain. Started by: Esperanza Richters, PA-C   hydrocortisone 2.5 % cream APPLY TO AFFECTED AREA TWICE A DAY   insulin NPH Human 100 UNIT/ML injection Commonly known as: NOVOLIN N Inject 50 Units into the skin in the morning and at bedtime.   levalbuterol 45 MCG/ACT inhaler Commonly known as: XOPENEX HFA INHALE 1 TO 2 PUFFS BY MOUTH EVERY 4 HOURS AS NEEDED FOR WHEEZE  losartan 25 MG tablet Commonly known as: COZAAR Take 1 tablet (25 mg total) by mouth daily.   metoprolol tartrate 25 MG tablet Commonly known as: LOPRESSOR Take 1 tablet (25 mg total) by mouth 2 (two) times daily.   MULTIVITAMIN ADULT PO Take 1 tablet by mouth daily.   nitroGLYCERIN 0.4 MG SL tablet Commonly known as: NITROSTAT Place 1 tablet (0.4 mg total) under the tongue every 5 (five) minutes x 3 doses as needed for chest pain (or severe SOB).   nystatin powder Commonly known as: MYCOSTATIN/NYSTOP Apply 1 application topically once as needed (for skin iritation).   nystatin powder Commonly known as: MYCOSTATIN/NYSTOP Apply 1 Application topically 3 (three) times daily.    potassium chloride 10 MEQ CR capsule Commonly known as: MICRO-K TAKE 1 CAPSULE BY MOUTH EVERY DAY   potassium chloride SA 20 MEQ tablet Commonly known as: KLOR-CON M Take 1 tablet (20 mEq total) by mouth daily.   vitamin C 250 MG tablet Commonly known as: ASCORBIC ACID Take 250 mg by mouth daily.   zinc gluconate 50 MG tablet Take 50 mg by mouth daily.        Allergies:  Allergies  Allergen Reactions   Codeine Shortness Of Breath and Itching   Epinephrine Shortness Of Breath and Palpitations   Latex Anaphylaxis   Penicillins Anaphylaxis    Did it involve swelling of the face/tongue/throat, SOB, or low BP? Yes Did it involve sudden or severe rash/hives, skin peeling, or any reaction on the inside of your mouth or nose? No Did you need to seek medical attention at a hospital or doctor's office? Yes When did it last happen?      40+ years If all above answers are "NO", may proceed with cephalosporin use.    Prednisone Other (See Comments)    psychosis    Statins Other (See Comments)    Myalgias    Lidocaine Palpitations   Sulfa Antibiotics Rash    Past Medical History, Surgical history, Social history, and Family History were reviewed and updated.  Review of Systems: All other 10 point review of systems is negative.   Physical Exam:  vitals were not taken for this visit.   Wt Readings from Last 3 Encounters:  08/22/22 228 lb (103.4 kg)  06/19/22 233 lb 9.6 oz (106 kg)  04/02/22 228 lb (103.4 kg)    Ocular: Sclerae unicteric, pupils equal, round and reactive to light Ear-nose-throat: Oropharynx clear, dentition fair Lymphatic: No cervical or supraclavicular adenopathy Lungs no rales or rhonchi, good excursion bilaterally Heart regular rate and rhythm, no murmur appreciated Abd soft, nontender, positive bowel sounds MSK no focal spinal tenderness, no joint edema Neuro: non-focal, well-oriented, appropriate affect Breasts: Deferred   Lab Results   Component Value Date   WBC 7.8 06/19/2022   HGB 13.3 06/19/2022   HCT 42.5 06/19/2022   MCV 89.2 06/19/2022   PLT 146.0 (L) 06/19/2022   Lab Results  Component Value Date   FERRITIN 64 06/01/2022   IRON 38 06/01/2022   TIBC 428 06/01/2022   UIBC 390 06/01/2022   IRONPCTSAT 9 (L) 06/01/2022   Lab Results  Component Value Date   RETICCTPCT 2.0 06/01/2022   RBC 4.76 06/19/2022   No results found for: "KPAFRELGTCHN", "LAMBDASER", "KAPLAMBRATIO" No results found for: "IGGSERUM", "IGA", "IGMSERUM" No results found for: "TOTALPROTELP", "ALBUMINELP", "A1GS", "A2GS", "BETS", "BETA2SER", "GAMS", "MSPIKE", "SPEI"   Chemistry      Component Value Date/Time   NA 142 06/26/2022 0814  NA 143 10/26/2021 1113   K 4.4 06/26/2022 0814   CL 100 06/26/2022 0814   CO2 36 (H) 06/26/2022 0814   BUN 19 06/26/2022 0814   BUN 26 10/26/2021 1113   CREATININE 0.79 06/26/2022 0814   CREATININE 1.09 (H) 06/01/2022 1003   CREATININE 0.81 04/10/2021 1557      Component Value Date/Time   CALCIUM 9.7 06/26/2022 0814   ALKPHOS 121 (H) 06/26/2022 0814   AST 36 06/26/2022 0814   AST 57 (H) 06/01/2022 1003   ALT 31 06/26/2022 0814   ALT 39 06/01/2022 1003   BILITOT 0.7 06/26/2022 0814   BILITOT 0.6 06/01/2022 1003       Impression and Plan: Taylor Jenkins is a very pleasant 81 yo caucasian female with iron deficiency anemia secondary to intermittent GI blood loss and malabsorption.  Iron studies are pending. We will replace if needed.  Hgb stable at 13.0, MCV 96.  Follow-up in 3 months.  Taylor Stanford, NP 12/20/20231:32 PM

## 2022-08-23 LAB — IRON AND IRON BINDING CAPACITY (CC-WL,HP ONLY)
Iron: 77 ug/dL (ref 28–170)
Saturation Ratios: 23 % (ref 10.4–31.8)
TIBC: 340 ug/dL (ref 250–450)
UIBC: 263 ug/dL (ref 148–442)

## 2022-08-31 ENCOUNTER — Telehealth: Payer: Self-pay | Admitting: Medical

## 2022-08-31 NOTE — Telephone Encounter (Signed)
Misty (daughter) called stating that pt needs a letter sent to the [facility] to indicate pt is on oxygen and in the event of a power failure, she would be prioritized for restoration. Fax info is as follows:  Attn: [employer] [facility] [facility] F: [phone]

## 2022-09-05 ENCOUNTER — Encounter: Payer: Self-pay | Admitting: *Deleted

## 2022-09-05 NOTE — Telephone Encounter (Signed)
Letter faxed.

## 2022-09-12 ENCOUNTER — Encounter: Payer: Self-pay | Admitting: Physician Assistant

## 2022-09-12 ENCOUNTER — Ambulatory Visit: Payer: Medicare HMO | Attending: General Practice | Admitting: Physician Assistant

## 2022-09-12 ENCOUNTER — Encounter (HOSPITAL_COMMUNITY): Payer: Self-pay

## 2022-09-12 ENCOUNTER — Inpatient Hospital Stay (HOSPITAL_COMMUNITY): Admit: 2022-09-12 | Payer: Medicare HMO | Admitting: Cardiology

## 2022-09-12 VITALS — BP 103/65 | HR 75 | Ht <= 58 in | Wt 252.0 lb

## 2022-09-12 DIAGNOSIS — I4821 Permanent atrial fibrillation: Secondary | ICD-10-CM | POA: Diagnosis not present

## 2022-09-12 DIAGNOSIS — I5033 Acute on chronic diastolic (congestive) heart failure: Secondary | ICD-10-CM

## 2022-09-12 DIAGNOSIS — Z952 Presence of prosthetic heart valve: Secondary | ICD-10-CM

## 2022-09-12 NOTE — Progress Notes (Signed)
Cardiology Office Note:    Date:  09/12/2022   ID:  Taylor Jenkins, DOB 05/13/1941, MRN [id number]  PCP:  Jenkins, Taylor, [facility] Cardiologist: Taylor Casino, MD   Reason for visit: Acute on chronic diastolic CHF  History of Present Illness:    Taylor Jenkins is a 82 y.o. female with a hx of aortic stenosis status post TAVR 6/25, HTN, diastolic CHF, insulin-dependent diabetes mellitus, permanent atrial fibrillation on Eliquis, chronic anemia, prior AVMs clipped in 2018, and mild-moderate nonobstructive CAD by catheterization 2/20.   She had noted weight gain of 20 pounds over 3 months at her clinic appointment on October 26, 2021.  When she was seen back in June 2023, she denied heart failure symptoms and was at her baseline weight of 228 pounds.    Since then, she has continued Lasix 40 mg to 60 mg daily.  However during the last 2 weeks, she has noted fluid accumulation, bloating, early satiety increased fatigue, increased dyspnea on exertion, chest pressure, increased lower extremity edema and decreased urination.  Over the last 4 days they have increased Lasix to 80 mg daily without improvement in urination or symptoms.  She sleeps with 5 pillows.  She has noted increased PND and orthopnea.  She has had increased her oxygen from 2-1/2 L/min 24 hours a day to 3 or 3-1/2 L/min.    She lives alone though stays active with a walker.  Her 2 daughters are nurses- 1 ER, 1 cardiac and check on her every 2 to 3 days.  They are concerned about ascites.  Patient has a history of fatty liver.  No history of alcohol use.  Patient avoids salt and usually has 1 meal per day.    Past Medical History:  Diagnosis Date   Anemia    Aortic atherosclerosis ([place]) 04/29/2017   Asthma    Atrial fibrillation, persistent ([place])    AVM (arteriovenous malformation) of duodenum, acquired with hemorrhage 11/22/2020   CAD (coronary artery disease) 2016   non-obstructive at cath   Chronic  diastolic CHF (congestive heart failure) ([place])    Diabetes mellitus without complication ([place])    type 2   HLD (hyperlipidemia)    Hypertension    Iron deficiency anemia due to chronic blood loss 11/22/2020   S/P TAVR (transcatheter aortic valve replacement)    Severe aortic stenosis 04/26/2017    Past Surgical History:  Procedure Laterality Date   LEFT HEART CATHETERIZATION WITH CORONARY ANGIOGRAM N/A 09/27/2014   Non-obstructive disease, CRF reduction recommended. Procedure: LEFT HEART CATHETERIZATION WITH CORONARY ANGIOGRAM;  Surgeon: Blane Ohara, MD;  Location: [facility] CATH LAB;  Service: Cardiovascular;  Laterality: N/A;   RIGHT/LEFT HEART CATH AND CORONARY ANGIOGRAPHY N/A 10/30/2018   Procedure: RIGHT/LEFT HEART CATH AND CORONARY ANGIOGRAPHY;  Surgeon: Burnell Blanks, MD;  Location: [place] [facility] CV LAB;  Service: Cardiovascular;  Laterality: N/A;   TEE WITHOUT CARDIOVERSION N/A 11/18/2018   Procedure: TRANSESOPHAGEAL ECHOCARDIOGRAM (TEE);  Surgeon: Burnell Blanks, MD;  Location: [place] CV LAB;  Service: Open Heart Surgery;  Laterality: N/A;   TRANSCATHETER AORTIC VALVE REPLACEMENT, TRANSFEMORAL N/A 11/18/2018   Procedure: TRANSCATHETER AORTIC VALVE REPLACEMENT, TRANSFEMORAL;  Surgeon: Burnell Blanks, MD;  Location: [place] CV LAB;  Service: Open Heart Surgery;  Laterality: N/A;   TUBAL LIGATION      Current Medications: No outpatient medications have been marked as taking for the 09/12/22 encounter (Office Visit) with Taylor Lacy, PA-C.  Allergies:   Codeine, Epinephrine, Latex, Penicillins, Prednisone, Statins, Norco [hydrocodone-acetaminophen], Lidocaine, and Sulfa antibiotics   Social History   Socioeconomic History   Marital status: Widowed    Spouse name: Not on file   Number of children: 4   Years of education: Not on file   Highest education level: Not on file  Occupational History   Occupation: Retired-Secretary  Tobacco Use    Smoking status: Never   Smokeless tobacco: Never  Vaping Use   Vaping Use: Never used  Substance and Sexual Activity   Alcohol use: No    Alcohol/week: 0.0 standard drinks of alcohol   Drug use: No   Sexual activity: Not on file  Other Topics Concern   Not on file  Social History Narrative   [name].    Social Determinants of Health   Financial Resource Strain: Low Risk  (11/16/2021)   Overall Financial Resource Strain (CARDIA)    Difficulty of Paying Living Expenses: Not hard at all  Food Insecurity: No Food Insecurity (11/16/2021)   Hunger Vital Sign    Worried About Running Out of Food in the Last Year: Never true    Ran Out of Food in the Last Year: Never true  Transportation Needs: No Transportation Needs (11/16/2021)   PRAPARE - Hydrologist (Medical): No    Lack of Transportation (Non-Medical): No  Physical Activity: Not on file  Stress: No Stress Concern Present (11/16/2021)   [place]    Feeling of Stress : Not at all  Social Connections: Moderately Isolated (11/16/2021)   Social Connection and Isolation Panel [NHANES]    Frequency of Communication with Friends and Family: More than three times a week    Frequency of Social Gatherings with Friends and Family: Twice a week    Attends Religious Services: More than 4 times per year    Active Member of [employer] or Organizations: No    Attends Archivist Meetings: Never    Marital Status: Widowed     Family History: The patient's family history includes Heart attack (age of onset: 48) in her brother; Transient ischemic attack in her mother.  ROS:   Please see the history of present illness.     EKGs/Labs/Other Studies Reviewed:    Recent Labs: 06/19/2022: Brain Natriuretic Peptide 604 06/26/2022: Pro B Natriuretic peptide (BNP) 753.0 08/22/2022: ALT 40; BUN 29; Creatinine 0.98; Hemoglobin  13.0; Platelet Count 147; Potassium 5.3; Sodium 143   Recent Lipid Panel Lab Results  Component Value Date/Time   CHOL 219 (H) 09/26/2014 12:30 AM   TRIG 231 (H) 09/26/2014 12:30 AM   HDL 43 09/26/2014 12:30 AM   LDLCALC 130 (H) 09/26/2014 12:30 AM    Physical Exam:    VS:  BP 103/65   Pulse 75   Ht 4\' 9"  (1.448 m)   Wt 252 lb (114.3 kg)   BMI 54.53 kg/m    No data found.      Wt Readings from Last 3 Encounters:  09/12/22 252 lb (114.3 kg)  08/22/22 228 lb (103.4 kg)  08/22/22 228 lb (103.4 kg)    GEN: Breathing appears mildly labored, on oxygen via nasal cannula, sitting in wheelchair HEENT: Normal NECK: JVD to earlobe at 90 degrees; No carotid bruits CARDIAC: Irreg irreg RESPIRATORY:  Clear to auscultation without rales, wheezing or rhonchi  ABDOMEN: Distended MUSCULOSKELETAL: 2-3+ bilateral lower extremity edema with weeping SKIN:  Warm and dry NEUROLOGIC:  Alert and oriented PSYCHIATRIC:  Normal affect    ASSESSMENT AND PLAN   Acute on chronic diastolic heart failure -Echo July 2022 with EF 55 to 60%, moderately dilated RV, severely dilated LA, mild mitral stenosis, status post TAVR valve placement, mild to moderate AI -Hypervolemic on exam with 25 pound weight gain; increased PO Lasix dose has not improved symptoms and has decreased urination -Recommend direct admission to cardiac telemetry for diuresis - bed control stated bed available tonight vs. tomorrow AM likely -Given subQ lasix 80mg  in the office via furoscix -Restart lasix 80mg  PO twice daily tomorrow AM until bed available. -Asked daughter to assess diuresis with this subQ lasix dose to help guide if patient will diurese okay on IV Lasix boluses.  If not may require IV Lasix drip. -Check CMET, magnesium, BNP on admission. -Recheck 2D echocardiogram with known aortic regurgitation post TAVR.  Permanent atrial fibrillation -Continue Toprol tartrate 25 mg twice daily for rate control -Continue Eliquis 5  mg twice daily for stroke prevention  -Check CBC on admission with known history of iron deficiency, AVMs and chronic anemia.  Aortic valve disease -Aortic stenosis status post TAVR in 2020 -2D echo in July 2022 showed mild to moderate aortic valve regurgitation -Check 2D echo  Normocytic normochromic anemia -history of iron deficiency, AVMs -Check CBC on admission  Dispo -direct admission for IV diuresis.  Patient seen with Dr. Gardiner Rhyme.  She has a follow-up with me on March 6 for hospital follow-up.  Medication Adjustments/Labs and Tests Ordered: Current medicines are reviewed at length with the patient today.  Concerns regarding medicines are outlined above.  No orders of the defined types were placed in this encounter.  No orders of the defined types were placed in this encounter.   Patient Instructions  Medication Instructions:  No Changes *If you need a refill on your cardiac medications before your next appointment, please call your pharmacy*   Lab Work: No Labs If you have labs (blood work) drawn today and your tests are completely normal, you will receive your results only by: [place] (if you have MyChart) OR A paper copy in the mail If you have any lab test that is abnormal or we need to change your treatment, we will call you to review the results.   Testing/Procedures: No Testing   Follow-Up: At [facility], you and your health needs are our priority.  As part of our continuing mission to provide you with exceptional heart care, we have created designated Provider Care Teams.  These Care Teams include your primary Cardiologist (physician) and Advanced Practice Providers (APPs -  Physician Assistants and Nurse Practitioners) who all work together to provide you with the care you need, when you need it.  We recommend signing up for the patient portal called "MyChart".  Sign up information is provided on this After Visit Summary.  MyChart is used  to connect with patients for Virtual Visits (Telemedicine).  Patients are able to view lab/test results, encounter notes, upcoming appointments, etc.  Non-urgent messages can be sent to your provider as well.   To learn more about what you can do with MyChart, go to [email or web address].    Your next appointment:   6 week(s)  The format for your next appointment:   In Person  Provider:   Caron Presume, PA-C       Signed, Taylor Lacy, PA-C  09/12/2022 12:31 PM    [facility]  HeartCare  Patient seen and examined.  Agree with above documentation.  Patient reports 25 pound weight gain, appears volume overloaded on exam.  Recommend admission for IV diuresis  Little Ishikawa, MD

## 2022-09-12 NOTE — Patient Instructions (Signed)
Medication Instructions:  No Changes *If you need a refill on your cardiac medications before your next appointment, please call your pharmacy*   Lab Work: No Labs If you have labs (blood work) drawn today and your tests are completely normal, you will receive your results only by: [place] (if you have MyChart) OR A paper copy in the mail If you have any lab test that is abnormal or we need to change your treatment, we will call you to review the results.   Testing/Procedures: No Testing   Follow-Up: At [facility], you and your health needs are our priority.  As part of our continuing mission to provide you with exceptional heart care, we have created designated Provider Care Teams.  These Care Teams include your primary Cardiologist (physician) and Advanced Practice Providers (APPs -  Physician Assistants and Nurse Practitioners) who all work together to provide you with the care you need, when you need it.  We recommend signing up for the patient portal called "MyChart".  Sign up information is provided on this After Visit Summary.  MyChart is used to connect with patients for Virtual Visits (Telemedicine).  Patients are able to view lab/test results, encounter notes, upcoming appointments, etc.  Non-urgent messages can be sent to your provider as well.   To learn more about what you can do with MyChart, go to [email or web address].    Your next appointment:   6 week(s)  The format for your next appointment:   In Person  Provider:   Caron Presume, PA-C

## 2022-09-13 ENCOUNTER — Other Ambulatory Visit: Payer: Self-pay | Admitting: Medical

## 2022-09-13 ENCOUNTER — Telehealth: Payer: Self-pay | Admitting: Physician Assistant

## 2022-09-13 NOTE — Telephone Encounter (Signed)
Pt requesting to speak to RN again.

## 2022-09-13 NOTE — Telephone Encounter (Signed)
Spoke to patient's daughter Sat[place].Stated hospital still has not called with a bed yet.Stated she has decided not to be admitted even when they call except if mother worsens she will be admitted. 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

## 2022-09-13 NOTE — Telephone Encounter (Signed)
Spoke to patient's daughter Rojelio Brenner she was calling to ask Caron Presume PA if mother could have another Lasix injectable today.Stated they are still waiting on a bed.She was told by bed control it could be tomorrow before she is admitted.Stated she will be admitted to [facility] and she is concerned she will contract a illness while in hospital.She wanted to know if she can be treated as a out patient.Stated she lost 2 lbs over night.She is urinating good today.I will send message to Caron Presume PA for advice.

## 2022-09-13 NOTE — Telephone Encounter (Signed)
Pt daughter calling wanting to speak to someone about pt's intake into the hospital, a lasix pump, and lab orders. Please advise.

## 2022-09-17 NOTE — Progress Notes (Deleted)
Cardiology Office Note:    Date:  09/17/2022   ID:  Taylor Jenkins, DOB 09-21-1940, MRN [id number]  PCP:  Taylor Pai, PA-C   [facility] Cardiologist:  Taylor Casino, MD      Referring MD: Taylor Pai, PA-C   Follow-up for atrial fibrillation and CHF  History of Present Illness:    Taylor Jenkins is a 82 y.o. female with a hx of aortic stenosis status post TAVR 0000000, HTN, diastolic CHF, insulin-dependent diabetes mellitus, permanent atrial fibrillation on Eliquis, chronic anemia, prior AVMs clipped in 2018, and mild-moderate nonobstructive CAD by catheterization 2/20.  She was seen in the atrial fibrillation clinic 02/07/2021.  She had contacted the [facility] office with complaints of weakness and shortness of breath after having a COVID infection in early May.  It was noted that her atrial fibrillation continued to be rate controlled.  She did appear to be somewhat fluid volume overloaded.  She reported significant shortness of breath with minimal physical activity.  On review of her echocardiogram that showed left ventricular hypertrophy.  Her increased work of breathing was her main complaint and was new for her.  It was felt that her shortness of breath preceded her COVID infection.  Of note her hemoglobin was 7.4 in May and she received 2 units of blood and iron infusion at that time.  On recheck her hemoglobin was 14.3 on 08/30/2021.  She denied fever and chills.  Her medications were continued.  She presented to the clinic 10/26/2021 for follow-up evaluation stated she was seen 2 days prior by Dr. Larose Jenkins who increased her furosemide.  She reported that she had gained around 20 pounds over the last 3 months.  She reported that she had not been eating large quantities of food because she had a poor appetite.  She had been drinking about 80-85 ounces of liquid per day.  She stopped weighing herself because she was frustrated with her weight gain.  We reviewed her  previous echocardiogram.  Her daughter RN presented with her and reported she occasionally gaves her an extra dose of metoprolol for increased heart rate.  She reported she was started on home O2 and had been using it through the day with activity (2 L).  We reviewed the importance of daily weights, fluid restriction, low-salt diet, and physical activity.  I  ordered a BMP , continued current furosemide dosing, planned follow-up in 1 month, and gave the salty 6 diet sheet.  She presented to the clinic 02/06/22 for follow-up evaluation stated she had an episode where her hemoglobin dropped from 13 to 7 around 2 - 3 months ago.  Her apixaban was stopped at that time and she started enteric-coated aspirin.  Her hemoglobin on last check was over 12.  Her weight was 228 pounds.  She continued on 2 L nasal cannula with activity and as needed.  She typically did not need it through the day.  She was slowly increasing her physical activity and reported that her endurance was slowly returning.  She had been doing all of her activities of daily living.  She also did all of her own cooking and cleaning except vacuuming.  She had not needed 80 mg of Lasix and has only been taking 40 mg daily.  I restarted her apixaban, stopped aspirin, refilled losartan, change furosemide to 40 mg daily, continued daily weights, and planned follow-up in 4 to 6 months.  She was seen in follow-up by Taylor Presume,  PA-C, on 09/12/2022.  She reported taking 40-60 mg of furosemide daily.  During the previous 2 weeks she noted fluid accumulation, bloating, and early satiety with increased fatigue.  Her furosemide was increased to 80 mg daily over the previous 4 days without significant improvement in her urination or symptoms.  She was using 5 pillows at night.  She had increased her oxygen from 2 and half liters to 3-3 and half liters.  Direct admission was recommended and bed control was contacted.  She was given subcu Lasix 80 mg in the clinic  and instructed to continue 80 mg of furosemide p.o. daily until hospital admission.  She contacted the nurse triage line on 09/13/2022.  Hospital had still not called with bed.  A daughter stated that they decided not to admit their mother.  They asked if repeat blood work would be needed prior to follow-up clinic visit.  She presents to the clinic today for follow-up evaluation and states***  Today she denies chest pain, palpitations, melena, hematuria, hemoptysis, diaphoresis, weakness, presyncope, syncope, orthopnea, and PND.   Diastolic CHF- Weight today.  Breathing is returned to baseline.  Was seen in the clinic on 09/12/2022, direct admit was recommended, patient was given subcu furosemide.    Echocardiogram 03/20/2021 showed normal LVEF.  Continue furosemide, losartan, metoprolol, potassium Heart healthy low-sodium diet-salty 6-reviewed Maintain physical activity Daily weights-contact office with a weight increase of 2 pounds overnight or 5 pounds in a week Continue fluid restriction-64 ounces Continue lower extremity support stockings Order BMP  Atrial fibrillation-heart rate today ***75.  No irregular or accelerated heart rate.  CHA2DS2-VASc score 6-7.  Previously stopped apixaban due to significant drop in hemoglobin.   Continue  metoprolol, apixaban Heart healthy low-sodium diet Increase physical activity as tolerated  Aortic valve stenosis-status post TAVR 2020.  Echocardiogram 7/22 showed mild-moderate aortic valve regurgitation.  No increased DOE or activity intolerance. Follows with structural heart team  Normocytic normochromic anemia-hemoglobin 12.9 on 01/30/2022.  Denies recent bleeding issues. Follows with PCP   Disposition: Follow-up with Dr. 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 Procedure: LEFT HEART CATHETERIZATION WITH CORONARY ANGIOGRAM;  Surgeon: Blane Ohara, MD;  Location: [facility] CATH LAB;  Service: Cardiovascular;  Laterality: N/A;   RIGHT/LEFT HEART CATH AND CORONARY ANGIOGRAPHY N/A 10/30/2018   Procedure: RIGHT/LEFT HEART CATH AND CORONARY ANGIOGRAPHY;  Surgeon: Burnell Blanks, MD;  Location: [facility] CV LAB;  Service: Cardiovascular;  Laterality: N/A;   TEE WITHOUT CARDIOVERSION N/A 11/18/2018   Procedure: TRANSESOPHAGEAL ECHOCARDIOGRAM (TEE);  Surgeon: Burnell Blanks, MD;  Location: [facility] CV LAB;  Service: Open Heart Surgery;  Laterality: N/A;   TRANSCATHETER AORTIC VALVE REPLACEMENT, TRANSFEMORAL N/A 11/18/2018   Procedure: TRANSCATHETER AORTIC  VALVE REPLACEMENT, TRANSFEMORAL;  Surgeon: Burnell Blanks, MD;  Location: [facility] CV LAB;  Service: Open Heart Surgery;  Laterality: N/A;   TUBAL LIGATION      Current Medications: No outpatient medications have been marked as taking for the 09/19/22 encounter (Appointment) with Deberah Pelton, NP.     Allergies:   Codeine, Epinephrine, Latex, Penicillins, Prednisone, Statins, Norco [hydrocodone-acetaminophen], Lidocaine, and Sulfa antibiotics   Social History   Socioeconomic History   Marital status: Widowed    Spouse name: Not on file   Number of children: 4   Years of education: Not on file   Highest  education level: Not on file  Occupational History   Occupation: Retired-Secretary  Tobacco Use   Smoking status: Never   Smokeless tobacco: Never  Vaping Use   Vaping Use: Never used  Substance and Sexual Activity   Alcohol use: No    Alcohol/week: 0.0 standard drinks of alcohol   Drug use: No   Sexual activity: Not on file  Other Topics Concern   Not on file  Social History Narrative   Pt lives alone in [place].    Social Determinants of Health   Financial Resource Strain: Low Risk  (11/16/2021)   Overall Financial Resource Strain (CARDIA)    Difficulty of Paying Living Expenses: Not hard at all  Food Insecurity: No Food Insecurity (11/16/2021)   Hunger Vital Sign    Worried About Running Out of Food in the Last Year: Never true    Ran Out of Food in the Last Year: Never true  Transportation Needs: No Transportation Needs (11/16/2021)   PRAPARE - Hydrologist (Medical): No    Lack of Transportation (Non-Medical): No  Physical Activity: Not on file  Stress: No Stress Concern Present (11/16/2021)   [place]    Feeling of Stress : Not at all  Social Connections: Moderately Isolated (11/16/2021)   Social Connection and Isolation Panel [NHANES]    Frequency of Communication with Friends and Family: More than three times a week    Frequency of Social Gatherings with Friends and Family: Twice a week    Attends Religious Services: More than 4 times per year    Active Member of [employer] or Organizations: No    Attends Archivist Meetings: Never    Marital Status: Widowed     Family History: The patient's family history includes Heart attack (age of onset: 93) in her brother; Transient ischemic attack in her mother.  ROS:   Please see the history of present illness.     All other systems reviewed and are negative.   Risk Assessment/Calculations:    CHA2DS2-VASc Score = 7     This indicates a 11.2% annual risk of stroke. The patient's score is based upon: CHF History: 1 HTN History: 1 Diabetes History: 1 Stroke History: 0 Vascular Disease History: 1 Age Score: 2 Gender Score: 1          Physical Exam:    VS:  There were no vitals taken for this visit.    Wt Readings from Last 3 Encounters:  09/12/22 252 lb (114.3 kg)  08/22/22 228 lb (103.4 kg)  08/22/22 228 lb (103.4 kg)     GEN:  Well nourished, well developed in no acute distress HEENT: Normal NECK: No JVD; No carotid bruits LYMPHATICS: No lymphadenopathy CARDIAC: Irregularly irregular, no murmurs, rubs, gallops RESPIRATORY:  Clear to auscultation  without rales, wheezing or rhonchi  ABDOMEN: Soft, non-tender, non-distended MUSCULOSKELETAL:  No edema; No deformity  SKIN: Warm and dry NEUROLOGIC:  Alert and oriented x 3 PSYCHIATRIC:  Normal affect    EKGs/Labs/Other Studies Reviewed:    The following studies were reviewed today: Echocardiogram 03/20/2021  IMPRESSIONS     1. Left ventricular ejection fraction, by estimation, is 55 to 60%. The  left ventricle has normal function. The left ventricle has no regional  wall motion abnormalities. The left ventricular internal cavity size was  mildly dilated. Left ventricular  diastolic function could not be evaluated.   2. The right ventricle is poorly seen, but appears to be dilated and at  least moderately depressed. Right ventricular systolic function was not  well visualized. The right ventricular size is not well visualized.   3. Left atrial size was severely dilated.   4. The mitral valve is degenerative. No evidence of mitral valve  regurgitation. Mild mitral stenosis. The mean mitral valve gradient is 5.0  mmHg with average heart rate of 75 bpm. Moderate to severe mitral annular  calcification.   5. The perivalvular leak is in the 2 o'clock location, in the vicinity of  the left coronary artery ostium. The aortic valve has  been  repaired/replaced. Aortic valve regurgitation is mild to moderate. There  is a 23 mm Sapien prosthetic (TAVR) valve  present in the aortic position. Procedure Date: 11/18/2018. Echo findings  are consistent with perivalvular leak of the aortic prosthesis. Aortic  valve mean gradient measures 12.9 mmHg. Aortic valve Vmax measures 2.48  m/s. Aortic valve acceleration time  measures 85 msec.   Comparison(s): Prior images reviewed side by side. The perivalvular leak  is in a similar location, but appears a little worse.   EKG: None today.  Recent Labs: 06/19/2022: Brain Natriuretic Peptide 604 06/26/2022: Pro B Natriuretic peptide (BNP) 753.0 08/22/2022: ALT 40; BUN 29; Creatinine 0.98; Hemoglobin 13.0; Platelet Count 147; Potassium 5.3; Sodium 143  Recent Lipid Panel    Component Value Date/Time   CHOL 219 (H) 09/26/2014 0030   TRIG 231 (H) 09/26/2014 0030   HDL 43 09/26/2014 0030   CHOLHDL 5.1 09/26/2014 0030   VLDL 46 (H) 09/26/2014 0030   LDLCALC 130 (H) 09/26/2014 0030    ASSESSMENT & PLAN    ***       Medication Adjustments/Labs and Tests Ordered: Current medicines are reviewed at length with the patient today.  Concerns regarding medicines are outlined above.  No orders of the defined types were placed in this encounter.  No orders of the defined types were placed in this encounter.   There are no Patient Instructions on file for this visit.   Signed, Deberah Pelton, NP  09/17/2022 9:34 AM      Notice: This dictation was prepared with Dragon dictation along with smaller phrase technology. Any transcriptional errors that result from this process are unintentional and may not be corrected upon review.  I spent *** minutes examining this patient, reviewing medications, and using patient centered shared decision making involving her cardiac care.  Prior to her visit I spent greater than 20 minutes reviewing her past medical history,  medications, and prior  cardiac tests.

## 2022-09-19 ENCOUNTER — Ambulatory Visit: Payer: Medicare HMO | Admitting: General Practice

## 2022-09-24 ENCOUNTER — Other Ambulatory Visit: Payer: Self-pay | Admitting: Medical

## 2022-09-25 ENCOUNTER — Ambulatory Visit: Payer: Medicare HMO | Admitting: General Practice

## 2022-10-11 ENCOUNTER — Encounter (HOSPITAL_COMMUNITY): Payer: Self-pay | Admitting: *Deleted

## 2022-10-16 NOTE — Progress Notes (Unsigned)
Cardiology Office Note:    Date:  10/18/2022   ID:  Taylor Jenkins, DOB 1941/07/26, MRN [id number]  PCP:  Taylor Pai, PA-C   [place] [facility] Cardiologist:  Taylor Casino, MD      Referring MD: Taylor Pai, PA-C   Follow-up for atrial fibrillation and CHF  History of Present Illness:    Taylor Jenkins is a 82 y.o. female with a hx of aortic stenosis status post TAVR 0000000, HTN, diastolic CHF, insulin-dependent diabetes mellitus, permanent atrial fibrillation on Eliquis, chronic anemia, prior AVMs clipped in 2018, and mild-moderate nonobstructive CAD by catheterization 2/20.  She was seen in the atrial fibrillation clinic 02/07/2021.  She had contacted the [facility] office with complaints of weakness and shortness of breath after having a COVID infection in early May.  It was noted that her atrial fibrillation continued to be rate controlled.  She did appear to be somewhat fluid volume overloaded.  She reported significant shortness of breath with minimal physical activity.  On review of her echocardiogram that showed left ventricular hypertrophy.  Her increased work of breathing was her main complaint and was new for her.  It was felt that her shortness of breath preceded her COVID infection.  Of note her hemoglobin was 7.4 in May and she received 2 units of blood and iron infusion at that time.  On recheck her hemoglobin was 14.3 on 08/30/2021.  She denied fever and chills.  Her medications were continued.  She presented to the clinic 10/26/2021 for follow-up evaluation stated she was seen 2 days prior by Dr. Larose Jenkins who increased her furosemide.  She reported that she had gained around 20 pounds over the last 3 months.  She reported that she had not been eating large quantities of food because she had a poor appetite.  She had been drinking about 80-85 ounces of liquid per day.  She stopped weighing herself because she was frustrated with her weight gain.  We reviewed her  previous echocardiogram.  Her daughter RN presented with her and reported she occasionally gaves her an extra dose of metoprolol for increased heart rate.  She reported she was started on home O2 and had been using it through the day with activity (2 L).  We reviewed the importance of daily weights, fluid restriction, low-salt diet, and physical activity.  I  ordered a BMP , continued current furosemide dosing, planned follow-up in 1 month, and gave the salty 6 diet sheet.  She presented to the clinic 02/06/22 for follow-up evaluation stated she had an episode where her hemoglobin dropped from 13 to 7 around 2 - 3 months ago.  Her apixaban was stopped at that time and she started enteric-coated aspirin.  Her hemoglobin on last check was over 12.  Her weight was 228 pounds.  She continued on 2 L nasal cannula with activity and as needed.  She typically did not need it through the day.  She was slowly increasing her physical activity and reported that her endurance was slowly returning.  She had been doing all of her activities of daily living.  She also did all of her own cooking and cleaning except vacuuming.  She had not needed 80 mg of Lasix and has only been taking 40 mg daily.  I restarted her apixaban, stopped aspirin, refilled losartan, change furosemide to 40 mg daily, continued daily weights, and planned follow-up in 4 to 6 months.  She was seen in follow-up by Taylor Presume,  PA-C, on 09/12/2022.  She reported taking 40-60 mg of furosemide daily.  During the previous 2 weeks she noted fluid accumulation, bloating, and early satiety with increased fatigue.  Her furosemide was increased to 80 mg daily over the previous 4 days without significant improvement in her urination or symptoms.  She was using 5 pillows at night.  She had increased her oxygen from 2 and half liters to 3-3 and half liters.  Direct admission was recommended and bed control was contacted.  She was given subcu Lasix 80 mg in the clinic  and instructed to continue 80 mg of furosemide p.o. daily until hospital admission.  She contacted the nurse triage line on 09/13/2022.  Hospital had still not called with bed.  A daughter stated that they decided not to admit their mother.  They asked if repeat blood work would be needed prior to follow-up clinic visit.  She presents to the clinic today for follow-up evaluation and states she has been slowly increasing weight over the last several weeks.  She does have increased work of breathing with increased physical activity.  Her weight today is 262.2 pounds.  She reports that she has been drinking about 5 bottles of water daily.  We reviewed the importance of sodium restricted diet, fluid restriction, and daily weights.  She and her daughter expressed understanding.  She does not wish to go to the hospital.  I will apply subcu Lasix 80 mg, order BMP today, stop furosemide and start torsemide.  Increase potassium to 40 mill equivalents x 2 days and repeat BMP on Monday.  I will give a weight log and plan follow-up in 1 week.  Today she denies chest pain, palpitations, melena, hematuria, hemoptysis, diaphoresis, weakness, presyncope, syncope, orthopnea, and PND.    Past Medical History:  Diagnosis Date   Anemia    Aortic atherosclerosis ([place]) 04/29/2017   Asthma    Atrial fibrillation, persistent ([place])    AVM (arteriovenous malformation) of duodenum, acquired with hemorrhage 11/22/2020   CAD (coronary artery disease) 2016   non-obstructive at cath   Chronic diastolic CHF (congestive heart failure) ([place])    Diabetes mellitus without complication ([place])    type 2   HLD (hyperlipidemia)    Hypertension    Iron deficiency anemia due to chronic blood loss 11/22/2020   S/P TAVR (transcatheter aortic valve replacement)    Severe aortic stenosis 04/26/2017    Past Surgical History:  Procedure Laterality Date   LEFT HEART CATHETERIZATION WITH CORONARY ANGIOGRAM N/A 09/27/2014   Non-obstructive  disease, CRF reduction recommended. Procedure: LEFT HEART CATHETERIZATION WITH CORONARY ANGIOGRAM;  Surgeon: Blane Ohara, MD;  Location[facility] C[facility] LAB;  Service: Cardiovascular;  Laterality: N/A;   RIGHT/LEFT HEART CATH AND CORONARY ANGIOGRAPHY N/A 10/30/2018   Procedure: RIGHT/LEFT HEART CATH AND CORONARY ANGIOGRAPHY;  Surgeon: Burnell Blanks, MD;  Location: [facility] CV LAB;  Service: Cardiovascular;  Laterality: N/A;   TEE WITHOUT CARDIOVERSION N/A 11/18/2018   Procedure: TRANSESOPHAGEAL ECHOCARDIOGRAM (TEE);  Surgeon: Burnell Blanks, MD;  Location: [facility] CV LAB;  Service: Open Heart Surgery;  Laterality: N/A;   TRANSCATHETER AORTIC VALVE REPLACEMENT, TRANSFEMORAL N/A 11/18/2018   Procedure: TRANSCATHETER AORTIC VALVE REPLACEMENT, TRANSFEMORAL;  Surgeon: Burnell Blanks, MD;  Location: [facility] CV LAB;  Service: Open Heart Surgery;  Laterality: N/A;   TUBAL LIGATION      Current Medications: Current Meds  Medication Sig   allopurinol (ZYLOPRIM) 100 MG tablet Take 1 tablet (100 mg total)  by mouth daily.   apixaban (ELIQUIS) 5 MG TABS tablet Take 1 tablet (5 mg total) by mouth 2 (two) times daily.   calcium carbonate (TUMS EX) 750 MG chewable tablet Chew 1 tablet by mouth as needed for heartburn.    ferrous sulfate 325 (65 FE) MG EC tablet TAKE 1 TABLET BY MOUTH EVERY DAY   furosemide (LASIX) 40 MG tablet Take 1 tablet (40 mg total) by mouth daily.   HYDROcodone-acetaminophen (NORCO) 5-325 MG tablet Take 1 tablet by mouth every 6 (six) hours as needed for moderate pain.   hydrocortisone 2.5 % cream APPLY TO AFFECTED AREA TWICE A DAY   insulin NPH Human (NOVOLIN N) 100 UNIT/ML injection Inject 50 Units into the skin in the morning and at bedtime.    levalbuterol (XOPENEX HFA) 45 MCG/ACT inhaler INHALE 1 TO 2 PUFFS BY MOUTH EVERY 4 HOURS AS NEEDED FOR WHEEZE   losartan (COZAAR) 25 MG tablet Take 1 tablet (25 mg total) by mouth daily.   metoprolol tartrate  (LOPRESSOR) 25 MG tablet Take 1 tablet (25 mg total) by mouth 2 (two) times daily.   Multiple Vitamin (MULTIVITAMIN ADULT PO) Take 1 tablet by mouth daily.   nitroGLYCERIN (NITROSTAT) 0.4 MG SL tablet Place 1 tablet (0.4 mg total) under the tongue every 5 (five) minutes x 3 doses as needed for chest pain (or severe SOB).   nystatin (MYCOSTATIN/NYSTOP) powder Apply 1 application topically once as needed (for skin iritation).   nystatin (MYCOSTATIN/NYSTOP) powder Apply 1 Application topically 3 (three) times daily.   potassium chloride (KLOR-CON M) 20 MEQ tablet Take 1 tablet (20 mEq total) by mouth daily.   potassium chloride (MICRO-K) 10 MEQ CR capsule TAKE 1 CAPSULE BY MOUTH EVERY DAY   vitamin C (ASCORBIC ACID) 250 MG tablet Take 250 mg by mouth daily.   zinc gluconate 50 MG tablet Take 50 mg by mouth daily.     Allergies:   Codeine, Epinephrine, Latex, Penicillins, Prednisone, Statins, Norco [hydrocodone-acetaminophen], Lidocaine, and Sulfa antibiotics   Social History   Socioeconomic History   Marital status: Widowed    Spouse name: Not on file   Number of children: 4   Years of education: Not on file   Highest education level: Not on file  Occupational History   Occupation: Retired-Secretary  Tobacco Use   Smoking status: Never   Smokeless tobacco: Never  Vaping Use   Vaping Use: Never used  Substance and Sexual Activity   Alcohol use: No    Alcohol/week: 0.0 standard drinks of alcohol   Drug use: No   Sexual activity: Not on file  Other Topics Concern   Not on file  Social History Narrative   Pt lives alone in [place].    Social Determinants of Health   Financial Resource Strain: Low Risk  (11/16/2021)   Overall Financial Resource Strain (CARDIA)    Difficulty of Paying Living Expenses: Not hard at all  Food Insecurity: No Food Insecurity (11/16/2021)   Hunger Vital Sign    Worried About Running Out of Food in the Last Year: Never true    Ran Out of Food in the  Last Year: Never true  Transportation Needs: No Transportation Needs (11/16/2021)   PRAPARE - Hydrologist (Medical): No    Lack of Transportation (Non-Medical): No  Physical Activity: Not on file  Stress: No Stress Concern Present (11/16/2021)   [place]  Feeling of Stress : Not at all  Social Connections: Moderately Isolated (11/16/2021)   Social Connection and Isolation Panel [NHANES]    Frequency of Communication with Friends and Family: More than three times a week    Frequency of Social Gatherings with Friends and Family: Twice a week    Attends Religious Services: More than 4 times per year    Active Member of [employer] or Organizations: No    Attends Archivist Meetings: Never    Marital Status: Widowed     Family History: The patient's family history includes Heart attack (age of onset: 13) in her brother; Transient ischemic attack in her mother.  ROS:   Please see the history of present illness.     All other systems reviewed and are negative.   Risk Assessment/Calculations:    CHA2DS2-VASc Score = 7    This indicates a 11.2% annual risk of stroke. The patient's score is based upon: CHF History: 1 HTN History: 1 Diabetes History: 1 Stroke History: 0 Vascular Disease History: 1 Age Score: 2 Gender Score: 1          Physical Exam:    VS:  BP 114/68   Pulse 80   Ht 4' 9"$  (1.448 m)   Wt 262 lb 3.2 oz (118.9 kg)   BMI 56.74 kg/m     Wt Readings from Last 3 Encounters:  10/18/22 262 lb 3.2 oz (118.9 kg)  09/12/22 252 lb (114.3 kg)  08/22/22 228 lb (103.4 kg)     GEN:  Well nourished, well developed in no acute distress HEENT: Normal NECK: No JVD; No carotid bruits LYMPHATICS: No lymphadenopathy CARDIAC: Irregularly irregular, no murmurs, rubs, gallops RESPIRATORY:  Clear to auscultation without rales, wheezing or rhonchi  ABDOMEN: Soft,  non-tender,distended MUSCULOSKELETAL: Bilateral lower extremity 2+ pitting edema; No deformity  SKIN: Warm and dry NEUROLOGIC:  Alert and oriented x 3 PSYCHIATRIC:  Normal affect    EKGs/Labs/Other Studies Reviewed:    The following studies were reviewed today: Echocardiogram 03/20/2021  IMPRESSIONS     1. Left ventricular ejection fraction, by estimation, is 55 to 60%. The  left ventricle has normal function. The left ventricle has no regional  wall motion abnormalities. The left ventricular internal cavity size was  mildly dilated. Left ventricular  diastolic function could not be evaluated.   2. The right ventricle is poorly seen, but appears to be dilated and at  least moderately depressed. Right ventricular systolic function was not  well visualized. The right ventricular size is not well visualized.   3. Left atrial size was severely dilated.   4. The mitral valve is degenerative. No evidence of mitral valve  regurgitation. Mild mitral stenosis. The mean mitral valve gradient is 5.0  mmHg with average heart rate of 75 bpm. Moderate to severe mitral annular  calcification.   5. The perivalvular leak is in the 2 o'clock location, in the vicinity of  the left coronary artery ostium. The aortic valve has been  repaired/replaced. Aortic valve regurgitation is mild to moderate. There  is a 23 mm Sapien prosthetic (TAVR) valve  present in the aortic position. Procedure Date: 11/18/2018. Echo findings  are consistent with perivalvular leak of the aortic prosthesis. Aortic  valve mean gradient measures 12.9 mmHg. Aortic valve Vmax measures 2.48  m/s. Aortic valve acceleration time  measures 85 msec.   Comparison(s): Prior images reviewed side by side. The perivalvular leak  is in a similar location, but appears a little  worse.   EKG: None today.  Recent Labs: 06/19/2022: Brain Natriuretic Peptide 604 06/26/2022: Pro B Natriuretic peptide (BNP) 753.0 08/22/2022: ALT 40; BUN  29; Creatinine 0.98; Hemoglobin 13.0; Platelet Count 147; Potassium 5.3; Sodium 143  Recent Lipid Panel    Component Value Date/Time   CHOL 219 (H) 09/26/2014 0030   TRIG 231 (H) 09/26/2014 0030   HDL 43 09/26/2014 0030   CHOLHDL 5.1 09/26/2014 0030   VLDL 46 (H) 09/26/2014 0030   LDLCALC 130 (H) 09/26/2014 0030    ASSESSMENT & PLAN     Diastolic CHF- Weight today 262.  NYHA class III.  Bilateral lower extremity 2+ pitting edema, abdomen distended.  Does not want to be admitted to the hospital.  Was seen in the clinic on 09/12/2022, direct admit was recommended, patient was given subcu furosemide.    Echocardiogram 03/20/2021 showed normal LVEF.  Continue  losartan, metoprolol, potassium Stop furosemide Start torsemide 20 mg daily-will plan to start Sunday Increase potassium to 40 mill equivalents x 2 days then resume 20 mill equivalents daily Heart healthy low-sodium diet-salty 6-reviewed Maintain physical activity Daily weights-contact office with a weight increase of 2 pounds overnight or 5 pounds in a week Continue fluid restriction-64 ounces Continue lower extremity support stockings Applied subcu 80 mg furosemide in clinic today. Order BMP today and BMP on Monday.  Atrial fibrillation-heart rate today 80.   CHA2DS2-VASc score 6-7.  Previously stopped apixaban due to significant drop in hemoglobin.   Continue  metoprolol, apixaban Heart healthy low-sodium diet Increase physical activity as tolerated  Aortic valve stenosis-status post TAVR 2020.  Echocardiogram 7/22 showed mild-moderate aortic valve regurgitation.  No increased DOE or activity intolerance. Repeat echo once euvolemic  Normocytic normochromic anemia-hemoglobin 12.9 on 01/30/2022.  Denies recent bleeding issues. Follows with PCP   Disposition: Follow-up with Dr. Debara Pickett or me in 1 week      Medication Adjustments/Labs and Tests Ordered: Current medicines are reviewed at length with the patient today.   Concerns regarding medicines are outlined above.  No orders of the defined types were placed in this encounter.  No orders of the defined types were placed in this encounter.   There are no Patient Instructions on file for this visit.   Signed, Deberah Pelton, NP  10/18/2022 11:23 AM      Notice: This dictation was prepared with Dragon dictation along with smaller phrase technology. Any transcriptional errors that result from this process are unintentional and may not be corrected upon review.  I spent  14  minutes examining this patient, reviewing medications, and using patient centered shared decision making involving her cardiac care.  Prior to her visit I spent greater than 20 minutes reviewing her past medical history,  medications, and prior cardiac tests.

## 2022-10-18 ENCOUNTER — Ambulatory Visit: Payer: Medicare HMO | Attending: General Practice | Admitting: General Practice

## 2022-10-18 ENCOUNTER — Encounter: Payer: Self-pay | Admitting: General Practice

## 2022-10-18 VITALS — BP 114/68 | HR 80 | Ht <= 58 in | Wt 262.2 lb

## 2022-10-18 DIAGNOSIS — I5032 Chronic diastolic (congestive) heart failure: Secondary | ICD-10-CM | POA: Diagnosis not present

## 2022-10-18 DIAGNOSIS — D500 Iron deficiency anemia secondary to blood loss (chronic): Secondary | ICD-10-CM | POA: Diagnosis not present

## 2022-10-18 DIAGNOSIS — Z952 Presence of prosthetic heart valve: Secondary | ICD-10-CM | POA: Diagnosis not present

## 2022-10-18 DIAGNOSIS — I4821 Permanent atrial fibrillation: Secondary | ICD-10-CM

## 2022-10-18 MED ORDER — FUROSCIX 80 MG/10ML ~~LOC~~ CTKT: 80.0000 mg | CARTRIDGE | Freq: Once | SUBCUTANEOUS | 0 refills | Status: DC

## 2022-10-18 MED ORDER — POTASSIUM CHLORIDE CRYS ER 20 MEQ PO TBCR: 20.0000 meq | EXTENDED_RELEASE_TABLET | Freq: Every day | ORAL | 3 refills | Status: DC

## 2022-10-18 MED ORDER — TORSEMIDE 20 MG PO TABS: 20.0000 mg | ORAL_TABLET | Freq: Every day | ORAL | 3 refills | Status: DC

## 2022-10-18 NOTE — Patient Instructions (Signed)
Medication Instructions:  STOP LASIX  TAKE POTASSIUM 40MEQ FOR 2 DAYS THEN BACK TO 20MEQ DAILY  START THE TORSEMIDE 20MG ON SUNDAY 10-21-22 *If you need a refill on your cardiac medications before your next appointment, please call your pharmacy*   Lab Work: BMET [place] 10-23-22 If you have labs (blood work) drawn today and your tests are completely normal, you will receive your results only by: [place] (if you have MyChart) OR A paper copy in the mail If you have any lab test that is abnormal or we need to change your treatment, we will call you to review the results.  Testing/Procedures: FUROSCIX PLACED HERE IN THE OFFICE-TAKE THIS OFF TOMORROW MORNING  Follow-Up: At [facility], you and your health needs are our priority.  As part of our continuing mission to provide you with exceptional heart care, we have created designated Provider Care Teams.  These Care Teams include your primary Cardiologist (physician) and Advanced Practice Providers (APPs -  Physician Assistants and Nurse Practitioners) who all work together to provide you with the care you need, when you need it.  Your next appointment:   THURSDAY   Provider:   Coletta Memos, FNP-C   Other Instructions TAKE AND LOG YOUR BLOOD PRESSURE AND YOUR WEIGHT DAILY  PLEASE READ AND FOLLOW ATTACHED  SALTY 6

## 2022-10-19 LAB — BASIC METABOLIC PANEL
BUN/Creatinine Ratio: 33 — ABNORMAL HIGH (ref 12–28)
BUN: 35 mg/dL — ABNORMAL HIGH (ref 8–27)
CO2: 25 mmol/L (ref 20–29)
Calcium: 9.1 mg/dL (ref 8.7–10.3)
Chloride: 101 mmol/L (ref 96–106)
Creatinine, Ser: 1.07 mg/dL — ABNORMAL HIGH (ref 0.57–1.00)
Glucose: 92 mg/dL (ref 70–99)
Potassium: 4.9 mmol/L (ref 3.5–5.2)
Sodium: 145 mmol/L — ABNORMAL HIGH (ref 134–144)
eGFR: 53 mL/min/{1.73_m2} — ABNORMAL LOW (ref >59)

## 2022-10-22 DIAGNOSIS — I5032 Chronic diastolic (congestive) heart failure: Secondary | ICD-10-CM | POA: Diagnosis not present

## 2022-10-22 DIAGNOSIS — Z952 Presence of prosthetic heart valve: Secondary | ICD-10-CM | POA: Diagnosis not present

## 2022-10-22 DIAGNOSIS — I4821 Permanent atrial fibrillation: Secondary | ICD-10-CM | POA: Diagnosis not present

## 2022-10-22 LAB — BASIC METABOLIC PANEL
BUN/Creatinine Ratio: 30 — ABNORMAL HIGH (ref 12–28)
BUN: 39 mg/dL — ABNORMAL HIGH (ref 8–27)
CO2: 28 mmol/L (ref 20–29)
Calcium: 8.9 mg/dL (ref 8.7–10.3)
Chloride: 100 mmol/L (ref 96–106)
Creatinine, Ser: 1.3 mg/dL — ABNORMAL HIGH (ref 0.57–1.00)
Glucose: 75 mg/dL (ref 70–99)
Potassium: 4.9 mmol/L (ref 3.5–5.2)
Sodium: 141 mmol/L (ref 134–144)
eGFR: 42 mL/min/{1.73_m2} — ABNORMAL LOW (ref >59)

## 2022-10-24 NOTE — Progress Notes (Deleted)
Cardiology Office Note:    Date:  10/24/2022   ID:  Taylor Jenkins, DOB 02/10/1941, MRN [id number]  PCP:  Mackie Pai, PA-C   [place] Providers Cardiologist:  Pixie Casino, MD      Referring MD: Mackie Pai, PA-C   Follow-up for atrial fibrillation and CHF  History of Present Illness:    Taylor Jenkins is a 82 y.o. female with a hx of aortic stenosis status post TAVR 0000000, HTN, diastolic CHF, insulin-dependent diabetes mellitus, permanent atrial fibrillation on Eliquis, chronic anemia, prior AVMs clipped in 2018, and mild-moderate nonobstructive CAD by catheterization 2/20.  She was seen in the atrial fibrillation clinic 02/07/2021.  She had contacted the [facility] office with complaints of weakness and shortness of breath after having a COVID infection in early May.  It was noted that her atrial fibrillation continued to be rate controlled.  She did appear to be somewhat fluid volume overloaded.  She reported significant shortness of breath with minimal physical activity.  On review of her echocardiogram that showed left ventricular hypertrophy.  Her increased work of breathing was her main complaint and was new for her.  It was felt that her shortness of breath preceded her COVID infection.  Of note her hemoglobin was 7.4 in May and she received 2 units of blood and iron infusion at that time.  On recheck her hemoglobin was 14.3 on 08/30/2021.  She denied fever and chills.  Her medications were continued.  She presented to the clinic 10/26/2021 for follow-up evaluation stated she was seen 2 days prior by Dr. Larose Kells who increased her furosemide.  She reported that she had gained around 20 pounds over the last 3 months.  She reported that she had not been eating large quantities of food because she had a poor appetite.  She had been drinking about 80-85 ounces of liquid per day.  She stopped weighing herself because she was frustrated with her weight gain.  We reviewed her  previous echocardiogram.  Her daughter RN presented with her and reported she occasionally gaves her an extra dose of metoprolol for increased heart rate.  She reported she was started on home O2 and had been using it through the day with activity (2 L).  We reviewed the importance of daily weights, fluid restriction, low-salt diet, and physical activity.  I  ordered a BMP , continued current furosemide dosing, planned follow-up in 1 month, and gave the salty 6 diet sheet.  She presented to the clinic 02/06/22 for follow-up evaluation stated she had an episode where her hemoglobin dropped from 13 to 7 around 2 - 3 months ago.  Her apixaban was stopped at that time and she started enteric-coated aspirin.  Her hemoglobin on last check was over 12.  Her weight was 228 pounds.  She continued on 2 L nasal cannula with activity and as needed.  She typically did not need it through the day.  She was slowly increasing her physical activity and reported that her endurance was slowly returning.  She had been doing all of her activities of daily living.  She also did all of her own cooking and cleaning except vacuuming.  She had not needed 80 mg of Lasix and has only been taking 40 mg daily.  I restarted her apixaban, stopped aspirin, refilled losartan, change furosemide to 40 mg daily, continued daily weights, and planned follow-up in 4 to 6 months.  She was seen in follow-up by Caron Presume,  PA-C, on 09/12/2022.  She reported taking 40-60 mg of furosemide daily.  During the previous 2 weeks she noted fluid accumulation, bloating, and early satiety with increased fatigue.  Her furosemide was increased to 80 mg daily over the previous 4 days without significant improvement in her urination or symptoms.  She was using 5 pillows at night.  She had increased her oxygen from 2 and half liters to 3-3 and half liters.  Direct admission was recommended and bed control was contacted.  She was given subcu Lasix 80 mg in the clinic  and instructed to continue 80 mg of furosemide p.o. daily until hospital admission.  She contacted the nurse triage line on 09/13/2022.  Hospital had still not called with bed.  A daughter stated that they decided not to admit their mother.  They asked if repeat blood work would be needed prior to follow-up clinic visit.  She presented to the clinic 10/18/22  for follow-up evaluation and stated she had been slowly increasing weight over the last several weeks.  She did have increased work of breathing with increased physical activity.  Her weight was 262.2 pounds.  She reported that she had been drinking about 5 bottles of water daily.  We reviewed the importance of sodium restricted diet, fluid restriction, and daily weights.  She and her daughter expressed understanding.  She did not wish to go to the hospital.  I applied subcu Lasix 80 mg, ordered BMP today, stop furosemide and start torsemide.  Increase potassium to 40 mill equivalents x 2 days and repeat BMP on Monday.  I gave a weight log and plan follow-up in 1 week.  She presents to the clinic today for follow up and states***  Today she denies chest pain, palpitations, melena, hematuria, hemoptysis, diaphoresis, weakness, presyncope, syncope, orthopnea, and PND.   Diastolic CHF- Weight today 262***.  NYHA class II-III.  Tolerating torsemide well.  Notes improved diuresis.  Bilateral lower extremity 2+ pitting edema, abdomen distended.  Does not want to be admitted to the hospital.  Was seen in the clinic on 09/12/2022, direct admit was recommended, patient was given subcu furosemide.    Echocardiogram 03/20/2021 showed normal LVEF.  Continue  losartan, metoprolol, potassium, torsemide Heart healthy low-sodium diet-salty 6-again reviewed Maintain physical activity Daily weights-contact office with a weight increase of 2 pounds overnight or 5 pounds in a week Continue fluid restriction-64 ounces-continue Continue lower extremity support  stockings Creatinine slightly increased to 1.30 on 10/22/2022 with increased diuresis.  Atrial fibrillation-heart rate today 80***.   CHA2DS2-VASc score 6-7.  Previously stopped apixaban due to significant drop in hemoglobin.   Continue  metoprolol, apixaban Heart healthy low-sodium diet Increase physical activity as tolerated  Aortic valve stenosis-status post TAVR 2020.  Echocardiogram 7/22 showed mild-moderate aortic valve regurgitation.  No increased DOE or activity intolerance. Repeat echo   Normocytic normochromic anemia-hemoglobin 12.9 on 01/30/2022.  Denies recent bleeding issues. Follows with PCP   Disposition: Follow-up with Dr. Debara Pickett in 3-4 months.  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 Procedure: LEFT HEART CATHETERIZATION WITH CORONARY ANGIOGRAM;  Surgeon: Blane Ohara, MD;  Location: [facility] CATH LAB;  Service: Cardiovascular;  Laterality: N/A;   RIGHT/LEFT HEART CATH AND CORONARY ANGIOGRAPHY N/A 10/30/2018   Procedure: RIGHT/LEFT HEART CATH AND CORONARY ANGIOGRAPHY;  Surgeon: Burnell Blanks, MD;  Location: [facility] CV LAB;  Service: Cardiovascular;  Laterality: N/A;   TEE WITHOUT CARDIOVERSION N/A 11/18/2018   Procedure: TRANSESOPHAGEAL  ECHOCARDIOGRAM (TEE);  Surgeon: Burnell Blanks, MD;  Location: [facility] CV LAB;  Service: Open Heart Surgery;  Laterality: N/A;   TRANSCATHETER AORTIC VALVE REPLACEMENT, TRANSFEMORAL N/A 11/18/2018   Procedure: TRANSCATHETER AORTIC VALVE REPLACEMENT, TRANSFEMORAL;  Surgeon: Burnell Blanks, MD;  Location: [facility] CV LAB;  Service: Open Heart Surgery;  Laterality: N/A;   TUBAL LIGATION      Current Medications: No outpatient medications have been marked as taking for the 10/25/22 encounter (Appointment) with Deberah Pelton, NP.     Allergies:   Codeine, Epinephrine, Latex, Penicillins, Prednisone, Statins, Norco [hydrocodone-acetaminophen], Lidocaine, and Sulfa antibiotics   Social History   Socioeconomic History   Marital status: Widowed    Spouse name: Not on file   Number of children: 4   Years of education: Not on file   Highest education level: Not on file  Occupational History   Occupation: Retired-Secretary  Tobacco Use   Smoking status: Never   Smokeless tobacco: Never  Vaping Use   Vaping Use: Never used  Substance and Sexual Activity   Alcohol use: No    Alcohol/week: 0.0 standard drinks of alcohol   Drug use: No   Sexual activity: Not on file  Other Topics Concern   Not on file  Social History Narrative   Pt lives alone in [place].    Social Determinants of Health   Financial Resource Strain: Low Risk  (11/16/2021)   Overall Financial Resource Strain (CARDIA)    Difficulty of Paying Living Expenses: Not hard at all  Food Insecurity: No Food Insecurity (11/16/2021)   Hunger Vital Sign    Worried About Running Out of Food in the Last Year: Never true    Ran Out of Food in the Last Year: Never true  Transportation Needs: No Transportation Needs (11/16/2021)   PRAPARE - Hydrologist (Medical): No    Lack of Transportation (Non-Medical): No  Physical Activity: Not on file  Stress: No Stress Concern Present  (11/16/2021)   [place]    Feeling of Stress : Not at all  Social Connections: Moderately Isolated (11/16/2021)   Social Connection and Isolation Panel [NHANES]    Frequency of Communication with Friends and Family: More than three times a week    Frequency of Social Gatherings with Friends and Family: Twice a week    Attends Religious Services: More than 4 times per year    Active Member of [employer] or Organizations: No    Attends Archivist Meetings: Never    Marital Status: Widowed     Family History: The patient's family history includes Heart attack (age of onset: 59) in her brother; Transient ischemic attack in her mother.  ROS:  Please see the history of present illness.     All other systems reviewed and are negative.   Risk Assessment/Calculations:    CHA2DS2-VASc Score = 7    This indicates a 11.2% annual risk of stroke. The patient's score is based upon: CHF History: 1 HTN History: 1 Diabetes History: 1 Stroke History: 0 Vascular Disease History: 1 Age Score: 2 Gender Score: 1          Physical Exam:    VS:  There were no vitals taken for this visit.    Wt Readings from Last 3 Encounters:  10/18/22 262 lb 3.2 oz (118.9 kg)  09/12/22 252 lb (114.3 kg)  08/22/22 228 lb (103.4 kg)     GEN:  Well nourished, well developed in no acute distress HEENT: Normal NECK: No JVD; No carotid bruits LYMPHATICS: No lymphadenopathy CARDIAC: Irregularly irregular, no murmurs, rubs, gallops RESPIRATORY:  Clear to auscultation without rales, wheezing or rhonchi  ABDOMEN: Soft, non-tender,distended MUSCULOSKELETAL: Bilateral lower extremity 2+ pitting edema; No deformity  SKIN: Warm and dry NEUROLOGIC:  Alert and oriented x 3 PSYCHIATRIC:  Normal affect    EKGs/Labs/Other Studies Reviewed:    The following studies were reviewed today: Echocardiogram 03/20/2021  IMPRESSIONS     1. Left  ventricular ejection fraction, by estimation, is 55 to 60%. The  left ventricle has normal function. The left ventricle has no regional  wall motion abnormalities. The left ventricular internal cavity size was  mildly dilated. Left ventricular  diastolic function could not be evaluated.   2. The right ventricle is poorly seen, but appears to be dilated and at  least moderately depressed. Right ventricular systolic function was not  well visualized. The right ventricular size is not well visualized.   3. Left atrial size was severely dilated.   4. The mitral valve is degenerative. No evidence of mitral valve  regurgitation. Mild mitral stenosis. The mean mitral valve gradient is 5.0  mmHg with average heart rate of 75 bpm. Moderate to severe mitral annular  calcification.   5. The perivalvular leak is in the 2 o'clock location, in the vicinity of  the left coronary artery ostium. The aortic valve has been  repaired/replaced. Aortic valve regurgitation is mild to moderate. There  is a 23 mm Sapien prosthetic (TAVR) valve  present in the aortic position. Procedure Date: 11/18/2018. Echo findings  are consistent with perivalvular leak of the aortic prosthesis. Aortic  valve mean gradient measures 12.9 mmHg. Aortic valve Vmax measures 2.48  m/s. Aortic valve acceleration time  measures 85 msec.   Comparison(s): Prior images reviewed side by side. The perivalvular leak  is in a similar location, but appears a little worse.   EKG: None today.  Recent Labs: 06/19/2022: Brain Natriuretic Peptide 604 06/26/2022: Pro B Natriuretic peptide (BNP) 753.0 08/22/2022: ALT 40; Hemoglobin 13.0; Platelet Count 147 10/22/2022: BUN 39; Creatinine, Ser 1.30; Potassium 4.9; Sodium 141  Recent Lipid Panel    Component Value Date/Time   CHOL 219 (H) 09/26/2014 0030   TRIG 231 (H) 09/26/2014 0030   HDL 43 09/26/2014 0030   CHOLHDL 5.1 09/26/2014 0030   VLDL 46 (H) 09/26/2014 0030   LDLCALC 130 (H)  09/26/2014 0030    ASSESSMENT & PLAN    1.***      Medication Adjustments/Labs and Tests Ordered: Current medicines are reviewed at length with the patient today.  Concerns regarding medicines are outlined above.  No orders of the defined types were placed in this  encounter.  No orders of the defined types were placed in this encounter.   There are no Patient Instructions on file for this visit.   Signed, Deberah Pelton, NP  10/24/2022 8:56 AM      Notice: This dictation was prepared with Dragon dictation along with smaller phrase technology. Any transcriptional errors that result from this process are unintentional and may not be corrected upon review.  I spent  14***  minutes examining this patient, reviewing medications, and using patient centered shared decision making involving her cardiac care.  Prior to her visit I spent greater than 20 minutes reviewing her past medical history,  medications, and prior cardiac tests.

## 2022-10-25 ENCOUNTER — Telehealth: Payer: Self-pay | Admitting: General Practice

## 2022-10-25 ENCOUNTER — Ambulatory Visit: Payer: Medicare HMO | Admitting: General Practice

## 2022-10-25 NOTE — Telephone Encounter (Signed)
   Pt's daughter calling, she would like to ask pt the pt's appt can be changed to telephone visit. She said, she's the one who drives pt to her appt and she is sick today (stomach bug). She said, the pt can talk to [place] over the phone

## 2022-10-25 NOTE — Telephone Encounter (Signed)
PT DAUGHTER Taylor Jenkins  NOTIFIED (DPR). APPT RE-SCHEDULED TO 2-29 A@ 155PM. DAUGHTER STATES THAT THIS IS GOOD TIME FOR THEM. SHE WILL NOTIFY PT.

## 2022-10-30 NOTE — Progress Notes (Deleted)
Cardiology Office Note:    Date:  10/30/2022   ID:  Taylor Jenkins, DOB 1941-04-10, MRN [id number]  PCP:  Mackie Pai, PA-C   [place] Providers Cardiologist:  Pixie Casino, MD      Referring MD: Mackie Pai, PA-C   Follow-up for atrial fibrillation and CHF  History of Present Illness:    Taylor Jenkins is a 82 y.o. female with a hx of aortic stenosis status post TAVR 0000000, HTN, diastolic CHF, insulin-dependent diabetes mellitus, permanent atrial fibrillation on Eliquis, chronic anemia, prior AVMs clipped in 2018, and mild-moderate nonobstructive CAD by catheterization 2/20.  She was seen in the atrial fibrillation clinic 02/07/2021.  She had contacted the [facility] office with complaints of weakness and shortness of breath after having a COVID infection in early May.  It was noted that her atrial fibrillation continued to be rate controlled.  She did appear to be somewhat fluid volume overloaded.  She reported significant shortness of breath with minimal physical activity.  On review of her echocardiogram that showed left ventricular hypertrophy.  Her increased work of breathing was her main complaint and was new for her.  It was felt that her shortness of breath preceded her COVID infection.  Of note her hemoglobin was 7.4 in May and she received 2 units of blood and iron infusion at that time.  On recheck her hemoglobin was 14.3 on 08/30/2021.  She denied fever and chills.  Her medications were continued.  She presented to the clinic 10/26/2021 for follow-up evaluation stated she was seen 2 days prior by Dr. Larose Kells who increased her furosemide.  She reported that she had gained around 20 pounds over the last 3 months.  She reported that she had not been eating large quantities of food because she had a poor appetite.  She had been drinking about 80-85 ounces of liquid per day.  She stopped weighing herself because she was frustrated with her weight gain.  We reviewed her  previous echocardiogram.  Her daughter RN presented with her and reported she occasionally gaves her an extra dose of metoprolol for increased heart rate.  She reported she was started on home O2 and had been using it through the day with activity (2 L).  We reviewed the importance of daily weights, fluid restriction, low-salt diet, and physical activity.  I  ordered a BMP , continued current furosemide dosing, planned follow-up in 1 month, and gave the salty 6 diet sheet.  She presented to the clinic 02/06/22 for follow-up evaluation stated she had an episode where her hemoglobin dropped from 13 to 7 around 2 - 3 months ago.  Her apixaban was stopped at that time and she started enteric-coated aspirin.  Her hemoglobin on last check was over 12.  Her weight was 228 pounds.  She continued on 2 L nasal cannula with activity and as needed.  She typically did not need it through the day.  She was slowly increasing her physical activity and reported that her endurance was slowly returning.  She had been doing all of her activities of daily living.  She also did all of her own cooking and cleaning except vacuuming.  She had not needed 80 mg of Lasix and has only been taking 40 mg daily.  I restarted her apixaban, stopped aspirin, refilled losartan, change furosemide to 40 mg daily, continued daily weights, and planned follow-up in 4 to 6 months.  She was seen in follow-up by Caron Presume,  PA-C, on 09/12/2022.  She reported taking 40-60 mg of furosemide daily.  During the previous 2 weeks she noted fluid accumulation, bloating, and early satiety with increased fatigue.  Her furosemide was increased to 80 mg daily over the previous 4 days without significant improvement in her urination or symptoms.  She was using 5 pillows at night.  She had increased her oxygen from 2 and half liters to 3-3 and half liters.  Direct admission was recommended and bed control was contacted.  She was given subcu Lasix 80 mg in the clinic  and instructed to continue 80 mg of furosemide p.o. daily until hospital admission.  She contacted the nurse triage line on 09/13/2022.  Hospital had still not called with bed.  A daughter stated that they decided not to admit their mother.  They asked if repeat blood work would be needed prior to follow-up clinic visit.  She presented to the clinic 10/18/22  for follow-up evaluation and stated she had been slowly increasing weight over the last several weeks.  She did have increased work of breathing with increased physical activity.  Her weight was 262.2 pounds.  She reported that she had been drinking about 5 bottles of water daily.  We reviewed the importance of sodium restricted diet, fluid restriction, and daily weights.  She and her daughter expressed understanding.  She did not wish to go to the hospital.  I applied subcu Lasix 80 mg, ordered BMP today, stop furosemide and start torsemide.  Increase potassium to 40 mill equivalents x 2 days and repeat BMP on Monday.  I gave a weight log and plan follow-up in 1 week.  She presents to the clinic today for follow up and states***  Today she denies chest pain, palpitations, melena, hematuria, hemoptysis, diaphoresis, weakness, presyncope, syncope, orthopnea, and PND.   Diastolic CHF- Weight today 262***.  NYHA class II-III.  Tolerating torsemide well.  Notes improved diuresis.  Bilateral lower extremity 2+ pitting edema, abdomen distended.  Does not want to be admitted to the hospital.  Was seen in the clinic on 09/12/2022, direct admit was recommended, patient was given subcu furosemide.    Echocardiogram 03/20/2021 showed normal LVEF.  Continue  losartan, metoprolol, potassium, torsemide Heart healthy low-sodium diet-salty 6-again reviewed Maintain physical activity Daily weights-contact office with a weight increase of 2 pounds overnight or 5 pounds in a week Continue fluid restriction-64 ounces-continue Continue lower extremity support  stockings Creatinine slightly increased to 1.30 on 10/22/2022 with increased diuresis.  Atrial fibrillation-heart rate today 80***.   CHA2DS2-VASc score 6-7.  Previously stopped apixaban due to significant drop in hemoglobin.   Continue  metoprolol, apixaban Heart healthy low-sodium diet Increase physical activity as tolerated  Aortic valve stenosis-status post TAVR 2020.  Echocardiogram 7/22 showed mild-moderate aortic valve regurgitation.  No increased DOE or activity intolerance. Repeat echo   Normocytic normochromic anemia-hemoglobin 12.9 on 01/30/2022.  Denies recent bleeding issues. Follows with PCP   Disposition: Follow-up with Dr. Debara Pickett in 3-4 months.  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 Procedure: LEFT HEART CATHETERIZATION WITH CORONARY ANGIOGRAM;  Surgeon: Blane Ohara, MD;  Location: [facility] CATH LAB;  Service: Cardiovascular;  Laterality: N/A;   RIGHT/LEFT HEART CATH AND CORONARY ANGIOGRAPHY N/A 10/30/2018   Procedure: RIGHT/LEFT HEART CATH AND CORONARY ANGIOGRAPHY;  Surgeon: Burnell Blanks, MD;  Location: [facility] CV LAB;  Service: Cardiovascular;  Laterality: N/A;   TEE WITHOUT CARDIOVERSION N/A 11/18/2018   Procedure: TRANSESOPHAGEAL  ECHOCARDIOGRAM (TEE);  Surgeon: Burnell Blanks, MD;  Location: [facility] LAB;  Service: Open Heart Surgery;  Laterality: N/A;   TRANSCATHETER AORTIC VALVE REPLACEMENT, TRANSFEMORAL N/A 11/18/2018   Procedure: TRANSCATHETER AORTIC VALVE REPLACEMENT, TRANSFEMORAL;  Surgeon: Burnell Blanks, MD;  Location: [facility] LAB;  Service: Open Heart Surgery;  Laterality: N/A;   TUBAL LIGATION      Current Medications: No outpatient medications have been marked as taking for the 11/01/22 encounter (Appointment) with Deberah Pelton, NP.     Allergies:   Codeine, Epinephrine, Latex, Penicillins, Prednisone, Statins, Norco [hydrocodone-acetaminophen], Lidocaine, and Sulfa antibiotics   Social History   Socioeconomic History   Marital status: Widowed    Spouse name: Not on file   Number of children: 4   Years of education: Not on file   Highest education level: Not on file  Occupational History   Occupation: Retired-Secretary  Tobacco Use   Smoking status: Never   Smokeless tobacco: Never  Vaping Use   Vaping Use: Never used  Substance and Sexual Activity   Alcohol use: No    Alcohol/week: 0.0 standard drinks of alcohol   Drug use: No   Sexual activity: Not on file  Other Topics Concern   Not on file  Social History Narrative   Pt lives alone in [place].    Social Determinants of Health   Financial Resource Strain: Low Risk  (11/16/2021)   Overall Financial Resource Strain (CARDIA)    Difficulty of Paying Living Expenses: Not hard at all  Food Insecurity: No Food Insecurity (11/16/2021)   Hunger Vital Sign    Worried About Running Out of Food in the Last Year: Never true    Ran Out of Food in the Last Year: Never true  Transportation Needs: No Transportation Needs (11/16/2021)   PRAPARE - Hydrologist (Medical): No    Lack of Transportation (Non-Medical): No  Physical Activity: Not on file  Stress: No Stress Concern Present  (11/16/2021)   [place]    Feeling of Stress : Not at all  Social Connections: Moderately Isolated (11/16/2021)   Social Connection and Isolation Panel [NHANES]    Frequency of Communication with Friends and Family: More than three times a week    Frequency of Social Gatherings with Friends and Family: Twice a week    Attends Religious Services: More than 4 times per year    Active Member of [employer] or Organizations: No    Attends Archivist Meetings: Never    Marital Status: Widowed     Family History: The patient's family history includes Heart attack (age of onset: 49) in her brother; Transient ischemic attack in her mother.  ROS:  Please see the history of present illness.     All other systems reviewed and are negative.   Risk Assessment/Calculations:    CHA2DS2-VASc Score = 7    This indicates a 11.2% annual risk of stroke. The patient's score is based upon: CHF History: 1 HTN History: 1 Diabetes History: 1 Stroke History: 0 Vascular Disease History: 1 Age Score: 2 Gender Score: 1          Physical Exam:    VS:  There were no vitals taken for this visit.    Wt Readings from Last 3 Encounters:  10/18/22 262 lb 3.2 oz (118.9 kg)  09/12/22 252 lb (114.3 kg)  08/22/22 228 lb (103.4 kg)     GEN:  Well nourished, well developed in no acute distress HEENT: Normal NECK: No JVD; No carotid bruits LYMPHATICS: No lymphadenopathy CARDIAC: Irregularly irregular, no murmurs, rubs, gallops RESPIRATORY:  Clear to auscultation without rales, wheezing or rhonchi  ABDOMEN: Soft, non-tender,distended MUSCULOSKELETAL: Bilateral lower extremity 2+ pitting edema; No deformity  SKIN: Warm and dry NEUROLOGIC:  Alert and oriented x 3 PSYCHIATRIC:  Normal affect    EKGs/Labs/Other Studies Reviewed:    The following studies were reviewed today: Echocardiogram 03/20/2021  IMPRESSIONS     1. Left  ventricular ejection fraction, by estimation, is 55 to 60%. The  left ventricle has normal function. The left ventricle has no regional  wall motion abnormalities. The left ventricular internal cavity size was  mildly dilated. Left ventricular  diastolic function could not be evaluated.   2. The right ventricle is poorly seen, but appears to be dilated and at  least moderately depressed. Right ventricular systolic function was not  well visualized. The right ventricular size is not well visualized.   3. Left atrial size was severely dilated.   4. The mitral valve is degenerative. No evidence of mitral valve  regurgitation. Mild mitral stenosis. The mean mitral valve gradient is 5.0  mmHg with average heart rate of 75 bpm. Moderate to severe mitral annular  calcification.   5. The perivalvular leak is in the 2 o'clock location, in the vicinity of  the left coronary artery ostium. The aortic valve has been  repaired/replaced. Aortic valve regurgitation is mild to moderate. There  is a 23 mm Sapien prosthetic (TAVR) valve  present in the aortic position. Procedure Date: 11/18/2018. Echo findings  are consistent with perivalvular leak of the aortic prosthesis. Aortic  valve mean gradient measures 12.9 mmHg. Aortic valve Vmax measures 2.48  m/s. Aortic valve acceleration time  measures 85 msec.   Comparison(s): Prior images reviewed side by side. The perivalvular leak  is in a similar location, but appears a little worse.   EKG: None today.  Recent Labs: 06/19/2022: Brain Natriuretic Peptide 604 06/26/2022: Pro B Natriuretic peptide (BNP) 753.0 08/22/2022: ALT 40; Hemoglobin 13.0; Platelet Count 147 10/22/2022: BUN 39; Creatinine, Ser 1.30; Potassium 4.9; Sodium 141  Recent Lipid Panel    Component Value Date/Time   CHOL 219 (H) 09/26/2014 0030   TRIG 231 (H) 09/26/2014 0030   HDL 43 09/26/2014 0030   CHOLHDL 5.1 09/26/2014 0030   VLDL 46 (H) 09/26/2014 0030   LDLCALC 130 (H)  09/26/2014 0030    ASSESSMENT & PLAN    1.***      Medication Adjustments/Labs and Tests Ordered: Current medicines are reviewed at length with the patient today.  Concerns regarding medicines are outlined above.  No orders of the defined types were placed in this  encounter.  No orders of the defined types were placed in this encounter.   There are no Patient Instructions on file for this visit.   Signed, Deberah Pelton, NP  10/30/2022 6:08 PM      Notice: This dictation was prepared with Dragon dictation along with smaller phrase technology. Any transcriptional errors that result from this process are unintentional and may not be corrected upon review.  I spent  14***  minutes examining this patient, reviewing medications, and using patient centered shared decision making involving her cardiac care.  Prior to her visit I spent greater than 20 minutes reviewing her past medical history,  medications, and prior cardiac tests.

## 2022-11-01 ENCOUNTER — Ambulatory Visit: Payer: Medicare HMO | Admitting: General Practice

## 2022-11-05 ENCOUNTER — Inpatient Hospital Stay: Payer: Medicare HMO | Attending: Hematology & Oncology

## 2022-11-05 ENCOUNTER — Encounter: Payer: Self-pay | Admitting: Family

## 2022-11-05 ENCOUNTER — Inpatient Hospital Stay (HOSPITAL_BASED_OUTPATIENT_CLINIC_OR_DEPARTMENT_OTHER): Payer: Medicare HMO | Admitting: Family

## 2022-11-05 VITALS — BP 133/68 | HR 58 | Temp 97.8°F | Resp 18

## 2022-11-05 DIAGNOSIS — R140 Abdominal distension (gaseous): Secondary | ICD-10-CM | POA: Diagnosis not present

## 2022-11-05 DIAGNOSIS — D508 Other iron deficiency anemias: Secondary | ICD-10-CM | POA: Insufficient documentation

## 2022-11-05 DIAGNOSIS — Z7901 Long term (current) use of anticoagulants: Secondary | ICD-10-CM | POA: Diagnosis not present

## 2022-11-05 DIAGNOSIS — R0602 Shortness of breath: Secondary | ICD-10-CM | POA: Insufficient documentation

## 2022-11-05 DIAGNOSIS — K922 Gastrointestinal hemorrhage, unspecified: Secondary | ICD-10-CM | POA: Diagnosis not present

## 2022-11-05 DIAGNOSIS — D500 Iron deficiency anemia secondary to blood loss (chronic): Secondary | ICD-10-CM | POA: Diagnosis not present

## 2022-11-05 DIAGNOSIS — K909 Intestinal malabsorption, unspecified: Secondary | ICD-10-CM | POA: Diagnosis not present

## 2022-11-05 DIAGNOSIS — R609 Edema, unspecified: Secondary | ICD-10-CM | POA: Diagnosis not present

## 2022-11-05 DIAGNOSIS — Z9981 Dependence on supplemental oxygen: Secondary | ICD-10-CM | POA: Diagnosis not present

## 2022-11-05 DIAGNOSIS — R630 Anorexia: Secondary | ICD-10-CM | POA: Insufficient documentation

## 2022-11-05 DIAGNOSIS — K31811 Angiodysplasia of stomach and duodenum with bleeding: Secondary | ICD-10-CM

## 2022-11-05 LAB — CBC WITH DIFFERENTIAL (CANCER CENTER ONLY)
Abs Immature Granulocytes: 0.12 10*3/uL — ABNORMAL HIGH (ref 0.00–0.07)
Basophils Absolute: 0 10*3/uL (ref 0.0–0.1)
Basophils Relative: 0 %
Eosinophils Absolute: 0.1 10*3/uL (ref 0.0–0.5)
Eosinophils Relative: 2 %
HCT: 39.1 % (ref 36.0–46.0)
Hemoglobin: 11.5 g/dL — ABNORMAL LOW (ref 12.0–15.0)
Immature Granulocytes: 1 %
Lymphocytes Relative: 8 %
Lymphs Abs: 0.7 10*3/uL (ref 0.7–4.0)
MCH: 29.3 pg (ref 26.0–34.0)
MCHC: 29.4 g/dL — ABNORMAL LOW (ref 30.0–36.0)
MCV: 99.7 fL (ref 80.0–100.0)
Monocytes Absolute: 0.6 10*3/uL (ref 0.1–1.0)
Monocytes Relative: 6 %
Neutro Abs: 7.8 10*3/uL — ABNORMAL HIGH (ref 1.7–7.7)
Neutrophils Relative %: 83 %
Platelet Count: 213 10*3/uL (ref 150–400)
RBC: 3.92 MIL/uL (ref 3.87–5.11)
RDW: 17.1 % — ABNORMAL HIGH (ref 11.5–15.5)
WBC Count: 9.4 10*3/uL (ref 4.0–10.5)
nRBC: 0 % (ref 0.0–0.2)

## 2022-11-05 LAB — RETICULOCYTES
Immature Retic Fract: 20.3 % — ABNORMAL HIGH (ref 2.3–15.9)
RBC.: 3.93 MIL/uL (ref 3.87–5.11)
Retic Count, Absolute: 96.3 10*3/uL (ref 19.0–186.0)
Retic Ct Pct: 2.5 % (ref 0.4–3.1)

## 2022-11-05 LAB — FERRITIN: Ferritin: 163 ng/mL (ref 11–307)

## 2022-11-05 NOTE — Progress Notes (Signed)
Hematology and Oncology Follow Up Visit  Taylor Jenkins [id number] 1941-06-09 82 y.o. 11/05/2022   Principle Diagnosis:  Iron deficiency anemia --patient on Eliquis/malabsorption   Current Therapy:        IV iron as indicated -Venofer given on 01/10/2022 Transfusional support as needed   Interim History:  Taylor Jenkins is here today with her daughter for follow-up. She is not feeling well. She has had some issues with fluid retention. Her abdomen is quite distended. Bowel sounds are present throughout. She has significant swelling in her lower extremities which she states is quite painful aching and burning.  She is back on Furosemide to help reduce the fluid retention.  She states that she has history of liver insufficiency and on CT in 04/2017 she had what appeared to he questionable cirrhosis.  Pedal pulses are 1+.  She notes SOB with the fluid over load. She is currently on 2.5 L supplemental O2 24 hours a day.  She has also had loss of appetite and this had led to hypoglycemia at night. They have adjuster her insulin regimen to help avoid this. She is doing her best to hydrate appropriately. She was unable to stand for a weight.  Both she and her daughter agree that she has had some confusion and brain fog off and on.  No fever, chills, n/v, cough, rash, chest pain, palpitations or changes in bowel or bladder habits.  No blood loss noted. No abnormal bruising, no petechiae.  No new falls or syncope reported.   ECOG Performance Status: 2 - Symptomatic, <50% confined to bed  Medications:  Allergies as of 11/05/2022       Reactions   Codeine Shortness Of Breath, Itching   Epinephrine Shortness Of Breath, Palpitations   Latex Anaphylaxis   Penicillins Anaphylaxis   Did it involve swelling of the face/tongue/throat, SOB, or low BP? Yes Did it involve sudden or severe rash/hives, skin peeling, or any reaction on the inside of your mouth or nose? No Did you need to seek medical attention  at a hospital or doctor's office? Yes When did it last happen?      40+ years If all above answers are "NO", may proceed with cephalosporin use.   Prednisone Other (See Comments)   psychosis    Statins Other (See Comments)   Myalgias   Norco [hydrocodone-acetaminophen]    Lidocaine Palpitations   Sulfa Antibiotics Rash        Medication List        Accurate as of November 05, 2022  3:15 PM. If you have any questions, ask your nurse or doctor.          allopurinol 100 MG tablet Commonly known as: ZYLOPRIM Take 1 tablet (100 mg total) by mouth daily.   apixaban 5 MG Tabs tablet Commonly known as: Eliquis Take 1 tablet (5 mg total) by mouth 2 (two) times daily.   calcium carbonate 750 MG chewable tablet Commonly known as: TUMS EX Chew 1 tablet by mouth as needed for heartburn.   ferrous sulfate 325 (65 FE) MG EC tablet TAKE 1 TABLET BY MOUTH EVERY DAY   Furoscix 80 MG/10ML Ctkt Generic drug: Furosemide Inject 80 mg into the skin once for 1 dose. REMOVE DEVICE UPON WAKING IN THE MORNING TOMORROW   HYDROcodone-acetaminophen 5-325 MG tablet Commonly known as: Norco Take 1 tablet by mouth every 6 (six) hours as needed for moderate pain.   hydrocortisone 2.5 % cream APPLY TO AFFECTED AREA TWICE A  DAY   insulin NPH Human 100 UNIT/ML injection Commonly known as: NOVOLIN N Inject 50 Units into the skin in the morning and at bedtime.   levalbuterol 45 MCG/ACT inhaler Commonly known as: XOPENEX HFA INHALE 1 TO 2 PUFFS BY MOUTH EVERY 4 HOURS AS NEEDED FOR WHEEZE   losartan 25 MG tablet Commonly known as: COZAAR Take 1 tablet (25 mg total) by mouth daily.   metoprolol tartrate 25 MG tablet Commonly known as: LOPRESSOR Take 1 tablet (25 mg total) by mouth 2 (two) times daily.   MULTIVITAMIN ADULT PO Take 1 tablet by mouth daily.   nitroGLYCERIN 0.4 MG SL tablet Commonly known as: NITROSTAT Place 1 tablet (0.4 mg total) under the tongue every 5 (five) minutes x 3  doses as needed for chest pain (or severe SOB).   nystatin powder Commonly known as: MYCOSTATIN/NYSTOP Apply 1 application topically once as needed (for skin iritation).   nystatin powder Commonly known as: MYCOSTATIN/NYSTOP Apply 1 Application topically 3 (three) times daily.   potassium chloride 10 MEQ CR capsule Commonly known as: MICRO-K TAKE 1 CAPSULE BY MOUTH EVERY DAY   potassium chloride SA 20 MEQ tablet Commonly known as: KLOR-CON M Take 1 tablet (20 mEq total) by mouth daily. TAKE 40MEQ FOR 2 DAYS THEN BACK TO 20MEQ DAILY   torsemide 20 MG tablet Commonly known as: DEMADEX Take 1 tablet (20 mg total) by mouth daily. Start SUN 10-21-22   vitamin C 250 MG tablet Commonly known as: ASCORBIC ACID Take 250 mg by mouth daily.   zinc gluconate 50 MG tablet Take 50 mg by mouth daily.        Allergies:  Allergies  Allergen Reactions   Codeine Shortness Of Breath and Itching   Epinephrine Shortness Of Breath and Palpitations   Latex Anaphylaxis   Penicillins Anaphylaxis    Did it involve swelling of the face/tongue/throat, SOB, or low BP? Yes Did it involve sudden or severe rash/hives, skin peeling, or any reaction on the inside of your mouth or nose? No Did you need to seek medical attention at a hospital or doctor's office? Yes When did it last happen?      40+ years If all above answers are "NO", may proceed with cephalosporin use.    Prednisone Other (See Comments)    psychosis    Statins Other (See Comments)    Myalgias    Norco [Hydrocodone-Acetaminophen]    Lidocaine Palpitations   Sulfa Antibiotics Rash    Past Medical History, Surgical history, Social history, and Family History were reviewed and updated.  Review of Systems: All other 10 point review of systems is negative.   Physical Exam:  vitals were not taken for this visit.   Wt Readings from Last 3 Encounters:  10/18/22 262 lb 3.2 oz (118.9 kg)  09/12/22 252 lb (114.3 kg)  08/22/22 228  lb (103.4 kg)    Ocular: Sclerae unicteric, pupils equal, round and reactive to light Ear-nose-throat: Oropharynx clear, dentition fair Lymphatic: No cervical or supraclavicular adenopathy Lungs no rales or rhonchi, good excursion bilaterally Heart regular rate and rhythm, no murmur appreciated Abd soft, nontender, positive bowel sounds MSK no focal spinal tenderness, no joint edema Neuro: non-focal, well-oriented, appropriate affect Breasts: Deferred   Lab Results  Component Value Date   WBC 9.4 11/05/2022   HGB 11.5 (L) 11/05/2022   HCT 39.1 11/05/2022   MCV 99.7 11/05/2022   PLT 213 11/05/2022   Lab Results  Component Value Date  FERRITIN 229 08/22/2022   IRON 77 08/22/2022   TIBC 340 08/22/2022   UIBC 263 08/22/2022   IRONPCTSAT 23 08/22/2022   Lab Results  Component Value Date   RETICCTPCT 2.5 11/05/2022   RBC 3.92 11/05/2022   RBC 3.93 11/05/2022   No results found for: "KPAFRELGTCHN", "LAMBDASER", "KAPLAMBRATIO" No results found for: "IGGSERUM", "IGA", "IGMSERUM" No results found for: "TOTALPROTELP", "ALBUMINELP", "A1GS", "A2GS", "BETS", "BETA2SER", "GAMS", "MSPIKE", "SPEI"   Chemistry      Component Value Date/Time   NA 141 10/22/2022 1101   K 4.9 10/22/2022 1101   CL 100 10/22/2022 1101   CO2 28 10/22/2022 1101   BUN 39 (H) 10/22/2022 1101   CREATININE 1.30 (H) 10/22/2022 1101   CREATININE 0.98 08/22/2022 1302   CREATININE 0.81 04/10/2021 1557      Component Value Date/Time   CALCIUM 8.9 10/22/2022 1101   ALKPHOS 121 08/22/2022 1302   AST 37 08/22/2022 1302   ALT 40 08/22/2022 1302   BILITOT 0.7 08/22/2022 1302       Impression and Plan: Taylor Jenkins is a very pleasant 82 yo caucasian female with iron deficiency anemia secondary to intermittent GI blood loss and malabsorption.  Iron studies are pending. We will replace if needed.  I will also reach out to her PCP to see if they would be able to draw a CMP and ammonia level on her.  Follow-up in 3  months.  Lottie Dawson, NP 3/4/20243:15 PM

## 2022-11-06 ENCOUNTER — Telehealth: Payer: Self-pay

## 2022-11-06 ENCOUNTER — Encounter: Payer: Self-pay | Admitting: Internal Medicine

## 2022-11-06 ENCOUNTER — Telehealth: Payer: Self-pay | Admitting: Medical

## 2022-11-06 ENCOUNTER — Other Ambulatory Visit (HOSPITAL_BASED_OUTPATIENT_CLINIC_OR_DEPARTMENT_OTHER): Payer: Self-pay

## 2022-11-06 ENCOUNTER — Ambulatory Visit (INDEPENDENT_AMBULATORY_CARE_PROVIDER_SITE_OTHER): Payer: Medicare HMO | Admitting: Internal Medicine

## 2022-11-06 ENCOUNTER — Encounter: Payer: Self-pay | Admitting: Family

## 2022-11-06 ENCOUNTER — Encounter: Payer: Self-pay | Admitting: Medical

## 2022-11-06 VITALS — BP 136/68 | HR 64 | Temp 97.7°F | Resp 20 | Ht <= 58 in | Wt 255.2 lb

## 2022-11-06 DIAGNOSIS — E1169 Type 2 diabetes mellitus with other specified complication: Secondary | ICD-10-CM | POA: Diagnosis not present

## 2022-11-06 DIAGNOSIS — K7460 Unspecified cirrhosis of liver: Secondary | ICD-10-CM

## 2022-11-06 DIAGNOSIS — G629 Polyneuropathy, unspecified: Secondary | ICD-10-CM

## 2022-11-06 LAB — COMPREHENSIVE METABOLIC PANEL
ALT: 34 U/L (ref 0–35)
AST: 50 U/L — ABNORMAL HIGH (ref 0–37)
Albumin: 3.3 g/dL — ABNORMAL LOW (ref 3.5–5.2)
Alkaline Phosphatase: 152 U/L — ABNORMAL HIGH (ref 39–117)
BUN: 26 mg/dL — ABNORMAL HIGH (ref 6–23)
CO2: 34 mEq/L — ABNORMAL HIGH (ref 19–32)
Calcium: 9.1 mg/dL (ref 8.4–10.5)
Chloride: 97 mEq/L (ref 96–112)
Creatinine, Ser: 0.91 mg/dL (ref 0.40–1.20)
GFR: 59.62 mL/min — ABNORMAL LOW (ref >60.00)
Glucose, Bld: 125 mg/dL — ABNORMAL HIGH (ref 70–99)
Potassium: 4.5 mEq/L (ref 3.5–5.1)
Sodium: 141 mEq/L (ref 135–145)
Total Bilirubin: 0.9 mg/dL (ref 0.2–1.2)
Total Protein: 6.9 g/dL (ref 6.0–8.3)

## 2022-11-06 LAB — IRON AND IRON BINDING CAPACITY (CC-WL,HP ONLY)
Iron: 45 ug/dL (ref 28–170)
Saturation Ratios: 14 % (ref 10.4–31.8)
TIBC: 332 ug/dL (ref 250–450)
UIBC: 287 ug/dL (ref 148–442)

## 2022-11-06 LAB — B12 AND FOLATE PANEL
Folate: 23.4 ng/mL (ref >5.9)
Vitamin B-12: 1074 pg/mL — ABNORMAL HIGH (ref 211–911)

## 2022-11-06 LAB — AMMONIA: Ammonia: 26 umol/L (ref 11–35)

## 2022-11-06 LAB — HEMOGLOBIN A1C: Hgb A1c MFr Bld: 7 % — ABNORMAL HIGH (ref 4.6–6.5)

## 2022-11-06 MED ORDER — TRAMADOL HCL 50 MG PO TABS
25.0000 mg | ORAL_TABLET | Freq: Two times a day (BID) | ORAL | 0 refills | Status: DC | PRN
  Filled 2022-11-06: qty 20, 20d supply, fill #0

## 2022-11-06 MED ORDER — TRAMADOL HCL 25 MG PO TABS: 25.0000 mg | ORAL_TABLET | Freq: Two times a day (BID) | ORAL | 0 refills | Status: DC | PRN

## 2022-11-06 NOTE — Progress Notes (Signed)
Subjective:    Patient ID: Taylor Jenkins, female    DOB: 1941/08/29, 82 y.o.   MRN: [id number]  DOS:  11/06/2022 Type of visit - description: Acute, here with her daughter.  She has multiple medical problems. Her main concern is pain.  Had a fall about 2 years ago and since then is having shoulder pain, worse on the left. She also has bilateral leg pain, for a while, described as burning and stinging feeling from the groin to the ankles. During the daytime is moderate, is worse at night. Denies back pain or any previous back surgery.  Review of Systems See above   Past Medical History:  Diagnosis Date   Anemia    Aortic atherosclerosis ([place]) 04/29/2017   Asthma    Atrial fibrillation, persistent ([place])    AVM (arteriovenous malformation) of duodenum, acquired with hemorrhage 11/22/2020   CAD (coronary artery disease) 2016   non-obstructive at cath   Chronic diastolic CHF (congestive heart failure) ([place])    Diabetes mellitus without complication ([place])    type 2   HLD (hyperlipidemia)    Hypertension    Iron deficiency anemia due to chronic blood loss 11/22/2020   S/P TAVR (transcatheter aortic valve replacement)    Severe aortic stenosis 04/26/2017    Past Surgical History:  Procedure Laterality Date   LEFT HEART CATHETERIZATION WITH CORONARY ANGIOGRAM N/A 09/27/2014   Non-obstructive disease, CRF reduction recommended. Procedure: LEFT HEART CATHETERIZATION WITH CORONARY ANGIOGRAM;  Surgeon: Blane Ohara, MD;  Location: [facility] CATH LAB;  Service: Cardiovascular;  Laterality: N/A;   RIGHT/LEFT HEART CATH AND CORONARY ANGIOGRAPHY N/A 10/30/2018   Procedure: RIGHT/LEFT HEART CATH AND CORONARY ANGIOGRAPHY;  Surgeon: Burnell Blanks, MD;  Location: [place] CV LAB;  Service: Cardiovascular;  Laterality: N/A;   TEE WITHOUT CARDIOVERSION N/A 11/18/2018   Procedure: TRANSESOPHAGEAL ECHOCARDIOGRAM (TEE);  Surgeon: Burnell Blanks, MD;  Location: [facility] CV LAB;   Service: Open Heart Surgery;  Laterality: N/A;   TRANSCATHETER AORTIC VALVE REPLACEMENT, TRANSFEMORAL N/A 11/18/2018   Procedure: TRANSCATHETER AORTIC VALVE REPLACEMENT, TRANSFEMORAL;  Surgeon: Burnell Blanks, MD;  Location: [place] CV LAB;  Service: Open Heart Surgery;  Laterality: N/A;   TUBAL LIGATION      Current Outpatient Medications  Medication Instructions   allopurinol (ZYLOPRIM) 100 mg, Oral, Daily   apixaban (ELIQUIS) 5 mg, Oral, 2 times daily   calcium carbonate (TUMS EX) 750 MG chewable tablet 1 tablet, Oral, As needed   ferrous sulfate 325 mg, Oral, Daily   Furoscix 80 mg, Subcutaneous,  Once, REMOVE DEVICE UPON WAKING IN THE MORNING TOMORROW   HYDROcodone-acetaminophen (NORCO) 5-325 MG tablet 1 tablet, Oral, Every 6 hours PRN   hydrocortisone 2.5 % cream APPLY TO AFFECTED AREA TWICE A DAY   insulin NPH Human (NOVOLIN N) 50 Units, Subcutaneous, 2 times daily   levalbuterol (XOPENEX HFA) 45 MCG/ACT inhaler INHALE 1 TO 2 PUFFS BY MOUTH EVERY 4 HOURS AS NEEDED FOR WHEEZE   losartan (COZAAR) 25 mg, Oral, Daily   metoprolol tartrate (LOPRESSOR) 25 mg, Oral, 2 times daily   Multiple Vitamin (MULTIVITAMIN ADULT PO) 1 tablet, Oral, Daily   nitroGLYCERIN (NITROSTAT) 0.4 mg, Sublingual, Every 5 min x3 PRN   nystatin (MYCOSTATIN/NYSTOP) powder 1 application , Once PRN   nystatin (MYCOSTATIN/NYSTOP) powder 1 Application, Topical, 3 times daily   OXYGEN 2-3 L/min, Inhalation, Continuous   potassium chloride (MICRO-K) 10 MEQ CR capsule Oral, Daily   potassium chloride SA (KLOR-CON M)  20 MEQ tablet 20 mEq, Oral, Daily, TAKE 40MEQ FOR 2 DAYS THEN BACK TO 20MEQ DAILY   torsemide (DEMADEX) 20 mg, Oral, Daily, Start SUN 10-21-22   vitamin C (ASCORBIC ACID) 250 mg, Oral, Daily   zinc gluconate 50 mg, Oral, Daily       Objective:   Physical Exam BP 136/68   Pulse 64   Temp 97.7 F (36.5 C) (Oral)   Resp 20 Comment: 2L/min  Ht '4\' 9"'$  (1.448 m)   Wt 255 lb 4 oz (115.8 kg)    BMI 55.24 kg/m  General:   Well developed, NAD, BMI noted. HEENT:  Normocephalic . Face symmetric, atraumatic Lungs:  Breath sounds but clear Normal respiratory effort, no intercostal retractions, no accessory muscle use. Heart: Irregularly irregular Lower extremities:  Chronic ranges of the skin, + weeping noted, + swelling, leathery skin with some ulcers. Pinprick examination slightly decreased distally. Pedal pulses are diminished but present. Skin: Not pale. Not jaundice Neurologic:  alert & oriented X3.  Speech normal, gait not tested  psych--  Cognition and judgment appear intact.  Cooperative with normal attention span and concentration.  Behavior appropriate. No anxious or depressed appearing.      Assessment     82 year old female, multiple medical problems including iron deficiency anemia due to intermittent GI blood loss, last visit with hematology yesterday. Extensive CV history including diastolic CHF, A-fib, TEVAR 2020 for aortic valve stenosis, anticoagulated, CAD, hypertension, M.O., on O2 d/t heart disease, liver disease (per patient) presents with her daughter who is an ICU nurse.  Leg pain: As described above, on clinical grounds suspect neuropathy.  We talk about different options, including Neurontin, Lyrica, amitriptyline, Cymbalta.  The patient is allergic to codeine and did not like to take hydrocodone.  In the past she tried Ultram with good results and no side effects. The daughter is very concerned about medication side effects such as confusion  and will not accept gabapentin, could consider Lyrica. After long conversation we agreed on continue Tylenol once daily and start a low-dose of tramadol which she previously tolerated well. Also neurology referral, [id number], folic acid. DM: Poorly controlled, on insulin 50 units twice daily, check A1c. Confusion?  I received a message from hematology, they think the patient was slightly confused and requested CMP and  ammonia level.  Will do. Shoulder pain: This is going on for 2 years, since an accident, the patient denies headache, fever or chills. The patient has extremely complex medical history, above are my preliminary recommendations, I recommend her to continue working with PCP regards pain management.  I am referring her to neurology, she could benefit from a pain management specialist. RTC 4 weeks  Time spent 42 minutes.  She has a very extensive chart, I review as much as I could.  Patient daughter is a Marine scientist and I tried to address all her concerns about side effects of pain medicines.

## 2022-11-06 NOTE — Telephone Encounter (Signed)
PA initiated via Covermymeds; KEY: [id number].  Available without authorization

## 2022-11-06 NOTE — Progress Notes (Deleted)
Cardiology Office Note:    Date:  11/06/2022   ID:  Taylor Jenkins, DOB [birth year], MRN [id number]  PCP:  [place], Edward, [place] Cardiologist: Pixie Casino, MD   Reason for visit: 2 month follow-up  History of Present Illness:    Taylor Jenkins is a 82 y.o. female with a hx of aortic stenosis status post TAVR 0000000, HTN, diastolic CHF, insulin-dependent diabetes mellitus, permanent atrial fibrillation on Eliquis, chronic anemia, prior AVMs clipped in 2018, and mild-moderate nonobstructive CAD by catheterization 2/20.  She lives alone though stays active with a walker.  Her 2 daughters are nurses- 1 ER, 1 cardiac and check on her every 2 to 3 days.   She had noted weight gain of 20 pounds over 3 months at her clinic appointment on October 26, 2021.  When she was seen back in June 2023, she denied heart failure symptoms and was at her baseline weight of 228 pounds.  She continued Lasix 40 mg to 60 mg daily.    I saw her in September 12, 2022.  She noted fluid accumulation, bloating, early satiety increased fatigue, increased dyspnea on exertion, chest pressure, increased lower extremity edema and decreased urination. She increased Lasix to 80 mg daily without improvement in urination or symptoms.  She was Given subQ lasix '80mg'$  in the office via furoscix and recommended hospital admission.  Patient declined admission.   She followed up with Coletta Memos on October 18, 2022.  Another subcu Lasix 80 mg was applied.  She was switched from Lasix to torsemide '20mg'$  daily.  Recommended follow-up in 1 week.  Today, ***  Acute on chronic diastolic heart failure -Echo July 2022 with EF 55 to 60%, moderately dilated RV, severely dilated LA, mild mitral stenosis, status post TAVR valve placement, mild to moderate AI  -Hypervolemic on exam with 25 pound weight gain; increased PO Lasix dose has not improved symptoms and has decreased urination -Recommend direct admission to cardiac  telemetry for diuresis - bed control stated bed available tonight vs. tomorrow AM likely -Given subQ lasix '80mg'$  in the office via furoscix -Restart lasix '80mg'$  PO twice daily tomorrow AM until bed available. -Asked daughter to assess diuresis with this subQ lasix dose to help guide if patient will diurese okay on IV Lasix boluses.  If not may require IV Lasix drip. -Check CMET, magnesium, BNP on admission. -Recheck 2D echocardiogram with known aortic regurgitation post TAVR.   Permanent atrial fibrillation -Continue Toprol tartrate 25 mg twice daily for rate control -Continue Eliquis 5 mg twice daily for stroke prevention   -Check CBC on admission with known history of iron deficiency, AVMs and chronic anemia.   Aortic valve disease -Aortic stenosis status post TAVR in 2020 -2D echo in July 2022 showed mild to moderate aortic valve regurgitation  -Check 2D echo   Normocytic normochromic anemia -history of iron deficiency, AVMs  -Check CBC on admission   Dispo -***    (She sleeps with 5 pillows.  She has noted increased PND and orthopnea.  She has had increased her oxygen from 2-1/2 L/min 24 hours a day to 3 or 3-1/2 L/min.  They are concerned about ascites.     Past Medical History:  Diagnosis Date   Anemia    Aortic atherosclerosis ([place]) 04/29/2017   Asthma    Atrial fibrillation, persistent ([place])    AVM (arteriovenous malformation) of duodenum, acquired with hemorrhage 11/22/2020   CAD (coronary artery disease) 2016   non-obstructive  at cath   Chronic diastolic CHF (congestive heart failure) ([facility])    Diabetes mellitus without complication ([facility])    type 2   HLD (hyperlipidemia)    Hypertension    Iron deficiency anemia due to chronic blood loss 11/22/2020   S/P TAVR (transcatheter aortic valve replacement)    Severe aortic stenosis 04/26/2017    Past Surgical History:  Procedure Laterality Date   LEFT HEART CATHETERIZATION WITH CORONARY ANGIOGRAM N/A 09/27/2014    Non-obstructive disease, CRF reduction recommended. Procedure: LEFT HEART CATHETERIZATION WITH CORONARY ANGIOGRAM;  Surgeon: Blane Ohara, MD;  Location: [facility] CATH LAB;  Service: Cardiovascular;  Laterality: N/A;   RIGHT/LEFT HEART CATH AND CORONARY ANGIOGRAPHY N/A 10/30/2018   Procedure: RIGHT/LEFT HEART CATH AND CORONARY ANGIOGRAPHY;  Surgeon: Burnell Blanks, MD;  Location: [place] CV LAB;  Service: Cardiovascular;  Laterality: N/A;   TEE WITHOUT CARDIOVERSION N/A 11/18/2018   Procedure: TRANSESOPHAGEAL ECHOCARDIOGRAM (TEE);  Surgeon: Burnell Blanks, MD;  Location: [place] CV LAB;  Service: Open Heart Surgery;  Laterality: N/A;   TRANSCATHETER AORTIC VALVE REPLACEMENT, TRANSFEMORAL N/A 11/18/2018   Procedure: TRANSCATHETER AORTIC VALVE REPLACEMENT, TRANSFEMORAL;  Surgeon: Burnell Blanks, MD;  Location: [facility] CV LAB;  Service: Open Heart Surgery;  Laterality: N/A;   TUBAL LIGATION      Current Medications: No outpatient medications have been marked as taking for the 11/07/22 encounter (Appointment) with Warren Lacy, PA-C.     Allergies:   Codeine, Epinephrine, Latex, Penicillins, Prednisone, Statins, Norco [hydrocodone-acetaminophen], Lidocaine, and Sulfa antibiotics   Social History   Socioeconomic History   Marital status: Widowed    Spouse name: Not on file   Number of children: 4   Years of education: Not on file   Highest education level: Not on file  Occupational History   Occupation: Retired-Secretary  Tobacco Use   Smoking status: Never   Smokeless tobacco: Never  Vaping Use   Vaping Use: Never used  Substance and Sexual Activity   Alcohol use: No    Alcohol/week: 0.0 standard drinks of alcohol   Drug use: No   Sexual activity: Not on file  Other Topics Concern   Not on file  Social History Narrative   Pt lives alone in [place].    Social Determinants of Health   Financial Resource Strain: Low Risk  (11/16/2021)    Overall Financial Resource Strain (CARDIA)    Difficulty of Paying Living Expenses: Not hard at all  Food Insecurity: No Food Insecurity (11/16/2021)   Hunger Vital Sign    Worried About Running Out of Food in the Last Year: Never true    Ran Out of Food in the Last Year: Never true  Transportation Needs: No Transportation Needs (11/16/2021)   PRAPARE - Hydrologist (Medical): No    Lack of Transportation (Non-Medical): No  Physical Activity: Not on file  Stress: No Stress Concern Present (11/16/2021)   [place]    Feeling of Stress : Not at all  Social Connections: Moderately Isolated (11/16/2021)   Social Connection and Isolation Panel [NHANES]    Frequency of Communication with Friends and Family: More than three times a week    Frequency of Social Gatherings with Friends and Family: Twice a week    Attends Religious Services: More than 4 times per year    Active Member of [employer] or Organizations: No    Attends Archivist  Meetings: Never    Marital Status: Widowed     Family History: The patient's family history includes Heart attack (age of onset: 51) in her brother; Transient ischemic attack in her mother.  ROS:   Please see the history of present illness.     EKGs/Labs/Other Studies Reviewed:    EKG:  The ekg ordered today demonstrates ***  Recent Labs: 06/19/2022: Brain Natriuretic Peptide 604 06/26/2022: Pro B Natriuretic peptide (BNP) 753.0 08/22/2022: ALT 40 10/22/2022: BUN 39; Creatinine, Ser 1.30; Potassium 4.9; Sodium 141 11/05/2022: Hemoglobin 11.5; Platelet Count 213   Recent Lipid Panel Lab Results  Component Value Date/Time   CHOL 219 (H) 09/26/2014 12:30 AM   TRIG 231 (H) 09/26/2014 12:30 AM   HDL 43 09/26/2014 12:30 AM   LDLCALC 130 (H) 09/26/2014 12:30 AM    Physical Exam:    VS:  There were no vitals taken for this visit.   No data found.        Wt Readings from Last 3 Encounters:  11/06/22 255 lb 4 oz (115.8 kg)  10/18/22 262 lb 3.2 oz (118.9 kg)  09/12/22 252 lb (114.3 kg)     GEN: *** Well nourished, well developed in no acute distress HEENT: Normal NECK: No JVD; No carotid bruits CARDIAC: ***RRR, no murmurs, rubs, gallops RESPIRATORY:  Clear to auscultation without rales, wheezing or rhonchi  ABDOMEN: Soft, non-tender, non-distended MUSCULOSKELETAL: No edema SKIN: Warm and dry NEUROLOGIC:  Alert and oriented PSYCHIATRIC:  Normal affect     ASSESSMENT AND PLAN   ***   {Are you ordering a CV Procedure (e.g. stress test, cath, DCCV, TEE, etc)?   Press F2        :[id number]    Medication Adjustments/Labs and Tests Ordered: Current medicines are reviewed at length with the patient today.  Concerns regarding medicines are outlined above.  No orders of the defined types were placed in this encounter.  No orders of the defined types were placed in this encounter.   There are no Patient Instructions on file for this visit.   Signed, Warren Lacy, PA-C  11/06/2022 11:41 AM    [place]

## 2022-11-06 NOTE — Telephone Encounter (Signed)
sent 

## 2022-11-06 NOTE — Patient Instructions (Addendum)
For pain: Take Tylenol once daily Also you can take tramadol 25 mg 1 tablet twice a day as needed. Watch for any confusion or feeling drowsy.  We are referring you to neurology for further evaluation of neuropathy  GO TO THE LAB : Get the blood work     [place], [place] back for a checkup with edward  next month

## 2022-11-06 NOTE — Telephone Encounter (Signed)
Can you resend downstairs please?

## 2022-11-06 NOTE — Addendum Note (Signed)
Addended by: Colon Branch on: 11/06/2022 03:43 PM   Modules accepted: Orders

## 2022-11-06 NOTE — Telephone Encounter (Signed)
Patient's daughter called to advise the [facility] [place] is [facility] of [facility] so she would like to have it sent to [place].

## 2022-11-07 ENCOUNTER — Ambulatory Visit: Payer: Medicare HMO | Attending: Physician Assistant | Admitting: Physician Assistant

## 2022-11-07 ENCOUNTER — Other Ambulatory Visit (HOSPITAL_BASED_OUTPATIENT_CLINIC_OR_DEPARTMENT_OTHER): Payer: Self-pay

## 2022-11-07 DIAGNOSIS — Z952 Presence of prosthetic heart valve: Secondary | ICD-10-CM

## 2022-11-07 DIAGNOSIS — I5033 Acute on chronic diastolic (congestive) heart failure: Secondary | ICD-10-CM

## 2022-11-07 DIAGNOSIS — I4821 Permanent atrial fibrillation: Secondary | ICD-10-CM

## 2022-11-08 ENCOUNTER — Encounter: Payer: Self-pay | Admitting: Family

## 2022-11-08 ENCOUNTER — Telehealth: Payer: Self-pay | Admitting: Medical

## 2022-11-08 NOTE — Telephone Encounter (Signed)
Copied from [place] [id number]. Topic: Medicare AWV >> Nov 08, 2022  1:36 PM Devoria Glassing wrote: Reason for CRM: Called patient to schedule Medicare Annual Wellness Visit (AWV). Left message for patient to call back and schedule Medicare Annual Wellness Visit (AWV).  Last date of AWV: 11/13/2021   Please schedule an appointment at any time with [facility].  If any questions, please contact me.  Thank you ,  Sherol Dade; [place] Direct Dial: [phone]

## 2022-11-09 ENCOUNTER — Other Ambulatory Visit: Payer: Self-pay | Admitting: General Practice

## 2022-11-21 ENCOUNTER — Ambulatory Visit: Payer: Medicare HMO | Admitting: Family

## 2022-11-21 ENCOUNTER — Inpatient Hospital Stay: Payer: Medicare HMO

## 2022-11-23 ENCOUNTER — Other Ambulatory Visit: Payer: Self-pay | Admitting: Medical

## 2022-11-23 NOTE — Telephone Encounter (Signed)
Pt was given rx by Dr. Larose Kells and would like to know if pcp can refill this as it helps with her pain.   traMADol (ULTRAM) 50 MG tablet   [facility] [address] [place], [place] [address] Phone: [phone]  Fax: [phone]

## 2022-11-25 MED ORDER — TRAMADOL HCL 50 MG PO TABS: 25.0000 mg | ORAL_TABLET | Freq: Four times a day (QID) | ORAL | 0 refills | Status: DC | PRN

## 2022-11-25 NOTE — Telephone Encounter (Signed)
I refilled med but she needs to have appointment in 2 weeks or so before she runs out. Need appointment to discuss controlled med contract uds/pain if continue with tramadol.

## 2022-12-12 ENCOUNTER — Other Ambulatory Visit: Payer: Self-pay | Admitting: Medical

## 2022-12-25 ENCOUNTER — Telehealth: Payer: Self-pay

## 2022-12-25 NOTE — Telephone Encounter (Signed)
Patient's daughter called , stated patient is requesting hospice/pallative care as of last night patient is declining , unable to get her out the house and its hard for her to move inside the house.. both daughters are nurses and are trying to keep her afloat , pt has no appetite sometimes and is in constant pain.   Daughter Fabiola Backer wants her number to be the main contact for the hospice referral since her mother doesn't answer the phone    [phone]

## 2022-12-25 NOTE — Addendum Note (Signed)
Addended by: Gwenevere Abbot on: 12/25/2022 12:52 PM   Modules accepted: Orders

## 2022-12-26 ENCOUNTER — Ambulatory Visit: Payer: Medicare HMO | Admitting: Family

## 2022-12-26 ENCOUNTER — Inpatient Hospital Stay: Payer: Medicare HMO

## 2022-12-26 NOTE — Telephone Encounter (Signed)
VO given , made aware forms will be faxed over in the morning

## 2022-12-26 NOTE — Telephone Encounter (Signed)
Ok to give VO? 

## 2022-12-26 NOTE — Telephone Encounter (Signed)
Form placed in red folder  

## 2022-12-26 NOTE — Telephone Encounter (Signed)
Morrie Sheldon from Palliative care called to let PCP know that Hospice care will be more appropriate for the patient and they have faxed over orders to [name]. She stated that the pt's daughter would like Hospice care to start today if possible. Morrie Sheldon also stated she could take verbal orders if unable to sign the form faxed to [name].   Morrie Sheldon [phone] (Verbal order ok)

## 2022-12-26 NOTE — Telephone Encounter (Signed)
Forms placed in red folder for urgent send back

## 2023-01-01 ENCOUNTER — Telehealth: Payer: Self-pay

## 2023-01-01 ENCOUNTER — Encounter: Payer: Self-pay | Admitting: Medical

## 2023-01-01 ENCOUNTER — Encounter: Payer: Self-pay | Admitting: Family

## 2023-01-01 NOTE — Telephone Encounter (Signed)
Caller Name:  Fabiola Backer ( Daughter)  Date of death: 01/24/2023 Place of death: Home // Pallative Care

## 2023-01-02 DEATH — deceased

## 2023-01-18 ENCOUNTER — Other Ambulatory Visit: Payer: Self-pay | Admitting: Medical

## 2023-02-05 ENCOUNTER — Inpatient Hospital Stay: Payer: Medicare HMO

## 2023-02-05 ENCOUNTER — Ambulatory Visit: Payer: Medicare HMO | Admitting: Family
